# Patient Record
Sex: Male | Born: 1950 | Race: White | Hispanic: No | Smoking: Current every day smoker
Health system: Southern US, Community
[De-identification: ages and names within clinical notes are randomized; demographics above are authoritative.]

## PROBLEM LIST (undated history)

## (undated) DIAGNOSIS — C220 Liver cell carcinoma: Secondary | ICD-10-CM

## (undated) DIAGNOSIS — Z85828 Personal history of other malignant neoplasm of skin: Secondary | ICD-10-CM

## (undated) DIAGNOSIS — R32 Unspecified urinary incontinence: Secondary | ICD-10-CM

## (undated) DIAGNOSIS — B1920 Unspecified viral hepatitis C without hepatic coma: Secondary | ICD-10-CM

## (undated) DIAGNOSIS — Z7901 Long term (current) use of anticoagulants: Secondary | ICD-10-CM

## (undated) DIAGNOSIS — R0600 Dyspnea, unspecified: Secondary | ICD-10-CM

## (undated) DIAGNOSIS — I219 Acute myocardial infarction, unspecified: Secondary | ICD-10-CM

## (undated) DIAGNOSIS — I10 Essential (primary) hypertension: Secondary | ICD-10-CM

## (undated) DIAGNOSIS — R600 Localized edema: Secondary | ICD-10-CM

## (undated) DIAGNOSIS — Z955 Presence of coronary angioplasty implant and graft: Secondary | ICD-10-CM

## (undated) DIAGNOSIS — J439 Emphysema, unspecified: Secondary | ICD-10-CM

## (undated) DIAGNOSIS — I2510 Atherosclerotic heart disease of native coronary artery without angina pectoris: Secondary | ICD-10-CM

## (undated) DIAGNOSIS — I252 Old myocardial infarction: Secondary | ICD-10-CM

## (undated) DIAGNOSIS — Z923 Personal history of irradiation: Secondary | ICD-10-CM

## (undated) DIAGNOSIS — R058 Other specified cough: Secondary | ICD-10-CM

## (undated) DIAGNOSIS — Z9981 Dependence on supplemental oxygen: Secondary | ICD-10-CM

## (undated) DIAGNOSIS — R0609 Other forms of dyspnea: Secondary | ICD-10-CM

## (undated) DIAGNOSIS — Z972 Presence of dental prosthetic device (complete) (partial): Secondary | ICD-10-CM

## (undated) DIAGNOSIS — C3492 Malignant neoplasm of unspecified part of left bronchus or lung: Secondary | ICD-10-CM

## (undated) DIAGNOSIS — I77811 Abdominal aortic ectasia: Secondary | ICD-10-CM

## (undated) DIAGNOSIS — R05 Cough: Secondary | ICD-10-CM

## (undated) DIAGNOSIS — Z973 Presence of spectacles and contact lenses: Secondary | ICD-10-CM

## (undated) DIAGNOSIS — E785 Hyperlipidemia, unspecified: Secondary | ICD-10-CM

---

## 2001-12-29 ENCOUNTER — Ambulatory Visit (HOSPITAL_COMMUNITY): Admission: RE | Admit: 2001-12-29 | Discharge: 2001-12-29 | Payer: Self-pay | Admitting: Family Medicine

## 2001-12-29 ENCOUNTER — Encounter: Payer: Self-pay | Admitting: Family Medicine

## 2003-01-20 ENCOUNTER — Ambulatory Visit (HOSPITAL_COMMUNITY): Admission: RE | Admit: 2003-01-20 | Discharge: 2003-01-20 | Payer: Self-pay | Admitting: General Surgery

## 2003-11-11 ENCOUNTER — Ambulatory Visit (HOSPITAL_COMMUNITY): Admission: RE | Admit: 2003-11-11 | Discharge: 2003-11-11 | Payer: Self-pay | Admitting: Internal Medicine

## 2003-11-16 ENCOUNTER — Ambulatory Visit (HOSPITAL_COMMUNITY): Admission: RE | Admit: 2003-11-16 | Discharge: 2003-11-16 | Payer: Self-pay | Admitting: Internal Medicine

## 2010-01-08 ENCOUNTER — Ambulatory Visit (HOSPITAL_COMMUNITY)
Admission: RE | Admit: 2010-01-08 | Discharge: 2010-01-08 | Payer: Self-pay | Source: Home / Self Care | Admitting: Internal Medicine

## 2010-05-06 ENCOUNTER — Encounter: Payer: Self-pay | Admitting: Internal Medicine

## 2010-08-31 NOTE — H&P (Signed)
   NAME:  Adam Bass, HOLTE NO.:  [id number]   MEDICAL RECORD NO.:  [id number]                  PATIENT TYPE:   LOCATION:                                       FACILITY:   PHYSICIAN:  Dalia Heading, M.D.               DATE OF BIRTH:  [birth year]   DATE OF ADMISSION:  DATE OF DISCHARGE:                                HISTORY & PHYSICAL   CHIEF COMPLAINT:  History of adenomatous polyp with dysplasia.   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  He did have a colonoscopy in 2001 which was negative.  He  denies any abdominal pain, weight loss, nausea, vomiting, diarrhea,  constipation, melena or hematochezia.  There is no family history of colon  carcinoma.   PAST MEDICAL HISTORY:  Unremarkable.   PAST SURGICAL HISTORY:  As noted above.   CURRENT MEDICATIONS:  None.   ALLERGIES:  No known drug allergies.   SOCIAL HISTORY:  The patient does smoke a pack of cigarettes a day.  Does  drink alcohol daily.   REVIEW OF SYSTEMS:  No other cardiopulmonary difficulties are noted.   PHYSICAL EXAMINATION:  GENERAL:  Well-developed, well-nourished white male  in no acute distress.  He is afebrile.  VITAL SIGNS:  Stable.  LUNGS:  Clear to auscultation with equal breath sounds bilaterally.  HEART:  Regular rate and rhythm without S3, S4 or murmurs.  ABDOMEN:  Soft, nontender, nondistended.  No hepatosplenomegaly or masses  are noted.  RECTAL:  Deferred until the procedure.   IMPRESSION:  History of colon polyps.    PLAN:  The patient is scheduled for a colonoscopy on January 20, 2003.  The  risks and benefits of the procedure including bleeding and perforation were  explained to the patient and getting informed consent.      ___________________________________________                                         Dalia Heading, M.D.   MAJ/MEDQ  D:  01/13/2003  T:  01/13/2003  Job:  [id number]   cc:   Kirk Ruths, M.D.  [address]  [place]  [place] [address]  Fax: [phone]

## 2010-08-31 NOTE — Procedures (Signed)
NAME:  Adam Bass, Adam Bass                        ACCOUNT NO.:  [id number]   MEDICAL RECORD NO.:  [id number]                   PATIENT TYPE:  OUT   LOCATION:  RAD                                  FACILITY:  [facility]   PHYSICIAN:  Dani Gobble, MD                    DATE OF BIRTH:  [birth year]   DATE OF PROCEDURE:  11/16/2003  DATE OF DISCHARGE:                                  ECHOCARDIOGRAM   REFERRING PHYSICIAN:  1. Madelin Rear. Sherwood Gambler, M.D.  2. Dani Gobble, MD   INDICATIONS:  Mr. Tinnon is a 60 year old gentleman with a history of TIA's  without prior cardiac history.   The technical quality of the study was adequate.   The aorta was within normal limits at 2.7 cm.   The left atrium is within normal limits at 3.7 cm.  No obvious clots or  masses were appreciated.  The patient appeared to be in sinus rhythm during  this procedure.   The intraventricular septum and posterior wall were within normal limits at  1.1 cm for each.   The aortic valve appears thin, trileaflet, and pliable with normal leaflet  excursion.  No significant aortic insufficiency is noted.  Doppler  interrogation of the aortic valve is within normal limits.   Mitral valve also appears structurally normal with trivial mitral  regurgitation noted.  No mitral valve prolapse is noted.  A Doppler  interrogation of the mitral valve is within normal limits.   Pulmonic valve was incompletely visualized.   The tricuspid valve appears grossly structurally normal with trivial  tricuspid regurgitation noted.   The left ventricle is all one size with the LVIDD measured at 4.2 cm, and  LVISD measured at 3.1 cm.  Overall left ventricular systolic function is  normal and no regional wall motion abnormalities are noted.  The right-sided  structures appear normal.   IMPRESSION:  1. Normal chamber sizes.  2. Normal left ventricular size and systolic function without regional wall     motion abnormality noted.  3. Trivial  tricuspid and mitral regurgitation.  4. Essentially normal echocardiogram.      ___________________________________________                                            Dani Gobble, MD   AB/MEDQ  D:  11/16/2003  T:  11/16/2003  Job:  [id number]   cc:   Madelin Rear. Sherwood Gambler, M.D.  [address]  [place]  [place] [address]  Fax: [phone]   Dani Gobble, MD  Fax: [phone]

## 2010-12-31 ENCOUNTER — Other Ambulatory Visit (HOSPITAL_COMMUNITY): Payer: Self-pay | Admitting: Family Medicine

## 2010-12-31 DIAGNOSIS — N400 Enlarged prostate without lower urinary tract symptoms: Secondary | ICD-10-CM

## 2010-12-31 DIAGNOSIS — J449 Chronic obstructive pulmonary disease, unspecified: Secondary | ICD-10-CM

## 2010-12-31 DIAGNOSIS — R945 Abnormal results of liver function studies: Secondary | ICD-10-CM

## 2011-01-04 ENCOUNTER — Ambulatory Visit (HOSPITAL_COMMUNITY)
Admission: RE | Admit: 2011-01-04 | Discharge: 2011-01-04 | Disposition: A | Discharge status: Home / Self Care | Payer: BC Managed Care – PPO | Source: Ambulatory Visit | Attending: Family Medicine | Admitting: Family Medicine

## 2011-01-04 DIAGNOSIS — J4489 Other specified chronic obstructive pulmonary disease: Secondary | ICD-10-CM | POA: Insufficient documentation

## 2011-01-04 DIAGNOSIS — R350 Frequency of micturition: Secondary | ICD-10-CM | POA: Insufficient documentation

## 2011-01-04 DIAGNOSIS — J449 Chronic obstructive pulmonary disease, unspecified: Secondary | ICD-10-CM | POA: Insufficient documentation

## 2011-01-04 DIAGNOSIS — N400 Enlarged prostate without lower urinary tract symptoms: Secondary | ICD-10-CM | POA: Insufficient documentation

## 2011-01-04 DIAGNOSIS — R945 Abnormal results of liver function studies: Secondary | ICD-10-CM | POA: Insufficient documentation

## 2011-05-08 ENCOUNTER — Other Ambulatory Visit (HOSPITAL_COMMUNITY): Payer: Self-pay | Admitting: Internal Medicine

## 2011-05-08 ENCOUNTER — Ambulatory Visit (HOSPITAL_COMMUNITY)
Admission: RE | Admit: 2011-05-08 | Discharge: 2011-05-08 | Disposition: A | Discharge status: Home / Self Care | Payer: BC Managed Care – PPO | Source: Ambulatory Visit | Attending: Internal Medicine | Admitting: Internal Medicine

## 2011-05-08 DIAGNOSIS — J069 Acute upper respiratory infection, unspecified: Secondary | ICD-10-CM

## 2011-05-08 DIAGNOSIS — R059 Cough, unspecified: Secondary | ICD-10-CM

## 2011-05-08 DIAGNOSIS — R05 Cough: Secondary | ICD-10-CM

## 2012-07-15 ENCOUNTER — Encounter (INDEPENDENT_AMBULATORY_CARE_PROVIDER_SITE_OTHER): Payer: Self-pay | Admitting: *Deleted

## 2012-07-21 ENCOUNTER — Other Ambulatory Visit (INDEPENDENT_AMBULATORY_CARE_PROVIDER_SITE_OTHER): Payer: Self-pay | Admitting: *Deleted

## 2012-07-21 ENCOUNTER — Telehealth (INDEPENDENT_AMBULATORY_CARE_PROVIDER_SITE_OTHER): Payer: Self-pay | Admitting: *Deleted

## 2012-07-21 ENCOUNTER — Encounter (INDEPENDENT_AMBULATORY_CARE_PROVIDER_SITE_OTHER): Payer: Self-pay | Admitting: Internal Medicine

## 2012-07-21 ENCOUNTER — Ambulatory Visit (INDEPENDENT_AMBULATORY_CARE_PROVIDER_SITE_OTHER): Payer: BC Managed Care – PPO | Admitting: Internal Medicine

## 2012-07-21 ENCOUNTER — Encounter (INDEPENDENT_AMBULATORY_CARE_PROVIDER_SITE_OTHER): Payer: Self-pay | Admitting: *Deleted

## 2012-07-21 VITALS — BP 126/72 | HR 88 | Ht 69.0 in | Wt 154.8 lb

## 2012-07-21 DIAGNOSIS — J449 Chronic obstructive pulmonary disease, unspecified: Secondary | ICD-10-CM | POA: Insufficient documentation

## 2012-07-21 DIAGNOSIS — Z1211 Encounter for screening for malignant neoplasm of colon: Secondary | ICD-10-CM

## 2012-07-21 NOTE — Telephone Encounter (Signed)
Patient needs movi prep 

## 2012-07-21 NOTE — Progress Notes (Addendum)
Subjective:     Patient ID: Adam Bass, male   DOB: 1951/02/26, 62 y.o.   MRN: [id number]  HPIReferred to our office for diarrhea/colonoscopy. He tells me when he saw Dr. Regino Schultze he had a stomach virus and was having diarrhea.Symptoms resolved after 3 days. His last colonoscopy in 2004 was by Dr. Lovell Sheehan and was normal except for small external hemorrhoids. No polyps or masses.   Appetite is good. No weight loss. No abdominal pain.  He has a BM one a day. No melena or bright red rectal bleeding. No GI problems.  Empirically treated with Cipro and Flagyl. Stool culture negative, Ova and Para negative, C diff negative, Lactoferrin positive.  06/22/2012 H and H 18.0 and 49.3, MCV 100, Platelet ct 288  AST 118, ALT 133. Review of Systems see hpi Current Outpatient Prescriptions  Medication Sig Dispense Refill  . aspirin 81 MG chewable tablet Chew 81 mg by mouth daily.      Marland Kitchen tiotropium (SPIRIVA HANDIHALER) 18 MCG inhalation capsule Place 18 mcg into inhaler and inhale daily.      Marland Kitchen albuterol (PROAIR HFA) 108 (90 BASE) MCG/ACT inhaler Inhale 2 puffs into the lungs every 6 (six) hours as needed for wheezing.       No current facility-administered medications for this visit.   Past Medical History  Diagnosis Date  . COPD (chronic obstructive pulmonary disease)    History reviewed. No pertinent past surgical history. No Known Allergies      Objective:   Physical Exam  Filed Vitals:   07/21/12 1506  BP: 126/72  Pulse: 88  Height: 5\' 9"  (1.753 m)  Weight: 154 lb 12.8 oz (70.217 kg)  Alert and oriented. Skin warm and dry. Oral mucosa is moist.   . Sclera anicteric, conjunctivae is pink. Thyroid not enlarged. No cervical lymphadenopathy. Lungs clear. Heart regular rate and rhythm.  Abdomen is soft. Bowel sounds are positive. No hepatomegaly. No abdominal masses felt. No tenderness.  No edema to lower extremities.        Assessment:   No GI problems. In need of screening  colonoscopy    Plan:   Screening colonoscopy

## 2012-07-21 NOTE — Patient Instructions (Addendum)
Screening colonoscopy 

## 2012-07-22 MED ORDER — PEG-KCL-NACL-NASULF-NA ASC-C 100 G PO SOLR: 1.0000 | Freq: Once | ORAL | Status: DC

## 2012-07-28 ENCOUNTER — Encounter (INDEPENDENT_AMBULATORY_CARE_PROVIDER_SITE_OTHER): Payer: Self-pay

## 2012-08-17 ENCOUNTER — Encounter (HOSPITAL_COMMUNITY): Payer: Self-pay | Admitting: Pharmacy Technician

## 2012-08-27 ENCOUNTER — Encounter (HOSPITAL_COMMUNITY)
Admission: RE | Disposition: A | Discharge status: Home / Self Care | Payer: Self-pay | Source: Ambulatory Visit | Attending: Internal Medicine

## 2012-08-27 ENCOUNTER — Encounter (HOSPITAL_COMMUNITY): Payer: Self-pay | Admitting: *Deleted

## 2012-08-27 ENCOUNTER — Ambulatory Visit (HOSPITAL_COMMUNITY)
Admission: RE | Admit: 2012-08-27 | Discharge: 2012-08-27 | Disposition: A | Discharge status: Home / Self Care | Payer: BC Managed Care – PPO | Source: Ambulatory Visit | Attending: Internal Medicine | Admitting: Internal Medicine

## 2012-08-27 DIAGNOSIS — D126 Benign neoplasm of colon, unspecified: Secondary | ICD-10-CM | POA: Insufficient documentation

## 2012-08-27 DIAGNOSIS — Z1211 Encounter for screening for malignant neoplasm of colon: Secondary | ICD-10-CM

## 2012-08-27 DIAGNOSIS — K5730 Diverticulosis of large intestine without perforation or abscess without bleeding: Secondary | ICD-10-CM

## 2012-08-27 DIAGNOSIS — J449 Chronic obstructive pulmonary disease, unspecified: Secondary | ICD-10-CM | POA: Insufficient documentation

## 2012-08-27 DIAGNOSIS — J4489 Other specified chronic obstructive pulmonary disease: Secondary | ICD-10-CM | POA: Insufficient documentation

## 2012-08-27 HISTORY — PX: COLONOSCOPY: SHX5424

## 2012-08-27 SURGERY — COLONOSCOPY
Anesthesia: Moderate Sedation

## 2012-08-27 MED ORDER — SODIUM CHLORIDE 0.9 % IV SOLN
INTRAVENOUS | Status: DC
  Administered 2012-08-27: 11:00:00 via INTRAVENOUS

## 2012-08-27 MED ORDER — MIDAZOLAM HCL 5 MG/5ML IJ SOLN
INTRAMUSCULAR | Status: AC
  Filled 2012-08-27: qty 10

## 2012-08-27 MED ORDER — STERILE WATER FOR IRRIGATION IR SOLN
Status: DC | PRN
  Administered 2012-08-27: 11:00:00

## 2012-08-27 MED ORDER — MIDAZOLAM HCL 5 MG/5ML IJ SOLN
INTRAMUSCULAR | Status: DC | PRN
  Administered 2012-08-27: 2 mg via INTRAVENOUS
  Administered 2012-08-27: 1 mg via INTRAVENOUS
  Administered 2012-08-27: 2 mg via INTRAVENOUS
  Administered 2012-08-27: 1 mg via INTRAVENOUS

## 2012-08-27 MED ORDER — MEPERIDINE HCL 50 MG/ML IJ SOLN
INTRAMUSCULAR | Status: DC | PRN
  Administered 2012-08-27 (×2): 25 mg via INTRAVENOUS

## 2012-08-27 MED ORDER — MEPERIDINE HCL 50 MG/ML IJ SOLN
INTRAMUSCULAR | Status: AC
  Filled 2012-08-27: qty 1

## 2012-08-27 NOTE — Op Note (Signed)
COLONOSCOPY PROCEDURE REPORT  PATIENT:  Adam Bass  MR#:  [id number] Birthdate:  1950-06-27, 62 y.o., male Endoscopist:  Dr. Malissa Hippo, MD Referred By:  Dr. Kirk Ruths, MD Procedure Date: 08/27/2012  Procedure:   Colonoscopy with snare polypectomy.  Indications:  Patient is 62 year-old Caucasian male who is here for average risk screening colonoscopy.  Informed Consent:  The procedure and risks were reviewed with the patient and informed consent was obtained.  Medications:  Demerol 50 mg IV Versed 6 mg IV  Description of procedure:  After a digital rectal exam was performed, that colonoscope was advanced from the anus through the rectum and colon to the area of the cecum, ileocecal valve and appendiceal orifice. The cecum was deeply intubated. These structures were well-seen and photographed for the record. From the level of the cecum and ileocecal valve, the scope was slowly and cautiously withdrawn. The mucosal surfaces were carefully surveyed utilizing scope tip to flexion to facilitate fold flattening as needed. The scope was pulled down into the rectum where a thorough exam including retroflexion was performed.  Findings:   Prep excellent. Three small polyps cold snare from proximal sigmoid colon. Two were retrieved and one was lost. 7 mm sessile polyp snared from distal sigmoid colon. Few small diverticula at sigmoid colon. Normal mucosa of rectum and anorectal junction.   Therapeutic/Diagnostic Maneuvers Performed:  See above  Complications:  None  Cecal Withdrawal Time:  22 minutes  Impression:  Examination performed to cecum. Few small diverticula at sigmoid colon. Three small polyps cold snare from proximal sigmoid colon. Two were retrieved and submitted together; third one was lost. 7 mm Sessile polyp snared from distal colon.  Recommendations:  Standard instructions given. I will contact patient with biopsy results and further  recommendations.  Ming Mcmannis U  08/27/2012 12:08 PM  CC: Dr. Kirk Ruths, MD & Dr. Bonnetta Barry ref. provider found

## 2012-08-27 NOTE — H&P (Signed)
Adam Bass is an 62 y.o. male.   Chief Complaint: Patient is here for colonoscopy. HPI: Patient 62 year old Caucasian male who is here for screening colonoscopy. Patient denies abdominal pain change in bowel habits or rectal bleeding. His last colonoscopy was over 10 years ago. Family history is negative for carcinoma. Past Medical History  Diagnosis Date  . COPD (chronic obstructive pulmonary disease)     Past Surgical History  Procedure Laterality Date  . Colonoscopy  2004    Dr. Jola Schmidt    Family History  Problem Relation Age of Onset  . Breast cancer Sister   . Colon cancer Neg Hx    Social History:  reports that he has been smoking Cigars.  He does not have any smokeless tobacco history on file. He reports that  drinks alcohol. He reports that he does not use illicit drugs.  Allergies:  Allergies  Allergen Reactions  . Bee Venom     Medications Prior to Admission  Medication Sig Dispense Refill  . albuterol (PROAIR HFA) 108 (90 BASE) MCG/ACT inhaler Inhale 2 puffs into the lungs every 6 (six) hours as needed for wheezing.      Marland Kitchen aspirin 81 MG chewable tablet Chew 81 mg by mouth daily.      . peg 3350 powder (MOVIPREP) 100 G SOLR Take 1 kit (100 g total) by mouth once.  1 kit  0  . tiotropium (SPIRIVA HANDIHALER) 18 MCG inhalation capsule Place 18 mcg into inhaler and inhale daily.        No results found for this or any previous visit (from the past 48 hour(s)). No results found.  ROS  Blood pressure 139/89, temperature 97.8 F (36.6 C), temperature source Oral, resp. rate 19, height 5\' 9"  (1.753 m), weight 150 lb (68.04 kg), SpO2 96.00%. Physical Exam  Constitutional: He appears well-developed and well-nourished.  HENT:  Mouth/Throat: Oropharynx is clear and moist.  Eyes: Conjunctivae are normal. No scleral icterus.  Neck: No thyromegaly present.  Cardiovascular: Normal rate, regular rhythm and normal heart sounds.   No murmur heard. Respiratory:  Effort normal and breath sounds normal.  GI: Soft. He exhibits no distension and no mass. There is no tenderness.  Musculoskeletal: He exhibits no edema.  Lymphadenopathy:    He has no cervical adenopathy.  Neurological: He is alert.  Skin: Skin is warm and dry.     Assessment/Plan Average risk screening colonoscopy.  Divit Stipp U 08/27/2012, 11:19 AM

## 2012-08-31 ENCOUNTER — Encounter (HOSPITAL_COMMUNITY): Payer: Self-pay | Admitting: Internal Medicine

## 2012-09-10 ENCOUNTER — Encounter (INDEPENDENT_AMBULATORY_CARE_PROVIDER_SITE_OTHER): Payer: Self-pay | Admitting: *Deleted

## 2014-04-11 ENCOUNTER — Other Ambulatory Visit (HOSPITAL_COMMUNITY): Payer: Self-pay | Admitting: Respiratory Therapy

## 2014-04-11 DIAGNOSIS — J449 Chronic obstructive pulmonary disease, unspecified: Secondary | ICD-10-CM

## 2014-04-20 ENCOUNTER — Ambulatory Visit (HOSPITAL_COMMUNITY): Admission: RE | Admit: 2014-04-20 | Payer: Self-pay | Source: Ambulatory Visit

## 2014-04-21 ENCOUNTER — Ambulatory Visit (HOSPITAL_COMMUNITY)
Admission: RE | Admit: 2014-04-21 | Discharge: 2014-04-21 | Disposition: A | Discharge status: Home / Self Care | Payer: BLUE CROSS/BLUE SHIELD | Source: Ambulatory Visit | Attending: Family Medicine | Admitting: Family Medicine

## 2014-04-21 ENCOUNTER — Other Ambulatory Visit (HOSPITAL_COMMUNITY): Payer: Self-pay | Admitting: Family Medicine

## 2014-04-21 DIAGNOSIS — R059 Cough, unspecified: Secondary | ICD-10-CM

## 2014-04-21 DIAGNOSIS — R05 Cough: Secondary | ICD-10-CM | POA: Diagnosis present

## 2014-04-21 DIAGNOSIS — I700 Atherosclerosis of aorta: Secondary | ICD-10-CM | POA: Diagnosis not present

## 2014-04-21 DIAGNOSIS — J984 Other disorders of lung: Secondary | ICD-10-CM | POA: Insufficient documentation

## 2014-04-21 DIAGNOSIS — J019 Acute sinusitis, unspecified: Secondary | ICD-10-CM

## 2014-11-25 ENCOUNTER — Other Ambulatory Visit (HOSPITAL_COMMUNITY): Payer: Self-pay | Admitting: Physician Assistant

## 2014-11-25 DIAGNOSIS — R6889 Other general symptoms and signs: Secondary | ICD-10-CM

## 2014-11-25 DIAGNOSIS — R748 Abnormal levels of other serum enzymes: Secondary | ICD-10-CM

## 2014-11-25 DIAGNOSIS — K7290 Hepatic failure, unspecified without coma: Secondary | ICD-10-CM

## 2014-11-29 ENCOUNTER — Other Ambulatory Visit (HOSPITAL_COMMUNITY): Payer: BLUE CROSS/BLUE SHIELD

## 2014-11-30 ENCOUNTER — Ambulatory Visit (HOSPITAL_COMMUNITY)
Admission: RE | Admit: 2014-11-30 | Discharge: 2014-11-30 | Disposition: A | Discharge status: Home / Self Care | Payer: BLUE CROSS/BLUE SHIELD | Source: Ambulatory Visit | Attending: Physician Assistant | Admitting: Physician Assistant

## 2014-11-30 DIAGNOSIS — K7290 Hepatic failure, unspecified without coma: Secondary | ICD-10-CM

## 2014-11-30 DIAGNOSIS — N281 Cyst of kidney, acquired: Secondary | ICD-10-CM | POA: Insufficient documentation

## 2014-11-30 DIAGNOSIS — I2510 Atherosclerotic heart disease of native coronary artery without angina pectoris: Secondary | ICD-10-CM | POA: Diagnosis not present

## 2014-11-30 DIAGNOSIS — K7460 Unspecified cirrhosis of liver: Secondary | ICD-10-CM | POA: Diagnosis not present

## 2014-11-30 DIAGNOSIS — K760 Fatty (change of) liver, not elsewhere classified: Secondary | ICD-10-CM | POA: Diagnosis not present

## 2014-11-30 DIAGNOSIS — R748 Abnormal levels of other serum enzymes: Secondary | ICD-10-CM | POA: Diagnosis present

## 2014-11-30 DIAGNOSIS — R6889 Other general symptoms and signs: Secondary | ICD-10-CM

## 2014-11-30 MED ORDER — IOHEXOL 300 MG/ML  SOLN
100.0000 mL | Freq: Once | INTRAMUSCULAR | Status: AC | PRN
  Administered 2014-11-30: 100 mL via INTRAVENOUS

## 2014-12-14 ENCOUNTER — Other Ambulatory Visit (HOSPITAL_COMMUNITY): Payer: Self-pay | Admitting: Physician Assistant

## 2014-12-14 DIAGNOSIS — K760 Fatty (change of) liver, not elsewhere classified: Secondary | ICD-10-CM

## 2014-12-14 DIAGNOSIS — R6889 Other general symptoms and signs: Secondary | ICD-10-CM

## 2014-12-14 DIAGNOSIS — R748 Abnormal levels of other serum enzymes: Secondary | ICD-10-CM

## 2014-12-16 ENCOUNTER — Other Ambulatory Visit (HOSPITAL_COMMUNITY): Payer: BLUE CROSS/BLUE SHIELD

## 2014-12-27 ENCOUNTER — Other Ambulatory Visit (HOSPITAL_COMMUNITY): Payer: BLUE CROSS/BLUE SHIELD

## 2014-12-28 ENCOUNTER — Ambulatory Visit (HOSPITAL_COMMUNITY)
Admission: RE | Admit: 2014-12-28 | Discharge: 2014-12-28 | Disposition: A | Discharge status: Home / Self Care | Payer: BLUE CROSS/BLUE SHIELD | Source: Ambulatory Visit | Attending: Physician Assistant | Admitting: Physician Assistant

## 2014-12-28 DIAGNOSIS — K862 Cyst of pancreas: Secondary | ICD-10-CM | POA: Insufficient documentation

## 2014-12-28 DIAGNOSIS — K760 Fatty (change of) liver, not elsewhere classified: Secondary | ICD-10-CM

## 2014-12-28 DIAGNOSIS — N281 Cyst of kidney, acquired: Secondary | ICD-10-CM | POA: Insufficient documentation

## 2014-12-28 DIAGNOSIS — R934 Abnormal findings on diagnostic imaging of urinary organs: Secondary | ICD-10-CM | POA: Insufficient documentation

## 2014-12-28 DIAGNOSIS — R932 Abnormal findings on diagnostic imaging of liver and biliary tract: Secondary | ICD-10-CM | POA: Diagnosis present

## 2014-12-28 LAB — POCT I-STAT CREATININE: Creatinine, Ser: 0.8 mg/dL (ref 0.61–1.24)

## 2014-12-28 MED ORDER — GADOXETATE DISODIUM 0.25 MMOL/ML IV SOLN
7.0000 mL | Freq: Once | INTRAVENOUS | Status: AC | PRN
  Administered 2014-12-28: 7 mL via INTRAVENOUS

## 2016-05-02 ENCOUNTER — Other Ambulatory Visit (HOSPITAL_COMMUNITY): Payer: Self-pay | Admitting: Family Medicine

## 2016-05-02 DIAGNOSIS — K8689 Other specified diseases of pancreas: Secondary | ICD-10-CM

## 2016-05-02 DIAGNOSIS — R935 Abnormal findings on diagnostic imaging of other abdominal regions, including retroperitoneum: Secondary | ICD-10-CM

## 2016-09-02 ENCOUNTER — Ambulatory Visit (HOSPITAL_COMMUNITY)
Admission: RE | Admit: 2016-09-02 | Discharge: 2016-09-02 | Disposition: A | Discharge status: Home / Self Care | Payer: BLUE CROSS/BLUE SHIELD | Source: Ambulatory Visit | Attending: Pulmonary Disease | Admitting: Pulmonary Disease

## 2016-09-02 ENCOUNTER — Other Ambulatory Visit (HOSPITAL_COMMUNITY): Payer: Self-pay | Admitting: Pulmonary Disease

## 2016-09-02 DIAGNOSIS — R918 Other nonspecific abnormal finding of lung field: Secondary | ICD-10-CM | POA: Insufficient documentation

## 2016-09-02 DIAGNOSIS — R05 Cough: Secondary | ICD-10-CM | POA: Insufficient documentation

## 2016-09-02 DIAGNOSIS — R059 Cough, unspecified: Secondary | ICD-10-CM

## 2016-09-04 ENCOUNTER — Other Ambulatory Visit (HOSPITAL_COMMUNITY): Payer: Self-pay | Admitting: Pulmonary Disease

## 2016-09-04 DIAGNOSIS — R918 Other nonspecific abnormal finding of lung field: Secondary | ICD-10-CM

## 2016-09-05 ENCOUNTER — Ambulatory Visit (HOSPITAL_COMMUNITY)
Admission: RE | Admit: 2016-09-05 | Discharge: 2016-09-05 | Disposition: A | Discharge status: Home / Self Care | Payer: BLUE CROSS/BLUE SHIELD | Source: Ambulatory Visit | Attending: Pulmonary Disease | Admitting: Pulmonary Disease

## 2016-09-05 DIAGNOSIS — J439 Emphysema, unspecified: Secondary | ICD-10-CM | POA: Diagnosis not present

## 2016-09-05 DIAGNOSIS — R918 Other nonspecific abnormal finding of lung field: Secondary | ICD-10-CM | POA: Insufficient documentation

## 2016-09-05 DIAGNOSIS — N281 Cyst of kidney, acquired: Secondary | ICD-10-CM | POA: Diagnosis not present

## 2016-09-19 ENCOUNTER — Other Ambulatory Visit (HOSPITAL_COMMUNITY): Payer: Self-pay | Admitting: Pulmonary Disease

## 2016-09-19 DIAGNOSIS — R918 Other nonspecific abnormal finding of lung field: Secondary | ICD-10-CM

## 2016-09-25 ENCOUNTER — Encounter (HOSPITAL_COMMUNITY): Payer: BLUE CROSS/BLUE SHIELD

## 2016-09-27 ENCOUNTER — Encounter (HOSPITAL_COMMUNITY): Payer: Self-pay

## 2016-09-27 ENCOUNTER — Ambulatory Visit (HOSPITAL_COMMUNITY)
Admission: RE | Admit: 2016-09-27 | Discharge: 2016-09-27 | Disposition: A | Discharge status: Home / Self Care | Payer: BLUE CROSS/BLUE SHIELD | Source: Ambulatory Visit | Attending: Pulmonary Disease | Admitting: Pulmonary Disease

## 2016-09-27 DIAGNOSIS — R918 Other nonspecific abnormal finding of lung field: Secondary | ICD-10-CM

## 2016-10-02 ENCOUNTER — Ambulatory Visit (HOSPITAL_COMMUNITY)
Admission: RE | Admit: 2016-10-02 | Discharge: 2016-10-02 | Disposition: A | Discharge status: Home / Self Care | Payer: BLUE CROSS/BLUE SHIELD | Source: Ambulatory Visit | Attending: Pulmonary Disease | Admitting: Pulmonary Disease

## 2016-10-02 DIAGNOSIS — R918 Other nonspecific abnormal finding of lung field: Secondary | ICD-10-CM | POA: Diagnosis not present

## 2016-10-02 DIAGNOSIS — R932 Abnormal findings on diagnostic imaging of liver and biliary tract: Secondary | ICD-10-CM | POA: Insufficient documentation

## 2016-10-02 DIAGNOSIS — I723 Aneurysm of iliac artery: Secondary | ICD-10-CM | POA: Diagnosis not present

## 2016-10-02 DIAGNOSIS — I2510 Atherosclerotic heart disease of native coronary artery without angina pectoris: Secondary | ICD-10-CM | POA: Diagnosis not present

## 2016-10-02 DIAGNOSIS — I700 Atherosclerosis of aorta: Secondary | ICD-10-CM | POA: Insufficient documentation

## 2016-10-02 LAB — GLUCOSE, CAPILLARY: Glucose-Capillary: 112 mg/dL — ABNORMAL HIGH (ref 65–99)

## 2016-10-02 MED ORDER — FLUDEOXYGLUCOSE F - 18 (FDG) INJECTION
7.7200 | Freq: Once | INTRAVENOUS | Status: AC | PRN
  Administered 2016-10-02: 7.72 via INTRAVENOUS

## 2016-10-03 ENCOUNTER — Other Ambulatory Visit (HOSPITAL_COMMUNITY): Payer: Self-pay | Admitting: Pulmonary Disease

## 2016-10-03 DIAGNOSIS — R918 Other nonspecific abnormal finding of lung field: Secondary | ICD-10-CM

## 2016-10-17 ENCOUNTER — Other Ambulatory Visit: Payer: Self-pay | Admitting: Radiology

## 2016-10-18 ENCOUNTER — Ambulatory Visit (HOSPITAL_COMMUNITY)
Admission: RE | Admit: 2016-10-18 | Discharge: 2016-10-18 | Disposition: A | Discharge status: Home / Self Care | Payer: BLUE CROSS/BLUE SHIELD | Source: Ambulatory Visit | Attending: Pulmonary Disease | Admitting: Pulmonary Disease

## 2016-10-18 ENCOUNTER — Ambulatory Visit (HOSPITAL_COMMUNITY)
Admission: RE | Admit: 2016-10-18 | Discharge: 2016-10-18 | Disposition: A | Discharge status: Home / Self Care | Payer: BLUE CROSS/BLUE SHIELD | Source: Ambulatory Visit | Attending: Interventional Radiology | Admitting: Interventional Radiology

## 2016-10-18 ENCOUNTER — Encounter (HOSPITAL_COMMUNITY): Payer: Self-pay

## 2016-10-18 DIAGNOSIS — J95811 Postprocedural pneumothorax: Secondary | ICD-10-CM | POA: Insufficient documentation

## 2016-10-18 DIAGNOSIS — F17210 Nicotine dependence, cigarettes, uncomplicated: Secondary | ICD-10-CM | POA: Diagnosis not present

## 2016-10-18 DIAGNOSIS — J449 Chronic obstructive pulmonary disease, unspecified: Secondary | ICD-10-CM | POA: Insufficient documentation

## 2016-10-18 DIAGNOSIS — Z9889 Other specified postprocedural states: Secondary | ICD-10-CM

## 2016-10-18 DIAGNOSIS — Z7982 Long term (current) use of aspirin: Secondary | ICD-10-CM | POA: Diagnosis not present

## 2016-10-18 DIAGNOSIS — Z79899 Other long term (current) drug therapy: Secondary | ICD-10-CM | POA: Diagnosis not present

## 2016-10-18 DIAGNOSIS — R918 Other nonspecific abnormal finding of lung field: Secondary | ICD-10-CM | POA: Insufficient documentation

## 2016-10-18 DIAGNOSIS — J939 Pneumothorax, unspecified: Secondary | ICD-10-CM

## 2016-10-18 LAB — CBC
HEMATOCRIT: 47.3 % (ref 39.0–52.0)
Hemoglobin: 16.8 g/dL (ref 13.0–17.0)
MCH: 37.3 pg — AB (ref 26.0–34.0)
MCHC: 35.5 g/dL (ref 30.0–36.0)
MCV: 105.1 fL — AB (ref 78.0–100.0)
PLATELETS: 231 10*3/uL (ref 150–400)
RBC: 4.5 MIL/uL (ref 4.22–5.81)
RDW: 12 % (ref 11.5–15.5)
WBC: 9.9 10*3/uL (ref 4.0–10.5)

## 2016-10-18 LAB — APTT: aPTT: 30 seconds (ref 24–36)

## 2016-10-18 LAB — PROTIME-INR
INR: 0.99
Prothrombin Time: 13.1 seconds (ref 11.4–15.2)

## 2016-10-18 MED ORDER — MIDAZOLAM HCL 2 MG/2ML IJ SOLN
INTRAMUSCULAR | Status: AC
  Filled 2016-10-18: qty 6

## 2016-10-18 MED ORDER — FENTANYL CITRATE (PF) 100 MCG/2ML IJ SOLN
INTRAMUSCULAR | Status: AC
  Filled 2016-10-18: qty 4

## 2016-10-18 MED ORDER — LIDOCAINE HCL (PF) 1 % IJ SOLN
INTRAMUSCULAR | Status: AC
  Filled 2016-10-18: qty 30

## 2016-10-18 MED ORDER — SODIUM CHLORIDE 0.9 % IV SOLN: INTRAVENOUS | Status: DC

## 2016-10-18 MED ORDER — FLUMAZENIL 0.5 MG/5ML IV SOLN
INTRAVENOUS | Status: AC
  Filled 2016-10-18: qty 5

## 2016-10-18 MED ORDER — MIDAZOLAM HCL 2 MG/2ML IJ SOLN
INTRAMUSCULAR | Status: AC | PRN
  Administered 2016-10-18: 0.5 mg via INTRAVENOUS
  Administered 2016-10-18 (×3): 1 mg via INTRAVENOUS

## 2016-10-18 MED ORDER — FENTANYL CITRATE (PF) 100 MCG/2ML IJ SOLN
INTRAMUSCULAR | Status: AC | PRN
  Administered 2016-10-18 (×2): 50 ug via INTRAVENOUS

## 2016-10-18 MED ORDER — NALOXONE HCL 0.4 MG/ML IJ SOLN
INTRAMUSCULAR | Status: AC
  Filled 2016-10-18: qty 1

## 2016-10-18 MED ORDER — HYDROCODONE-ACETAMINOPHEN 5-325 MG PO TABS
1.0000 | ORAL_TABLET | ORAL | Status: DC | PRN
  Filled 2016-10-18: qty 2

## 2016-10-18 NOTE — Procedures (Signed)
CT core biopsy LUL lung lesion 18g x2 to surg path No complication No blood loss. See complete dictation in [facility].  Dillard Cannon MD Main # [phone] Pager  [phone]

## 2016-10-18 NOTE — Discharge Instructions (Signed)
°  CALL [phone] IF ANY PROBLEMS,QUESTIONS, OR CONCERNS  Needle Biopsy of the Lung, Care After This sheet gives you information about how to care for yourself after your procedure. Your health care provider may also give you more specific instructions. If you have problems or questions, contact your health care provider. What can I expect after the procedure? After the procedure, it is common to have:  Soreness, pain, and tenderness where a tissue sample was taken (biopsy site).  A cough.  A sore throat.  Follow these instructions at home: Biopsy site care  Follow instructions from your health care provider about when to remove the bandage that was placed on the biopsy site.  Keep the bandage dry until it has been removed.  Check your biopsy site every day for signs of infection. Check for: ? More redness, swelling, or pain. ? More fluid or blood. ? Warmth to the touch. ? Pus or a bad smell. General instructions  Rest as directed by your health care provider. Ask your health care provider what activities are safe for you.  Do not take baths, swim, or use a hot tub until your health care provider approves.  Take over-the-counter and prescription medicines only as told by your health care provider.  If you have airplane travel scheduled, talk with your health care provider about when it is safe for you to travel by airplane.  It is up to you to get the results of your procedure. Ask your health care provider, or the department that is doing the procedure, when your results will be ready.  Keep all follow-up visits as told by your health care provider. This is important. Contact a health care provider if:  You have more redness, swelling, or pain around your biopsy site.  You have more fluid or blood coming from your biopsy site.  Your biopsy site feels warm to the touch.  You have pus or a bad smell coming from your biopsy site.  You have a fever.  You have pain  that does not get better with medicine. Get help right away if:  You have problems breathing.  You have chest pain.  You cough up blood.  You faint.  You have a fast heart rate. Summary  After a needle biopsy of the lung, it is common to have a cough, a sore throat, or soreness, pain, and tenderness where a tissue sample was taken (biopsy site).  You should check your biopsy area every day for signs of infection, including pus or a bad smell, warmth, more fluid or blood, or more redness, swelling, or pain.  You should not take baths, swim, or use a hot tub until your health care provider approves.  It is up to you to get the results of your procedure. Ask your health care provider, or the department that is doing the procedure, when your results will be ready. This information is not intended to replace advice given to you by your health care provider. Make sure you discuss any questions you have with your health care provider. Document Released: 01/27/2007 Document Revised: 02/21/2016 Document Reviewed: 02/21/2016 Elsevier Interactive Patient Education  2017 [employer].

## 2016-10-18 NOTE — Progress Notes (Signed)
Patient with PTX after lung bx today.  This was small and has remained stable for 3 hours post biopsy.  The patient is having no more SOB now than what he normally has from his COPD.  I have discussed with Dr. Vernard Gambles and he agrees the patient is stable to be discharged home.  He is encouraged to present to the ED if he develops worsening pain or SOB.  He and his daughter understand.  Yaniel Limbaugh E 4:05 PM 10/18/2016

## 2016-10-18 NOTE — Progress Notes (Signed)
Called Dr Vernard Gambles about CXR results and per Dr Vernard Gambles OK to discharge at 1420 if no chest pain,shortness of breath or decreased O2 sats

## 2016-10-18 NOTE — Progress Notes (Signed)
Client dressed and walked to bathroom and c/o being short of breath; Dr Vernard Gambles notified and order noted

## 2016-10-18 NOTE — H&P (Signed)
Chief Complaint: Patient was seen in consultation today for left lung mass biopsy at the request of Hawkins,Edward  Referring Physician(s): Hawkins,Edward  Supervising Physician: Corrie Mckusick  Patient Status: [facility]  History of Present Illness: Adam Bass is a 66 y.o. male   ++ smoker Hx COPD Was seen by PMD for URI symptoms and CXR revealed left lung finding CT and PET performed CT: IMPRESSION: Somewhat spiculated nodular area in the left upper lobe which corresponds with that seen on recent chest x-ray. Given its size and spiculated appearance pulmonary neoplasm must be considered. PET: IMPRESSION: 1. Hypermetabolic left upper and left lower lobe nodules, most indicative of synchronous bronchogenic carcinomas. No evidence of metastatic disease. 2. Aortic atherosclerosis (ICD10-170.0). Coronary artery calcification. Right common iliac artery aneurysm. 3. Liver margin is slightly irregular, raising suspicion for cirrhosis. 4. There may be sludge in the gallbladder.  Now scheduled for biopsy of LUL mass biopsy  Past Medical History:  Diagnosis Date  . COPD (chronic obstructive pulmonary disease) ([place])     Past Surgical History:  Procedure Laterality Date  . COLONOSCOPY  2004   Dr. Truett Perna  . COLONOSCOPY N/A 08/27/2012   Procedure: COLONOSCOPY;  Surgeon: Rogene Houston, MD;  Location: [facility];  Service: Endoscopy;  Laterality: N/A;  1200    Allergies: Bee venom  Medications: Prior to Admission medications   Medication Sig Start Date End Date Taking? Authorizing Provider  albuterol (PROAIR HFA) 108 (90 BASE) MCG/ACT inhaler Inhale 2 puffs into the lungs every 6 (six) hours as needed for wheezing.   Yes [provider]  albuterol (PROVENTIL) 4 MG tablet Take 1 tablet by mouth 2 (two) times daily. 10/09/16  Yes [provider]  ANORO ELLIPTA 62.5-25 MCG/INH AEPB Inhale 1 puff into the lungs at bedtime. 10/10/16  Yes  [provider]  aspirin EC 81 MG tablet Take 81 mg by mouth daily.   Yes [provider]  montelukast (SINGULAIR) 10 MG tablet Take 10 mg by mouth at bedtime.   Yes [provider]  Multiple Vitamins-Minerals (MULTIVITAMIN WITH MINERALS) tablet Take 1 tablet by mouth daily.   Yes [provider]  pantoprazole (PROTONIX) 40 MG tablet Take 1 tablet by mouth every morning. 10/08/16  Yes [provider]     Family History  Problem Relation Age of Onset  . Breast cancer Sister   . Colon cancer Neg Hx     Social History   Social History  . Marital status: Divorced    Spouse name: N/A  . Number of children: N/A  . Years of education: N/A   Social History Main Topics  . Smoking status: Current Every Day Smoker    Packs/day: 1.00    Years: 45.00    Types: Cigars  . Smokeless tobacco: None     Comment: 1 pack of cigars a day  . Alcohol use Yes     Comment: 12-18 beers a week, sometimes more and sometimes less  . Drug use: No  . Sexual activity: Not Asked   Other Topics Concern  . None   Social History Narrative  . None    Review of Systems: A 12 point ROS discussed and pertinent positives are indicated in the HPI above.  All other systems are negative.  Review of Systems  Constitutional: Negative for activity change, appetite change, fatigue and fever.  Respiratory: Positive for cough and shortness of breath.   Cardiovascular: Negative for chest pain.  Gastrointestinal: Negative for abdominal pain.  Musculoskeletal: Negative for back pain.  Neurological: Negative for weakness.  Psychiatric/Behavioral: Negative for behavioral problems and confusion.    Vital Signs: BP (!) 154/72   Pulse 83   Temp 98.1 F (36.7 C)   Resp 18   Ht 5\' 9"  (1.753 m)   Wt 145 lb (65.8 kg)   SpO2 98%   BMI 21.41 kg/m   Physical Exam  Constitutional: He is oriented to person, place, and time.  Cardiovascular: Normal rate, regular rhythm and  normal heart sounds.   Pulmonary/Chest: Effort normal and breath sounds normal.  Abdominal: Soft. Bowel sounds are normal.  Musculoskeletal: Normal range of motion.  Neurological: He is alert and oriented to person, place, and time.  Skin: Skin is warm and dry.  Psychiatric: He has a normal mood and affect. His behavior is normal. Judgment and thought content normal.  Nursing note and vitals reviewed.   Mallampati Score:  MD Evaluation Airway: WNL Heart: WNL Abdomen: WNL Chest/ Lungs: WNL ASA  Classification: 3 Mallampati/Airway Score: One  Imaging: Nm Pet Image Initial (pi) Skull Base To Thigh  Result Date: 10/02/2016 CLINICAL DATA:  Initial treatment strategy for pulmonary nodule. EXAM: NUCLEAR MEDICINE PET SKULL BASE TO THIGH TECHNIQUE: 7.7 mCi F-18 FDG was injected intravenously. Full-ring PET imaging was performed from the skull base to thigh after the radiotracer. CT data was obtained and used for attenuation correction and anatomic localization. FASTING BLOOD GLUCOSE:  Value: The 112 mg/dl COMPARISON:  CT chest 09/05/2016, MR abdomen 12/28/2014 and CT abdomen 11/30/2014. FINDINGS: NECK No hypermetabolic lymph nodes in the neck. CT images show no acute findings. CHEST No hypermetabolic mediastinal, hilar or axillary lymph nodes. Spiculated nodule in the apical left upper lobe measures 1.9 cm with an SUV max left 4.5. 8 mm nodule in the left lower lobe (series 7, image 52), has an SUV max of 1.7. Atherosclerotic calcification of the arterial vasculature, including coronary arteries. No pericardial or pleural effusion. ABDOMEN/PELVIS No abnormal hypermetabolism in the liver, adrenal glands, spleen or pancreas. No hypermetabolic lymph nodes. Liver margin is slightly irregular. There may be sludge in the gallbladder. Adrenal glands are unremarkable. Low and high attenuation lesions in the kidneys, better characterized on 12/28/2014. Spleen, pancreas, stomach and bowel are grossly  unremarkable. Atherosclerotic calcification of the arterial vasculature without abdominal aortic aneurysm. Right common iliac artery measures 2.1 cm. No free fluid. SKELETON No abnormal osseous hypermetabolism. IMPRESSION: 1. Hypermetabolic left upper and left lower lobe nodules, most indicative of synchronous bronchogenic carcinomas. No evidence of metastatic disease. 2. Aortic atherosclerosis (ICD10-170.0). Coronary artery calcification. Right common iliac artery aneurysm. 3. Liver margin is slightly irregular, raising suspicion for cirrhosis. 4. There may be sludge in the gallbladder. Electronically Signed   By: Lorin Picket M.D.   On: 10/02/2016 14:08    Labs:  CBC:  Recent Labs  10/18/16 0946  WBC 9.9  HGB 16.8  HCT 47.3  PLT 231    COAGS: No results for input(s): INR, APTT in the last 8760 hours.  BMP: No results for input(s): NA, K, CL, CO2, GLUCOSE, BUN, CALCIUM, CREATININE, GFRNONAA, GFRAA in the last 8760 hours.  Invalid input(s): CMP  LIVER FUNCTION TESTS: No results for input(s): BILITOT, AST, ALT, ALKPHOS, PROT, ALBUMIN in the last 8760 hours.  TUMOR MARKERS: No results for input(s): AFPTM, CEA, CA199, CHROMGRNA in the last 8760 hours.  Assessment and Plan:  Left lung mass  Hx COPD; CXR revealed abnormal finding CT  and PET + Now scheduled for LUL mass biopsy Risks and Benefits discussed with the patient including, but not limited to bleeding, hemoptysis, respiratory failure requiring intubation, infection, pneumothorax requiring chest tube placement, stroke from air embolism or even death. All of the patient's questions were answered, patient is agreeable to proceed. Consent signed and in chart.   Thank you for this interesting consult.  I greatly enjoyed meeting Adam Bass and look forward to participating in their care.  A copy of this report was sent to the requesting provider on this date.  Electronically Signed: Lavonia Drafts, PA-C 10/18/2016,  10:03 AM   I spent a total of  30 Minutes   in face to face in clinical consultation, greater than 50% of which was counseling/coordinating care for left lung mass bx

## 2016-10-18 NOTE — Progress Notes (Signed)
Dr Vernard Gambles notified of cxr results and he will be in to see client

## 2016-10-18 NOTE — Progress Notes (Signed)
Adam Bass, [place] in and OK to d/c home

## 2017-01-29 ENCOUNTER — Other Ambulatory Visit (HOSPITAL_COMMUNITY): Payer: Self-pay | Admitting: Pulmonary Disease

## 2017-01-29 DIAGNOSIS — R918 Other nonspecific abnormal finding of lung field: Secondary | ICD-10-CM

## 2017-02-19 ENCOUNTER — Ambulatory Visit (HOSPITAL_COMMUNITY)
Admission: RE | Admit: 2017-02-19 | Discharge: 2017-02-19 | Disposition: A | Discharge status: Home / Self Care | Payer: BLUE CROSS/BLUE SHIELD | Source: Ambulatory Visit | Attending: Pulmonary Disease | Admitting: Pulmonary Disease

## 2017-02-19 DIAGNOSIS — I700 Atherosclerosis of aorta: Secondary | ICD-10-CM | POA: Insufficient documentation

## 2017-02-19 DIAGNOSIS — R918 Other nonspecific abnormal finding of lung field: Secondary | ICD-10-CM | POA: Insufficient documentation

## 2017-02-19 DIAGNOSIS — J439 Emphysema, unspecified: Secondary | ICD-10-CM | POA: Insufficient documentation

## 2017-02-25 ENCOUNTER — Other Ambulatory Visit (HOSPITAL_COMMUNITY): Payer: Self-pay | Admitting: Pulmonary Disease

## 2017-02-25 DIAGNOSIS — R918 Other nonspecific abnormal finding of lung field: Secondary | ICD-10-CM

## 2017-03-03 ENCOUNTER — Ambulatory Visit (HOSPITAL_COMMUNITY): Payer: BLUE CROSS/BLUE SHIELD

## 2017-03-19 ENCOUNTER — Ambulatory Visit (HOSPITAL_COMMUNITY): Payer: BLUE CROSS/BLUE SHIELD

## 2017-03-19 ENCOUNTER — Encounter (HOSPITAL_COMMUNITY): Payer: Self-pay

## 2017-04-02 ENCOUNTER — Ambulatory Visit (HOSPITAL_COMMUNITY)
Admission: RE | Admit: 2017-04-02 | Discharge: 2017-04-02 | Disposition: A | Discharge status: Home / Self Care | Payer: BLUE CROSS/BLUE SHIELD | Source: Ambulatory Visit | Attending: Pulmonary Disease | Admitting: Pulmonary Disease

## 2017-04-02 DIAGNOSIS — R918 Other nonspecific abnormal finding of lung field: Secondary | ICD-10-CM | POA: Insufficient documentation

## 2017-04-02 DIAGNOSIS — K111 Hypertrophy of salivary gland: Secondary | ICD-10-CM | POA: Diagnosis not present

## 2017-04-02 LAB — GLUCOSE, CAPILLARY: GLUCOSE-CAPILLARY: 104 mg/dL — AB (ref 65–99)

## 2017-04-02 MED ORDER — FLUDEOXYGLUCOSE F - 18 (FDG) INJECTION
7.6000 | Freq: Once | INTRAVENOUS | Status: AC | PRN
  Administered 2017-04-02: 7.6 via INTRAVENOUS

## 2017-04-03 ENCOUNTER — Other Ambulatory Visit (HOSPITAL_COMMUNITY): Payer: Self-pay | Admitting: Pulmonary Disease

## 2017-04-03 DIAGNOSIS — R918 Other nonspecific abnormal finding of lung field: Secondary | ICD-10-CM

## 2017-04-10 ENCOUNTER — Ambulatory Visit (HOSPITAL_COMMUNITY)
Admission: RE | Admit: 2017-04-10 | Discharge: 2017-04-10 | Disposition: A | Discharge status: Home / Self Care | Payer: BLUE CROSS/BLUE SHIELD | Source: Ambulatory Visit | Attending: Interventional Radiology | Admitting: Interventional Radiology

## 2017-04-10 ENCOUNTER — Encounter (HOSPITAL_COMMUNITY): Payer: Self-pay

## 2017-04-10 ENCOUNTER — Ambulatory Visit (HOSPITAL_COMMUNITY)
Admission: RE | Admit: 2017-04-10 | Discharge: 2017-04-10 | Disposition: A | Discharge status: Home / Self Care | Payer: BLUE CROSS/BLUE SHIELD | Source: Ambulatory Visit | Attending: Pulmonary Disease | Admitting: Pulmonary Disease

## 2017-04-10 DIAGNOSIS — Z803 Family history of malignant neoplasm of breast: Secondary | ICD-10-CM | POA: Diagnosis not present

## 2017-04-10 DIAGNOSIS — Z7982 Long term (current) use of aspirin: Secondary | ICD-10-CM | POA: Insufficient documentation

## 2017-04-10 DIAGNOSIS — F17290 Nicotine dependence, other tobacco product, uncomplicated: Secondary | ICD-10-CM | POA: Insufficient documentation

## 2017-04-10 DIAGNOSIS — C3432 Malignant neoplasm of lower lobe, left bronchus or lung: Secondary | ICD-10-CM | POA: Diagnosis not present

## 2017-04-10 DIAGNOSIS — Z79899 Other long term (current) drug therapy: Secondary | ICD-10-CM | POA: Insufficient documentation

## 2017-04-10 DIAGNOSIS — J449 Chronic obstructive pulmonary disease, unspecified: Secondary | ICD-10-CM | POA: Diagnosis not present

## 2017-04-10 DIAGNOSIS — R918 Other nonspecific abnormal finding of lung field: Secondary | ICD-10-CM

## 2017-04-10 DIAGNOSIS — Z91030 Bee allergy status: Secondary | ICD-10-CM | POA: Insufficient documentation

## 2017-04-10 DIAGNOSIS — R911 Solitary pulmonary nodule: Secondary | ICD-10-CM | POA: Diagnosis not present

## 2017-04-10 LAB — PROTIME-INR
INR: 1
Prothrombin Time: 13.1 seconds (ref 11.4–15.2)

## 2017-04-10 LAB — CBC
HCT: 47.1 % (ref 39.0–52.0)
HEMOGLOBIN: 17.1 g/dL — AB (ref 13.0–17.0)
MCH: 37.7 pg — AB (ref 26.0–34.0)
MCHC: 36.3 g/dL — ABNORMAL HIGH (ref 30.0–36.0)
MCV: 104 fL — AB (ref 78.0–100.0)
Platelets: 259 10*3/uL (ref 150–400)
RBC: 4.53 MIL/uL (ref 4.22–5.81)
RDW: 11.6 % (ref 11.5–15.5)
WBC: 10.6 10*3/uL — ABNORMAL HIGH (ref 4.0–10.5)

## 2017-04-10 LAB — APTT: aPTT: 31 seconds (ref 24–36)

## 2017-04-10 MED ORDER — LIDOCAINE HCL 1 % IJ SOLN
INTRAMUSCULAR | Status: AC
  Filled 2017-04-10: qty 20

## 2017-04-10 MED ORDER — FENTANYL CITRATE (PF) 100 MCG/2ML IJ SOLN
INTRAMUSCULAR | Status: AC | PRN
  Administered 2017-04-10 (×2): 25 ug via INTRAVENOUS

## 2017-04-10 MED ORDER — FENTANYL CITRATE (PF) 100 MCG/2ML IJ SOLN
INTRAMUSCULAR | Status: AC
  Filled 2017-04-10: qty 4

## 2017-04-10 MED ORDER — MIDAZOLAM HCL 2 MG/2ML IJ SOLN
INTRAMUSCULAR | Status: AC
  Filled 2017-04-10: qty 4

## 2017-04-10 MED ORDER — MIDAZOLAM HCL 2 MG/2ML IJ SOLN
INTRAMUSCULAR | Status: AC | PRN
  Administered 2017-04-10: 0.5 mg via INTRAVENOUS
  Administered 2017-04-10: 1 mg via INTRAVENOUS

## 2017-04-10 MED ORDER — SODIUM CHLORIDE 0.9 % IV SOLN
INTRAVENOUS | Status: AC | PRN
  Administered 2017-04-10: 10 mL/h via INTRAVENOUS

## 2017-04-10 NOTE — Discharge Instructions (Addendum)
Needle Biopsy of the Lung, Care After °This sheet gives you information about how to care for yourself after your procedure. Your health care provider may also give you more specific instructions. If you have problems or questions, contact your health care provider. °What can I expect after the procedure? °After the procedure, it is common to have: °· Soreness, pain, and tenderness where a tissue sample was taken (biopsy site). °· A cough. °· A sore throat. ° °Follow these instructions at home: °Biopsy site care °· Follow instructions from your health care provider about when to remove the bandage that was placed on the biopsy site. °· Keep the bandage dry until it has been removed. °· Check your biopsy site every day for signs of infection. Check for: °? More redness, swelling, or pain. °? More fluid or blood. °? Warmth to the touch. °? Pus or a bad smell. °General instructions °· Rest as directed by your health care provider. Ask your health care provider what activities are safe for you. °· Do not take baths, swim, or use a hot tub until your health care provider approves. °· Take over-the-counter and prescription medicines only as told by your health care provider. °· If you have airplane travel scheduled, talk with your health care provider about when it is safe for you to travel by airplane. °· It is up to you to get the results of your procedure. Ask your health care provider, or the department that is doing the procedure, when your results will be ready. °· Keep all follow-up visits as told by your health care provider. This is important. °Contact a health care provider if: °· You have more redness, swelling, or pain around your biopsy site. °· You have more fluid or blood coming from your biopsy site. °· Your biopsy site feels warm to the touch. °· You have pus or a bad smell coming from your biopsy site. °· You have a fever. °· You have pain that does not get better with medicine. °Get help right away  if: °· You have problems breathing. °· You have chest pain. °· You cough up blood. °· You faint. °· You have a fast heart rate. °Summary °· After a needle biopsy of the lung, it is common to have a cough, a sore throat, or soreness, pain, and tenderness where a tissue sample was taken (biopsy site). °· You should check your biopsy area every day for signs of infection, including pus or a bad smell, warmth, more fluid or blood, or more redness, swelling, or pain. °· You should not take baths, swim, or use a hot tub until your health care provider approves. °· It is up to you to get the results of your procedure. Ask your health care provider, or the department that is doing the procedure, when your results will be ready. °This information is not intended to replace advice given to you by your health care provider. Make sure you discuss any questions you have with your health care provider. °Document Released: 01/27/2007 Document Revised: 02/21/2016 Document Reviewed: 02/21/2016 °Elsevier Interactive Patient Education © 2017 [employer] ° °Moderate Conscious Sedation, Adult, Care After °These instructions provide you with information about caring for yourself after your procedure. Your health care provider may also give you more specific instructions. Your treatment has been planned according to current medical practices, but problems sometimes occur. Call your health care provider if you have any problems or questions after your procedure. °What can I expect after the   procedure? °After your procedure, it is common: °· To feel sleepy for several hours. °· To feel clumsy and have poor balance for several hours. °· To have poor judgment for several hours. °· To vomit if you eat too soon. ° °Follow these instructions at home: °For at least 24 hours after the procedure: ° °· Do not: °? Participate in activities where you could fall or become injured. °? Drive. °? Use heavy machinery. °? Drink alcohol. °? Take sleeping  pills or medicines that cause drowsiness. °? Make important decisions or sign legal documents. °? Take care of children on your own. °· Rest. °Eating and drinking °· Follow the diet recommended by your health care provider. °· If you vomit: °? Drink water, juice, or soup when you can drink without vomiting. °? Make sure you have little or no nausea before eating solid foods. °General instructions °· Have a responsible adult stay with you until you are awake and alert. °· Take over-the-counter and prescription medicines only as told by your health care provider. °· If you smoke, do not smoke without supervision. °· Keep all follow-up visits as told by your health care provider. This is important. °Contact a health care provider if: °· You keep feeling nauseous or you keep vomiting. °· You feel light-headed. °· You develop a rash. °· You have a fever. °Get help right away if: °· You have trouble breathing. °This information is not intended to replace advice given to you by your health care provider. Make sure you discuss any questions you have with your health care provider. °Document Released: 01/20/2013 Document Revised: 09/04/2015 Document Reviewed: 07/22/2015 °Elsevier Interactive Patient Education © 2018 [employer] ° °

## 2017-04-10 NOTE — Procedures (Signed)
Enlarging LLL nodule  S/p CT LLL NODULE BX  Mod LLL PULM HEMORRHAGE NO PTX HD STABLE PATH PENDING FULL REPORT IN PACS

## 2017-04-10 NOTE — H&P (Signed)
Chief Complaint: Patient was seen in consultation today for left lung nodule biopsy at the request of Hawkins,Edward  Referring Physician(s): Hawkins,Edward  Supervising Physician: Markus Daft  Patient Status: [facility] - Out-pt  History of Present Illness: Adam Bass is a 65 y.o. male   Pt was seen in July 2018 for LUL nodule biopsy Lung, needle/core biopsy(ies), LUL - LUNG WITH NECROSIS, INFLAMMATION, AND FOCAL CALCIFICATIONS - NO MALIGNANCY IDENTIFIED  Known LLL nodule Now enlarging PET 04/12/17: IMPRESSION: 1. Mild decrease in metabolic activity and similar size of LEFT upper lobe nodule. Recommend follow-up CT in 3 6 months. 2. Increase in size and metabolic activity of the LEFT lower lobe nodule is concerning for neoplasm. RECOMMEND TISSUE SAMPLING OF THE LEFT LOWER LOBE NODULE. 3. 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 Consider ENT Consultation.  Scheduled now for biopsy of this nodule per Dr Luan Pulling request Approved by Dr Laurence Ferrari   Past Medical History:  Diagnosis Date  . COPD (chronic obstructive pulmonary disease) ([place])     Past Surgical History:  Procedure Laterality Date  . COLONOSCOPY  2004   Dr. Truett Perna  . COLONOSCOPY N/A 08/27/2012   Procedure: COLONOSCOPY;  Surgeon: Rogene Houston, MD;  Location: [facility] SUITE;  Service: Endoscopy;  Laterality: N/A;  1200    Allergies: Bee venom  Medications: Prior to Admission medications   Medication Sig Start Date End Date Taking? Authorizing Provider  albuterol (PROAIR HFA) 108 (90 BASE) MCG/ACT inhaler Inhale 2 puffs into the lungs every 6 (six) hours as needed for wheezing.   Yes [provider]  albuterol (PROVENTIL) 4 MG tablet Take 1 tablet by mouth 2 (two) times daily. 10/09/16  Yes [provider]  ANORO ELLIPTA 62.5-25 MCG/INH AEPB Inhale 1 puff into the lungs at bedtime. 10/10/16  Yes  [provider]  aspirin EC 81 MG tablet Take 81 mg by mouth daily.   Yes [provider]  Multiple Vitamins-Minerals (MULTIVITAMIN WITH MINERALS) tablet Take 1 tablet by mouth daily.   Yes [provider]  pantoprazole (PROTONIX) 40 MG tablet Take 1 tablet by mouth every morning. 10/08/16  Yes [provider]  montelukast (SINGULAIR) 10 MG tablet Take 10 mg by mouth at bedtime.    [provider]     Family History  Problem Relation Age of Onset  . Breast cancer Sister   . Colon cancer Neg Hx     Social History   Socioeconomic History  . Marital status: Divorced    Spouse name: None  . Number of children: None  . Years of education: None  . Highest education level: None  Social Needs  . Financial resource strain: None  . Food insecurity - worry: None  . Food insecurity - inability: None  . Transportation needs - medical: None  . Transportation needs - non-medical: None  Occupational History  . None  Tobacco Use  . Smoking status: Current Every Day Smoker    Packs/day: 1.00    Years: 45.00    Pack years: 45.00    Types: Cigars  . Smokeless tobacco: Never Used  . Tobacco comment: 1 pack of cigars a day  Substance and Sexual Activity  . Alcohol use: Yes    Comment: 12-18 beers a week, sometimes more and sometimes less  . Drug use: No  . Sexual activity: None  Other Topics Concern  . None  Social History Narrative  . None  Review of Systems: A 12 point ROS discussed and pertinent positives are indicated in the HPI above.  All other systems are negative.  Review of Systems  Constitutional: Negative for activity change, fatigue and fever.  Respiratory: Negative for cough and shortness of breath.   Gastrointestinal: Negative for abdominal pain.  Neurological: Negative for weakness.  Psychiatric/Behavioral: Negative for behavioral problems and confusion.    Vital Signs: BP 126/67   Pulse 73   Temp 98.3 F (36.8 C)  (Oral)   Resp 16   Ht 5\' 9"  (1.753 m)   Wt 155 lb (70.3 kg)   SpO2 95%   BMI 22.89 kg/m   Physical Exam  Constitutional: He is oriented to person, place, and time.  Cardiovascular: Normal rate, regular rhythm and normal heart sounds.  Pulmonary/Chest: Effort normal and breath sounds normal. He has no wheezes.  Abdominal: Soft. Bowel sounds are normal.  Musculoskeletal: Normal range of motion.  Neurological: He is alert and oriented to person, place, and time.  Skin: Skin is warm and dry.  Psychiatric: He has a normal mood and affect. His behavior is normal. Judgment and thought content normal.  Nursing note and vitals reviewed.   Imaging: Nm Pet Image Restag (ps) Skull Base To Thigh  Result Date: 04/02/2017 CLINICAL DATA:  Subsequent treatment strategy for pulmonary nodules. EXAM: NUCLEAR MEDICINE PET SKULL BASE TO THIGH TECHNIQUE: 104 mCi F-18 FDG was injected intravenously. Full-ring PET imaging was performed from the skull base to thigh after the radiotracer. CT data was obtained and used for attenuation correction and anatomic localization. FASTING BLOOD GLUCOSE:  Value: 104 mg/dl COMPARISON:  PET-CT 10/02/2016 FINDINGS: NECK No hypermetabolic lymph nodes in the neck. Hypermetabolic nodules within LEFT (14 mm) and RIGHT (14 mm) parotid glands with SUV max equals 5.5 CHEST Hypermetabolic nodule in the LEFT upper lobe is decreased activity compared to prior with SUV max equal 3.6 compared with 4.5. CT imaging lesion appears similar measuring 22 by 14 mm (image 53, series 4) compared with 21 mm x 10 mm on prior Lesion in the LEFT lower lobe measures 13 mm in greatest dimension compared 8 mm on prior. This nodule has SUV max equal 2.9 increased from SUV max equal 1.7. No new pulmonary nodules are present. ABDOMEN/PELVIS No abnormal hypermetabolic activity within the liver, pancreas, adrenal glands, or spleen. No hypermetabolic lymph nodes in the abdomen or pelvis. SKELETON No focal  hypermetabolic activity to suggest skeletal metastasis. IMPRESSION: 1. Mild decrease in metabolic activity and similar size of LEFT upper lobe nodule. Recommend follow-up CT in 3 6 months. 2. Increase in size and metabolic activity of the LEFT lower lobe nodule is concerning for neoplasm. RECOMMEND TISSUE SAMPLING OF THE LEFT LOWER LOBE NODULE. 3. 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 Consider ENT consultation. These results will be called to the ordering clinician or representative by the Radiologist Assistant, and communication documented in the PACS or zVision Dashboard. Electronically Signed   By: Suzy Bouchard M.D.   On: 04/02/2017 16:13    Labs:  CBC: Recent Labs    10/18/16 0946 04/10/17 0610  WBC 9.9 10.6*  HGB 16.8 17.1*  HCT 47.3 47.1  PLT 231 259    COAGS: Recent Labs    10/18/16 0946  INR 0.99  APTT 30    BMP: No results for input(s): NA, K, CL, CO2, GLUCOSE, BUN, CALCIUM, CREATININE, GFRNONAA, GFRAA in the last 8760 hours.  Invalid input(s): CMP  LIVER FUNCTION TESTS: No results for input(s): BILITOT, AST, ALT, ALKPHOS, PROT, ALBUMIN in the last 8760 hours.  TUMOR MARKERS: No results for input(s): AFPTM, CEA, CA199, CHROMGRNA in the last 8760 hours.  Assessment and Plan:  Bx LUL 10/2016: no malignant cells Now enlarging LLL nodule and +PET Scheduled for LLL nodule biopsy Risks and benefits discussed with the patient including, but not limited to bleeding, hemoptysis, respiratory failure requiring intubation, infection, pneumothorax requiring chest tube placement, stroke from air embolism or even death. All of the patient's questions were answered, patient is agreeable to proceed. Consent signed and in chart.  Thank you for this interesting consult.  I greatly enjoyed meeting Adam Bass and look forward to participating in their care.  A copy of this report was sent to the  requesting provider on this date.  Electronically Signed: Lavonia Drafts, PA-C 04/10/2017, 7:10 AM   I spent a total of    25 Minutes in face to face in clinical consultation, greater than 50% of which was counseling/coordinating care for LLL nodule bx

## 2017-04-16 ENCOUNTER — Ambulatory Visit (HOSPITAL_COMMUNITY): Payer: BLUE CROSS/BLUE SHIELD

## 2017-04-17 ENCOUNTER — Encounter: Payer: Self-pay | Admitting: *Deleted

## 2017-04-17 DIAGNOSIS — K210 Gastro-esophageal reflux disease with esophagitis: Secondary | ICD-10-CM | POA: Diagnosis not present

## 2017-04-17 DIAGNOSIS — N401 Enlarged prostate with lower urinary tract symptoms: Secondary | ICD-10-CM | POA: Diagnosis not present

## 2017-04-17 DIAGNOSIS — J441 Chronic obstructive pulmonary disease with (acute) exacerbation: Secondary | ICD-10-CM | POA: Diagnosis not present

## 2017-04-17 DIAGNOSIS — C3491 Malignant neoplasm of unspecified part of right bronchus or lung: Secondary | ICD-10-CM | POA: Diagnosis not present

## 2017-04-17 NOTE — Progress Notes (Signed)
Oncology Nurse Navigator Documentation  Oncology Nurse Navigator Flowsheets 04/17/2017  Navigator Location [facility]  Navigator Encounter Type Other/I contacted Dr. Miguel Rota via EMR and updated him on cancer conference discussion on Mr. Farinas.   Interventions Other  Acuity Level 1  Time Spent with Patient 15

## 2017-04-21 ENCOUNTER — Inpatient Hospital Stay (HOSPITAL_COMMUNITY): Payer: BLUE CROSS/BLUE SHIELD

## 2017-04-21 ENCOUNTER — Other Ambulatory Visit: Payer: Self-pay

## 2017-04-21 ENCOUNTER — Encounter (HOSPITAL_COMMUNITY): Payer: Self-pay | Admitting: Hematology and Oncology

## 2017-04-21 ENCOUNTER — Inpatient Hospital Stay (HOSPITAL_COMMUNITY): Payer: BLUE CROSS/BLUE SHIELD | Attending: Hematology and Oncology | Admitting: Hematology and Oncology

## 2017-04-21 VITALS — BP 132/68 | HR 80 | Temp 97.5°F | Resp 20 | Ht 69.0 in | Wt 152.5 lb

## 2017-04-21 DIAGNOSIS — C3432 Malignant neoplasm of lower lobe, left bronchus or lung: Secondary | ICD-10-CM

## 2017-04-21 DIAGNOSIS — Z720 Tobacco use: Secondary | ICD-10-CM | POA: Insufficient documentation

## 2017-04-21 DIAGNOSIS — J449 Chronic obstructive pulmonary disease, unspecified: Secondary | ICD-10-CM | POA: Diagnosis not present

## 2017-04-21 DIAGNOSIS — Z803 Family history of malignant neoplasm of breast: Secondary | ICD-10-CM | POA: Diagnosis not present

## 2017-04-21 DIAGNOSIS — C3490 Malignant neoplasm of unspecified part of unspecified bronchus or lung: Secondary | ICD-10-CM

## 2017-04-21 DIAGNOSIS — Z7289 Other problems related to lifestyle: Secondary | ICD-10-CM | POA: Diagnosis not present

## 2017-04-21 LAB — CREATININE, SERUM: Creatinine, Ser: 0.83 mg/dL (ref 0.61–1.24)

## 2017-04-21 LAB — BUN: BUN: 19 mg/dL (ref 6–20)

## 2017-04-21 MED ORDER — NICOTINE 21 MG/24HR TD PT24: 21.0000 mg | MEDICATED_PATCH | Freq: Every day | TRANSDERMAL | 0 refills | Status: DC

## 2017-04-21 MED ORDER — LORAZEPAM 0.5 MG PO TABS: 0.5000 mg | ORAL_TABLET | Freq: Four times a day (QID) | ORAL | 0 refills | Status: DC | PRN

## 2017-04-21 MED ORDER — NICOTINE POLACRILEX 2 MG MT LOZG: 2.0000 mg | LOZENGE | OROMUCOSAL | 0 refills | Status: DC | PRN

## 2017-04-22 ENCOUNTER — Encounter: Payer: Self-pay | Admitting: Hematology and Oncology

## 2017-04-22 NOTE — Progress Notes (Signed)
Submitted auth request for Nicotine today.  Status is pending.

## 2017-04-23 NOTE — Progress Notes (Addendum)
Thoracic Location of Tumor / Histology:  Left Lower Lobe- non-small cell carcinoma.   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 1 pack daily of small cigars for 45 years. He is trying to quit, he currently is wearing a nicotine patch, but admits to an occasional "puff". He drinks 24 cans of beer weekly.   Past/Anticipated interventions by cardiothoracic surgery, if any: Referral placed. He has not been scheduled for appointment.   Past/Anticipated interventions by medical oncology, if any:  Dr. Lebron Conners 04/21/17 (note not in Epic) I see referrals placed for a cardiothoracic surgeon and MRI of brain Will return to see him in about 3 weeks.   Signs/Symptoms  Weight changes, if any: He has gained about 5 lbs recently. He attributes this to attempting to quit smoking.   Respiratory complaints, if any: He reports shortness of breath with activity. He also has a productive cough of white sputum.   Hemoptysis, if any: He did for one week after his biopsy, but none since that time. He denies having hemoptysis before his biopsy.   Pain issues, if any:  He denies   SAFETY ISSUES:  Prior radiation? No  Pacemaker/ICD? No  Possible current pregnancy? N/A  Is the patient on methotrexate? No  Current Complaints / other details:   MRI brain 04/30/17  CT chest.  IMPRESSION: 1. Mild decrease in metabolic activity and similar size of LEFT upper lobe nodule. Recommend follow-up CT in 3 6 months. 2. Increase in size and metabolic activity of the LEFT lower lobe nodule is concerning for neoplasm. RECOMMEND TISSUE SAMPLING OF THE LEFT LOWER LOBE NODULE. 3. 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 Consider  ENT Consultation.  BP (!) 141/57   Pulse 76   Temp 98.8 F (37.1 C)   Ht 5\' 9"  (1.753 m)   Wt 156 lb 12.8 oz (71.1 kg)   SpO2 95% Comment: room air  BMI 23.16 kg/m    Wt Readings from Last 3 Encounters:  04/30/17 156 lb 12.8 oz (71.1 kg)  04/21/17 152 lb 8 oz (69.2 kg)  04/10/17 155 lb (70.3 kg)

## 2017-04-24 ENCOUNTER — Encounter: Payer: Self-pay | Admitting: Hematology and Oncology

## 2017-04-24 NOTE — Progress Notes (Signed)
Pt's  Nicotine was denied.  Gave denial to the nurse.

## 2017-04-29 ENCOUNTER — Ambulatory Visit: Payer: BLUE CROSS/BLUE SHIELD | Admitting: Radiation Oncology

## 2017-04-29 ENCOUNTER — Ambulatory Visit: Payer: BLUE CROSS/BLUE SHIELD

## 2017-04-30 ENCOUNTER — Ambulatory Visit (HOSPITAL_COMMUNITY)
Admission: RE | Admit: 2017-04-30 | Discharge: 2017-04-30 | Disposition: A | Discharge status: Home / Self Care | Payer: BLUE CROSS/BLUE SHIELD | Source: Ambulatory Visit | Attending: Hematology and Oncology | Admitting: Hematology and Oncology

## 2017-04-30 ENCOUNTER — Ambulatory Visit
Admission: RE | Admit: 2017-04-30 | Discharge: 2017-04-30 | Disposition: A | Discharge status: Home / Self Care | Payer: BLUE CROSS/BLUE SHIELD | Source: Ambulatory Visit | Attending: Radiation Oncology | Admitting: Radiation Oncology

## 2017-04-30 ENCOUNTER — Ambulatory Visit (HOSPITAL_COMMUNITY): Admission: RE | Admit: 2017-04-30 | Payer: BLUE CROSS/BLUE SHIELD | Source: Ambulatory Visit

## 2017-04-30 ENCOUNTER — Encounter: Payer: Self-pay | Admitting: Radiation Oncology

## 2017-04-30 ENCOUNTER — Encounter: Payer: Self-pay | Admitting: *Deleted

## 2017-04-30 DIAGNOSIS — Z85828 Personal history of other malignant neoplasm of skin: Secondary | ICD-10-CM | POA: Diagnosis not present

## 2017-04-30 DIAGNOSIS — F17290 Nicotine dependence, other tobacco product, uncomplicated: Secondary | ICD-10-CM | POA: Diagnosis not present

## 2017-04-30 DIAGNOSIS — C3432 Malignant neoplasm of lower lobe, left bronchus or lung: Secondary | ICD-10-CM

## 2017-04-30 DIAGNOSIS — Z7982 Long term (current) use of aspirin: Secondary | ICD-10-CM | POA: Diagnosis not present

## 2017-04-30 DIAGNOSIS — Z79899 Other long term (current) drug therapy: Secondary | ICD-10-CM | POA: Insufficient documentation

## 2017-04-30 DIAGNOSIS — I739 Peripheral vascular disease, unspecified: Secondary | ICD-10-CM | POA: Diagnosis not present

## 2017-04-30 DIAGNOSIS — Z720 Tobacco use: Secondary | ICD-10-CM | POA: Diagnosis not present

## 2017-04-30 DIAGNOSIS — C3490 Malignant neoplasm of unspecified part of unspecified bronchus or lung: Secondary | ICD-10-CM

## 2017-04-30 DIAGNOSIS — Z8709 Personal history of other diseases of the respiratory system: Secondary | ICD-10-CM | POA: Diagnosis not present

## 2017-04-30 DIAGNOSIS — I6789 Other cerebrovascular disease: Secondary | ICD-10-CM | POA: Insufficient documentation

## 2017-04-30 DIAGNOSIS — J449 Chronic obstructive pulmonary disease, unspecified: Secondary | ICD-10-CM | POA: Insufficient documentation

## 2017-04-30 DIAGNOSIS — Z716 Tobacco abuse counseling: Secondary | ICD-10-CM | POA: Diagnosis not present

## 2017-04-30 DIAGNOSIS — D490 Neoplasm of unspecified behavior of digestive system: Secondary | ICD-10-CM | POA: Diagnosis not present

## 2017-04-30 MED ORDER — GADOBENATE DIMEGLUMINE 529 MG/ML IV SOLN
15.0000 mL | Freq: Once | INTRAVENOUS | Status: AC | PRN
  Administered 2017-04-30: 15 mL via INTRAVENOUS

## 2017-04-30 NOTE — Progress Notes (Signed)
Radiation Oncology         [phone] ________________________________  Initial Outpatient Consultation  Name: Adam Bass MRN: [id number]  Date: 04/30/2017  DOB: [birth year]  SW:NIOEVOJ, Percell Miller, MD  Sinda Du, MD   REFERRING PHYSICIAN: Sinda Du, MD  DIAGNOSIS:    ICD-10-CM   1. 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 male with a history of COPD who initially presented in May 2018 with cough and weakness for several weeks. An abnormal screening chest x-ray prompted CT imaging of the chest which showed a somewhat spiculated nodular area in the left upper lobe. PET scan in June 2018 showed hypermetabolic left upper and left lower lobe nodules but no evidence of metastatic disease. Biopsy of the left upper lobe lesion on 10/18/2016 was negative for carcinoma. The patient then returned for follow-up CT of the chest in November 2018 which showed an interval enlargement of the left lower lobe nodule, now measuring 10 mm, with the left upper lobe nodule remaining unchanged. PET scan did show a mild decrease in metabolic activity of the left upper lobe nodule, while there was an increase in size and metabolic activity of the left lower lobe nodule. 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  The patient subsequently saw Dr. Lebron Conners on 04/21/2017 who ordered staging scans including an MRI of the brain that was performed earlier today. This was negative for metastatic disease or acute intracranial abnormality. A referral  has been placed to cardiothoracic surgery, but the patient has not been scheduled for an appointment yet. He has PFT's scheduled for tomorrow. He has an appointment at [facility] on 05/14/2017.   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    The patient has kindly been referred today for discussion of potential radiation treatment options. On review of systems, he reports he has gained about 5 pounds recently which he attributes to eating more since attempting to quit smoking. He reports shortness of breath with activity and a productive cough with white sputum. He reports having hemoptysis for one week following his biopsy but has not had any since. He denies having hemoptysis before his biopsy. He reports occasional urinary frequency. He reports indigestion.  The patient has a significant smoking history. He is a current smoker and smokes 1 pack of small cigars daily for the past 40-45 years. He reports he is trying to quit and started a  nicotine patch within the past week, prescribed by Dr. Lebron Conners. He mentions that he still currently smokes 4-5 cigars per day. He also drinks 24 cans of beer weekly.  PREVIOUS RADIATION THERAPY: No  PAST MEDICAL HISTORY:  has a past medical history of COPD (chronic obstructive pulmonary disease) ([place]), Lung cancer ([place]), and Skin cancer of face (07/2015).    PAST SURGICAL HISTORY: Past Surgical History:  Procedure Laterality Date  . COLONOSCOPY  2004   Dr. Truett Perna  . COLONOSCOPY N/A 08/27/2012  Procedure: COLONOSCOPY;  Surgeon: Rogene Houston, MD;  Location: [facility];  Service: Endoscopy;  Laterality: N/A;  1200    FAMILY HISTORY: family history includes Breast cancer in his sister.  SOCIAL HISTORY:  reports that he has been smoking cigars.  He has a 45.00 pack-year smoking history. he has never used smokeless tobacco.  He reports that he drinks about 14.4 oz of alcohol per week. He reports that he does not use drugs.  ALLERGIES: Bee venom  MEDICATIONS:  Current Outpatient Medications  Medication Sig Dispense Refill  . albuterol (PROAIR HFA) 108 (90 BASE) MCG/ACT inhaler Inhale 2 puffs into the lungs every 6 (six) hours as needed for wheezing.    Marland Kitchen albuterol (PROVENTIL) 4 MG tablet Take 1 tablet by mouth 2 (two) times daily.  5  . ANORO ELLIPTA 62.5-25 MCG/INH AEPB Inhale 1 puff into the lungs at bedtime.  12  . aspirin EC 81 MG tablet Take 81 mg by mouth daily.    Marland Kitchen HYDROMET 5-1.5 MG/5ML syrup TK 5 MLS PO QID  0  . montelukast (SINGULAIR) 10 MG tablet Take 10 mg by mouth at bedtime.    . Multiple Vitamins-Minerals (MULTIVITAMIN WITH MINERALS) tablet Take 1 tablet by mouth daily.    . nicotine (NICODERM CQ - DOSED IN MG/24 HOURS) 21 mg/24hr patch Place 1 patch (21 mg total) onto the skin daily. 28 patch 0  . nicotine polacrilex ([employer] NICOTINE) 2 MG lozenge Take 1 lozenge (2 mg total) by mouth as needed for smoking cessation. 100 tablet 0  . pantoprazole (PROTONIX) 40 MG tablet Take 1 tablet by mouth every morning.  12  . LORazepam (ATIVAN) 0.5 MG tablet Take 1 tablet (0.5 mg total) by mouth every 6 (six) hours as needed for anxiety. (Patient not taking: Reported on 04/30/2017) 25 tablet 0   No current facility-administered medications for this encounter.     REVIEW OF SYSTEMS:  A 10+ POINT REVIEW OF SYSTEMS WAS OBTAINED including neurology, dermatology, psychiatry, cardiac, respiratory, lymph, extremities, GI, GU, Musculoskeletal, constitutional,  HEENT.  All pertinent positives are noted in the HPI.  All others are negative.   PHYSICAL EXAM:  height is 5\' 9"  (1.753 m) and weight is 156 lb 12.8 oz (71.1 kg). His temperature is 98.8 F (37.1 C). His blood pressure is 141/57 (abnormal) and his pulse is 76. His oxygen saturation is 95%.   General: Alert and oriented, in no acute distress. HEENT: Head is  normocephalic. Extraocular movements are intact. Dentures and partials removed for oral exam. He still has some remaining teeth in the mandibular region.  Oropharynx is clear. Neck: He has a little mobile mass behind the angle of the right mandible which is about 1.5 cm in greatest dimension and is superficial. Heart: Regular in rate and rhythm with no murmurs, rubs, or gallops. Chest: Clear to auscultation bilaterally, with no rhonchi, wheezes, or rales. Abdomen: Soft, nontender, nondistended, with no rigidity or guarding. Extremities: No cyanosis or edema in upper extremities. Lymphatics: see Neck Exam Skin: No concerning lesions. Musculoskeletal: Symmetric strength and muscle tone throughout. Neurologic: Cranial nerves II through XII are grossly intact. No obvious focalities. Speech is fluent. Coordination is intact. Psychiatric: Judgment and insight are intact. Affect is appropriate.   ECOG = 1  0 - Asymptomatic (Fully active, able to carry on all predisease activities without restriction)  1 - Symptomatic but completely ambulatory (Restricted in physically strenuous activity but ambulatory and able to carry out work of a  light or sedentary nature. For example, light housework, office work)  2 - Symptomatic, <50% in bed during the day (Ambulatory and capable of all self care but unable to carry out any work activities. Up and about more than 50% of waking hours)  3 - Symptomatic, >50% in bed, but not bedbound (Capable of only limited self-care, confined to bed or chair 50% or more of waking hours)  4 - Bedbound (Completely disabled. Cannot carry on any self-care. Totally confined to bed or chair)  5 - Death   Eustace Pen MM, Creech RH, Tormey DC, et al. [phone]). "Toxicity and response criteria of the [facility]". [place] Oncol. 5 (6): 649-55   LABORATORY DATA:  Lab Results  Component Value Date   WBC 10.6 (H) 04/10/2017   HGB 17.1 (H) 04/10/2017   HCT 47.1  04/10/2017   MCV 104.0 (H) 04/10/2017   PLT 259 04/10/2017   CMP     Component Value Date/Time   BUN 19 04/21/2017 1035   CREATININE 0.83 04/21/2017 1035   GFRNONAA >60 04/21/2017 1035   GFRAA >60 04/21/2017 1035         RADIOGRAPHY review by me: Mr Jeri Cos Wo Contrast  Result Date: 04/30/2017 CLINICAL DATA:  67 year old male with new diagnosis of non-small cell lung cancer. Staging. EXAM: MRI HEAD WITHOUT AND WITH CONTRAST TECHNIQUE: Multiplanar, multiecho pulse sequences of the brain and surrounding structures were obtained without and with intravenous contrast. CONTRAST:  15 milliliters MultiHance COMPARISON:  Head CT without contrast 11/10/2013. FINDINGS: Brain: No abnormal enhancement identified. No midline shift, mass effect, or evidence of intracranial mass lesion. No dural thickening. Cerebral volume is within normal limits for age. No restricted diffusion to suggest acute infarction. No ventriculomegaly, extra-axial collection or acute intracranial hemorrhage. Cervicomedullary junction and pituitary are within normal limits. Patchy mostly periventricular bilateral cerebral white matter T2 and FLAIR hyperintensity. No cortical encephalomalacia. No definite chronic cerebral blood products (artifact suspected on series 10, image 10 T2* imaging). Deep gray matter nuclei appear normal for age. Mild patchy T2 hyperintensity in the pons. Cerebellum appears normal. Vascular: Major intracranial vascular flow voids are preserved. Skull and upper cervical spine: Visualized bone marrow signal is within normal limits. Negative visualized cervical spine and spinal cord. Sinuses/Orbits: Normal orbits soft tissues. Paranasal sinuses are clear. Other: Visible internal auditory structures appear normal. Mastoid air cells are clear. Scalp and face soft tissues appear negative. IMPRESSION: 1.  No metastatic disease or acute intracranial abnormality. 2. Mild to moderate for age signal changes in the cerebral  white matter and pons, nonspecific but most commonly due to chronic small vessel disease. Electronically Signed   By: Genevie Ann M.D.   On: 04/30/2017 10:05   Nm Pet Image Restag (ps) Skull Base To Thigh  Result Date: 04/02/2017 CLINICAL DATA:  Subsequent treatment strategy for pulmonary nodules. EXAM: NUCLEAR MEDICINE PET SKULL BASE TO THIGH TECHNIQUE: 104 mCi F-18 FDG was injected intravenously. Full-ring PET imaging was performed from the skull base to thigh after the radiotracer. CT data was obtained and used for attenuation correction and anatomic localization. FASTING BLOOD GLUCOSE:  Value: 104 mg/dl COMPARISON:  PET-CT 10/02/2016 FINDINGS: NECK No hypermetabolic lymph nodes in the neck. Hypermetabolic nodules within LEFT (14 mm) and RIGHT (14 mm) parotid glands with SUV max equals 5.5 CHEST Hypermetabolic nodule in the LEFT upper lobe is decreased activity compared to prior with SUV max equal 3.6 compared with 4.5. CT imaging lesion appears similar measuring  22 by 14 mm (image 53, series 4) compared with 21 mm x 10 mm on prior Lesion in the LEFT lower lobe measures 13 mm in greatest dimension compared 8 mm on prior. This nodule has SUV max equal 2.9 increased from SUV max equal 1.7. No new pulmonary nodules are present. ABDOMEN/PELVIS No abnormal hypermetabolic activity within the liver, pancreas, adrenal glands, or spleen. No hypermetabolic lymph nodes in the abdomen or pelvis. SKELETON No focal hypermetabolic activity to suggest skeletal metastasis. IMPRESSION: 1. Mild decrease in metabolic activity and similar size of LEFT upper lobe nodule. Recommend follow-up CT in 3 6 months. 2. Increase in size and metabolic activity of the LEFT lower lobe nodule is concerning for neoplasm. RECOMMEND TISSUE SAMPLING OF THE LEFT LOWER LOBE NODULE. 3. 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 Consider ENT consultation. These results will be  called to the ordering clinician or representative by the Radiologist Assistant, and communication documented in the PACS or zVision Dashboard. Electronically Signed   By: Suzy Bouchard M.D.   On: 04/02/2017 16:13   Ct Biopsy  Result Date: 04/10/2017 INDICATION: Enlarging PET positive left lower lobe pulmonary nodule EXAM: CT-GUIDED BIOPSY LEFT LOWER LOBE PULMONARY NODULE MEDICATIONS: 1% lidocaine local ANESTHESIA/SEDATION: 1.5 mg IV Versed; 50 mcg IV Fentanyl Moderate Sedation Time:  15 minutes The patient was continuously monitored during the procedure by the interventional radiology nurse under my direct supervision. PROCEDURE: The procedure, risks, benefits, and alternatives were explained to the patient. Questions regarding the procedure were encouraged and answered. The patient understands and consents to the procedure. Previous imaging reviewed. Patient positioned supine with the left arm up. Noncontrast localization CT performed. The left lower lobe 13 mm nodule was localized. Lateral approach was marked. Under sterile conditions and local anesthesia, a 17 gauge 6.8 cm access needle was advanced from a lateral intercostal approach to the lesion. Needle position confirmed with CT. 2 18 gauge core biopsies attempted. One small core was obtained. Samples placed in formalin. Postprocedure imaging demonstrates a moderate left lower lobe surrounding pulmonary hemorrhage. No pneumothorax or effusion. Needle removed. Patient tolerated the procedure well. Vital sign monitoring by nursing staff during the procedure will continue as patient is in the special procedures unit for post procedure observation. FINDINGS: The images document guide needle placement within the left lower lobe pulmonary nodule. Post biopsy images demonstrate moderate left lower lobe pulmonary hemorrhage. COMPLICATIONS: SIR Level A - No therapy, no consequence. IMPRESSION: Successful CT-guided left lower lobe nodule 18 gauge core biopsy  Electronically Signed   By: Jerilynn Mages.  Shick M.D.   On: 04/10/2017 10:00   Dg Chest Port 1 View  Result Date: 04/10/2017 CLINICAL DATA:  Status post left lower lobe nodule biopsy EXAM: PORTABLE CHEST 1 VIEW COMPARISON:  04/10/2017 FINDINGS: Left mid lung opacity correlates with the left lower lobe pulmonary hemorrhage following biopsy. This obscures the left lower lobe nodule by chest x-ray. No significant effusion or pneumothorax. Left upper lobe spiculated nodule also noted. Right lung remains clear. Normal heart size and vascularity. Trachea is midline. Atherosclerosis noted of the aorta. IMPRESSION: Negative for pneumothorax or pleural effusion following biopsy. Left lower lobe airspace opacity correlates with post biopsy pulmonary hemorrhage. Electronically Signed   By: Jerilynn Mages.  Shick M.D.   On: 04/10/2017 10:54      IMPRESSION/PLAN: NSCLC of the Left Lower Lobe It was a pleasure meeting the patient today. The patient appears to be a good candidate for stereotactic  body radiation treatment for the patient's diagnosis of lung cancer. We discussed the risks, benefits, and side effects of radiotherapy. No guarantees of treatment were given. A consent form was signed and placed in the patient's medical record. The patient is enthusiastic about proceeding with treatment. I would anticipate 3 fractions of stereotactic body radiation treatment to the left lower lung. I look forward to participating in the patient's care, tentatively. I contacted Loel Ro, lung navigator, asking if she can facilitate a surgical opinion for clearance in the near future ( I anticipate he is not an ideal candidate for surgery), either via tumor board or via a consult... I will tentatively schedule him for CT simulation on Fri January 25th; this can be cancelled if needed, though patient would like to move forward soon, and we are trying to work around his job's schedule.  PFTs pending for tomorrow.  I asked the patient today about  tobacco use. The patient uses tobacco.  I advised the patient to quit. Services were offered by me today including outpatient counseling and pharmacotherapy. I assessed for the willingness to attempt to quit and provided encouragement and demonstrated willingness to make referrals and/or prescriptions to help the patient attempt to quit. The patient has follow-up with the oncologic team to touch base on their tobacco use and /or cessation efforts.  Over 5 minutes were spent on this issue. I advised the patient to continue using the nicotine patch: apply 21 mg patch daily x6wk, then 14 mg patch daily x2wk, then 7 mg patch daily x2wk. He is not willing to completely quit at this time but will continue cutting down and would like to eventually quit completely. He set a goal quit-date of February 1st 2019.  Regarding the parotid lesions seen on recent PET, we will present his case at our next ENT conference to discuss his imaging and determine whether biopsy of the lesions would be feasible. If ENT feels biopsy is necessary, we will schedule him for one. Favor benign Warthins tumors.   __________________________________________   Eppie Gibson, MD  This document serves as a record of services personally performed by Eppie Gibson, MD. It was created on her behalf by Rae Lips, a trained medical scribe. The creation of this record is based on the scribe's personal observations and the provider's statements to them. This document has been checked and approved by the attending provider.

## 2017-05-01 ENCOUNTER — Encounter: Payer: Self-pay | Admitting: *Deleted

## 2017-05-01 ENCOUNTER — Encounter (HOSPITAL_COMMUNITY): Payer: Self-pay | Admitting: Hematology and Oncology

## 2017-05-01 ENCOUNTER — Encounter (HOSPITAL_COMMUNITY): Payer: BLUE CROSS/BLUE SHIELD

## 2017-05-01 NOTE — Progress Notes (Signed)
[facility] Cancer New Visit:  Assessment: Primary cancer of left lower lobe of lung (HCC) 67 y.o. 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  No radiographic evidence of lymph node involvement noted.  Additional staging is necessary to confirm limited stage of the disease.  Assessment by mouth a specialty team is definitely in patient's best interest.  Systemic therapies are not likely to be indicated for limited stage disease, but consideration for surgical or radiotherapy approaches should be made by respective specialists.  Plan: -- MRI brain --Pulmonary function test if not done recently --Consult pulmonology for bronchoscopy and EBUS for mediastinal lymph node assessment -- Consult cardiothoracic surgery and radiation oncology to assess for possible treatment options. -- Patient instructed to abandon smoking and stop alcohol consumption at this time to facilitate undergoing therapy for his malignancy.  He is in agreement to proceed with cessation for both.   --Start nicotine 21 mg transdermal patch as well as lozenges for nicotine withdrawal symptoms.   --Lorazepam 0.5 mg 3 times a day as needed for possible alcohol withdrawal. --Return to clinic with our service in 3 weeks for continued clinical monitoring and care coordination.  Voice recognition software was used and creation of this note. Despite my best effort at editing the text, some misspelling/errors may have occurred.  Orders Placed This Encounter  Procedures  . MR Brain W Wo Contrast    Standing Status:   Future    Number of Occurrences:   1    Standing Expiration Date:   04/21/2018    Order Specific Question:   If indicated for the ordered procedure, I authorize the administration of contrast media per Radiology protocol    Answer:   Yes    Order Specific Question:   What is the patient's sedation requirement?    Answer:   No Sedation    Order  Specific Question:   Does the patient have a pacemaker or implanted devices?    Answer:   No    Order Specific Question:   Radiology Contrast Protocol - do NOT remove file path    Answer:   [email or web address]    Order Specific Question:   Reason for Exam additional comments    Answer:   NSCLC staging, please eval for metastatic disease evidence    Order Specific Question:   Preferred imaging location?    Answer:   [facility] (table limit-350lbs)  . BUN & Creatinine ([facility])    Standing Status:   Future    Number of Occurrences:   1    Standing Expiration Date:   04/21/2018  . Ambulatory referral to Cardiothoracic Surgery    Referral Priority:   Routine    Referral Type:   Surgical    Referral Reason:   Specialty Services Required    Requested Specialty:   Cardiothoracic Surgery    Number of Visits Requested:   1  . Ambulatory referral to Radiation Oncology    Referral Priority:   Routine    Referral Type:   Consultation    Referral Reason:   Specialty Services Required    Requested Specialty:   Radiation Oncology    Number of Visits Requested:   1  . Ambulatory referral to Pulmonology    Referral Priority:   Routine    Referral Type:   Consultation    Referral Reason:   Specialty Services Required    Requested   Specialty:   Pulmonary Disease    Number of Visits Requested:   1  . Pulmonary Function Test    Standing Status:   Future    Standing Expiration Date:   04/21/2018    Order Specific Question:   Where should this test be performed?    Answer:   [facility]    Order Specific Question:   Full PFT: includes the following: basic spirometry, spirometry pre & post bronchodilator, diffusion capacity (DLCO), lung volumes    Answer:   Full PFT    Order Specific Question:   ABG    Answer:   Yes    Order Specific Question:   Diffusion capacity (DLCO)    Answer:   Yes    Order Specific Question:   Lung volumes    Answer:   Yes    All questions  were answered.  . The patient knows to call the clinic with any problems, questions or concerns.  This note was electronically signed.    History of Presenting Illness CLAUDE WALDMAN 67 y.o. presenting to the [facility] for new diagnosis of squamous cell carcinoma morphology of non-small cell lung carcinoma.  Patient was referred to [place] by Dr Sinda Du.  Past medical history is significant for long-term tobacco abuse, COPD, chronic alcohol use.  Patient has history of lung nodules.  Patient had a biopsy of the left upper lobe nodule in July 2019 returning nonmalignant findings.  Subsequently he was monitored.  Imaging in November/December 2018 demonstrated progression of the left lower lobe nodule and repeat biopsy was obtained demonstrating presence of malignancy.  Please see oncological history below for details of evaluation.  At this time, patient denies any fevers, chills, night sweats.  Denies unexpected weight loss, change in appetite, or activity tolerance.  Denies active chest pain, shortness of breath, or cough.  No nausea, vomiting, abdominal pain, diarrhea, constipation.  No urinary or neurological symptoms.  Oncological/hematological History:   Primary cancer of left lower lobe of lung ([place])   09/05/2016 Imaging    CT Chest: Mild apical scarring noted in the right lung.  Diffuse emphysematous changes bilaterally.  Irregular spiculated nodule in the left upper lobe measuring 2.1 cm.      10/02/2016 PET scan    No hypermetabolic lymphadenopathy in the neck.  No hypermetabolic mediastinal, hilar, or axillary lymph nodes.  Spiculated nodule in the left upper lobe measuring 1.9 cm, SUV max 4.5.  0.8 cm nodule in the left lower lobe with SUV max of 1.7.   no evidence of hypermetabolic lesions in the liver, adrenal glands, spleen, or pancreas.  No abdominal or pelvic lymphadenopathy.  Liver margin appears somewhat irregular with possible cirrhosis.      10/22/2016 Pathology Results     LtUL Nodule Bx: Lung with necrosis, inflammation, and focal calcifications.  No evidence of malignancy.      04/02/2017 PET scan    Left lower lobe lesion measuring 1.3 cm up from 0.8 cm previously, SUV max of 2.9 up from 1.7 in the past.  Left upper lobe nodule SUV max of 3.6 down from 4.5 previously, measuring 2.2 cm, 2.1 cm previously.  Bilateral hypermetabolic carotid lesions noted.      04/10/2017 Initial Diagnosis    Primary cancer of left lower lobe of lung ([place])      04/10/2017 Pathology Results    LtLL Lung Bx: Positive for presence of malignant cells, consistent with squamous cell carcinoma. IHC -- positive for CK5/6,  p63 & negative for TTF-1 & Napsin-A        Medical History: Past Medical History:  Diagnosis Date  . COPD (chronic obstructive pulmonary disease) (HCC)    for 10 years or more   . Lung cancer (HCC)   . Skin cancer of face 07/2015   skin cancer removed from face by Dr. Beavers in [place]    Surgical History: Past Surgical History:  Procedure Laterality Date  . COLONOSCOPY  2004   Dr. Jenkins-[facility]  . COLONOSCOPY N/A 08/27/2012   Procedure: COLONOSCOPY;  Surgeon: Najeeb U Rehman, MD;  Location: [facility];  Service: Endoscopy;  Laterality: N/A;  1200    Family History: Family History  Problem Relation Age of Onset  . Breast cancer Sister   . Colon cancer Neg Hx     Social History: Social History   Socioeconomic History  . Marital status: Divorced    Spouse name: Not on file  . Number of children: Not on file  . Years of education: Not on file  . Highest education level: Not on file  Social Needs  . Financial resource strain: Not on file  . Food insecurity - worry: Not on file  . Food insecurity - inability: Not on file  . Transportation needs - medical: Not on file  . Transportation needs - non-medical: Not on file  Occupational History  . Not on file  Tobacco Use  . Smoking status: Current Every Day Smoker    Packs/day: 1.00     Years: 45.00    Pack years: 45.00    Types: Cigars  . Smokeless tobacco: Never Used  . Tobacco comment: 1 pack of cigars a day, he is trying to quit, he is wearing a patch today.   Substance and Sexual Activity  . Alcohol use: Yes    Alcohol/week: 14.4 oz    Types: 24 Cans of beer per week  . Drug use: No  . Sexual activity: Not on file  Other Topics Concern  . Not on file  Social History Narrative  . Not on file    Allergies: Allergies  Allergen Reactions  . Bee Venom     Medications:  Current Outpatient Medications  Medication Sig Dispense Refill  . albuterol (PROAIR HFA) 108 (90 BASE) MCG/ACT inhaler Inhale 2 puffs into the lungs every 6 (six) hours as needed for wheezing.    . albuterol (PROVENTIL) 4 MG tablet Take 1 tablet by mouth 2 (two) times daily.  5  . ANORO ELLIPTA 62.5-25 MCG/INH AEPB Inhale 1 puff into the lungs at bedtime.  12  . aspirin EC 81 MG tablet Take 81 mg by mouth daily.    . HYDROMET 5-1.5 MG/5ML syrup TK 5 MLS PO QID  0  . montelukast (SINGULAIR) 10 MG tablet Take 10 mg by mouth at bedtime.    . Multiple Vitamins-Minerals (MULTIVITAMIN WITH MINERALS) tablet Take 1 tablet by mouth daily.    . pantoprazole (PROTONIX) 40 MG tablet Take 1 tablet by mouth every morning.  12  . LORazepam (ATIVAN) 0.5 MG tablet Take 1 tablet (0.5 mg total) by mouth every 6 (six) hours as needed for anxiety. (Patient not taking: Reported on 04/30/2017) 25 tablet 0  . nicotine (NICODERM CQ - DOSED IN MG/24 HOURS) 21 mg/24hr patch Place 1 patch (21 mg total) onto the skin daily. 28 patch 0  . nicotine polacrilex ([employer] NICOTINE) 2 MG lozenge Take 1 lozenge (2 mg total) by mouth as needed for   smoking cessation. 100 tablet 0   No current facility-administered medications for this visit.     Review of Systems: Review of Systems  Hematological: Bruises/bleeds easily.  All other systems reviewed and are negative.    PHYSICAL EXAMINATION Blood pressure 132/68, pulse 80,  temperature (!) 97.5 F (36.4 C), temperature source Oral, resp. rate 20, height 5' 9" (1.753 m), weight 152 lb 8 oz (69.2 kg), SpO2 95 %.  ECOG PERFORMANCE STATUS: 1 - Symptomatic but completely ambulatory  Physical Exam  Constitutional: He is oriented to person, place, and time and well-developed, well-nourished, and in no distress. No distress.  HENT:  Head: Normocephalic and atraumatic.  Mouth/Throat: Oropharynx is clear and moist. No oropharyngeal exudate.  Eyes: Conjunctivae and EOM are normal. Pupils are equal, round, and reactive to light. No scleral icterus.  Neck: No thyromegaly present.  Cardiovascular: Normal rate, regular rhythm and normal heart sounds.  No murmur heard. Pulmonary/Chest: Effort normal and breath sounds normal. No respiratory distress. He has no wheezes. He has no rales.  Abdominal: Soft. Bowel sounds are normal. He exhibits no distension. There is no tenderness. There is no rebound.  Musculoskeletal: He exhibits no edema.  Lymphadenopathy:    He has no cervical adenopathy.  Neurological: He is alert and oriented to person, place, and time. He has normal reflexes. No cranial nerve deficit.  Skin: Skin is warm and dry. No rash noted. He is not diaphoretic. No erythema.     LABORATORY DATA: I have personally reviewed the data as listed: Lab on 04/21/2017  Component Date Value Ref Range Status  . BUN 04/21/2017 19  6 - 20 mg/dL Final  . Creatinine, Ser 04/21/2017 0.83  0.61 - 1.24 mg/dL Final  . GFR calc non Af Amer 04/21/2017 >60  >60 mL/min Final  . GFR calc Af Amer 04/21/2017 >60  >60 mL/min Final   Comment: (NOTE) The eGFR has been calculated using the CKD EPI equation. This calculation has not been validated in all clinical situations. eGFR's persistently <60 mL/min signify possible Chronic Kidney Disease.          Mikhail G Perlov, MD   

## 2017-05-01 NOTE — Progress Notes (Signed)
Oncology Nurse Navigator Documentation  Met with patient during initial consult with Dr. Isidore Moos.  He was unaccompanied.    1. Introduced myself as Investment banker, operational, explained my role as a member of the [employer].  He voiced understanding his navigation may transition to Thoracic Navigator pending further evaluation of bilateral parotids detected in recent PET. 2. Provided New Patient Information packet, discussed contents:  Contact information for physician(s), myself, other members of the Care Team.  Advance Directive information ([place] blue pamphlet with LCSW contact info)  Fall Prevention Patient Safety Plan  Appointment [place] sheet  [place] campus map with highlight of [place] 3. Provided introductory explanation of radiation treatment including SIM planning.  4.   Provided information/discussed opportunities for smoking cessation support:    Child psychotherapist class/individual counseling at [facility] information 5.   I encouraged him to contact me with questions/concerns as treatments/procedures begin.  He verbalized understanding of information provided.    Gayleen Orem, RN, BSN Head & Neck Oncology Nurse [facility] [phone]

## 2017-05-01 NOTE — Assessment & Plan Note (Signed)
67 y.o. 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  No radiographic evidence of lymph node involvement noted.  Additional staging is necessary to confirm limited stage of the disease.  Assessment by mouth a specialty team is definitely in patient's best interest.  Systemic therapies are not likely to be indicated for limited stage disease, but consideration for surgical or radiotherapy approaches should be made by respective specialists.  Plan: -- MRI brain --Pulmonary function test if not done recently --Consult pulmonology for bronchoscopy and EBUS for mediastinal lymph node assessment -- Consult cardiothoracic surgery and radiation oncology to assess for possible treatment options. -- Patient instructed to abandon smoking and stop alcohol consumption at this time to facilitate undergoing therapy for his malignancy.  He is in agreement to proceed with cessation for both.   --Start nicotine 21 mg transdermal patch as well as lozenges for nicotine withdrawal symptoms.   --Lorazepam 0.5 mg 3 times a day as needed for possible alcohol withdrawal. --Return to clinic with our service in 3 weeks for continued clinical monitoring and care coordination.

## 2017-05-01 NOTE — Progress Notes (Addendum)
Documentation incorrect date.  Deleted/reentered.

## 2017-05-06 ENCOUNTER — Ambulatory Visit (HOSPITAL_COMMUNITY)
Admission: RE | Admit: 2017-05-06 | Discharge: 2017-05-06 | Disposition: A | Discharge status: Home / Self Care | Payer: BLUE CROSS/BLUE SHIELD | Source: Ambulatory Visit | Attending: Hematology and Oncology | Admitting: Hematology and Oncology

## 2017-05-06 DIAGNOSIS — R942 Abnormal results of pulmonary function studies: Secondary | ICD-10-CM | POA: Diagnosis not present

## 2017-05-06 DIAGNOSIS — C3432 Malignant neoplasm of lower lobe, left bronchus or lung: Secondary | ICD-10-CM | POA: Diagnosis not present

## 2017-05-06 DIAGNOSIS — J984 Other disorders of lung: Secondary | ICD-10-CM | POA: Insufficient documentation

## 2017-05-06 LAB — PULMONARY FUNCTION TEST
DL/VA % pred: 38 %
DL/VA: 1.75 ml/min/mmHg/L
DLCO COR: 11.31 ml/min/mmHg
DLCO UNC % PRED: 37 %
DLCO UNC: 11.73 ml/min/mmHg
DLCO cor % pred: 36 %
FEF 25-75 PRE: 0.32 L/s
FEF 25-75 Post: 0.44 L/sec
FEF2575-%Change-Post: 39 %
FEF2575-%PRED-PRE: 12 %
FEF2575-%Pred-Post: 17 %
FEV1-%Change-Post: 17 %
FEV1-%Pred-Post: 47 %
FEV1-%Pred-Pre: 40 %
FEV1-POST: 1.52 L
FEV1-Pre: 1.3 L
FEV1FVC-%Change-Post: 15 %
FEV1FVC-%Pred-Pre: 43 %
FEV6-%CHANGE-POST: 12 %
FEV6-%PRED-PRE: 69 %
FEV6-%Pred-Post: 78 %
FEV6-POST: 3.23 L
FEV6-Pre: 2.88 L
FEV6FVC-%Change-Post: 10 %
FEV6FVC-%PRED-POST: 83 %
FEV6FVC-%Pred-Pre: 75 %
FVC-%Change-Post: 1 %
FVC-%Pred-Post: 94 %
FVC-%Pred-Pre: 93 %
FVC-Post: 4.14 L
FVC-Pre: 4.07 L
POST FEV6/FVC RATIO: 78 %
Post FEV1/FVC ratio: 37 %
Pre FEV1/FVC ratio: 32 %
Pre FEV6/FVC Ratio: 71 %
RV % pred: 234 %
RV: 5.49 L
TLC % PRED: 139 %
TLC: 9.49 L

## 2017-05-06 LAB — BLOOD GAS, ARTERIAL
Acid-Base Excess: 2.5 mmol/L — ABNORMAL HIGH (ref 0.0–2.0)
BICARBONATE: 26.7 mmol/L (ref 20.0–28.0)
Drawn by: 21179
FIO2: 21
O2 Saturation: 95.9 %
PATIENT TEMPERATURE: 37
PO2 ART: 75.4 mmHg — AB (ref 83.0–108.0)
pCO2 arterial: 38.9 mmHg (ref 32.0–48.0)
pH, Arterial: 7.444 (ref 7.350–7.450)

## 2017-05-06 MED ORDER — ALBUTEROL SULFATE (2.5 MG/3ML) 0.083% IN NEBU
2.5000 mg | INHALATION_SOLUTION | Freq: Once | RESPIRATORY_TRACT | Status: AC
  Administered 2017-05-06: 2.5 mg via RESPIRATORY_TRACT

## 2017-05-07 ENCOUNTER — Telehealth: Payer: Self-pay

## 2017-05-07 NOTE — Telephone Encounter (Signed)
Several messages have been left for the patient to call the office in reference to his insurance denial for a Nicotine Transdermal System. Per letter [address] White-patient financial advocate received from [employer], patient needs to contact member services for further assistance. Several attempts made to contact patient since 04/24/17 without success.

## 2017-05-09 ENCOUNTER — Ambulatory Visit: Payer: BLUE CROSS/BLUE SHIELD | Admitting: Radiation Oncology

## 2017-05-09 ENCOUNTER — Telehealth: Payer: Self-pay

## 2017-05-09 ENCOUNTER — Encounter: Payer: Self-pay | Admitting: *Deleted

## 2017-05-09 DIAGNOSIS — C3432 Malignant neoplasm of lower lobe, left bronchus or lung: Secondary | ICD-10-CM

## 2017-05-09 NOTE — Progress Notes (Signed)
Oncology Nurse Navigator Documentation  Oncology Nurse Navigator Flowsheets 05/09/2017  Navigator Location [facility]  Navigator Encounter Type Other/per Dr. Isidore Moos referral to T surgery completed. I will update their office on urgent referral.  Treatment Phase Pre-Tx/Tx Discussion  Barriers/Navigation Needs Coordination of Care  Interventions Coordination of Care  Coordination of Care Other  Acuity Level 1  Time Spent with Patient 15

## 2017-05-09 NOTE — Telephone Encounter (Signed)
I called Mr. Radermacher and left a message regarding his radiation treatment. He will need to have a Museum/gallery curator surgical consult for his insurance to approve radiation treatments. He should hear from someone soon about the timing of that appointment. I left my number to call if he has further questions or concerns.

## 2017-05-13 NOTE — Progress Notes (Signed)
[address]        [place] [address]             [id number]                    Werner A Stiehl [place] Medical Record #[id number] Date of Birth: [birth year]  Referring: Eppie Gibson, MD Primary Care: Sinda Du, MD Primary Cardiologist: No primary care provider on file.  Chief Complaint:    Chief Complaint  Patient presents with  . Lung Cancer    Surgical eval, PFT's 05/06/17, MR Brain 04/30/17, CT BX 04/10/17, PET Scan 04/02/17, Chest CT 02/19/17     History of Present Illness:    Adam Bass 67 y.o. 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  The patient is a long-term smoker with known significant COPD in the spring 2018 he had a screening CT scan done that suggested a left upper lobe suspicious lesion.  In July needle biopsy of this area performed, inflammatory and necrotic tissue but without malignancy was noted on the path.  The patient was continued to be followed with CT scan and in November repeat scan showed no change in the left upper lobe lesion but a new lesion in the left lower lobe was noted, both lesions are hypermetabolic on PET scan.  CT directed needle biopsy 04/10/2017 of the left lower lobe lesion confirmed non-small cell lung cancer. 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  He is somewhat limited by shortness of breath.  He notes shortness of breath walking across the parking lot to go to work.  He can for 45 years he smoked up to 2 packs/day recently he has cut back to half a pack a day.      Diagnosis  BX [id number] Lung, needle/core biopsy(ies), Left Lower Lobe - NON-SMALL CELL CARCINOMA - SEE COMMENT Microscopic Comment The biopsy material has an invasive carcinoma consistent with non-small cell carcinoma; immunohistochemistry is pending. Dr. Neita Garnet reviewed the case and agrees with the above diagnosis. Dr. Luan Pulling was notified of these results on  April 11, 2017. Thressa Sheller MD Pathologist, Electronic Signature (Case signed 04/11/2017)  The malignant cells are positive for cytokeratin 5/6 and p63. They are negative for TTF-1 and Napsin-A. 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 Additional studies can be performed upon clinician request. (JBK:kh 04/14/17) Enid Cutter MD Current Activity/ Functional Status:  Patient is independent with mobility/ambulation, transfers, ADL's, IADL's.   Zubrod Score: At the time of surgery this patient's most appropriate activity status/level should be described as: []     0    Normal activity, no symptoms [x]     1    Restricted in physical strenuous activity but ambulatory, able to do out light work []     2    Ambulatory and capable of self care, unable to do work activities, up and about               >50 % of waking hours                              []     3    Only limited self care, in bed greater than 50% of waking hours []     4    Completely disabled, no self care, confined to bed or chair []   5    Moribund   Past Medical History:  Diagnosis Date  . COPD (chronic obstructive pulmonary disease) ([place])    for 10 years or more   . Lung cancer ([place])   . Skin cancer of face 07/2015   skin cancer removed from face by Dr. Tarri Glenn in [place]    Past Surgical History:  Procedure Laterality Date  . COLONOSCOPY  2004   Dr. Truett Perna  . COLONOSCOPY N/A 08/27/2012   Procedure: COLONOSCOPY;  Surgeon: Rogene Houston, MD;  Location: [facility];  Service: Endoscopy;  Laterality: N/A;  1200    Family History  Problem Relation Age of Onset  . Breast cancer Sister   . Colon cancer Neg Hx     Social History   Socioeconomic History  . Marital status: Divorced    Spouse name: Not on file  . Number of children: Not on file  . Years of education: Not on file  . Highest education level: Not on file  Social Needs  . Financial resource strain: Not on file  . Food  insecurity - worry: Not on file  . Food insecurity - inability: Not on file  . Transportation needs - medical: Not on file  . Transportation needs - non-medical: Not on file  Occupational History  . Not on file  Tobacco Use  . Smoking status: Current Every Day Smoker    Packs/day: 1.00    Years: 50.00    Pack years: 50.00    Types: Cigars  . Smokeless tobacco: Never Used  . Tobacco comment: 1 pack of cigars a day, he is trying to quit, he is wearing a patch today.   Substance and Sexual Activity  . Alcohol use: Yes    Alcohol/week: 14.4 oz    Types: 24 Cans of beer per week  . Drug use: No  . Sexual activity: Not on file  Other Topics Concern  . Not on file  Social History Narrative  . Not on file    Social History   Tobacco Use  Smoking Status Current Every Day Smoker  . Packs/day: 1.00  . Years: 50.00  . Pack years: 50.00  . Types: Cigars  Smokeless Tobacco Never Used  Tobacco Comment   1 pack of cigars a day, he is trying to quit, he is wearing a patch today.     Social History   Substance and Sexual Activity  Alcohol Use Yes  . Alcohol/week: 14.4 oz  . Types: 24 Cans of beer per week     Allergies  Allergen Reactions  . Bee Venom     Current Outpatient Medications  Medication Sig Dispense Refill  . albuterol (PROAIR HFA) 108 (90 BASE) MCG/ACT inhaler Inhale 2 puffs into the lungs every 6 (six) hours as needed for wheezing.    Marland Kitchen albuterol (PROVENTIL) 4 MG tablet Take 1 tablet by mouth 2 (two) times daily.  5  . ANORO ELLIPTA 62.5-25 MCG/INH AEPB Inhale 1 puff into the lungs at bedtime.  12  . aspirin EC 81 MG tablet Take 81 mg by mouth daily.    Marland Kitchen HYDROMET 5-1.5 MG/5ML syrup TK 5 MLS PO QID  0  . LORazepam (ATIVAN) 0.5 MG tablet Take 1 tablet (0.5 mg total) by mouth every 6 (six) hours as needed for anxiety. 25 tablet 0  . montelukast (SINGULAIR) 10 MG tablet Take 10 mg by mouth at bedtime.    . Multiple Vitamins-Minerals (MULTIVITAMIN WITH MINERALS)  tablet Take 1  tablet by mouth daily.    . nicotine (NICODERM CQ - DOSED IN MG/24 HOURS) 21 mg/24hr patch Place 1 patch (21 mg total) onto the skin daily. 28 patch 0  . nicotine polacrilex ([employer] NICOTINE) 2 MG lozenge Take 1 lozenge (2 mg total) by mouth as needed for smoking cessation. 100 tablet 0  . pantoprazole (PROTONIX) 40 MG tablet Take 1 tablet by mouth every morning.  12   No current facility-administered medications for this visit.     Pertinent items are noted in HPI.   Review of Systems:  Review of Systems  Constitutional: Negative for chills, diaphoresis, fever, malaise/fatigue and weight loss.  HENT: Negative.   Eyes: Negative for blurred vision, double vision, photophobia, pain, discharge and redness.  Respiratory: Positive for cough, sputum production, shortness of breath and wheezing. Negative for hemoptysis.   Cardiovascular: Negative for chest pain, palpitations, orthopnea, claudication, leg swelling and PND.  Gastrointestinal: Negative for abdominal pain, blood in stool, constipation, diarrhea, heartburn, melena, nausea and vomiting.  Genitourinary: Negative.   Musculoskeletal: Negative.   Skin: Negative.   Neurological: Negative.  Negative for weakness.  Endo/Heme/Allergies: Negative.   Psychiatric/Behavioral: Negative.    Immunizations: Flu up to date [ y ]; Pneumococcal up to date [  y];   Physical Exam: BP 135/80   Pulse 85   Resp 20   Ht 5\' 9"  (1.753 m)   Wt 158 lb (71.7 kg)   BMI 23.33 kg/m   PHYSICAL EXAMINATION: General appearance: alert, cooperative, appears older than stated age and no distress Head: Normocephalic, without obvious abnormality, atraumatic Neck: no adenopathy, no carotid bruit, no JVD, supple, symmetrical, trachea midline and thyroid not enlarged, symmetric, no tenderness/mass/nodules Lymph nodes: Cervical, supraclavicular, and axillary nodes normal. Resp: diminished breath sounds bibasilar Back: symmetric, no curvature. ROM  normal. No CVA tenderness. Cardio: regular rate and rhythm, S1, S2 normal, no murmur, click, rub or gallop GI: soft, non-tender; bowel sounds normal; no masses,  no organomegaly Extremities: extremities normal, atraumatic, no cyanosis or edema and Homans sign is negative, no sign of DVT Neurologic: Grossly normal  Diagnostic Studies & Laboratory data:     Recent Radiology Findings:  CLINICAL DATA:  Follow-up lung nodules. COPD. Biopsy of left upper lobe nodule on 10/18/2016 without malignancy identified.  EXAM: CT CHEST WITHOUT CONTRAST  TECHNIQUE: Multidetector CT imaging of the chest was performed following the standard protocol without IV contrast.  COMPARISON:  PET-CT 10/02/2016 and chest CT 09/05/2016  FINDINGS: Cardiovascular: Mild thoracic aortic atherosclerosis without aneurysm. Prominent three-vessel coronary artery atherosclerosis. Normal heart size. No pericardial effusion.  Mediastinum/Nodes: No enlarged axillary, mediastinal, or hilar lymph nodes. Unremarkable esophagus and thyroid.  Lungs/Pleura: No pleural effusion or pneumothorax. Moderate centrilobular emphysema and mild bronchial wall thickening with scarring in the lung apices. Spiculated left upper lobe nodule is unchanged, measuring 2.3 x 1.2 x 1.3 cm. An 11 x 9 mm (mean 10 mm) left lower lobe nodule has enlarged (series 4, image 104, 7 x 6 mm on the prior chest CT). No new nodules are identified.  Upper Abdomen: Partially visualized approximately 2 cm cyst in the upper pole of the left kidney. Unchanged subcentimeter low-density lesion in the upper pole of the right kidney.  Musculoskeletal: No suspicious lytic or blastic osseous lesion. Left greater than right facet arthrosis at C7-T1 with grade 1 anterolisthesis, unchanged.  IMPRESSION: 1. Interval enlargement of a 10 mm left lower lobe nodule highly suggestive of bronchogenic carcinoma. 2. Unchanged spiculated left upper lobe  nodule. 3. Aortic Atherosclerosis (ICD10-I70.0) and Emphysema (ICD10-J43.9).   Electronically Signed   By: Logan Bores M.D.   On: 02/20/2017 08:18    Mr Jeri Cos YB Contrast  Result Date: 04/30/2017 CLINICAL DATA:  67 year old male with new diagnosis of non-small cell lung cancer. Staging. EXAM: MRI HEAD WITHOUT AND WITH CONTRAST TECHNIQUE: Multiplanar, multiecho pulse sequences of the brain and surrounding structures were obtained without and with intravenous contrast. CONTRAST:  15 milliliters MultiHance COMPARISON:  Head CT without contrast 11/10/2013. FINDINGS: Brain: No abnormal enhancement identified. No midline shift, mass effect, or evidence of intracranial mass lesion. No dural thickening. Cerebral volume is within normal limits for age. No restricted diffusion to suggest acute infarction. No ventriculomegaly, extra-axial collection or acute intracranial hemorrhage. Cervicomedullary junction and pituitary are within normal limits. Patchy mostly periventricular bilateral cerebral white matter T2 and FLAIR hyperintensity. No cortical encephalomalacia. No definite chronic cerebral blood products (artifact suspected on series 10, image 10 T2* imaging). Deep gray matter nuclei appear normal for age. Mild patchy T2 hyperintensity in the pons. Cerebellum appears normal. Vascular: Major intracranial vascular flow voids are preserved. Skull and upper cervical spine: Visualized bone marrow signal is within normal limits. Negative visualized cervical spine and spinal cord. Sinuses/Orbits: Normal orbits soft tissues. Paranasal sinuses are clear. Other: Visible internal auditory structures appear normal. Mastoid air cells are clear. Scalp and face soft tissues appear negative. IMPRESSION: 1.  No metastatic disease or acute intracranial abnormality. 2. Mild to moderate for age signal changes in the cerebral white matter and pons, nonspecific but most commonly due to chronic small vessel disease.  Electronically Signed   By: Genevie Ann M.D.   On: 04/30/2017 10:05     I have independently reviewed the above radiology studies  and reviewed the findings with the patient.   Recent Lab Findings: Lab Results  Component Value Date   WBC 10.6 (H) 04/10/2017   HGB 17.1 (H) 04/10/2017   HCT 47.1 04/10/2017   PLT 259 04/10/2017   CREATININE 0.83 04/21/2017   BUN 19 04/21/2017   INR 1.00 04/10/2017   PFT's:04/2017 Interpretation: 1. spirometry shows severe ventilatory defect with airflow obstruction and good response to bronchodilator 2.lung volumes show air trapping. 3.airway resistance is high 4.DLCO severely reduced.  FEV1 1.3 40% 1.52 with bronch dilator 47% DLCO 11.73 37%  Assessment / Plan:   Clinical stage I carcinoma of the lung left lower lobe, suspicious but benign lesion on biopsy in the left upper lobe in a patient with significantly limited pulmonary reserve 40% predicted FEV1 and 37% predicted diffusion capacity.  The treatment of lung cancer with surgical resection was discussed with the patient in detail including the risks and options.  He obviously would be at increased risk for surgical resection with his limited pulmonary reserve.  In discussing this with him he wishes to proceed with radiation therapy rather than consider surgical resection.  With stereotactic radiotherapy to the left lower lobe lesion he will need to be closely followed with serial CT scans to evaluate both the left lower lobe lesion and also the left upper lobe lesion.     I  spent 40 minutes counseling the patient face to face.   Grace Isaac MD      [address] [place] [address] Office [phone]   Beeper [phone]  05/14/2017 5:06 PM

## 2017-05-14 ENCOUNTER — Ambulatory Visit (HOSPITAL_COMMUNITY): Payer: BLUE CROSS/BLUE SHIELD | Admitting: Internal Medicine

## 2017-05-14 ENCOUNTER — Institutional Professional Consult (permissible substitution) (INDEPENDENT_AMBULATORY_CARE_PROVIDER_SITE_OTHER): Payer: BLUE CROSS/BLUE SHIELD | Admitting: Cardiothoracic Surgery

## 2017-05-14 VITALS — BP 135/80 | HR 85 | Resp 20 | Ht 69.0 in | Wt 158.0 lb

## 2017-05-14 DIAGNOSIS — C3492 Malignant neoplasm of unspecified part of left bronchus or lung: Secondary | ICD-10-CM

## 2017-05-20 ENCOUNTER — Ambulatory Visit
Admission: RE | Admit: 2017-05-20 | Discharge: 2017-05-20 | Disposition: A | Discharge status: Home / Self Care | Payer: BLUE CROSS/BLUE SHIELD | Source: Ambulatory Visit | Attending: Radiation Oncology | Admitting: Radiation Oncology

## 2017-05-20 DIAGNOSIS — C3432 Malignant neoplasm of lower lobe, left bronchus or lung: Secondary | ICD-10-CM | POA: Diagnosis not present

## 2017-05-20 DIAGNOSIS — Z510 Encounter for antineoplastic radiation therapy: Secondary | ICD-10-CM | POA: Diagnosis not present

## 2017-05-20 NOTE — Progress Notes (Signed)
  Radiation Oncology         [phone] ________________________________  Name: Adam Bass MRN: [id number]  Date: 05/20/2017  DOB: 26-Apr-1950  4DCT COMPLEX SIMULATION / TREATMENT PLANNING NOTE / SPECIAL TREATMENT PROCEDURE  Outpatient    ICD-10-CM   1. Primary cancer of left lower lobe of lung (HCC) C34.32     The patient was positioned on the CT simulator in a complex treatment device custom fitted to their body: A body fix blue bag. The patient's head was in an Accuform.  The patient's arms were over their head. An abdominal compression device was snugly fitted to decrease the patient's intrathoracic movements.   RESPIRATORY MOTION MANAGEMENT SIMULATION  NARRATIVE:  In order to account for effect of respiratory motion on target structures and other organs in the planning and delivery of radiotherapy, this patient underwent respiratory motion management simulation.  To accomplish this, when the patient was brought to the CT simulation planning suite, 4D respiratory motion management CT images were obtained.  The CT images were loaded into the planning software.  Then, using a variety of tools including Cine, MIP, and standard views, the target volume and planning target volumes (PTV) were delineated.  Avoidance structures were contoured.  Treatment planning then occurred.  Dose volume histograms will be generated and reviewed for each of the requested structure.  I contoured the patient's ITV and increased ITV with tight margins for the PTV. I will prescribe 54 Gy in 3 fractions of 18 Gy per fraction every other day to the left lower lung tumor. I requested a DVH of the patient's lungs, target volumes, esophagus, heart, spinal cord and airways for 3D conformal planning.  Cone beam CT scans will be performed prior to each fraction to allow close PTV margins and sparing of normal tissues from high doses.  SPECIAL TREATMENT PROCEDURE NOTE:   This constitutes a special treatment  procedure due to the ablative dose delivered and the technical nature of treatment.  This highly technical modality of treatment ensures that the ablative dose is centered on the patient's tumor while sparing normal tissues from excessive dose and risk of detrimental effects. -----------------------------------  Eppie Gibson, MD

## 2017-05-23 ENCOUNTER — Inpatient Hospital Stay (HOSPITAL_COMMUNITY): Payer: BLUE CROSS/BLUE SHIELD | Attending: Hematology and Oncology | Admitting: Internal Medicine

## 2017-05-23 ENCOUNTER — Other Ambulatory Visit: Payer: Self-pay

## 2017-05-23 ENCOUNTER — Encounter (HOSPITAL_COMMUNITY): Payer: Self-pay | Admitting: Internal Medicine

## 2017-05-23 ENCOUNTER — Telehealth: Payer: Self-pay | Admitting: *Deleted

## 2017-05-23 VITALS — BP 144/66 | HR 81 | Temp 98.5°F | Resp 22 | Ht 69.0 in | Wt 159.5 lb

## 2017-05-23 DIAGNOSIS — K118 Other diseases of salivary glands: Secondary | ICD-10-CM | POA: Diagnosis not present

## 2017-05-23 DIAGNOSIS — R911 Solitary pulmonary nodule: Secondary | ICD-10-CM | POA: Insufficient documentation

## 2017-05-23 DIAGNOSIS — J449 Chronic obstructive pulmonary disease, unspecified: Secondary | ICD-10-CM | POA: Diagnosis not present

## 2017-05-23 DIAGNOSIS — C3432 Malignant neoplasm of lower lobe, left bronchus or lung: Secondary | ICD-10-CM | POA: Insufficient documentation

## 2017-05-23 DIAGNOSIS — D119 Benign neoplasm of major salivary gland, unspecified: Secondary | ICD-10-CM

## 2017-05-23 NOTE — Progress Notes (Signed)
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 Lebron Conners dates 04/21/2017 for details pertaining to his diagnosis.  Also referred to CT surgery Dr. Marilynne Drivers note dated 05/14/2017 for recommendation. Patient was also discussed at the head and neck tumor conference for the incidentally noted Warthin's tumor of the parotid on the PET scan  Per Dr.Gerhadt's note: AUGUST LONGEST 67 y.o. 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  The patient is a long-term smoker with known significant COPD in the spring 2018 he had a screening CT scan done that suggested a left upper lobe suspicious lesion.  In July needle biopsy of this area performed, inflammatory and necrotic tissue but without malignancy was noted on the path.  The patient was continued to be followed with CT scan and in November repeat scan showed no change in the left upper lobe lesion but a new lesion in the left lower lobe was noted, both lesions are hypermetabolic on PET scan.  CT directed needle biopsy 04/10/2017 of the left lower lobe lesion confirmed non-small cell lung cancer.  He is somewhat limited by shortness of breath.  He notes shortness of breath walking across the parking lot to go to work.  He can for 45 years he smoked up to 2 packs/day recently he has cut back to half a pack a day.   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  EXAM: Blood pressure (!) 144/66, pulse 81, temperature 98.5 F (36.9 C), temperature source  Oral, resp. rate (!) 22, height 5\' 9"  (1.753 m), weight 159 lb 8 oz (72.3 kg), SpO2 95 %. -No acute distress. 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 Indeterminate left upper lobe lesion-needing continued surveillance  Per CT surgery, patient has poor pulmonary reserve with predicted FEV1 at 40% and predicted diffusion capacity at 37% as such surgical resection was not an option.  Stereotactic radiation to the left lower lobe lesion was recommended. Patient is being seen by radiation oncology and is set up to start stereotactic radiation to the lung on February 19.    He is not a candidate for systemic chemotherapy. He will return to medical oncology to discuss surveillance plan in approximately 6 weeks.  He will require continued surveillance of the left upper lobe lesion as well.  Patient understands the plan. MRI brain was reviewed negative.   Dr. Everrett Coombe note has been reviewed.   Warthins tumor of the parotid:PET scan on 04/02/2017 also showed bilateral hypermetabolic parotid lesions, most consistent with primary parotid neoplasms (Warthin's tumors favored).     Per H&N oncology conference, he was recommended to see the head and neck surgeon- Dr. Erik Obey, in approximately 2 months to discuss treatment plan for the incidentally noted Warthin's tumor involving the parotids noted on the PET scan 04/02/2017.   Creola Corn, MD

## 2017-05-23 NOTE — Telephone Encounter (Signed)
Oncology Nurse Navigator Documentation  Spoke with pt, informed him recommendation at this week's H&N Conference for him to see ENT in 1-2 months for further monitoring of parotid Warthin's tumors identified in 04/02/2017 PET.  He voiced understanding he will receive call from Dr. Noreene Filbert office to arrange appt.  Gayleen Orem, RN, BSN Head & Neck Oncology Nurse [place] at [place] [phone]

## 2017-05-27 DIAGNOSIS — Z510 Encounter for antineoplastic radiation therapy: Secondary | ICD-10-CM | POA: Diagnosis not present

## 2017-05-27 DIAGNOSIS — C3432 Malignant neoplasm of lower lobe, left bronchus or lung: Secondary | ICD-10-CM | POA: Diagnosis not present

## 2017-05-30 ENCOUNTER — Telehealth: Payer: Self-pay | Admitting: *Deleted

## 2017-05-30 NOTE — Telephone Encounter (Signed)
Oncology Nurse Navigator Documentation  In follow-up to my conversation with Freda Munro 2/13 [facility] ENT requesting pt appt with Dr. Erik Obey in 1-2 months per 2/6 H&N Conference discussion, faxed appt request and supporting documentation.  Notification of successful fax transmission received.  Gayleen Orem, RN, BSN Head & Neck Oncology Nurse [place] at [place] [phone]

## 2017-06-03 ENCOUNTER — Ambulatory Visit
Admission: RE | Admit: 2017-06-03 | Discharge: 2017-06-03 | Disposition: A | Discharge status: Home / Self Care | Payer: BLUE CROSS/BLUE SHIELD | Source: Ambulatory Visit | Attending: Radiation Oncology | Admitting: Radiation Oncology

## 2017-06-03 DIAGNOSIS — Z510 Encounter for antineoplastic radiation therapy: Secondary | ICD-10-CM | POA: Diagnosis not present

## 2017-06-03 DIAGNOSIS — C3432 Malignant neoplasm of lower lobe, left bronchus or lung: Secondary | ICD-10-CM | POA: Diagnosis not present

## 2017-06-04 ENCOUNTER — Encounter: Payer: Self-pay | Admitting: Radiation Oncology

## 2017-06-05 ENCOUNTER — Ambulatory Visit
Admission: RE | Admit: 2017-06-05 | Discharge: 2017-06-05 | Disposition: A | Discharge status: Home / Self Care | Payer: BLUE CROSS/BLUE SHIELD | Source: Ambulatory Visit | Attending: Radiation Oncology | Admitting: Radiation Oncology

## 2017-06-05 DIAGNOSIS — Z510 Encounter for antineoplastic radiation therapy: Secondary | ICD-10-CM | POA: Diagnosis not present

## 2017-06-05 DIAGNOSIS — C3432 Malignant neoplasm of lower lobe, left bronchus or lung: Secondary | ICD-10-CM | POA: Diagnosis not present

## 2017-06-06 ENCOUNTER — Ambulatory Visit: Payer: BLUE CROSS/BLUE SHIELD | Admitting: Radiation Oncology

## 2017-06-06 DIAGNOSIS — D37030 Neoplasm of uncertain behavior of the parotid salivary glands: Secondary | ICD-10-CM | POA: Diagnosis not present

## 2017-06-09 ENCOUNTER — Ambulatory Visit
Admission: RE | Admit: 2017-06-09 | Discharge: 2017-06-09 | Disposition: A | Discharge status: Home / Self Care | Payer: BLUE CROSS/BLUE SHIELD | Source: Ambulatory Visit | Attending: Radiation Oncology | Admitting: Radiation Oncology

## 2017-06-09 DIAGNOSIS — C3432 Malignant neoplasm of lower lobe, left bronchus or lung: Secondary | ICD-10-CM | POA: Diagnosis not present

## 2017-06-09 DIAGNOSIS — Z510 Encounter for antineoplastic radiation therapy: Secondary | ICD-10-CM | POA: Diagnosis not present

## 2017-06-10 ENCOUNTER — Encounter: Payer: Self-pay | Admitting: Radiation Oncology

## 2017-06-10 NOTE — Progress Notes (Signed)
  Radiation Oncology         [phone]) [phone] ________________________________  Name: Adam Bass MRN: [id number]  Date: 06/10/2017  DOB: 07/31/1950  End of Treatment Note  Diagnosis:    Primary cancer of left lower lobe of lung ([place]) Staging form: Lung, AJCC 8th Edition - Clinical stage from 04/30/2017: Stage IA2 (cT1b, cN0, cM0) - Signed by Eppie Gibson, MD on 04/30/2017    Indication for treatment:  Curative       Radiation treatment dates:   06/03/17 - 06/09/17  Site/dose:   Left lung treated to 54 Gy with 3 fx of 18 Gy every other day  Beams//energy:   SBRT/SRT-3D // 6X - FFF  Narrative: The patient tolerated radiation treatment relatively well.   The patient denied pain but did endorse some fatigue throughout treatment.  Plan: The patient has completed radiation treatment. The patient will return to radiation oncology clinic for routine followup in one month. I advised them to call or return sooner if they have any questions or concerns related to their recovery or treatment.  -----------------------------------  Eppie Gibson, MD  This document serves as a record of services personally performed by Eppie Gibson, MD. It was created on his behalf by Linward Natal, a trained medical scribe. The creation of this record is based on the scribe's personal observations and the provider's statements to them. This document has been checked and approved by the attending provider.

## 2017-06-11 ENCOUNTER — Ambulatory Visit
Admission: RE | Admit: 2017-06-11 | Payer: BLUE CROSS/BLUE SHIELD | Source: Ambulatory Visit | Admitting: Radiation Oncology

## 2017-06-13 ENCOUNTER — Encounter: Payer: Self-pay | Admitting: Radiation Oncology

## 2017-06-20 DIAGNOSIS — C3491 Malignant neoplasm of unspecified part of right bronchus or lung: Secondary | ICD-10-CM | POA: Diagnosis not present

## 2017-06-20 DIAGNOSIS — N410 Acute prostatitis: Secondary | ICD-10-CM | POA: Diagnosis not present

## 2017-06-20 DIAGNOSIS — J441 Chronic obstructive pulmonary disease with (acute) exacerbation: Secondary | ICD-10-CM | POA: Diagnosis not present

## 2017-06-20 DIAGNOSIS — K210 Gastro-esophageal reflux disease with esophagitis: Secondary | ICD-10-CM | POA: Diagnosis not present

## 2017-07-03 ENCOUNTER — Encounter: Payer: Self-pay | Admitting: Radiation Oncology

## 2017-07-04 ENCOUNTER — Other Ambulatory Visit: Payer: Self-pay

## 2017-07-04 ENCOUNTER — Ambulatory Visit
Admission: RE | Admit: 2017-07-04 | Discharge: 2017-07-04 | Disposition: A | Discharge status: Home / Self Care | Payer: BLUE CROSS/BLUE SHIELD | Source: Ambulatory Visit | Attending: Radiation Oncology | Admitting: Radiation Oncology

## 2017-07-04 ENCOUNTER — Ambulatory Visit (HOSPITAL_COMMUNITY): Payer: BLUE CROSS/BLUE SHIELD | Admitting: Hematology

## 2017-07-04 ENCOUNTER — Ambulatory Visit (HOSPITAL_COMMUNITY): Payer: BLUE CROSS/BLUE SHIELD

## 2017-07-04 ENCOUNTER — Encounter: Payer: Self-pay | Admitting: Radiation Oncology

## 2017-07-04 VITALS — BP 132/56 | HR 78 | Temp 98.2°F | Ht 69.0 in | Wt 153.4 lb

## 2017-07-04 DIAGNOSIS — Z7982 Long term (current) use of aspirin: Secondary | ICD-10-CM | POA: Diagnosis not present

## 2017-07-04 DIAGNOSIS — Z923 Personal history of irradiation: Secondary | ICD-10-CM | POA: Insufficient documentation

## 2017-07-04 DIAGNOSIS — R0602 Shortness of breath: Secondary | ICD-10-CM | POA: Diagnosis not present

## 2017-07-04 DIAGNOSIS — Z79899 Other long term (current) drug therapy: Secondary | ICD-10-CM | POA: Insufficient documentation

## 2017-07-04 DIAGNOSIS — Z716 Tobacco abuse counseling: Secondary | ICD-10-CM | POA: Diagnosis not present

## 2017-07-04 DIAGNOSIS — C3432 Malignant neoplasm of lower lobe, left bronchus or lung: Secondary | ICD-10-CM | POA: Diagnosis not present

## 2017-07-04 DIAGNOSIS — F17210 Nicotine dependence, cigarettes, uncomplicated: Secondary | ICD-10-CM | POA: Insufficient documentation

## 2017-07-04 HISTORY — DX: Personal history of irradiation: Z92.3

## 2017-07-04 NOTE — Progress Notes (Signed)
Adam Bass presents for follow up of radiation completed 06/09/17 to his Left Lung. He denies pain. He does report feeling weak right after completing radiation but now reports increased energy since his original diagnosis. He has had a cold since completing therapy and did take antibiotics and prednisone but has completed therapy. He is still smoking about a pack daily. He tried nicotine patches but had a skin reaction to the patch which included itching and redness. He also reports shortness of breath with activity such as walking a long distance. He reports that he is eating well at this time, but does tell me that immediately after treatment completed he was not eating "all that much".  BP (!) 132/56 (BP Location: Right Arm)   Pulse 78   Temp 98.2 F (36.8 C)   Ht 5\' 9"  (1.753 m)   Wt 153 lb 6.4 oz (69.6 kg)   BMI 22.65 kg/m    Wt Readings from Last 3 Encounters:  07/04/17 153 lb 6.4 oz (69.6 kg)  05/23/17 159 lb 8 oz (72.3 kg)  05/14/17 158 lb (71.7 kg)

## 2017-07-04 NOTE — Progress Notes (Signed)
Radiation Oncology         [phone] ________________________________  Name: Adam Bass MRN: [id number]  Date: 07/04/2017  DOB: April 30, 1950  Follow-Up Visit Note  Outpatient  CC: Sinda Du, MD  Sinda Du, MD  Diagnosis and Prior Radiotherapy:    ICD-10-CM   1. Primary cancer of left lower lobe of lung (HCC) C34.32 CT Chest Wo Contrast    Primary cancer of left lower lobe of lung ([place] II) Staging form: Lung, AJCC 8th Edition - Clinical stage from 04/30/2017: Stage IA2 (cT1b, cN0, cM0) - Signed by Eppie Gibson, MD on 04/30/2017    Radiation treatment dates:   06/03/2017 - 06/09/2017 Site/dose:   Left lung treated to 54 Gy in 3 fractions of 18 Gy  CHIEF COMPLAINT: Here for follow-up and surveillance of left lung cancer  Narrative:  The patient returns today for routine follow-up of radiation completed 1 month ago to his left lung. He denies pain. He does report feeling weak right after completing radiation but now reports increased energy since his original diagnosis. He has had a cold since completing therapy and did take antibiotics and prednisone but has completed therapy. He is still smoking about a pack daily. He tried nicotine patches but had a skin reaction to the patch which included itching and redness. He also reports shortness of breath with activity such as walking a long distance. He reports that he is eating well at this time, but does state that immediately after treatment completed he was not eating "all that much".                           ALLERGIES:  is allergic to bee venom.  Meds: Current Outpatient Medications  Medication Sig Dispense Refill  . albuterol (PROAIR HFA) 108 (90 BASE) MCG/ACT inhaler Inhale 2 puffs into the lungs every 6 (six) hours as needed for wheezing.    Marland Kitchen albuterol (PROVENTIL) 4 MG tablet Take 1 tablet by mouth 2 (two) times daily.  5  . ANORO ELLIPTA 62.5-25 MCG/INH AEPB Inhale 1 puff into the lungs at bedtime.  12  .  aspirin EC 81 MG tablet Take 81 mg by mouth daily.    . montelukast (SINGULAIR) 10 MG tablet Take 10 mg by mouth at bedtime.    . Multiple Vitamins-Minerals (MULTIVITAMIN WITH MINERALS) tablet Take 1 tablet by mouth daily.    . pantoprazole (PROTONIX) 40 MG tablet Take 1 tablet by mouth every morning.  12  . HYDROMET 5-1.5 MG/5ML syrup TK 5 MLS PO QID  0  . LORazepam (ATIVAN) 0.5 MG tablet Take 1 tablet (0.5 mg total) by mouth every 6 (six) hours as needed for anxiety. (Patient not taking: Reported on 07/04/2017) 25 tablet 0  . nicotine (NICODERM CQ - DOSED IN MG/24 HOURS) 21 mg/24hr patch Place 1 patch (21 mg total) onto the skin daily. (Patient not taking: Reported on 07/04/2017) 28 patch 0  . nicotine polacrilex ([employer] NICOTINE) 2 MG lozenge Take 1 lozenge (2 mg total) by mouth as needed for smoking cessation. (Patient not taking: Reported on 07/04/2017) 100 tablet 0   No current facility-administered medications for this encounter.     Physical Findings: The patient is in no acute distress. Patient is alert and oriented.  height is 5\' 9"  (1.753 m) and weight is 153 lb 6.4 oz (69.6 kg). His temperature is 98.2 F (36.8 C). His blood pressure is 132/56 (abnormal) and his pulse is  78.  Ambulatory, breathing room air.  Lab Findings: Lab Results  Component Value Date   WBC 10.6 (H) 04/10/2017   HGB 17.1 (H) 04/10/2017   HCT 47.1 04/10/2017   MCV 104.0 (H) 04/10/2017   PLT 259 04/10/2017    Radiographic Findings: No results found.  Impression/Plan:  Healing from radiotherapy.   I asked the patient today about tobacco use. The patient continues to use tobacco.  I advised the patient to quit. Services were offered by me today including outpatient counseling and pharmacotherapy. I assessed for the willingness to attempt to quit and provided encouragement and demonstrated willingness to make referrals and/or prescriptions to help the patient attempt to quit.  Over 3 minutes were spent on this  issue. The patient verbalized a quit date of August 04, 2017. Due to skin reaction from nicotine patches, he will try using nicotine gum. He was given the tobacco cessation hotline 1-800-QUIT-NOW and a flyer for a smoking cessation program.   We will need to obtain a CT chest scan in 2 months. The patient would like to have this done at [facility]. He will follow up with me one day after this scan to discuss results.    _____________________________________   Eppie Gibson, MD  This document serves as a record of services personally performed by Eppie Gibson, MD. It was created on her behalf by Rae Lips, a trained medical scribe. The creation of this record is based on the scribe's personal observations and the provider's statements to them. This document has been checked and approved by the attending provider.

## 2017-07-14 ENCOUNTER — Other Ambulatory Visit: Payer: Self-pay

## 2017-07-14 ENCOUNTER — Inpatient Hospital Stay (HOSPITAL_COMMUNITY): Payer: BLUE CROSS/BLUE SHIELD | Attending: Hematology and Oncology | Admitting: Hematology

## 2017-07-14 ENCOUNTER — Encounter (HOSPITAL_COMMUNITY): Payer: Self-pay | Admitting: Hematology

## 2017-07-14 VITALS — BP 132/79 | HR 86 | Temp 98.3°F | Resp 20 | Ht 69.0 in | Wt 161.3 lb

## 2017-07-14 DIAGNOSIS — C3432 Malignant neoplasm of lower lobe, left bronchus or lung: Secondary | ICD-10-CM | POA: Insufficient documentation

## 2017-07-14 DIAGNOSIS — Z720 Tobacco use: Secondary | ICD-10-CM | POA: Insufficient documentation

## 2017-07-14 NOTE — Assessment & Plan Note (Signed)
1.  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 Isidore Moos -I will arrange for a CT scan end of May of the chest with contrast and see him 2-3 days after that.  2.  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 Erik Obey.

## 2017-07-14 NOTE — Progress Notes (Signed)
Patient Care Team: Sinda Du, MD as PCP - General (Pulmonary Disease)  DIAGNOSIS:  Encounter Diagnosis  Name Primary?  . Primary cancer of left lower lobe of lung ([place]) Yes    SUMMARY OF ONCOLOGIC HISTORY:   Primary cancer of left lower lobe of lung ([place])   09/05/2016 Imaging    CT Chest: Mild apical scarring noted in the right lung.  Diffuse emphysematous changes bilaterally.  Irregular spiculated nodule in the left upper lobe measuring 2.1 cm.      10/02/2016 PET scan    No hypermetabolic lymphadenopathy in the neck.  No hypermetabolic mediastinal, hilar, or axillary lymph nodes.  Spiculated nodule in the left upper lobe measuring 1.9 cm, SUV max 4.5.  0.8 cm nodule in the left lower lobe with SUV max of 1.7.   no evidence of hypermetabolic lesions in the liver, adrenal glands, spleen, or pancreas.  No abdominal or pelvic lymphadenopathy.  Liver margin appears somewhat irregular with possible cirrhosis.      10/22/2016 Pathology Results    LtUL Nodule Bx: Lung with necrosis, inflammation, and focal calcifications.  No evidence of malignancy.      04/02/2017 PET scan    Left lower lobe lesion measuring 1.3 cm up from 0.8 cm previously, SUV max of 2.9 up from 1.7 in the past.  Left upper lobe nodule SUV max of 3.6 down from 4.5 previously, measuring 2.2 cm, 2.1 cm previously.  Bilateral hypermetabolic carotid lesions noted.      04/10/2017 Initial Diagnosis    Primary cancer of left lower lobe of lung ([place])      04/10/2017 Pathology Results    LtLL Lung Bx: Positive for presence of malignant cells, consistent with squamous cell carcinoma. IHC -- positive for CK5/6, p63 & negative for TTF-1 & Napsin-A       CHIEF COMPLIANT: Follow-up of left lower lobe lung cancer.  INTERVAL HISTORY: Adam Bass is here for follow-up of left lower lobe lung cancer.  He is continuing to smoke about half pack of cigarettes per day.  He felt somewhat tired after radiation, but has  regained his normal strength.  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  He has a 45-year smoking history, 2 packs/day.  He denies any new onset pains.  His appetite is good.  His baseline cough is also normal.  REVIEW OF SYSTEMS:   Constitutional: Denies fevers, chills or abnormal weight loss.  He has mild fatigue. Eyes: Denies blurriness of vision Ears, nose, mouth, throat, and face: Denies mucositis or sore throat Respiratory: He has some cough from seasonal allergies.  He does have shortness of breath on exertion. Cardiovascular: Denies palpitation, chest discomfort Gastrointestinal:  Denies nausea, heartburn or change in bowel habits Skin: Denies abnormal skin rashes Lymphatics: Denies new lymphadenopathy or easy bruising Neurological:Denies numbness, tingling or new weaknesses Behavioral/Psych: Mood is stable, no new changes  Extremities: No lower extremity edema  All other systems were reviewed with the patient and are negative.  I have reviewed the past medical history, past surgical history, social history and family history with the patient and they are unchanged from previous note.  ALLERGIES:  is allergic to bee venom.  MEDICATIONS:  Current Outpatient Medications  Medication Sig Dispense Refill  . albuterol (PROAIR HFA) 108 (90 BASE) MCG/ACT inhaler Inhale 2 puffs into the lungs every 6 (six) hours as needed for wheezing.    Marland Kitchen albuterol (PROVENTIL) 4 MG tablet Take 1 tablet by mouth  2 (two) times daily.  5  . ANORO ELLIPTA 62.5-25 MCG/INH AEPB Inhale 1 puff into the lungs at bedtime.  12  . aspirin EC 81 MG tablet Take 81 mg by mouth daily.    Marland Kitchen HYDROMET 5-1.5 MG/5ML syrup TK 5 MLS PO QID  0  . LORazepam (ATIVAN) 0.5 MG tablet Take 1 tablet (0.5 mg total) by mouth every 6 (six) hours as needed for anxiety. 25 tablet 0  . montelukast (SINGULAIR) 10 MG tablet Take 10 mg by mouth at bedtime.    . Multiple Vitamins-Minerals (MULTIVITAMIN WITH MINERALS) tablet  Take 1 tablet by mouth daily.    . nicotine (NICODERM CQ - DOSED IN MG/24 HOURS) 21 mg/24hr patch Place 1 patch (21 mg total) onto the skin daily. 28 patch 0  . nicotine polacrilex ([employer] NICOTINE) 2 MG lozenge Take 1 lozenge (2 mg total) by mouth as needed for smoking cessation. 100 tablet 0  . pantoprazole (PROTONIX) 40 MG tablet Take 1 tablet by mouth every morning.  12   No current facility-administered medications for this visit.     PHYSICAL EXAMINATION: ECOG PERFORMANCE STATUS: 1 - Symptomatic but completely ambulatory  Vitals:   07/14/17 1600  BP: 132/79  Pulse: 86  Resp: 20  Temp: 98.3 F (36.8 C)  SpO2: 93%   Filed Weights   07/14/17 1600  Weight: 161 lb 4.8 oz (73.2 kg)     LABORATORY DATA:  I have reviewed the data as listed CMP Latest Ref Rng & Units 04/21/2017 12/28/2014  BUN 6 - 20 mg/dL 19 -  Creatinine 0.61 - 1.24 mg/dL 0.83 0.80   No results found for: [id number]   Lab Results  Component Value Date   WBC 10.6 (H) 04/10/2017   HGB 17.1 (H) 04/10/2017   HCT 47.1 04/10/2017   MCV 104.0 (H) 04/10/2017   PLT 259 04/10/2017    ASSESSMENT & PLAN:  Primary cancer of left lower lobe of lung (HCC) 1.  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 Isidore Moos -I will arrange for a CT scan end of May of the chest with contrast and see him 2-3 days after that.  2.  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 Erik Obey.      Orders Placed This Encounter  Procedures  . CT Chest W Contrast    Standing Status:   Future    Standing Expiration Date:   07/14/2018    Order Specific Question:   If indicated for the ordered procedure, I authorize the  administration of contrast media per Radiology protocol    Answer:   Yes    Order Specific Question:   Preferred imaging location?    Answer:   [facility]    Order Specific Question:   Radiology Contrast Protocol - do NOT remove file path    Answer:   [email or web address]    Order Specific Question:   Reason for Exam additional comments    Answer:   Follow-up of the left lower lobe lung cancer, status post radiation therapy   The patient has a good understanding of the overall plan. he agrees with it. he will call with any problems  that may develop before the next visit here.   Derek Jack, MD 07/14/17

## 2017-07-29 DIAGNOSIS — J449 Chronic obstructive pulmonary disease, unspecified: Secondary | ICD-10-CM | POA: Diagnosis not present

## 2017-07-29 DIAGNOSIS — J301 Allergic rhinitis due to pollen: Secondary | ICD-10-CM | POA: Diagnosis not present

## 2017-07-29 DIAGNOSIS — K210 Gastro-esophageal reflux disease with esophagitis: Secondary | ICD-10-CM | POA: Diagnosis not present

## 2017-07-29 DIAGNOSIS — C3491 Malignant neoplasm of unspecified part of right bronchus or lung: Secondary | ICD-10-CM | POA: Diagnosis not present

## 2017-08-07 ENCOUNTER — Ambulatory Visit (INDEPENDENT_AMBULATORY_CARE_PROVIDER_SITE_OTHER): Payer: BLUE CROSS/BLUE SHIELD | Admitting: Internal Medicine

## 2017-08-07 ENCOUNTER — Encounter (INDEPENDENT_AMBULATORY_CARE_PROVIDER_SITE_OTHER): Payer: Self-pay | Admitting: *Deleted

## 2017-08-07 ENCOUNTER — Ambulatory Visit (INDEPENDENT_AMBULATORY_CARE_PROVIDER_SITE_OTHER)
Admission: RE | Admit: 2017-08-07 | Discharge: 2017-08-07 | Disposition: A | Discharge status: Home / Self Care | Payer: BLUE CROSS/BLUE SHIELD | Source: Ambulatory Visit | Attending: Internal Medicine | Admitting: Internal Medicine

## 2017-08-07 ENCOUNTER — Encounter: Payer: Self-pay | Admitting: Internal Medicine

## 2017-08-07 DIAGNOSIS — J449 Chronic obstructive pulmonary disease, unspecified: Secondary | ICD-10-CM

## 2017-08-07 DIAGNOSIS — C3432 Malignant neoplasm of lower lobe, left bronchus or lung: Secondary | ICD-10-CM | POA: Diagnosis not present

## 2017-08-07 DIAGNOSIS — R0602 Shortness of breath: Secondary | ICD-10-CM | POA: Diagnosis not present

## 2017-08-07 MED ORDER — FLUTICASONE-UMECLIDIN-VILANT 100-62.5-25 MCG/INH IN AEPB: 1.0000 | INHALATION_SPRAY | Freq: Every day | RESPIRATORY_TRACT | 0 refills | Status: DC

## 2017-08-07 MED ORDER — FLUTICASONE-UMECLIDIN-VILANT 100-62.5-25 MCG/INH IN AEPB: 1.0000 | INHALATION_SPRAY | Freq: Every day | RESPIRATORY_TRACT | 3 refills | Status: DC

## 2017-08-07 NOTE — Progress Notes (Signed)
Subjective:     Patient ID: Adam Bass, male   DOB: 04/20/1950,    MRN: [id number]  HPI  47 yowm active cigar smoker changed form cigs around 2005 dx as copd 2011 maintained on spiriva and then anoro  referred to pulmonary clinic 08/07/2017 by Dr Adam Bass for sob/ followed by Dr Adam Bass    Primary cancer of left lower lobe of lung ([place]) LtLL Lung Bx: Positive for presence of malignant cells, consistent with squamous cell carcinoma. IHC -- positive for CK5/6, p63 & negative for TTF-1 & Napsin-A  - Clinical stage from 04/30/2017: Stage IA2 (cT1b, cN0, cM0) - Signed by Adam Gibson, MD on 04/30/2017  Radiation treatment dates:06/03/2017 - 06/09/2017 Site/dose:Left lung treated to 54 Gy in 3 fractions of 18 Gy     08/07/2017 1st [facility] office visit/ Wert  GOLD III copd / on anoro maint  Chief Complaint  Patient presents with  . Consult    Found it follow up on a previous noudle, started radiation 3 treatment. Follow up ct next month, Wheezing and some cough.  on anoro daily baseline rare saba need   but for at least one month feeling needs saba rx every  day and not usually  noct Assoc cough is mucoid/ worse in am/ still smoking cigars  Doe = MMRC3 = can't walk 100 yards even at a slow pace at a flat grade s stopping due to sob     No obvious day to day or daytime variability or assoc  purulent sputum or mucus plugs or hemoptysis or cp or chest tightness, subjective wheeze or overt sinus or hb symptoms. No unusual exposure hx or h/o childhood pna/ asthma or knowledge of premature birth.  Sleeping  Ok flat  without nocturnal    exacerbation  of respiratory  c/o's or need for noct saba. Also denies any obvious fluctuation of symptoms with weather or environmental changes or other aggravating or alleviating factors except as outlined above   Current Allergies, Complete Past Medical History, Past Surgical History, Family History, and Social History were reviewed in  [employer] record.  ROS  The following are not active complaints unless bolded Hoarseness, sore throat, dysphagia, dental problems, itching, sneezing,  nasal congestion or discharge of excess mucus or purulent secretions, ear ache,   fever, chills, sweats, unintended wt loss or wt gain, classically pleuritic or exertional cp,  orthopnea pnd or arm/hand swelling  or leg swelling, presyncope, palpitations, abdominal pain, anorexia, nausea, vomiting, diarrhea  or change in bowel habits or change in bladder habits, change in stools or change in urine, dysuria, hematuria,  rash, arthralgias, visual complaints, headache, numbness, weakness or ataxia or problems with walking or coordination,  change in mood or  memory.        Current Meds  Medication Sig  . albuterol (PROAIR HFA) 108 (90 BASE) MCG/ACT inhaler Inhale 2 puffs into the lungs every 6 (six) hours as needed for wheezing.  Marland Kitchen albuterol (PROVENTIL) 4 MG tablet Take 1 tablet by mouth 2 (two) times daily.  Adam Bass ELLIPTA 62.5-25 MCG/INH AEPB Inhale 1 puff into the lungs at bedtime.  Marland Kitchen aspirin EC 81 MG tablet Take 81 mg by mouth daily.  Marland Kitchen HYDROMET 5-1.5 MG/5ML syrup TK 5 MLS PO QID  . LORazepam (ATIVAN) 0.5 MG tablet Take 1 tablet (0.5 mg total) by mouth every 6 (six) hours as needed for anxiety.  . montelukast (SINGULAIR) 10 MG tablet Take 10 mg by mouth  at bedtime.  . Multiple Vitamins-Minerals (MULTIVITAMIN WITH MINERALS) tablet Take 1 tablet by mouth daily.  . pantoprazole (PROTONIX) 40 MG tablet Take 1 tablet by mouth every morning.      Review of Systems     Objective:   Physical Exam amb pleasant wm nad  Wt Readings from Last 3 Encounters:  08/07/17 174 lb (78.9 kg)  07/14/17 161 lb 4.8 oz (73.2 kg)  07/04/17 153 lb 6.4 oz (69.6 kg)     Vital signs reviewed - Note on arrival 02 sats  94% on RA    HEENT: nl dentition / oropharynx. Nl external ear canals without cough reflex - mild bilateral  non-specific turbinate edema     NECK :  without JVD/Nodes/TM/ nl carotid upstrokes bilaterally   LUNGS: no acc muscle use,  Mild barrel  contour chest wall with bilateral  Distant bs s audible wheeze and  without cough on insp or exp maneuver and mild   Hyperresonant  to  percussion bilaterally     CV:  RRR  no s3 or murmur or increase in P2, and no edema   ABD:  soft and nontender with pos mid insp Hoover's  in the supine position. No bruits or organomegaly appreciated, bowel sounds nl  MS:   Nl gait/  ext warm without deformities, calf tenderness, cyanosis or clubbing No obvious joint restrictions   SKIN: warm and dry without lesions    NEURO:  alert, approp, nl sensorium with  no motor or cerebellar deficits apparent.        CXR PA and Lateral:   08/07/2017 :    I personally reviewed images and  impression as follows:   Moderately severe copd/ L apical nodule unchanged / no rt changes apparent       Assessment:

## 2017-08-07 NOTE — Patient Instructions (Addendum)
If not improving >> Prednisone 10 mg take  4 each am x 2 days,   2 each am x 2 days,  1 each am x 2 days and stop   Plan A = Automatic = Trelegy one click first thing each am, take two drags   Plan B = Backup Only use your albuterol as a rescue medication to be used if you can't catch your breath by resting or doing a relaxed purse lip breathing pattern.  - The less you use it, the better it will work when you need it. - Ok to use the inhaler up to 2 puffs  every 4 hours if you must but call for appointment if use goes up over your usual need - Don't leave home without it !!  (think of it like the spare tire for your car)   Plan C = Crisis - only use your albuterol nebulizer if you first try Plan B and it fails to help > ok to use the nebulizer up to every 4 hours but if start needing it regularly call for immediate appointment   Please remember to go to the  x-ray department downstairs in the basement  for your tests - we will call you with the results when they are available.       If you are satisfied with your treatment plan,  let your doctor know and he/she can either refill your medications or you can return here when your prescription runs out.     If in any way you are not 100% satisfied,  please tell us.  If 100% better, tell your friends!  Pulmonary follow up is as needed

## 2017-08-08 ENCOUNTER — Encounter: Payer: Self-pay | Admitting: Internal Medicine

## 2017-08-08 NOTE — Progress Notes (Signed)
Spoke with pt and notified of results per Dr. Wert. Pt verbalized understanding and denied any questions. 

## 2017-08-08 NOTE — Assessment & Plan Note (Addendum)
PFT's  08/07/2017  FEV1 1.52 (47 % ) ratio 37  p 17 % improvement from saba p ? prior to study with DLCO  37/36 % corrects to 38  % for alv volume   - 08/07/2017  After extensive coaching inhaler device  effectiveness =    90% with DPI and 50% at best with hfa   Although not having a typical aecopd, he is worse over the last month and would probably benefit from trial of ICS as he does have significant reversibility by previous pfts and since familiar with dpi rec : trelegy one click each am and if not able to master hfa then consider Proair respiclick when it comes time to change the ventolin (has a full one today and I have no samples or would have done so today based on how well he does dpi vs hfa)  If not improving he can take pred x 6 days   He has Dr Ermalinda Memos for pcp so should f/u there and return here prn   Total time devoted to counseling  > 50 % of initial 45 min office visit:  review case with pt/ discussion of options/alternatives/ personally creating written customized instructions  in presence of pt  then going over those specific  Instructions directly with the pt including how to use all of the meds but in particular covering each new medication in detail and the difference between the maintenance= "automatic" meds and the prns using an action plan format for the latter (If this problem/symptom => do that organization reading Left to right).  Please see AVS from this visit for a full list of these instructions which I personally wrote for this pt and  are unique to this visit.

## 2017-08-08 NOTE — Assessment & Plan Note (Signed)
Primary cancer of left lower lobe of lung (HCC) LtLL Lung Bx: Positive for presence of malignant cells, consistent with squamous cell carcinoma. IHC -- positive for CK5/6, p63 & negative for TTF-1 & Napsin-A  - Clinical stage from 04/30/2017: Stage IA2 (cT1b, cN0, cM0) - Signed by Eppie Gibson, MD on 04/30/2017  Radiation treatment dates:06/03/2017 - 06/09/2017 Site/dose:Left lung treated to 54 Gy in 3 fractions of 18 Gy  No evidence RT pneumonitis at present > f/u RT/onc planned

## 2017-08-25 ENCOUNTER — Telehealth: Payer: Self-pay | Admitting: *Deleted

## 2017-08-25 NOTE — Telephone Encounter (Signed)
Called patient to inform of CT for 09-04-17- arrival time- 5:45 pm @ [facility], no restrictions to test, lvm for a return call

## 2017-09-03 ENCOUNTER — Telehealth: Payer: Self-pay | Admitting: *Deleted

## 2017-09-03 DIAGNOSIS — L570 Actinic keratosis: Secondary | ICD-10-CM | POA: Diagnosis not present

## 2017-09-03 DIAGNOSIS — Z85828 Personal history of other malignant neoplasm of skin: Secondary | ICD-10-CM | POA: Diagnosis not present

## 2017-09-03 DIAGNOSIS — B356 Tinea cruris: Secondary | ICD-10-CM | POA: Diagnosis not present

## 2017-09-03 DIAGNOSIS — D485 Neoplasm of uncertain behavior of skin: Secondary | ICD-10-CM | POA: Diagnosis not present

## 2017-09-03 NOTE — Telephone Encounter (Signed)
Returned patients phone call   

## 2017-09-04 ENCOUNTER — Ambulatory Visit (HOSPITAL_COMMUNITY)
Admission: RE | Admit: 2017-09-04 | Discharge: 2017-09-04 | Disposition: A | Discharge status: Home / Self Care | Payer: BLUE CROSS/BLUE SHIELD | Source: Ambulatory Visit | Attending: Radiation Oncology | Admitting: Radiation Oncology

## 2017-09-04 DIAGNOSIS — J439 Emphysema, unspecified: Secondary | ICD-10-CM | POA: Insufficient documentation

## 2017-09-04 DIAGNOSIS — I2510 Atherosclerotic heart disease of native coronary artery without angina pectoris: Secondary | ICD-10-CM | POA: Diagnosis not present

## 2017-09-04 DIAGNOSIS — C3432 Malignant neoplasm of lower lobe, left bronchus or lung: Secondary | ICD-10-CM | POA: Insufficient documentation

## 2017-09-04 DIAGNOSIS — I700 Atherosclerosis of aorta: Secondary | ICD-10-CM | POA: Insufficient documentation

## 2017-09-04 DIAGNOSIS — R911 Solitary pulmonary nodule: Secondary | ICD-10-CM | POA: Diagnosis not present

## 2017-09-04 NOTE — Progress Notes (Signed)
Adam Bass presents for follow up of radiation completed 06/09/17 to his Left Lung. He continues to report fatigue. He denies pain. He reports shortness of breath with activity. He reports a clear productive cough. He has a decreased appetite. He is here for results of his CT Chest completed 09/04/17. He reports he is still smoking and drinking about 24 beers weekly. He will see Dr. Delton Coombes next on 09/22/17.  BP 130/66 (BP Location: Left Arm, Patient Position: Sitting, Cuff Size: Normal)   Pulse 75   Temp 98.1 F (36.7 C) (Oral)   Resp 20   Ht 5\' 9"  (1.753 m)   Wt 146 lb 9.6 oz (66.5 kg)   SpO2 96%   BMI 21.65 kg/m    Wt Readings from Last 3 Encounters:  09/12/17 146 lb 9.6 oz (66.5 kg)  08/07/17 174 lb (78.9 kg)  07/14/17 161 lb 4.8 oz (73.2 kg)  the patient believes that the weight on 08/07/17 is an error. He states he has never weighed that much. He is usually weighs in the 150's.

## 2017-09-05 ENCOUNTER — Ambulatory Visit
Admission: RE | Admit: 2017-09-05 | Payer: BLUE CROSS/BLUE SHIELD | Source: Ambulatory Visit | Admitting: Radiation Oncology

## 2017-09-12 ENCOUNTER — Encounter: Payer: Self-pay | Admitting: Radiation Oncology

## 2017-09-12 ENCOUNTER — Other Ambulatory Visit: Payer: Self-pay

## 2017-09-12 ENCOUNTER — Ambulatory Visit
Admission: RE | Admit: 2017-09-12 | Discharge: 2017-09-12 | Disposition: A | Discharge status: Home / Self Care | Payer: BLUE CROSS/BLUE SHIELD | Source: Ambulatory Visit | Attending: Radiation Oncology | Admitting: Radiation Oncology

## 2017-09-12 VITALS — BP 130/66 | HR 75 | Temp 98.1°F | Resp 20 | Ht 69.0 in | Wt 146.6 lb

## 2017-09-12 DIAGNOSIS — Z7982 Long term (current) use of aspirin: Secondary | ICD-10-CM | POA: Diagnosis not present

## 2017-09-12 DIAGNOSIS — J449 Chronic obstructive pulmonary disease, unspecified: Secondary | ICD-10-CM | POA: Diagnosis not present

## 2017-09-12 DIAGNOSIS — R918 Other nonspecific abnormal finding of lung field: Secondary | ICD-10-CM | POA: Insufficient documentation

## 2017-09-12 DIAGNOSIS — Z79899 Other long term (current) drug therapy: Secondary | ICD-10-CM | POA: Insufficient documentation

## 2017-09-12 DIAGNOSIS — Z85118 Personal history of other malignant neoplasm of bronchus and lung: Secondary | ICD-10-CM | POA: Diagnosis not present

## 2017-09-12 DIAGNOSIS — I700 Atherosclerosis of aorta: Secondary | ICD-10-CM | POA: Diagnosis not present

## 2017-09-12 DIAGNOSIS — Z923 Personal history of irradiation: Secondary | ICD-10-CM | POA: Diagnosis not present

## 2017-09-12 DIAGNOSIS — Z08 Encounter for follow-up examination after completed treatment for malignant neoplasm: Secondary | ICD-10-CM | POA: Diagnosis not present

## 2017-09-12 DIAGNOSIS — F17210 Nicotine dependence, cigarettes, uncomplicated: Secondary | ICD-10-CM | POA: Diagnosis not present

## 2017-09-12 DIAGNOSIS — C3432 Malignant neoplasm of lower lobe, left bronchus or lung: Secondary | ICD-10-CM | POA: Diagnosis not present

## 2017-09-12 NOTE — Progress Notes (Signed)
Radiation Oncology         [phone] ________________________________  Name: Adam Bass MRN: [id number]  Date: 09/12/2017  DOB: 09/06/1950  Follow-Up Visit Note  Outpatient  CC: Sinda Du, MD  Ardath Sax, MD  Diagnosis and Prior Radiotherapy:    ICD-10-CM   1. Primary cancer of left lower lobe of lung (HCC) C34.32 CT Chest Wo Contrast    Primary cancer of left lower lobe of lung ([place]) Staging form: Lung, AJCC 8th Edition - Clinical stage from 04/30/2017: Stage IA2 (cT1b, cN0, cM0) - Signed by Eppie Gibson, MD on 04/30/2017    06/03/17 - 06/09/17 54 Gy directed to the Left Lung delivered in 3 fractions of 18 Gy every other day.  CHIEF COMPLAINT: Here for follow-up and surveillance of left lower lung cancer  Narrative:  The patient returns today for routine follow-up.  He continues to report fatigue. He denies pain. He reports shortness of breath with activity. He reports a clear productive cough. He has a decreased appetite. He is here for results of his CT Chest completed 09/04/17, which demonstrates a decrease in size of the treated left lower lobe pulmonary nodule and left upper lobe lung lesion is stable. There is a new patchy area of nodularity within the posterior right lung base which appears post infectious/ inflammatory. He reports he is still smoking and drinking about 24 beers weekly.  His daughter was here with him and was concerned about him working and being in the heat.   I personally reviewed his imaging.                               ALLERGIES:  is allergic to bee venom.  Meds: Current Outpatient Medications  Medication Sig Dispense Refill  . albuterol (PROAIR HFA) 108 (90 BASE) MCG/ACT inhaler Inhale 2 puffs into the lungs every 6 (six) hours as needed for wheezing.    Marland Kitchen albuterol (PROVENTIL) 4 MG tablet Take 1 tablet by mouth 2 (two) times daily.  5  . aspirin EC 81 MG tablet Take 81 mg by mouth daily.    . Fluticasone-Umeclidin-Vilant  (TRELEGY ELLIPTA) 100-62.5-25 MCG/INH AEPB Inhale 1 puff into the lungs daily. 1 each 3  . montelukast (SINGULAIR) 10 MG tablet Take 10 mg by mouth at bedtime.    . Multiple Vitamins-Minerals (MULTIVITAMIN WITH MINERALS) tablet Take 1 tablet by mouth daily.    . pantoprazole (PROTONIX) 40 MG tablet Take 1 tablet by mouth every morning.  12  . HYDROMET 5-1.5 MG/5ML syrup TK 5 MLS PO QID  0  . LORazepam (ATIVAN) 0.5 MG tablet Take 1 tablet (0.5 mg total) by mouth every 6 (six) hours as needed for anxiety. (Patient not taking: Reported on 09/12/2017) 25 tablet 0   No current facility-administered medications for this encounter.     Physical Findings: The patient is in no acute distress. Patient is alert and oriented.  height is 5\' 9"  (1.753 m) and weight is 146 lb 9.6 oz (66.5 kg). His oral temperature is 98.1 F (36.7 C). His blood pressure is 130/66 and his pulse is 75. His respiration is 20 and oxygen saturation is 96%. Marland Kitchen    Heart: Regular in rate and rhythm with no murmurs, rubs, or gallops. Chest: Clear to auscultation bilaterally, with no rhonchi, wheezes, or rales.  Lab Findings: Lab Results  Component Value Date   WBC 10.6 (H) 04/10/2017   HGB 17.1 (  H) 04/10/2017   HCT 47.1 04/10/2017   MCV 104.0 (H) 04/10/2017   PLT 259 04/10/2017    Radiographic Findings: Ct Chest Wo Contrast  Result Date: 09/05/2017 CLINICAL DATA:  Followup lung cancer. Left lower lobe tumor. Status post radiation therapy. EXAM: CT CHEST WITHOUT CONTRAST TECHNIQUE: Multidetector CT imaging of the chest was performed following the standard protocol without IV contrast. COMPARISON:  02/19/2017 FINDINGS: Cardiovascular: The heart size appears normal. Aortic atherosclerosis. Calcification within the RCA, LAD and left circumflex coronary artery. Mediastinum/Nodes: Normal appearance of the thyroid gland. The trachea appears patent and is midline. Normal appearance of the esophagus. No enlarged mediastinal or hilar  lymph nodes. Lungs/Pleura: No pleural effusion. Advanced changes of emphysema again noted. Diffuse bronchial wall thickening noted. No airspace consolidation, atelectasis or pneumothorax. The index lesion within the posterior left upper lobe measures 2.3 by 1.1 by 1.1 cm, image 38/4 and image 112/6. Unchanged. The index lesion within the left lower lobe measures 0.8 x 0.7 cm, image 121/4. Previously 0.9 x 1.1 cm.New peripheral nodular densities within the posterior right lung base are likely post inflammatory or infectious in etiology, image 164/4. Upper Abdomen: No acute abnormality. Musculoskeletal: No chest wall mass or suspicious bone lesions identified. IMPRESSION: 1. Decrease in size of left lower lobe pulmonary nodule. The left upper lobe lung lesion is stable in the interval. 2. Diffuse bronchial wall thickening with emphysema, as above; imaging findings suggestive of underlying COPD. Emphysema (ICD10-J43.9). 3. Aortic atherosclerosis and 3 vessel coronary artery atherosclerotic calcifications. Aortic Atherosclerosis (ICD10-I70.0). 4. New patchy area of nodularity within the posterior right lung base appears post infectious/inflammatory. Attention in this area on follow-up imaging. Electronically Signed   By: Kerby Moors M.D.   On: 09/05/2017 10:14    Impression/Plan:  This is a very nice gentleman with a history of left lung cancer. Lower lobe; responded well to SBRT.   Advised patient to quit smoking to help with recovering.  Call [phone] QUIT NOW.  I will see him in 3 months and order CT chest for that time. Patient will see Dr. Delton Coombes on 09/22/17 at [facility] hospital. Patient asked about how to take time off work via [facility] due to COPD and heat, informed patient to speak with provider who manages his COPD.   _____________________________________   Eppie Gibson, MD  This document serves as a record of services personally performed by Eppie Gibson MD. It was created on her behalf by Delton Coombes, a trained medical scribe. The creation of this record is based on the scribe's personal observations and the provider's statements to them.

## 2017-09-16 ENCOUNTER — Other Ambulatory Visit (HOSPITAL_COMMUNITY): Payer: Self-pay | Admitting: *Deleted

## 2017-09-16 DIAGNOSIS — C3432 Malignant neoplasm of lower lobe, left bronchus or lung: Secondary | ICD-10-CM

## 2017-09-17 ENCOUNTER — Ambulatory Visit (HOSPITAL_COMMUNITY): Payer: BLUE CROSS/BLUE SHIELD

## 2017-09-17 ENCOUNTER — Inpatient Hospital Stay (HOSPITAL_COMMUNITY): Payer: BLUE CROSS/BLUE SHIELD | Attending: Hematology

## 2017-09-17 DIAGNOSIS — C3432 Malignant neoplasm of lower lobe, left bronchus or lung: Secondary | ICD-10-CM | POA: Diagnosis not present

## 2017-09-17 LAB — CBC WITH DIFFERENTIAL/PLATELET
BASOS ABS: 0 10*3/uL (ref 0.0–0.1)
Basophils Relative: 0 %
Eosinophils Absolute: 0.3 10*3/uL (ref 0.0–0.7)
Eosinophils Relative: 2 %
HEMATOCRIT: 47.2 % (ref 39.0–52.0)
Hemoglobin: 16.7 g/dL (ref 13.0–17.0)
LYMPHS ABS: 3.7 10*3/uL (ref 0.7–4.0)
LYMPHS PCT: 32 %
MCH: 36.5 pg — AB (ref 26.0–34.0)
MCHC: 35.4 g/dL (ref 30.0–36.0)
MCV: 103.3 fL — AB (ref 78.0–100.0)
MONO ABS: 1.2 10*3/uL — AB (ref 0.1–1.0)
Monocytes Relative: 10 %
NEUTROS ABS: 6.4 10*3/uL (ref 1.7–7.7)
Neutrophils Relative %: 56 %
Platelets: 277 10*3/uL (ref 150–400)
RBC: 4.57 MIL/uL (ref 4.22–5.81)
RDW: 11.2 % — AB (ref 11.5–15.5)
WBC: 11.6 10*3/uL — ABNORMAL HIGH (ref 4.0–10.5)

## 2017-09-17 LAB — COMPREHENSIVE METABOLIC PANEL
ALT: 101 U/L — AB (ref 17–63)
AST: 90 U/L — AB (ref 15–41)
Albumin: 3.7 g/dL (ref 3.5–5.0)
Alkaline Phosphatase: 126 U/L (ref 38–126)
Anion gap: 9 (ref 5–15)
BILIRUBIN TOTAL: 1.1 mg/dL (ref 0.3–1.2)
BUN: 14 mg/dL (ref 6–20)
CO2: 27 mmol/L (ref 22–32)
CREATININE: 0.85 mg/dL (ref 0.61–1.24)
Calcium: 9 mg/dL (ref 8.9–10.3)
Chloride: 103 mmol/L (ref 101–111)
GFR calc Af Amer: >60 mL/min (ref >60)
GFR calc non Af Amer: >60 mL/min (ref >60)
Glucose, Bld: 139 mg/dL — ABNORMAL HIGH (ref 65–99)
POTASSIUM: 4 mmol/L (ref 3.5–5.1)
Sodium: 139 mmol/L (ref 135–145)
TOTAL PROTEIN: 7.2 g/dL (ref 6.5–8.1)

## 2017-09-22 ENCOUNTER — Ambulatory Visit (HOSPITAL_COMMUNITY): Payer: BLUE CROSS/BLUE SHIELD | Admitting: Hematology

## 2017-11-11 DIAGNOSIS — J439 Emphysema, unspecified: Secondary | ICD-10-CM | POA: Diagnosis not present

## 2017-11-11 DIAGNOSIS — J449 Chronic obstructive pulmonary disease, unspecified: Secondary | ICD-10-CM | POA: Diagnosis not present

## 2017-11-11 DIAGNOSIS — Z6821 Body mass index (BMI) 21.0-21.9, adult: Secondary | ICD-10-CM | POA: Diagnosis not present

## 2017-11-11 DIAGNOSIS — Z1389 Encounter for screening for other disorder: Secondary | ICD-10-CM | POA: Diagnosis not present

## 2017-11-11 DIAGNOSIS — C3432 Malignant neoplasm of lower lobe, left bronchus or lung: Secondary | ICD-10-CM | POA: Diagnosis not present

## 2017-11-23 ENCOUNTER — Encounter (HOSPITAL_COMMUNITY)
Admission: EM | Disposition: A | Discharge status: Home / Self Care | Payer: Self-pay | Source: Home / Self Care | Attending: Interventional Cardiology

## 2017-11-23 ENCOUNTER — Inpatient Hospital Stay (HOSPITAL_COMMUNITY)
Admission: EM | Admit: 2017-11-23 | Discharge: 2017-11-27 | DRG: 247 | Disposition: A | Discharge status: Home / Self Care | Payer: BLUE CROSS/BLUE SHIELD | Attending: Interventional Cardiology | Admitting: Interventional Cardiology

## 2017-11-23 ENCOUNTER — Emergency Department (HOSPITAL_COMMUNITY): Payer: BLUE CROSS/BLUE SHIELD

## 2017-11-23 DIAGNOSIS — Z79899 Other long term (current) drug therapy: Secondary | ICD-10-CM

## 2017-11-23 DIAGNOSIS — R202 Paresthesia of skin: Secondary | ICD-10-CM | POA: Diagnosis not present

## 2017-11-23 DIAGNOSIS — I2111 ST elevation (STEMI) myocardial infarction involving right coronary artery: Secondary | ICD-10-CM

## 2017-11-23 DIAGNOSIS — R945 Abnormal results of liver function studies: Secondary | ICD-10-CM | POA: Diagnosis not present

## 2017-11-23 DIAGNOSIS — I2119 ST elevation (STEMI) myocardial infarction involving other coronary artery of inferior wall: Secondary | ICD-10-CM | POA: Diagnosis not present

## 2017-11-23 DIAGNOSIS — R0689 Other abnormalities of breathing: Secondary | ICD-10-CM | POA: Diagnosis not present

## 2017-11-23 DIAGNOSIS — Z7982 Long term (current) use of aspirin: Secondary | ICD-10-CM | POA: Diagnosis not present

## 2017-11-23 DIAGNOSIS — R7989 Other specified abnormal findings of blood chemistry: Secondary | ICD-10-CM

## 2017-11-23 DIAGNOSIS — R531 Weakness: Secondary | ICD-10-CM | POA: Diagnosis not present

## 2017-11-23 DIAGNOSIS — D7589 Other specified diseases of blood and blood-forming organs: Secondary | ICD-10-CM | POA: Diagnosis not present

## 2017-11-23 DIAGNOSIS — Z85828 Personal history of other malignant neoplasm of skin: Secondary | ICD-10-CM | POA: Diagnosis not present

## 2017-11-23 DIAGNOSIS — I959 Hypotension, unspecified: Secondary | ICD-10-CM | POA: Diagnosis present

## 2017-11-23 DIAGNOSIS — I499 Cardiac arrhythmia, unspecified: Secondary | ICD-10-CM | POA: Diagnosis not present

## 2017-11-23 DIAGNOSIS — Z85118 Personal history of other malignant neoplasm of bronchus and lung: Secondary | ICD-10-CM | POA: Diagnosis not present

## 2017-11-23 DIAGNOSIS — I252 Old myocardial infarction: Secondary | ICD-10-CM

## 2017-11-23 DIAGNOSIS — Z23 Encounter for immunization: Secondary | ICD-10-CM

## 2017-11-23 DIAGNOSIS — E785 Hyperlipidemia, unspecified: Secondary | ICD-10-CM | POA: Diagnosis not present

## 2017-11-23 DIAGNOSIS — R079 Chest pain, unspecified: Secondary | ICD-10-CM | POA: Diagnosis not present

## 2017-11-23 DIAGNOSIS — I219 Acute myocardial infarction, unspecified: Secondary | ICD-10-CM

## 2017-11-23 DIAGNOSIS — I2510 Atherosclerotic heart disease of native coronary artery without angina pectoris: Secondary | ICD-10-CM | POA: Diagnosis not present

## 2017-11-23 DIAGNOSIS — I213 ST elevation (STEMI) myocardial infarction of unspecified site: Secondary | ICD-10-CM | POA: Diagnosis not present

## 2017-11-23 DIAGNOSIS — Z923 Personal history of irradiation: Secondary | ICD-10-CM | POA: Diagnosis not present

## 2017-11-23 DIAGNOSIS — F17210 Nicotine dependence, cigarettes, uncomplicated: Secondary | ICD-10-CM | POA: Diagnosis present

## 2017-11-23 DIAGNOSIS — F1010 Alcohol abuse, uncomplicated: Secondary | ICD-10-CM | POA: Diagnosis not present

## 2017-11-23 DIAGNOSIS — F17200 Nicotine dependence, unspecified, uncomplicated: Secondary | ICD-10-CM

## 2017-11-23 DIAGNOSIS — Z720 Tobacco use: Secondary | ICD-10-CM | POA: Diagnosis not present

## 2017-11-23 DIAGNOSIS — Z7951 Long term (current) use of inhaled steroids: Secondary | ICD-10-CM

## 2017-11-23 DIAGNOSIS — I361 Nonrheumatic tricuspid (valve) insufficiency: Secondary | ICD-10-CM | POA: Diagnosis not present

## 2017-11-23 DIAGNOSIS — R0789 Other chest pain: Secondary | ICD-10-CM | POA: Diagnosis not present

## 2017-11-23 DIAGNOSIS — J449 Chronic obstructive pulmonary disease, unspecified: Secondary | ICD-10-CM | POA: Diagnosis present

## 2017-11-23 DIAGNOSIS — Z955 Presence of coronary angioplasty implant and graft: Secondary | ICD-10-CM

## 2017-11-23 HISTORY — DX: Old myocardial infarction: I25.2

## 2017-11-23 HISTORY — PX: CORONARY STENT INTERVENTION: CATH118234

## 2017-11-23 HISTORY — PX: LEFT HEART CATH AND CORONARY ANGIOGRAPHY: CATH118249

## 2017-11-23 HISTORY — DX: Presence of coronary angioplasty implant and graft: Z95.5

## 2017-11-23 HISTORY — DX: Acute myocardial infarction, unspecified: I21.9

## 2017-11-23 LAB — COMPREHENSIVE METABOLIC PANEL
ALT: UNDETERMINED U/L (ref 0–44)
AST: 106 U/L — ABNORMAL HIGH (ref 15–41)
Albumin: 3.5 g/dL (ref 3.5–5.0)
Alkaline Phosphatase: 112 U/L (ref 38–126)
Anion gap: 15 (ref 5–15)
BILIRUBIN TOTAL: UNDETERMINED mg/dL (ref 0.3–1.2)
BUN: 11 mg/dL (ref 8–23)
CHLORIDE: 106 mmol/L (ref 98–111)
CO2: 16 mmol/L — ABNORMAL LOW (ref 22–32)
CREATININE: 0.71 mg/dL (ref 0.61–1.24)
Calcium: 9 mg/dL (ref 8.9–10.3)
GFR calc non Af Amer: >60 mL/min (ref >60)
Glucose, Bld: 198 mg/dL — ABNORMAL HIGH (ref 70–99)
POTASSIUM: 4.2 mmol/L (ref 3.5–5.1)
Sodium: 137 mmol/L (ref 135–145)
TOTAL PROTEIN: 6.8 g/dL (ref 6.5–8.1)

## 2017-11-23 LAB — CBC WITH DIFFERENTIAL/PLATELET
Abs Immature Granulocytes: 0.1 10*3/uL (ref 0.0–0.1)
BASOS ABS: 0.1 10*3/uL (ref 0.0–0.1)
BASOS PCT: 1 %
Eosinophils Absolute: 0.2 10*3/uL (ref 0.0–0.7)
Eosinophils Relative: 2 %
HCT: 48.7 % (ref 39.0–52.0)
Hemoglobin: 17 g/dL (ref 13.0–17.0)
IMMATURE GRANULOCYTES: 1 %
Lymphocytes Relative: 24 %
Lymphs Abs: 2.7 10*3/uL (ref 0.7–4.0)
MCH: 37 pg — ABNORMAL HIGH (ref 26.0–34.0)
MCHC: 34.9 g/dL (ref 30.0–36.0)
MCV: 105.9 fL — ABNORMAL HIGH (ref 78.0–100.0)
Monocytes Absolute: 1 10*3/uL (ref 0.1–1.0)
Monocytes Relative: 9 %
NEUTROS PCT: 63 %
Neutro Abs: 7 10*3/uL (ref 1.7–7.7)
PLATELETS: 222 10*3/uL (ref 150–400)
RBC: 4.6 MIL/uL (ref 4.22–5.81)
RDW: 11.8 % (ref 11.5–15.5)
WBC: 11 10*3/uL — AB (ref 4.0–10.5)

## 2017-11-23 LAB — CBC
HCT: 50.1 % (ref 39.0–52.0)
Hemoglobin: 17.4 g/dL — ABNORMAL HIGH (ref 13.0–17.0)
MCH: 38.1 pg — AB (ref 26.0–34.0)
MCHC: 34.7 g/dL (ref 30.0–36.0)
MCV: 109.6 fL — ABNORMAL HIGH (ref 78.0–100.0)
PLATELETS: 230 10*3/uL (ref 150–400)
RBC: 4.57 MIL/uL (ref 4.22–5.81)
RDW: 12.2 % (ref 11.5–15.5)
WBC: 11.1 10*3/uL — ABNORMAL HIGH (ref 4.0–10.5)

## 2017-11-23 LAB — LIPID PANEL
CHOLESTEROL: 146 mg/dL (ref 0–200)
HDL: 35 mg/dL — ABNORMAL LOW (ref >40)
LDL Cholesterol: 100 mg/dL — ABNORMAL HIGH (ref 0–99)
Total CHOL/HDL Ratio: 4.2 RATIO
Triglycerides: 53 mg/dL (ref <150)
VLDL: 11 mg/dL (ref 0–40)

## 2017-11-23 LAB — POCT I-STAT, CHEM 8
BUN: 12 mg/dL (ref 8–23)
CHLORIDE: 95 mmol/L — AB (ref 98–111)
CREATININE: 0.5 mg/dL — AB (ref 0.61–1.24)
Calcium, Ion: 1.16 mmol/L (ref 1.15–1.40)
GLUCOSE: 127 mg/dL — AB (ref 70–99)
HEMATOCRIT: 46 % (ref 39.0–52.0)
HEMOGLOBIN: 15.6 g/dL (ref 13.0–17.0)
POTASSIUM: 3.7 mmol/L (ref 3.5–5.1)
Sodium: 132 mmol/L — ABNORMAL LOW (ref 135–145)
TCO2: 23 mmol/L (ref 22–32)

## 2017-11-23 LAB — POCT ACTIVATED CLOTTING TIME: Activated Clotting Time: 285 seconds

## 2017-11-23 LAB — I-STAT TROPONIN, ED: Troponin i, poc: 0.02 ng/mL (ref 0.00–0.08)

## 2017-11-23 LAB — APTT: APTT: 140 s — AB (ref 24–36)

## 2017-11-23 LAB — PROTIME-INR
INR: 1.17
Prothrombin Time: 14.8 seconds (ref 11.4–15.2)

## 2017-11-23 LAB — TROPONIN I
TROPONIN I: 0.02 ng/mL (ref <0.03)
Troponin I: 2.67 ng/mL (ref <0.03)

## 2017-11-23 LAB — MRSA PCR SCREENING: MRSA BY PCR: NEGATIVE

## 2017-11-23 SURGERY — LEFT HEART CATH AND CORONARY ANGIOGRAPHY
Anesthesia: LOCAL

## 2017-11-23 MED ORDER — TICAGRELOR 90 MG PO TABS
ORAL_TABLET | ORAL | Status: DC | PRN
  Administered 2017-11-23: 180 mg via ORAL

## 2017-11-23 MED ORDER — VERAPAMIL HCL 2.5 MG/ML IV SOLN
INTRAVENOUS | Status: AC
  Filled 2017-11-23: qty 2

## 2017-11-23 MED ORDER — TIROFIBAN HCL IV 12.5 MG/250 ML
0.1500 ug/kg/min | INTRAVENOUS | Status: AC
  Administered 2017-11-23: 0.15 ug/kg/min via INTRAVENOUS

## 2017-11-23 MED ORDER — MONTELUKAST SODIUM 10 MG PO TABS
10.0000 mg | ORAL_TABLET | Freq: Every day | ORAL | Status: DC
  Administered 2017-11-23 – 2017-11-26 (×4): 10 mg via ORAL
  Filled 2017-11-23 (×4): qty 1

## 2017-11-23 MED ORDER — IOHEXOL 350 MG/ML SOLN
INTRAVENOUS | Status: DC | PRN
  Administered 2017-11-23: 95 mL via INTRAVENOUS

## 2017-11-23 MED ORDER — ATORVASTATIN CALCIUM 80 MG PO TABS
80.0000 mg | ORAL_TABLET | Freq: Every day | ORAL | Status: DC
  Administered 2017-11-23 – 2017-11-24 (×2): 80 mg via ORAL
  Filled 2017-11-23 (×2): qty 1

## 2017-11-23 MED ORDER — VERAPAMIL HCL 2.5 MG/ML IV SOLN
INTRAVENOUS | Status: DC | PRN
  Administered 2017-11-23: 17:00:00 via INTRA_ARTERIAL

## 2017-11-23 MED ORDER — HYDRALAZINE HCL 20 MG/ML IJ SOLN: 5.0000 mg | INTRAMUSCULAR | Status: AC | PRN

## 2017-11-23 MED ORDER — MORPHINE SULFATE (PF) 2 MG/ML IV SOLN
INTRAVENOUS | Status: AC
  Administered 2017-11-23: 2 mg via INTRAVENOUS
  Filled 2017-11-23: qty 1

## 2017-11-23 MED ORDER — ASPIRIN EC 81 MG PO TBEC
81.0000 mg | DELAYED_RELEASE_TABLET | Freq: Every day | ORAL | Status: DC
  Filled 2017-11-23: qty 1

## 2017-11-23 MED ORDER — ATROPINE SULFATE 1 MG/10ML IJ SOSY
PREFILLED_SYRINGE | INTRAMUSCULAR | Status: DC | PRN
  Administered 2017-11-23: 0.5 mg via INTRAVENOUS

## 2017-11-23 MED ORDER — LABETALOL HCL 5 MG/ML IV SOLN: 10.0000 mg | INTRAVENOUS | Status: AC | PRN

## 2017-11-23 MED ORDER — UMECLIDINIUM BROMIDE 62.5 MCG/INH IN AEPB
1.0000 | INHALATION_SPRAY | Freq: Every day | RESPIRATORY_TRACT | Status: DC
  Administered 2017-11-24 – 2017-11-27 (×3): 1 via RESPIRATORY_TRACT
  Filled 2017-11-23 (×2): qty 7

## 2017-11-23 MED ORDER — SODIUM CHLORIDE 0.9 % IV SOLN: 250.0000 mL | INTRAVENOUS | Status: DC | PRN

## 2017-11-23 MED ORDER — NITROGLYCERIN 1 MG/10 ML FOR IR/CATH LAB
INTRA_ARTERIAL | Status: AC
  Filled 2017-11-23: qty 10

## 2017-11-23 MED ORDER — ADENOSINE 6 MG/2ML IV SOLN
INTRAVENOUS | Status: AC
  Filled 2017-11-23: qty 2

## 2017-11-23 MED ORDER — HEPARIN SODIUM (PORCINE) 5000 UNIT/ML IJ SOLN
4000.0000 [IU] | Freq: Once | INTRAMUSCULAR | Status: AC
  Administered 2017-11-23: 4000 [IU] via INTRAVENOUS

## 2017-11-23 MED ORDER — ALBUTEROL SULFATE (2.5 MG/3ML) 0.083% IN NEBU: 2.5000 mg | INHALATION_SOLUTION | Freq: Four times a day (QID) | RESPIRATORY_TRACT | Status: DC | PRN

## 2017-11-23 MED ORDER — FLUTICASONE FUROATE-VILANTEROL 100-25 MCG/INH IN AEPB
1.0000 | INHALATION_SPRAY | Freq: Every day | RESPIRATORY_TRACT | Status: DC
  Administered 2017-11-24 – 2017-11-27 (×3): 1 via RESPIRATORY_TRACT
  Filled 2017-11-23 (×2): qty 28

## 2017-11-23 MED ORDER — LIDOCAINE HCL (PF) 1 % IJ SOLN
INTRAMUSCULAR | Status: AC
  Filled 2017-11-23: qty 30

## 2017-11-23 MED ORDER — SODIUM CHLORIDE 0.9% FLUSH: 3.0000 mL | INTRAVENOUS | Status: DC | PRN

## 2017-11-23 MED ORDER — HEPARIN SODIUM (PORCINE) 1000 UNIT/ML IJ SOLN
INTRAMUSCULAR | Status: AC
  Filled 2017-11-23: qty 1

## 2017-11-23 MED ORDER — TIROFIBAN HCL IV 12.5 MG/250 ML
INTRAVENOUS | Status: AC
  Filled 2017-11-23: qty 250

## 2017-11-23 MED ORDER — ALBUTEROL SULFATE 4 MG PO TABS: 4.0000 mg | ORAL_TABLET | Freq: Two times a day (BID) | ORAL | Status: DC

## 2017-11-23 MED ORDER — PANTOPRAZOLE SODIUM 40 MG PO TBEC
40.0000 mg | DELAYED_RELEASE_TABLET | ORAL | Status: DC
  Administered 2017-11-24 – 2017-11-27 (×4): 40 mg via ORAL
  Filled 2017-11-23 (×4): qty 1

## 2017-11-23 MED ORDER — SODIUM CHLORIDE 0.9% FLUSH: 3.0000 mL | Freq: Two times a day (BID) | INTRAVENOUS | Status: DC

## 2017-11-23 MED ORDER — ALBUTEROL SULFATE HFA 108 (90 BASE) MCG/ACT IN AERS: 2.0000 | INHALATION_SPRAY | Freq: Four times a day (QID) | RESPIRATORY_TRACT | Status: DC | PRN

## 2017-11-23 MED ORDER — TICAGRELOR 90 MG PO TABS
ORAL_TABLET | ORAL | Status: AC
  Filled 2017-11-23: qty 2

## 2017-11-23 MED ORDER — MIDAZOLAM HCL 2 MG/2ML IJ SOLN
INTRAMUSCULAR | Status: DC | PRN
  Administered 2017-11-23: 1 mg via INTRAVENOUS

## 2017-11-23 MED ORDER — LIDOCAINE HCL (PF) 1 % IJ SOLN
INTRAMUSCULAR | Status: DC | PRN
  Administered 2017-11-23: 2 mL

## 2017-11-23 MED ORDER — HEPARIN SODIUM (PORCINE) 1000 UNIT/ML IJ SOLN
INTRAMUSCULAR | Status: DC | PRN
  Administered 2017-11-23: 7000 [IU] via INTRAVENOUS

## 2017-11-23 MED ORDER — FENTANYL CITRATE (PF) 100 MCG/2ML IJ SOLN
INTRAMUSCULAR | Status: AC
  Filled 2017-11-23: qty 2

## 2017-11-23 MED ORDER — ASPIRIN 81 MG PO CHEW
81.0000 mg | CHEWABLE_TABLET | Freq: Every day | ORAL | Status: DC
  Administered 2017-11-24 – 2017-11-27 (×4): 81 mg via ORAL
  Filled 2017-11-23 (×4): qty 1

## 2017-11-23 MED ORDER — MIDAZOLAM HCL 2 MG/2ML IJ SOLN
INTRAMUSCULAR | Status: AC
  Filled 2017-11-23: qty 2

## 2017-11-23 MED ORDER — HEPARIN (PORCINE) IN NACL 1000-0.9 UT/500ML-% IV SOLN
INTRAVENOUS | Status: AC
  Filled 2017-11-23: qty 1500

## 2017-11-23 MED ORDER — TICAGRELOR 90 MG PO TABS
90.0000 mg | ORAL_TABLET | Freq: Two times a day (BID) | ORAL | Status: DC
  Administered 2017-11-24 – 2017-11-27 (×7): 90 mg via ORAL
  Filled 2017-11-23 (×7): qty 1

## 2017-11-23 MED ORDER — SODIUM CHLORIDE 0.9 % IV SOLN: INTRAVENOUS | Status: DC

## 2017-11-23 MED ORDER — SODIUM CHLORIDE 0.9 % IV SOLN
INTRAVENOUS | Status: DC
  Administered 2017-11-23: 16:00:00 via INTRAVENOUS

## 2017-11-23 MED ORDER — SODIUM CHLORIDE 0.9 % IV SOLN
INTRAVENOUS | Status: AC
  Administered 2017-11-23: 18:00:00 via INTRAVENOUS

## 2017-11-23 MED ORDER — ONDANSETRON HCL 4 MG/2ML IJ SOLN
4.0000 mg | Freq: Four times a day (QID) | INTRAMUSCULAR | Status: DC | PRN
Start: 2017-11-23 — End: 2017-11-27
  Administered 2017-11-25: 4 mg via INTRAVENOUS
  Filled 2017-11-23: qty 2

## 2017-11-23 MED ORDER — TIROFIBAN HCL IV 12.5 MG/250 ML
INTRAVENOUS | Status: AC | PRN
  Administered 2017-11-23: 0.15 ug/kg/min via INTRAVENOUS

## 2017-11-23 MED ORDER — FLUTICASONE-UMECLIDIN-VILANT 100-62.5-25 MCG/INH IN AEPB: 1.0000 | INHALATION_SPRAY | Freq: Every day | RESPIRATORY_TRACT | Status: DC

## 2017-11-23 MED ORDER — ACETAMINOPHEN 325 MG PO TABS: 650.0000 mg | ORAL_TABLET | ORAL | Status: DC | PRN

## 2017-11-23 MED ORDER — ONDANSETRON HCL 4 MG/2ML IJ SOLN
INTRAMUSCULAR | Status: AC
  Administered 2017-11-23: 4 mg
  Filled 2017-11-23: qty 2

## 2017-11-23 MED ORDER — ALBUTEROL SULFATE 4 MG PO TABS: 4.0000 mg | ORAL_TABLET | Freq: Three times a day (TID) | ORAL | Status: DC

## 2017-11-23 MED ORDER — TAMSULOSIN HCL 0.4 MG PO CAPS
0.4000 mg | ORAL_CAPSULE | Freq: Every day | ORAL | Status: DC
  Administered 2017-11-23 – 2017-11-27 (×5): 0.4 mg via ORAL
  Filled 2017-11-23 (×5): qty 1

## 2017-11-23 MED ORDER — ADENOSINE (DIAGNOSTIC) FOR INTRACORONARY USE
INTRAVENOUS | Status: DC | PRN
  Administered 2017-11-23 (×2): 30 ug via INTRACORONARY

## 2017-11-23 MED ORDER — LORAZEPAM 0.5 MG PO TABS
0.5000 mg | ORAL_TABLET | Freq: Four times a day (QID) | ORAL | Status: DC | PRN
  Administered 2017-11-23 – 2017-11-24 (×2): 0.5 mg via ORAL
  Filled 2017-11-23 (×3): qty 1

## 2017-11-23 MED ORDER — SODIUM CHLORIDE 0.9% FLUSH
3.0000 mL | Freq: Two times a day (BID) | INTRAVENOUS | Status: DC
  Administered 2017-11-24 – 2017-11-27 (×7): 3 mL via INTRAVENOUS

## 2017-11-23 MED ORDER — HEPARIN (PORCINE) IN NACL 1000-0.9 UT/500ML-% IV SOLN
INTRAVENOUS | Status: DC | PRN
  Administered 2017-11-23 (×3): 500 mL

## 2017-11-23 MED ORDER — TIROFIBAN (AGGRASTAT) BOLUS VIA INFUSION
INTRAVENOUS | Status: DC | PRN
  Administered 2017-11-23: 1972.5 ug via INTRAVENOUS

## 2017-11-23 MED ORDER — FENTANYL CITRATE (PF) 100 MCG/2ML IJ SOLN
INTRAMUSCULAR | Status: DC | PRN
  Administered 2017-11-23: 25 ug via INTRAVENOUS

## 2017-11-23 SURGICAL SUPPLY — 16 items
BALLN SAPPHIRE ~~LOC~~ 3.5X18 (BALLOONS) ×2 IMPLANT
CATH 5FR JL3.5 JR4 ANG PIG MP (CATHETERS) ×2 IMPLANT
CATH EXTRAC PRONTO 5.5F 138CM (CATHETERS) ×2 IMPLANT
CATH LAUNCHER 6FR JR4 (CATHETERS) ×2 IMPLANT
DEVICE RAD COMP TR BAND LRG (VASCULAR PRODUCTS) ×2 IMPLANT
GLIDESHEATH SLEND SS 6F .021 (SHEATH) ×2 IMPLANT
GUIDEWIRE INQWIRE 1.5J.035X260 (WIRE) ×1 IMPLANT
INQWIRE 1.5J .035X260CM (WIRE) ×2
KIT ENCORE 26 ADVANTAGE (KITS) ×2 IMPLANT
KIT HEART LEFT (KITS) ×2 IMPLANT
KIT HEMO VALVE WATCHDOG (MISCELLANEOUS) ×2 IMPLANT
PACK CARDIAC CATHETERIZATION (CUSTOM PROCEDURE TRAY) ×2 IMPLANT
STENT SYNERGY DES 3X28 (Permanent Stent) ×2 IMPLANT
TRANSDUCER W/STOPCOCK (MISCELLANEOUS) ×2 IMPLANT
TUBING CIL FLEX 10 FLL-RA (TUBING) ×2 IMPLANT
WIRE ASAHI PROWATER 180CM (WIRE) ×2 IMPLANT

## 2017-11-23 NOTE — H&P (Addendum)
PCP:  Sinda Du, MD  PCP-Cardiology: No primary care provider on file.     Reason for Admission: STEMI   HPI:    67 yo with history of COPD and lung cancer treated with radiation earlier this year presented to the ER today with chest pain.  The chest pain has been going on for about 2 hours.  It improved with morphine but did not completely resolved.  SBP 80s initially so he was not given NTG.  BP improved with IV fluids, currently SBP 120s.  He has no prior history of cardiac disease.  He smokes close to 2 ppd.  He also drinks about 6 beers/night. He is not short of breath currently.   Initial ECG showed ST depression/TWI.  Repeat ECG showed acute inferoposterior MI.  Patient has had ASA and was given heparin.   Review of Systems: All systems reviewed and negative except as per HPI.   Home Medications Prior to Admission medications   Medication Sig Start Date End Date Taking? Authorizing Provider  albuterol (PROVENTIL) 4 MG tablet Take 4 mg by mouth 3 (three) times daily. 10/15/17  Yes [provider]  Fluticasone-Umeclidin-Vilant (TRELEGY ELLIPTA) 100-62.5-25 MCG/INH AEPB Inhale 1 puff into the lungs daily. 08/07/17  Yes Tanda Rockers, MD  tamsulosin (FLOMAX) 0.4 MG CAPS capsule Take 0.4 mg by mouth daily. 11/05/17  Yes [provider]  albuterol (PROAIR HFA) 108 (90 BASE) MCG/ACT inhaler Inhale 2 puffs into the lungs every 6 (six) hours as needed for wheezing.    [provider]  albuterol (PROVENTIL) 4 MG tablet Take 1 tablet by mouth 2 (two) times daily. 10/09/16   [provider]  aspirin EC 81 MG tablet Take 81 mg by mouth daily.    [provider]  HYDROMET 5-1.5 MG/5ML syrup TK 5 MLS PO QID 04/17/17   [provider]  LORazepam (ATIVAN) 0.5 MG tablet Take 1 tablet (0.5 mg total) by mouth every 6 (six) hours as needed for anxiety. Patient not taking: Reported on 09/12/2017 04/21/17   Ardath Sax, MD  montelukast  (SINGULAIR) 10 MG tablet Take 10 mg by mouth at bedtime.    [provider]  Multiple Vitamins-Minerals (MULTIVITAMIN WITH MINERALS) tablet Take 1 tablet by mouth daily.    [provider]  pantoprazole (PROTONIX) 40 MG tablet Take 1 tablet by mouth every morning. 10/08/16   [provider]    Past Medical History: Past Medical History:  Diagnosis Date  . COPD (chronic obstructive pulmonary disease) ([place])    for 10 years or more   . History of radiation therapy 06/03/17- 06/09/17   Left Lung treated to 54 Gy with 3 fx of 18 Gy. SBRT  . Lung cancer ([place])   . Skin cancer of face 07/2015   skin cancer removed from face by Dr. Tarri Glenn in [place]    Past Surgical History: Past Surgical History:  Procedure Laterality Date  . COLONOSCOPY  2004   Dr. Truett Perna  . COLONOSCOPY N/A 08/27/2012   Procedure: COLONOSCOPY;  Surgeon: Rogene Houston, MD;  Location: [facility];  Service: Endoscopy;  Laterality: N/A;  1200    Family History:  Family History  Problem Relation Age of Onset  . Breast cancer Sister   . Colon cancer Neg Hx     Social History: Social History   Socioeconomic History  . Marital status: Divorced    Spouse name: Not on file  . Number of  children: Not on file  . Years of education: Not on file  . Highest education level: Not on file  Occupational History  . Not on file  Social Needs  . Financial resource strain: Not on file  . Food insecurity:    Worry: Not on file    Inability: Not on file  . Transportation needs:    Medical: Not on file    Non-medical: Not on file  Tobacco Use  . Smoking status: Current Every Day Smoker    Packs/day: 1.00    Years: 50.00    Pack years: 50.00    Types: Cigars  . Smokeless tobacco: Never Used  . Tobacco comment: 1 pack of cigars a day, he is trying to quit, he is wearing a patch today.   Substance and Sexual Activity  . Alcohol use: Yes    Alcohol/week: 24.0 standard drinks    Types: 24 Cans  of beer per week  . Drug use: No  . Sexual activity: Not on file  Lifestyle  . Physical activity:    Days per week: Not on file    Minutes per session: Not on file  . Stress: Not on file  Relationships  . Social connections:    Talks on phone: Not on file    Gets together: Not on file    Attends religious service: Not on file    Active member of club or organization: Not on file    Attends meetings of clubs or organizations: Not on file    Relationship status: Not on file  Other Topics Concern  . Not on file  Social History Narrative  . Not on file    Allergies:  Allergies  Allergen Reactions  . Bee Venom Hives    Objective:    Vital Signs:   Temp:  [97.4 F (36.3 C)] 97.4 F (36.3 C) (08/11 1546) Pulse Rate:  [70-74] 70 (08/11 1615) Resp:  [16-19] 16 (08/11 1615) BP: (124-147)/(74-82) 124/74 (08/11 1615) SpO2:  [97 %-99 %] 97 % (08/11 1615) Weight:  [78.9 kg] 78.9 kg (08/11 1546)   Filed Weights   11/23/17 1546  Weight: 78.9 kg     Physical Exam     General:  Well appearing. No respiratory difficulty HEENT: Normal Neck: Supple. no JVD. Carotids 2+ bilat; no bruits. No lymphadenopathy or thyromegaly appreciated. Cor: PMI nondisplaced. Regular rate & rhythm. No rubs, gallops or murmurs. Lungs: Mildly distant BS.  Abdomen: Soft, nontender, nondistended. No hepatosplenomegaly. No bruits or masses. Good bowel sounds. Extremities: No cyanosis, clubbing, rash, edema Neuro: Alert & oriented x 3, cranial nerves grossly intact. moves all 4 extremities w/o difficulty. Affect pleasant.   Telemetry   NSR in 60s (personally reviewed)  EKG   NSR, acute inferoposterior MI  Labs     Basic Metabolic Panel: No results for input(s): NA, K, CL, CO2, GLUCOSE, BUN, CREATININE, CALCIUM, MG, PHOS in the last 168 hours.  Liver Function Tests: No results for input(s): AST, ALT, ALKPHOS, BILITOT, PROT, ALBUMIN in the last 168 hours. No results for input(s): LIPASE,  AMYLASE in the last 168 hours. No results for input(s): AMMONIA in the last 168 hours.  CBC: Recent Labs  Lab 11/23/17 1532  WBC 11.0*  NEUTROABS 7.0  HGB 17.0  HCT 48.7  MCV 105.9*  PLT 222    Cardiac Enzymes: No results for input(s): CKTOTAL, CKMB, CKMBINDEX, TROPONINI in the last 168 hours.  BNP: BNP (last 3 results) No results for input(s): BNP  in the last 8760 hours.  ProBNP (last 3 results) No results for input(s): PROBNP in the last 8760 hours.   CBG: No results for input(s): GLUCAP in the last 168 hours.  Coagulation Studies: No results for input(s): LABPROT, INR in the last 72 hours.  Imaging: Dg Chest Port 1 View  Result Date: 11/23/2017 CLINICAL DATA:  Left chest pain EXAM: PORTABLE CHEST 1 VIEW COMPARISON:  August 07, 2017 FINDINGS: The heart size and mediastinal contours are within normal limits. Both lungs are clear. The lungs are hyperinflated. The visualized skeletal structures are unremarkable. IMPRESSION: No active cardiopulmonary disease.  Hyperinflated lungs. Electronically Signed   By: Abelardo Diesel M.D.   On: 11/23/2017 16:11      Patient Profile   67 yo with history of COPD and lung cancer treated with radiation earlier this year presented to the ER today with chest pain, found to have inferoposterior MI.   Assessment/Plan   1. CAD: Acute inferoposterior MI.  Initially hypotensive, improved with IV fluid.  Chest pain ongoing, has had morphine but no NTG. Not bradycardic.  - Transfer to cath lab for emergent PCI.  - He has had ASA and heparin bolus, will need high dose statin.  - Would avoid NTG given possible RV involvement of infarction with low BP initially.  Continue IVF infusion.   2. Active smoking: Strongly encouraged him to quit.  3. H/o COPD: Will need to continue home inhaler regimen.  4. Lung cancer: Left lung, treated with radiation in 2/19.  5. ETOH abuse: Admits to around a 6-pack/night.  Will need CIWA protocol in hospital.     Loralie Champagne, MD 11/23/2017, 4:34 PM  Advanced Heart Failure Team Pager [phone] (M-F; 7a - 4p)  Please contact [facility] for night-coverage after hours (4p -7a ) and weekends on [email or web address]

## 2017-11-23 NOTE — ED Triage Notes (Signed)
Pt to ER transferred from [facility] as CODE STEMI, patient reports 2 hours ago sudden onset left chest pain. Pt received 3 aspirin and 4000 unit bolus of heparin prior to transfer. Pt rating pain 6/10 on arrival. Bilateral 18 g IV's present. Pt is a/o x4.

## 2017-11-23 NOTE — ED Provider Notes (Signed)
[facility] EMERGENCY DEPARTMENT Provider Note   CSN: [id number] Arrival date & time: 11/23/17  1539     History   Chief Complaint Chief Complaint  Patient presents with  . Code STEMI    HPI Adam Bass is a 67 y.o. male.  Pt presents to the ED today with CP.  The pt initially presented OSH with cp.  Initial EKG there showed MI, so Dr. Claiborne Billings (cardiology) was consulted and accepted pt for transfer.  The pt took 81 mg asa at home and the OSH gave him 162 mg.  OSH also started pt on heparin.  The pt was hypotensive (sbp in the 80s), so he was not started on nitro.  The pt was given IVFs and bp is now improved.  The pt still has cp and feels generally "bad."     Past Medical History:  Diagnosis Date  . COPD (chronic obstructive pulmonary disease) ([place])    for 10 years or more   . History of radiation therapy 06/03/17- 06/09/17   Left Lung treated to 54 Gy with 3 fx of 18 Gy. SBRT  . Lung cancer ([place])   . Skin cancer of face 07/2015   skin cancer removed from face by Dr. Tarri Glenn in [place]    Patient Active Problem List   Diagnosis Date Noted  . 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 Primary cancer of left lower lobe of lung ([place]) 04/30/2017  . COPD GOLD III/ still smoking cigars  07/21/2012    Past Surgical History:  Procedure Laterality Date  . COLONOSCOPY  2004   Dr. Truett Perna  . COLONOSCOPY N/A 08/27/2012   Procedure: COLONOSCOPY;  Surgeon: Rogene Houston, MD;  Location[facility];  Service: Endoscopy;  Laterality: N/A;  1200        Home Medications    Prior to Admission medications   Medication Sig Start Date End Date Taking? Authorizing Provider  albuterol (PROVENTIL) 4 MG tablet Take 4 mg by mouth 3 (three) times daily. 10/15/17  Yes [provider]  Fluticasone-Umeclidin-Vilant (TRELEGY ELLIPTA) 100-62.5-25 MCG/INH AEPB Inhale 1 puff into the lungs daily. 08/07/17  Yes Tanda Rockers, MD  tamsulosin (FLOMAX) 0.4 MG CAPS capsule Take  0.4 mg by mouth daily. 11/05/17  Yes [provider]  albuterol (PROAIR HFA) 108 (90 BASE) MCG/ACT inhaler Inhale 2 puffs into the lungs every 6 (six) hours as needed for wheezing.    [provider]  albuterol (PROVENTIL) 4 MG tablet Take 1 tablet by mouth 2 (two) times daily. 10/09/16   [provider]  aspirin EC 81 MG tablet Take 81 mg by mouth daily.    [provider]  HYDROMET 5-1.5 MG/5ML syrup TK 5 MLS PO QID 04/17/17   [provider]  LORazepam (ATIVAN) 0.5 MG tablet Take 1 tablet (0.5 mg total) by mouth every 6 (six) hours as needed for anxiety. Patient not taking: Reported on 09/12/2017 04/21/17   Ardath Sax, MD  montelukast (SINGULAIR) 10 MG tablet Take 10 mg by mouth at bedtime.    [provider]  Multiple Vitamins-Minerals (MULTIVITAMIN WITH MINERALS) tablet Take 1 tablet by mouth daily.    [provider]  pantoprazole (PROTONIX) 40 MG tablet Take 1 tablet by mouth every morning. 10/08/16   [provider]    Family History Family History  Problem Relation Age of Onset  . Breast cancer Sister   . Colon cancer Neg Hx     Social  History Social History   Tobacco Use  . Smoking status: Current Every Day Smoker    Packs/day: 1.00    Years: 50.00    Pack years: 50.00    Types: Cigars  . Smokeless tobacco: Never Used  . Tobacco comment: 1 pack of cigars a day, he is trying to quit, he is wearing a patch today.   Substance Use Topics  . Alcohol use: Yes    Alcohol/week: 24.0 standard drinks    Types: 24 Cans of beer per week  . Drug use: No     Allergies   Bee venom   Review of Systems Review of Systems  Cardiovascular: Positive for chest pain.  All other systems reviewed and are negative.    Physical Exam Updated Vital Signs BP 124/74   Pulse 70   Temp (!) 97.4 F (36.3 C) (Oral)   Resp 16   Wt 78.9 kg   SpO2 98%   BMI 25.70 kg/m   Physical Exam  Constitutional: He is  oriented to person, place, and time. He appears well-developed and well-nourished.  HENT:  Head: Normocephalic and atraumatic.  Right Ear: External ear normal.  Left Ear: External ear normal.  Nose: Nose normal.  Mouth/Throat: Oropharynx is clear and moist.  Eyes: Pupils are equal, round, and reactive to light. Conjunctivae and EOM are normal.  Neck: Normal range of motion.  Cardiovascular: Normal rate, regular rhythm, normal heart sounds and intact distal pulses.  Pulmonary/Chest: Effort normal and breath sounds normal.  Abdominal: Soft. Bowel sounds are normal.  Musculoskeletal: Normal range of motion.  Neurological: He is alert and oriented to person, place, and time.  Skin: Skin is warm. Capillary refill takes less than 2 seconds.  Psychiatric: He has a normal mood and affect. His behavior is normal. Judgment and thought content normal.  Nursing note and vitals reviewed.    ED Treatments / Results  Labs (all labs ordered are listed, but only abnormal results are displayed) Labs Reviewed  CBC WITH DIFFERENTIAL/PLATELET - Abnormal; Notable for the following components:      Result Value   WBC 11.0 (*)    MCV 105.9 (*)    MCH 37.0 (*)    All other components within normal limits  PROTIME-INR  APTT  COMPREHENSIVE METABOLIC PANEL  TROPONIN I  LIPID PANEL  I-STAT TROPONIN, ED    EKG EKG Interpretation  Date/Time:  Sunday November 23 2017 15:49:27 EDT Ventricular Rate:  72 PR Interval:    QRS Duration: 80 QT Interval:  383 QTC Calculation: 420 R Axis:   98 Text Interpretation:  Sinus rhythm Abnormal R-wave progression, late transition Inferior infarct, acute (RCA) Probable RV involvement, suggest recording right precordial leads ** ** ACUTE MI / STEMI ** ** Confirmed by Isla Pence [phone]) on 11/23/2017 4:05:14 PM   Radiology Dg Chest Port 1 View  Result Date: 11/23/2017 CLINICAL DATA:  Left chest pain EXAM: PORTABLE CHEST 1 VIEW COMPARISON:  August 07, 2017  FINDINGS: The heart size and mediastinal contours are within normal limits. Both lungs are clear. The lungs are hyperinflated. The visualized skeletal structures are unremarkable. IMPRESSION: No active cardiopulmonary disease.  Hyperinflated lungs. Electronically Signed   By: Abelardo Diesel M.D.   On: 11/23/2017 16:11    Procedures Procedures (including critical care time)  Medications Ordered in ED Medications  0.9 %  sodium chloride infusion ( Intravenous New Bag/Given 11/23/17 1600)  sodium chloride flush (NS) 0.9 % injection 3 mL (has no administration in  time range)  sodium chloride flush (NS) 0.9 % injection 3 mL (has no administration in time range)  0.9 %  sodium chloride infusion (has no administration in time range)  0.9 %  sodium chloride infusion (has no administration in time range)  morphine 2 MG/ML injection (2 mg Intravenous Given 11/23/17 1549)  ondansetron (ZOFRAN) 4 MG/2ML injection (4 mg  Given 11/23/17 1552)  heparin injection 4,000 Units (4,000 Units Intravenous Given by Other 11/23/17 1603)  morphine 2 MG/ML injection (2 mg Intravenous Given 11/23/17 1603)     Initial Impression / Assessment and Plan / ED Course  I have reviewed the triage vital signs and the nursing notes.  Pertinent labs & imaging results that were available during my care of the patient were reviewed by me and considered in my medical decision making (see chart for details).  CRITICAL CARE Performed by: Isla Pence   Total critical care time: 30 minutes  Critical care time was exclusive of separately billable procedures and treating other patients.  Critical care was necessary to treat or prevent imminent or life-threatening deterioration.  Critical care was time spent personally by me on the following activities: development of treatment plan with patient and/or surrogate as well as nursing, discussions with consultants, evaluation of patient's response to treatment, examination of patient,  obtaining history from patient or surrogate, ordering and performing treatments and interventions, ordering and review of laboratory studies, ordering and review of radiographic studies, pulse oximetry and re-evaluation of patient's condition.  Pt d/w cardiology who will take pt to the cath lab.  Final Clinical Impressions(s) / ED Diagnoses   Final diagnoses:  ST elevation myocardial infarction (STEMI) involving other coronary artery of inferior wall [facility])    ED Discharge Orders    None       Isla Pence, MD 11/23/17 1635

## 2017-11-23 NOTE — Progress Notes (Signed)
36M with COPD, lung CA stage 1 (tx with radiation), ongoing tobacco, alcohol abuse (6 beers 4-5 nights/week) developed L sided CP around 1:30pm w/ diaphoresis, SOB. Dtr drove him to [facility] (5 min away). Initial BP was low and pain had eased so no pain meds were given. He'd taken 1 baby aspirin prior to arrival and got 3 more at [facility] along with 4000 units of heparin.  Initial EKG showed significant TWI/ST depression V2-V5, also TWI I, avL but no inferior ST elevation. Code STEMI called. Dr. Claiborne Billings took the call. Pt was 1 of 2 STEMIs be called to interventionalist at same time - the first one arrived later than expected and this one arrived earlier than expected. Given overlap, second cath team/interventionalist was called in. His EKG on arrival to [facility] did show significant inferior ST elevation as well. Due to ongoing pain got 4mg  of morphine and 4mg  of Zofran. Full note to follow. Kenitha Glendinning PA-C

## 2017-11-24 ENCOUNTER — Encounter (HOSPITAL_COMMUNITY): Payer: Self-pay | Admitting: Interventional Cardiology

## 2017-11-24 ENCOUNTER — Inpatient Hospital Stay (HOSPITAL_COMMUNITY): Payer: BLUE CROSS/BLUE SHIELD

## 2017-11-24 ENCOUNTER — Other Ambulatory Visit: Payer: Self-pay

## 2017-11-24 DIAGNOSIS — I361 Nonrheumatic tricuspid (valve) insufficiency: Secondary | ICD-10-CM

## 2017-11-24 DIAGNOSIS — J449 Chronic obstructive pulmonary disease, unspecified: Secondary | ICD-10-CM

## 2017-11-24 DIAGNOSIS — D7589 Other specified diseases of blood and blood-forming organs: Secondary | ICD-10-CM

## 2017-11-24 DIAGNOSIS — Z720 Tobacco use: Secondary | ICD-10-CM

## 2017-11-24 LAB — BASIC METABOLIC PANEL
Anion gap: 7 (ref 5–15)
BUN: 14 mg/dL (ref 8–23)
CHLORIDE: 105 mmol/L (ref 98–111)
CO2: 25 mmol/L (ref 22–32)
Calcium: 8.4 mg/dL — ABNORMAL LOW (ref 8.9–10.3)
Creatinine, Ser: 0.82 mg/dL (ref 0.61–1.24)
GFR calc Af Amer: >60 mL/min (ref >60)
GLUCOSE: 103 mg/dL — AB (ref 70–99)
Potassium: 4.3 mmol/L (ref 3.5–5.1)
Sodium: 137 mmol/L (ref 135–145)

## 2017-11-24 LAB — CBC
HEMATOCRIT: 43 % (ref 39.0–52.0)
Hemoglobin: 14.7 g/dL (ref 13.0–17.0)
MCH: 36.5 pg — AB (ref 26.0–34.0)
MCHC: 34.2 g/dL (ref 30.0–36.0)
MCV: 106.7 fL — AB (ref 78.0–100.0)
Platelets: 208 10*3/uL (ref 150–400)
RBC: 4.03 MIL/uL — ABNORMAL LOW (ref 4.22–5.81)
RDW: 12 % (ref 11.5–15.5)
WBC: 11.2 10*3/uL — ABNORMAL HIGH (ref 4.0–10.5)

## 2017-11-24 LAB — ECHOCARDIOGRAM COMPLETE
Height: 69 in
Weight: 2303.37 oz

## 2017-11-24 LAB — LIPID PANEL
CHOLESTEROL: 140 mg/dL (ref 0–200)
HDL: 33 mg/dL — AB (ref >40)
LDL CALC: 84 mg/dL (ref 0–99)
TRIGLYCERIDES: 116 mg/dL (ref <150)
Total CHOL/HDL Ratio: 4.2 RATIO
VLDL: 23 mg/dL (ref 0–40)

## 2017-11-24 LAB — TROPONIN I: Troponin I: >65 ng/mL (ref <0.03)

## 2017-11-24 MED ORDER — ORAL CARE MOUTH RINSE
15.0000 mL | Freq: Two times a day (BID) | OROMUCOSAL | Status: DC
Start: 2017-11-24 — End: 2017-11-27
  Administered 2017-11-24 – 2017-11-27 (×5): 15 mL via OROMUCOSAL

## 2017-11-24 MED ORDER — ENOXAPARIN SODIUM 40 MG/0.4ML ~~LOC~~ SOLN
40.0000 mg | SUBCUTANEOUS | Status: DC
  Administered 2017-11-24 – 2017-11-27 (×4): 40 mg via SUBCUTANEOUS
  Filled 2017-11-24 (×4): qty 0.4

## 2017-11-24 MED ORDER — SALINE SPRAY 0.65 % NA SOLN
1.0000 | NASAL | Status: DC | PRN
  Filled 2017-11-24: qty 44

## 2017-11-24 MED ORDER — PNEUMOCOCCAL VAC POLYVALENT 25 MCG/0.5ML IJ INJ
0.5000 mL | INJECTION | INTRAMUSCULAR | Status: AC
  Administered 2017-11-25: 0.5 mL via INTRAMUSCULAR
  Filled 2017-11-24: qty 0.5

## 2017-11-24 MED ORDER — SODIUM CHLORIDE 0.9 % IV SOLN
INTRAVENOUS | Status: DC
  Administered 2017-11-24 (×2): via INTRAVENOUS

## 2017-11-24 MED ORDER — ENSURE ENLIVE PO LIQD
237.0000 mL | Freq: Two times a day (BID) | ORAL | Status: DC
  Administered 2017-11-24 – 2017-11-27 (×6): 237 mL via ORAL

## 2017-11-24 NOTE — Progress Notes (Signed)
Echocardiogram 2D Echocardiogram has been performed.  11/24/2017 11:01 AM Maudry Mayhew, MHA, RVT, RDCS, RDMS

## 2017-11-24 NOTE — Progress Notes (Addendum)
Progress Note  Patient Name: Adam Bass Date of Encounter: 11/24/2017  Primary Cardiologist: new  Subjective   No recurrent chest pain  Inpatient Medications    Scheduled Meds: . aspirin  81 mg Oral Daily  . aspirin EC  81 mg Oral Daily  . atorvastatin  80 mg Oral q1800  . fluticasone furoate-vilanterol  1 puff Inhalation Daily  . mouth rinse  15 mL Mouth Rinse BID  . montelukast  10 mg Oral QHS  . pantoprazole  40 mg Oral BH-q7a  . sodium chloride flush  3 mL Intravenous Q12H  . tamsulosin  0.4 mg Oral Daily  . ticagrelor  90 mg Oral BID  . umeclidinium bromide  1 puff Inhalation Daily   Continuous Infusions: . sodium chloride 10 mL/hr at 11/23/17 1600  . sodium chloride     PRN Meds: sodium chloride, acetaminophen, albuterol, LORazepam, ondansetron (ZOFRAN) IV, sodium chloride flush   Vital Signs    Vitals:   11/24/17 0600 11/24/17 0630 11/24/17 0700 11/24/17 0800  BP: 96/66 (!) 79/52 (!) 88/60 (!) 98/59  Pulse: 69 69  75  Resp: 15 18 19 15   Temp:    98.4 F (36.9 C)  TempSrc:    Oral  SpO2: 96% 95% 96% 94%  Weight:      Height:        Intake/Output Summary (Last 24 hours) at 11/24/2017 0913 Last data filed at 11/24/2017 0800 Gross per 24 hour  Intake 1224.46 ml  Output 400 ml  Net 824.46 ml    I/O since admission:  +824  [facility] Weights   11/23/17 1546 11/24/17 0145  Weight: 78.9 kg 65.3 kg    Telemetry    Sinus 73 - Personally Reviewed  ECG    11/24/2017: ECG (independently read by me): NSR at 71; resolution of STE and T wave inversion suggestive of potential myocardial salvage   11/23/2017 ECG (independently read by me): NSR at 72 with acute 2 mm STE 2,3,aVF, and significant T wave inversion 1,aVL, V1-6   Physical Exam   BP (!) 98/59 (BP Location: Left Arm)   Pulse 75   Temp 98.4 F (36.9 C) (Oral)   Resp 15   Ht 5\' 9"  (1.753 m)   Wt 65.3 kg   SpO2 94%   BMI 21.26 kg/m  General: Alert, oriented, no distress.  Skin: normal  turgor, no rashes, warm and dry HEENT: Normocephalic, atraumatic. Pupils equal round and reactive to light; sclera anicteric; extraocular muscles intact; Nose without nasal septal hypertrophy Mouth/Parynx benign; Mallinpatti scale 3 Neck: No JVD, no carotid bruits; normal carotid upstroke Lungs: clear to ausculatation and percussion; no wheezing or rales Chest wall: without tenderness to palpitation Heart: PMI not displaced, RRR, s1 s2 normal, 1/6 systolic murmur, no diastolic murmur, no rubs, gallops, thrills, or heaves Abdomen: soft, nontender; no hepatosplenomehaly, BS+; abdominal aorta nontender and not dilated by palpation. Back: no CVA tenderness Pulses 2+ Musculoskeletal: full range of motion, normal strength, no joint deformities Extremities: no clubbing cyanosis or edema, Homan's sign negative  Neurologic: grossly nonfocal; Cranial nerves grossly wnl Psychologic: Normal mood and affect   Labs    Chemistry Recent Labs  Lab 11/23/17 1532 11/23/17 1652 11/24/17 0609  NA 137 132* 137  K 4.2 3.7 4.3  CL 106 95* 105  CO2 16*  --  25  GLUCOSE 198* 127* 103*  BUN 11 12 14   CREATININE 0.71 0.50* 0.82  CALCIUM 9.0  --  8.4*  PROT 6.8  --   --   ALBUMIN 3.5  --   --   AST 106*  --   --   ALT QUANTITY NOT SUFFICIENT, UNABLE TO PERFORM TEST  --   --   ALKPHOS 112  --   --   BILITOT QUANTITY NOT SUFFICIENT, UNABLE TO PERFORM TEST  --   --   GFRNONAA >60  --  >60  GFRAA >60  --  >60  ANIONGAP 15  --  7     Hematology Recent Labs  Lab 11/23/17 1532 11/23/17 1652 11/23/17 1802 11/24/17 0609  WBC 11.0*  --  11.1* 11.2*  RBC 4.60  --  4.57 4.03*  HGB 17.0 15.6 17.4* 14.7  HCT 48.7 46.0 50.1 43.0  MCV 105.9*  --  109.6* 106.7*  MCH 37.0*  --  38.1* 36.5*  MCHC 34.9  --  34.7 34.2  RDW 11.8  --  12.2 12.0  PLT 222  --  230 208    Cardiac Enzymes Recent Labs  Lab 11/23/17 1532 11/23/17 1802 11/24/17 0024 11/24/17 0609  TROPONINI 0.02 2.67* >65.00* >65.00*      Recent Labs  Lab 11/23/17 1543  TROPIPOC 0.02     BNPNo results for input(s): BNP, PROBNP in the last 168 hours.   DDimer No results for input(s): DDIMER in the last 168 hours.   Lipid Panel     Component Value Date/Time   CHOL 140 11/24/2017 0024   TRIG 116 11/24/2017 0024   HDL 33 (L) 11/24/2017 0024   CHOLHDL 4.2 11/24/2017 0024   VLDL 23 11/24/2017 0024   LDLCALC 84 11/24/2017 0024    Radiology    Dg Chest Port 1 View  Result Date: 11/23/2017 CLINICAL DATA:  Left chest pain EXAM: PORTABLE CHEST 1 VIEW COMPARISON:  August 07, 2017 FINDINGS: The heart size and mediastinal contours are within normal limits. Both lungs are clear. The lungs are hyperinflated. The visualized skeletal structures are unremarkable. IMPRESSION: No active cardiopulmonary disease.  Hyperinflated lungs. Electronically Signed   By: Abelardo Diesel M.D.   On: 11/23/2017 16:11    Cardiac Studies   Prox LAD lesion is 25% stenosed.  1st Mrg lesion is 25% stenosed.  Post Atrio lesion is 70% stenosed.  Dist RCA lesion is 25% stenosed.  Mid RCA lesion is 100% stenosed.  After percutaneous thrombectomy, a drug-eluting stent was successfully placed using a STENT SYNERGY DES 3X28.  Post intervention, there is a 0% residual stenosis.  The left ventricular systolic function is normal.  LV end diastolic pressure is moderately elevated. LVEDP 27 mm Hg.  The left ventricular ejection fraction is 45-50% by visual estimate.  There is no aortic valve stenosis.   Recommend uninterrupted dual antiplatelet therapy with Aspirin 81mg  daily and Ticagrelor 90mg  twice daily for a minimum of 12 months (ACS - Class I recommendation).   If antiplatelet therapy needed to be stopped earlier due to need for other invasive procedure, this could be discussed since a Synergy stent was placed.   Watch in ICU.  Continue tirofiban for a few more hours.  No beta blocker due to some bradycardia.  He will need statin as well.    BP responded to half amp of atropine.  IV fluid limited by increased LVEDP.        Patient Profile     67 y.o. male who presented from [facility] with inferior STEMI taken emergently to cath lab and underwent thrombectomy/DES stent to  mid RCA  Assessment & Plan    1. Day 1 Inferior STEMI; s/p thrombectomy/DES stent to mid RCA. Mild concomitant CAD as above.  No recurrent anginal symptoms.  2. Hypotension: initial ECG suggestive of possible RV involvment.  Will resume IV fuids today. Echo is scheduled for today.  Currently not on any meds to decrease BP.  3. Significant tobacco :  2 PPD.Marland Kitchen Long discussion with pt.  Imperative to quit.  4. ETOH: drinking almost a 6 pack /night.  5. HLD: LDL 84; started on atorvastatin 80 mg post MI.  6. Macrocytosis: MCV 109,  With ETOH  Check B12/folate.   6. Lung Ca; stage 1  8. COPD: on trilogy at home. Now on incruse ellipta in hospital and breo ellipta  Signed, Troy Sine, MD, [facility] 11/24/2017, 9:13 AM

## 2017-11-24 NOTE — Care Management (Signed)
11-24-17  BENEFITS CHECK:  #  3.   S/W DANA @  [facility]  RX # [id number]  BRILINTA   90 MG BID COVER- YES CO-PAY- $ 40.00 TIER- 2 DRUG PRIOR APPROVAL- NO  PREFERRED PHARMACY : YES   [facility]

## 2017-11-24 NOTE — Progress Notes (Signed)
CARDIAC REHAB PHASE I   PRE:  Rate/Rhythm: 71 SR    BP: sitting 92/66    SaO2: 97 2L, 95-97 RA  MODE:  Ambulation: 120 ft   POST:  Rate/Rhythm: 84 SR    BP: sitting 107/62     SaO2: 97 2L  Pt feeling tired. Reluctant to walk, did short distance. He preferred to wear O2 today however SAO2 were adequate on RA at rest. Slow, steady walk, rest x1 after 60 ft at window. Pt does not complain much. To recliner. Began ed with pt and daughter. He understands his Brilinta. Discussed smoking in depth. Pt would benefit from a nicotine patch as he feels like he could smoke a pack right away. Gave pt a fake cigarette which he seemed to enjoy. He will probably not quit smoking although he is thinking about it. Gave resources and tips. Will f/u with more education tomorrow. [place], ACSM 11/24/2017 11:49 AM

## 2017-11-24 NOTE — Progress Notes (Signed)
Troponin >65 lab consistent with previous result

## 2017-11-25 ENCOUNTER — Encounter (HOSPITAL_COMMUNITY): Payer: Self-pay | Admitting: Student

## 2017-11-25 DIAGNOSIS — R945 Abnormal results of liver function studies: Secondary | ICD-10-CM

## 2017-11-25 LAB — CBC WITH DIFFERENTIAL/PLATELET
Abs Immature Granulocytes: 0 10*3/uL (ref 0.0–0.1)
BASOS PCT: 1 %
Basophils Absolute: 0.1 10*3/uL (ref 0.0–0.1)
EOS ABS: 0.3 10*3/uL (ref 0.0–0.7)
EOS PCT: 3 %
HCT: 42.6 % (ref 39.0–52.0)
Hemoglobin: 14.3 g/dL (ref 13.0–17.0)
Immature Granulocytes: 0 %
Lymphocytes Relative: 30 %
Lymphs Abs: 3.3 10*3/uL (ref 0.7–4.0)
MCH: 36.1 pg — ABNORMAL HIGH (ref 26.0–34.0)
MCHC: 33.6 g/dL (ref 30.0–36.0)
MCV: 107.6 fL — ABNORMAL HIGH (ref 78.0–100.0)
Monocytes Absolute: 1.1 10*3/uL — ABNORMAL HIGH (ref 0.1–1.0)
Monocytes Relative: 10 %
Neutro Abs: 6 10*3/uL (ref 1.7–7.7)
Neutrophils Relative %: 56 %
Platelets: 205 10*3/uL (ref 150–400)
RBC: 3.96 MIL/uL — AB (ref 4.22–5.81)
RDW: 11.9 % (ref 11.5–15.5)
WBC: 10.8 10*3/uL — AB (ref 4.0–10.5)

## 2017-11-25 LAB — HEMOGLOBIN A1C
Hgb A1c MFr Bld: 5 % (ref 4.8–5.6)
MEAN PLASMA GLUCOSE: 97 mg/dL

## 2017-11-25 LAB — COMPREHENSIVE METABOLIC PANEL
ALBUMIN: 2.9 g/dL — AB (ref 3.5–5.0)
ALT: 107 U/L — ABNORMAL HIGH (ref 0–44)
ANION GAP: 8 (ref 5–15)
AST: 185 U/L — ABNORMAL HIGH (ref 15–41)
Alkaline Phosphatase: 88 U/L (ref 38–126)
BUN: 11 mg/dL (ref 8–23)
CO2: 25 mmol/L (ref 22–32)
Calcium: 8.3 mg/dL — ABNORMAL LOW (ref 8.9–10.3)
Chloride: 105 mmol/L (ref 98–111)
Creatinine, Ser: 0.71 mg/dL (ref 0.61–1.24)
GFR calc Af Amer: >60 mL/min (ref >60)
GFR calc non Af Amer: >60 mL/min (ref >60)
GLUCOSE: 100 mg/dL — AB (ref 70–99)
POTASSIUM: 3.9 mmol/L (ref 3.5–5.1)
Sodium: 138 mmol/L (ref 135–145)
TOTAL PROTEIN: 5.8 g/dL — AB (ref 6.5–8.1)
Total Bilirubin: 1.4 mg/dL — ABNORMAL HIGH (ref 0.3–1.2)

## 2017-11-25 LAB — FOLATE: Folate: 14.5 ng/mL (ref >5.9)

## 2017-11-25 LAB — VITAMIN B12: Vitamin B-12: 473 pg/mL (ref 180–914)

## 2017-11-25 MED ORDER — BISOPROLOL FUMARATE 5 MG PO TABS
2.5000 mg | ORAL_TABLET | Freq: Every day | ORAL | Status: DC
  Administered 2017-11-25 – 2017-11-27 (×3): 2.5 mg via ORAL
  Filled 2017-11-25 (×3): qty 1

## 2017-11-25 NOTE — Progress Notes (Signed)
CARDIAC REHAB PHASE I   PRE:  Rate/Rhythm: 59 SB    BP: sitting 108/54    SaO2: 97 RA  MODE:  Ambulation: 430 ft   POST:  Rate/Rhythm: 73 SR    BP: sitting 132/69     SaO2: 98 RA  Pt tolerated well, no c/o. Finished ed. Will refer to Adam Bass. Adam Bass, ACSM 11/25/2017 3:31 PM

## 2017-11-25 NOTE — Progress Notes (Signed)
Progress Note  Patient Name: Adam Bass Date of Encounter: 11/25/2017  Primary Cardiologist: new  Subjective   No recurrent chest pain or dyspnea.  Inpatient Medications    Scheduled Meds: . aspirin  81 mg Oral Daily  . atorvastatin  80 mg Oral q1800  . enoxaparin (LOVENOX) injection  40 mg Subcutaneous Q24H  . feeding supplement (ENSURE ENLIVE)  237 mL Oral BID BM  . fluticasone furoate-vilanterol  1 puff Inhalation Daily  . mouth rinse  15 mL Mouth Rinse BID  . montelukast  10 mg Oral QHS  . pantoprazole  40 mg Oral BH-q7a  . pneumococcal 23 valent vaccine  0.5 mL Intramuscular Tomorrow-1000  . sodium chloride flush  3 mL Intravenous Q12H  . tamsulosin  0.4 mg Oral Daily  . ticagrelor  90 mg Oral BID  . umeclidinium bromide  1 puff Inhalation Daily   Continuous Infusions: . sodium chloride    . sodium chloride 75 mL/hr at 11/25/17 0600   PRN Meds: sodium chloride, acetaminophen, albuterol, LORazepam, ondansetron (ZOFRAN) IV, sodium chloride, sodium chloride flush   Vital Signs    Vitals:   11/25/17 0403 11/25/17 0500 11/25/17 0600 11/25/17 0700  BP:  (!) 100/53 (!) 105/57 115/77  Pulse:  66 75 70  Resp:  (!) 21 (!) 22 17  Temp: 98.7 F (37.1 C)     TempSrc: Oral     SpO2:  97% 94% 97%  Weight:      Height:        Intake/Output Summary (Last 24 hours) at 11/25/2017 0811 Last data filed at 11/25/2017 0600 Gross per 24 hour  Intake 1923.27 ml  Output 1900 ml  Net 23.27 ml    I/O since admission:  +847  Filed Weights   11/23/17 1546 11/24/17 0145  Weight: 78.9 kg 65.3 kg    Telemetry    Sinus 68- Personally Reviewed  ECG    11/24/2017: ECG (independently read by me): NSR at 71; resolution of STE and T wave inversion suggestive of potential myocardial salvage   11/23/2017 ECG (independently read by me): NSR at 72 with acute 2 mm STE 2,3,aVF, and significant T wave inversion 1,aVL, V1-6   Physical Exam  General: Alert, oriented, no  distress.  Skin: normal turgor, no rashes, warm and dry HEENT: Normocephalic, atraumatic. Pupils equal round and reactive to light; sclera anicteric; extraocular muscles intact;  Nose without nasal septal hypertrophy Mouth/Parynx benign; Mallinpatti scale 3Neck: No JVD, no carotid bruits; normal carotid upstroke Lungs: diffusely decreased BS; no wheezing Chest wall: without tenderness to palpitation Heart: PMI not displaced, RRR, s1 s2 normal, 1/6 systolic murmur, no diastolic murmur, no rubs, gallops, thrills, or heaves Abdomen: soft, nontender; no hepatosplenomehaly, BS+; abdominal aorta nontender and not dilated by palpation. Back: no CVA tenderness Pulses 2+ R radial site ecchymosis R forearm, non tender Musculoskeletal: full range of motion, normal strength, no joint deformities Extremities: no clubbing cyanosis or edema, Homan's sign negative  Neurologic: grossly nonfocal; Cranial nerves grossly wnl Psychologic: Normal mood and affect     Labs    Chemistry Recent Labs  Lab 11/23/17 1532 11/23/17 1652 11/24/17 0609 11/25/17 0233  NA 137 132* 137 138  K 4.2 3.7 4.3 3.9  CL 106 95* 105 105  CO2 16*  --  25 25  GLUCOSE 198* 127* 103* 100*  BUN 11 12 14 11   CREATININE 0.71 0.50* 0.82 0.71  CALCIUM 9.0  --  8.4* 8.3*  PROT 6.8  --   --  5.8*  ALBUMIN 3.5  --   --  2.9*  AST 106*  --   --  185*  ALT QUANTITY NOT SUFFICIENT, UNABLE TO PERFORM TEST  --   --  107*  ALKPHOS 112  --   --  88  BILITOT QUANTITY NOT SUFFICIENT, UNABLE TO PERFORM TEST  --   --  1.4*  GFRNONAA >60  --  >60 >60  GFRAA >60  --  >60 >60  ANIONGAP 15  --  7 8     Hematology Recent Labs  Lab 11/23/17 1802 11/24/17 0609 11/25/17 0233  WBC 11.1* 11.2* 10.8*  RBC 4.57 4.03* 3.96*  HGB 17.4* 14.7 14.3  HCT 50.1 43.0 42.6  MCV 109.6* 106.7* 107.6*  MCH 38.1* 36.5* 36.1*  MCHC 34.7 34.2 33.6  RDW 12.2 12.0 11.9  PLT 230 208 205    Cardiac Enzymes Recent Labs  Lab 11/23/17 1532  11/23/17 1802 11/24/17 0024 11/24/17 0609  TROPONINI 0.02 2.67* >65.00* >65.00*    Recent Labs  Lab 11/23/17 1543  TROPIPOC 0.02     BNPNo results for input(s): BNP, PROBNP in the last 168 hours.   DDimer No results for input(s): DDIMER in the last 168 hours.   Lipid Panel     Component Value Date/Time   CHOL 140 11/24/2017 0024   TRIG 116 11/24/2017 0024   HDL 33 (L) 11/24/2017 0024   CHOLHDL 4.2 11/24/2017 0024   VLDL 23 11/24/2017 0024   LDLCALC 84 11/24/2017 0024    Radiology    Dg Chest Port 1 View  Result Date: 11/23/2017 CLINICAL DATA:  Left chest pain EXAM: PORTABLE CHEST 1 VIEW COMPARISON:  August 07, 2017 FINDINGS: The heart size and mediastinal contours are within normal limits. Both lungs are clear. The lungs are hyperinflated. The visualized skeletal structures are unremarkable. IMPRESSION: No active cardiopulmonary disease.  Hyperinflated lungs. Electronically Signed   By: Abelardo Diesel M.D.   On: 11/23/2017 16:11    Cardiac Studies   Prox LAD lesion is 25% stenosed.  1st Mrg lesion is 25% stenosed.  Post Atrio lesion is 70% stenosed.  Dist RCA lesion is 25% stenosed.  Mid RCA lesion is 100% stenosed.  After percutaneous thrombectomy, a drug-eluting stent was successfully placed using a STENT SYNERGY DES 3X28.  Post intervention, there is a 0% residual stenosis.  The left ventricular systolic function is normal.  LV end diastolic pressure is moderately elevated. LVEDP 27 mm Hg.  The left ventricular ejection fraction is 45-50% by visual estimate.  There is no aortic valve stenosis.   Recommend uninterrupted dual antiplatelet therapy with Aspirin 81mg  daily and Ticagrelor 90mg  twice daily for a minimum of 12 months (ACS - Class I recommendation).   If antiplatelet therapy needed to be stopped earlier due to need for other invasive procedure, this could be discussed since a Synergy stent was placed.   Watch in ICU.  Continue tirofiban for a  few more hours.  No beta blocker due to some bradycardia.  He will need statin as well.   BP responded to half amp of atropine.  IV fluid limited by increased LVEDP.       ------------------------------------------------------------------- 11/24/17 ECHO Study Conclusions  - Left ventricle: Cannot fully evaluate regional wall motion as   endocardium is not well seen in some views The basal nferior wall   does appear hypokinetic. OVerall LVEF is normal. The cavity size   was normal. Wall thickness was normal. Systolic function was  normal. The estimated ejection fraction was in the range of 55%   to 60%. - Right ventricle: The cavity size was mildly dilated.   Patient Profile     67 y.o. male who presented from [facility] with inferior STEMI taken emergently to cath lab and underwent thrombectomy/DES stent to mid RCA  Assessment & Plan    1. Day 2 Inferior STEMI; s/p thrombectomy/DES stent to mid RCA. Mild concomitant CAD as above.  No recurrent anginal symptoms. Trop > 65. EF 55 - 60% on echo.  2. Hypotension: initial ECG suggestive of possible RV involvment.  Mild RV dilation on echo  Continue NS at 75 cc/hr today;  BP 94.69 earlier, now 103/65.  Will try adding very low dose bisoprolol 2.5 mg today and tomorrow titrate if BP allows. Will hold off on ARB today (would not use ACE-I with his COPD) .  3. Significant tobacco :  2 PPD.Marland Kitchen Long discussion with pt.  Imperative to quit.  4. ETOH: drinking almost a 6 pack /night.  5. HLD: LDL 84; started on atorvastatin 80 mg post MI. LFTs increased  Will hold statin for now.  6. Increased LFTs:  H/o elevated LFTs in 09/2017;  AST 185, ALT 107 today.  7. Macrocytosis: MCV 109;  Folate 14.5; B12 473   8. Lung Ca; stage 1  9. COPD: on trilogy at home. Now on incruse ellipta in hospital and breo ellipta   Will start cardiac rehab.  Transfer to telemetry today.   Signed, Troy Sine, MD, [facility] 11/25/2017, 8:11 AM

## 2017-11-25 NOTE — Progress Notes (Signed)
Visited with Adam Bass regarding Advanced Directive.  He would like his daughter to help him complete it and talk with her about his wishes.  She will be arriving sometime today.  Shared with the patient to please notify the nurse when he has completed the form and we will come back and notarize the form.      11/25/17 1158  Clinical Encounter Type  Visited With Patient  Visit Type Initial;Spiritual support  Spiritual Encounters  Spiritual Needs Literature (Advanced Directive)

## 2017-11-25 NOTE — Plan of Care (Signed)
  Problem: Clinical Measurements: Goal: Ability to maintain clinical measurements within normal limits will improve Outcome: Progressing Note:  VSS.  NSR on monitor. Goal: Will remain free from infection Outcome: Progressing Note:  No s/s of infection noted. Goal: Respiratory complications will improve Outcome: Progressing Note:  No s/s of respiratory complications noted.

## 2017-11-25 NOTE — Progress Notes (Signed)
Initial Nutrition Assessment  DOCUMENTATION CODES:   Not applicable  INTERVENTION:   Continue: Ensure Enlive po BID, each supplement provides 350 kcal and 20 grams of protein  Encouraged pt to continue oral nutrition supplement at home to maintain weight   NUTRITION DIAGNOSIS:   Inadequate oral intake related to decreased appetite as evidenced by per patient/family report.  GOAL:   Patient will meet greater than or equal to 90% of their needs  MONITOR:   PO intake, Supplement acceptance  REASON FOR ASSESSMENT:   Malnutrition Screening Tool    ASSESSMENT:   Pt with PMH of COPD, 2 pack per day smoker, ETOH use 6 pack/night, stage 1 lung cancer treated with XRT 2/19 admitted with chest pain/STEMI s/p thrombectomy and stent to mid RCA.    Spoke with pt who reports that he lives alone. He eats most of his meals out of the home. He thinks he has lost weight recently, reporting his usual weight is 155 lb. He reports that he always loses some weight in the summer but this year he has lost more than usual. Per chart review this would be a 11 lb/7% weight loss x 6 months.  He usually eats three meals per day. Breakfast: cereal or a biscuit out, Lunch: meat/vegetable plate at restaurant, Dinner: wings at the bar. He usually drinks beer at the bar  He reports that before the summer he was eating more at meal time than he is now. He is not specific in amounts.  He likes his ensure and is drinking them here Meal Completion: 50-100%   Medications reviewed Labs reviewed: B12 and folate WNL    NUTRITION - FOCUSED PHYSICAL EXAM:    Most Recent Value  Orbital Region  No depletion  Upper Arm Region  No depletion  Thoracic and Lumbar Region  No depletion  Buccal Region  No depletion  Temple Region  Mild depletion  Clavicle Bone Region  No depletion  Clavicle and Acromion Bone Region  Mild depletion  Scapular Bone Region  No depletion  Dorsal Hand  Mild depletion  Patellar Region   No depletion  Anterior Thigh Region  No depletion  Posterior Calf Region  No depletion  Edema (RD Assessment)  None  Hair  Reviewed  Eyes  Reviewed  Mouth  Reviewed  Skin  Reviewed  Nails  Reviewed       Diet Order:   Diet Order            Diet Heart Room service appropriate? Yes; Fluid consistency: Thin  Diet effective now              EDUCATION NEEDS:   Education needs have been addressed  Skin:  Skin Assessment: Reviewed RN Assessment  Last BM:  8/13  Height:   Ht Readings from Last 1 Encounters:  11/23/17 5\' 9"  (1.753 m)    Weight:   Wt Readings from Last 1 Encounters:  11/24/17 65.3 kg    Ideal Body Weight:  (P) 72.7 kg  BMI:  Body mass index is 21.26 kg/m.  Estimated Nutritional Needs:   Kcal:  2633-3545  Protein:  85-100 grams  Fluid:  > 1.7 L/day  Maylon Peppers RD, LDN, CNSC [phone] Pager [phone] After Hours Pager

## 2017-11-25 NOTE — Care Management Note (Signed)
Case Management Note Marvetta Gibbons RN,BSN Unit 2H 1-22 - RN Care Coordinator (Case Management) [phone]  Patient Details  Name: Adam Bass MRN: [id number] Date of Birth: 12-Oct-1950  Subjective/Objective:   Pt admitted with STEMI, s/p stenting                Action/Plan: PTA pt lived at home, independent. Referral for Brilinta needs received. Per insurance check- pt copay cost $40- Spoke with pt at bedside- coverage info shared with pt and pt provided copay assist card to use. Pt still works and is not on Medicare yet. Pharmacy that pt uses is [facility] in [place]. Pt states he has transportation home. No further CM needs noted at this time, will follow.   Expected Discharge Date:                  Expected Discharge Plan:  Home/Self Care  In-House Referral:     Discharge planning Services  CM Consult, Medication Assistance  Post Acute Care Choice:    Choice offered to:     DME Arranged:    DME Agency:     HH Arranged:    HH Agency:     Status of Service:  In process, will continue to follow  If discussed at Long Length of Stay Meetings, dates discussed:    Discharge Disposition: home/self care   Additional Comments:  Adam Patricia, RN 11/25/2017, 9:42 AM

## 2017-11-26 ENCOUNTER — Encounter (HOSPITAL_COMMUNITY): Payer: Self-pay | Admitting: General Practice

## 2017-11-26 LAB — HEPATIC FUNCTION PANEL
ALK PHOS: 89 U/L (ref 38–126)
ALT: 98 U/L — ABNORMAL HIGH (ref 0–44)
AST: 114 U/L — AB (ref 15–41)
Albumin: 2.9 g/dL — ABNORMAL LOW (ref 3.5–5.0)
Bilirubin, Direct: 0.4 mg/dL — ABNORMAL HIGH (ref 0.0–0.2)
Indirect Bilirubin: 1 mg/dL — ABNORMAL HIGH (ref 0.3–0.9)
TOTAL PROTEIN: 5.8 g/dL — AB (ref 6.5–8.1)
Total Bilirubin: 1.4 mg/dL — ABNORMAL HIGH (ref 0.3–1.2)

## 2017-11-26 LAB — BASIC METABOLIC PANEL
ANION GAP: 6 (ref 5–15)
BUN: 12 mg/dL (ref 8–23)
CHLORIDE: 102 mmol/L (ref 98–111)
CO2: 28 mmol/L (ref 22–32)
Calcium: 8.6 mg/dL — ABNORMAL LOW (ref 8.9–10.3)
Creatinine, Ser: 0.72 mg/dL (ref 0.61–1.24)
GFR calc Af Amer: >60 mL/min (ref >60)
GFR calc non Af Amer: >60 mL/min (ref >60)
Glucose, Bld: 99 mg/dL (ref 70–99)
POTASSIUM: 4.2 mmol/L (ref 3.5–5.1)
Sodium: 136 mmol/L (ref 135–145)

## 2017-11-26 MED FILL — Nitroglycerin IV Soln 100 MCG/ML in D5W: INTRA_ARTERIAL | Qty: 10 | Status: AC

## 2017-11-26 NOTE — Progress Notes (Addendum)
   11/26/17 1200  Clinical Encounter Type  Visited With Patient;Patient and family together  Visit Type Initial  Referral From Nurse  Consult/Referral To Chaplain  Spiritual Encounters  Spiritual Needs Brochure;Emotional  Stress Factors  Patient Stress Factors Exhausted    This was a visit for consult on a pt who wanted POA. Patient was finishing his lunch when I arrived. Pt's daughter on-site. Both pt and daughter had questions to ask chaplain about POA. Chaplain had meaningful conversation and explanation about the AD papers. Pt and his daughter were very receptive and appreciative. Chaplain also provided empathic, reflective and listening ear. They will call as soon as they're ready for completion/ notary.  Miranda Garber a Medical sales representative, [employer]

## 2017-11-26 NOTE — Progress Notes (Signed)
CARDIAC REHAB PHASE I   PRE:  Rate/Rhythm: 69 SR    BP: sitting 103/60    SaO2:   MODE:  Ambulation: 550 ft   POST:  Rate/Rhythm: 80 SR    BP: sitting 121/69     SaO2:   Pt eager to d/c. No c/o walking. BP increased after walk. Return to recliner. Reviewed ed, pt to read information when he gets home. [place], ACSM 11/26/2017 2:18 PM

## 2017-11-26 NOTE — Progress Notes (Addendum)
Progress Note  Patient Name: Adam Bass Date of Encounter: 11/26/2017  Primary Cardiologist: No primary care provider on file.  Subjective   Feeling well this morning. No chest pain.   Inpatient Medications    Scheduled Meds: . aspirin  81 mg Oral Daily  . bisoprolol  2.5 mg Oral Daily  . enoxaparin (LOVENOX) injection  40 mg Subcutaneous Q24H  . feeding supplement (ENSURE ENLIVE)  237 mL Oral BID BM  . fluticasone furoate-vilanterol  1 puff Inhalation Daily  . mouth rinse  15 mL Mouth Rinse BID  . montelukast  10 mg Oral QHS  . pantoprazole  40 mg Oral BH-q7a  . sodium chloride flush  3 mL Intravenous Q12H  . tamsulosin  0.4 mg Oral Daily  . ticagrelor  90 mg Oral BID  . umeclidinium bromide  1 puff Inhalation Daily   Continuous Infusions: . sodium chloride    . sodium chloride Stopped (11/25/17 0917)   PRN Meds: sodium chloride, acetaminophen, albuterol, LORazepam, ondansetron (ZOFRAN) IV, sodium chloride, sodium chloride flush   Vital Signs    Vitals:   11/25/17 2032 11/26/17 0005 11/26/17 0522 11/26/17 0929  BP: 96/65 (!) 94/50 (!) 93/57 (!) 90/56  Pulse: 65 64 70 (!) 56  Resp:  19 17   Temp: 98.5 F (36.9 C) 98.8 F (37.1 C) 99 F (37.2 C) 98.6 F (37 C)  TempSrc: Oral Oral Oral Oral  SpO2: 96% 99% 98% 96%  Weight:   65.8 kg   Height:        Intake/Output Summary (Last 24 hours) at 11/26/2017 0939 Last data filed at 11/25/2017 2035 Gross per 24 hour  Intake 720 ml  Output -  Net 720 ml   Filed Weights   11/23/17 1546 11/24/17 0145 11/26/17 0522  Weight: 78.9 kg 65.3 kg 65.8 kg    Telemetry    SR - Personally Reviewed  ECG    SR with TWI in inferior leads - Personally Reviewed  Physical Exam   General: Well developed, well nourished, male appearing in no acute distress. Head: Normocephalic, atraumatic.  Neck: Supple, no JVD. Lungs:  Resp regular and unlabored, CTA. Heart: RRR, S1, S2, no murmur; no rub. Abdomen: Soft,  non-tender, non-distended with normoactive bowel sounds.  Extremities: No clubbing, cyanosis, edema. Distal pedal pulses are 2+ bilaterally. Right radial site stable.  Neuro: Alert and oriented X 3. Moves all extremities spontaneously. Psych: Normal affect.  Labs    Chemistry Recent Labs  Lab 11/23/17 1532  11/24/17 0609 11/25/17 0233 11/26/17 0338  NA 137   < > 137 138 136  K 4.2   < > 4.3 3.9 4.2  CL 106   < > 105 105 102  CO2 16*  --  25 25 28   GLUCOSE 198*   < > 103* 100* 99  BUN 11   < > 14 11 12   CREATININE 0.71   < > 0.82 0.71 0.72  CALCIUM 9.0  --  8.4* 8.3* 8.6*  PROT 6.8  --   --  5.8* 5.8*  ALBUMIN 3.5  --   --  2.9* 2.9*  AST 106*  --   --  185* 114*  ALT QUANTITY NOT SUFFICIENT, UNABLE TO PERFORM TEST  --   --  107* 98*  ALKPHOS 112  --   --  88 89  BILITOT QUANTITY NOT SUFFICIENT, UNABLE TO PERFORM TEST  --   --  1.4* 1.4*  GFRNONAA >60  --  >  60 >60 >60  GFRAA >60  --  >60 >60 >60  ANIONGAP 15  --  7 8 6    < > = values in this interval not displayed.     Hematology Recent Labs  Lab 11/23/17 1802 11/24/17 0609 11/25/17 0233  WBC 11.1* 11.2* 10.8*  RBC 4.57 4.03* 3.96*  HGB 17.4* 14.7 14.3  HCT 50.1 43.0 42.6  MCV 109.6* 106.7* 107.6*  MCH 38.1* 36.5* 36.1*  MCHC 34.7 34.2 33.6  RDW 12.2 12.0 11.9  PLT 230 208 205    Cardiac Enzymes Recent Labs  Lab 11/23/17 1532 11/23/17 1802 11/24/17 0024 11/24/17 0609  TROPONINI 0.02 2.67* >65.00* >65.00*    Recent Labs  Lab 11/23/17 1543  TROPIPOC 0.02     BNPNo results for input(s): BNP, PROBNP in the last 168 hours.   DDimer No results for input(s): DDIMER in the last 168 hours.    Radiology    No results found.  Cardiac Studies   Cath: 11/23/17   Prox LAD lesion is 25% stenosed.  1st Mrg lesion is 25% stenosed.  Post Atrio lesion is 70% stenosed.  Dist RCA lesion is 25% stenosed.  Mid RCA lesion is 100% stenosed.  After percutaneous thrombectomy, a drug-eluting stent was  successfully placed using a STENT SYNERGY DES 3X28.  Post intervention, there is a 0% residual stenosis.  The left ventricular systolic function is normal.  LV end diastolic pressure is moderately elevated. LVEDP 27 mm Hg.  The left ventricular ejection fraction is 45-50% by visual estimate.  There is no aortic valve stenosis.  Recommend uninterrupted dual antiplatelet therapy with Aspirin 81mg  daily and Ticagrelor 90mg  twice daily for a minimum of 12 months (ACS - Class I recommendation).  If antiplatelet therapy needed to be stopped earlier due to need for other invasive procedure, this could be discussed since a Synergy stent was placed.   Watch in ICU. Continue tirofiban for a few more hours. No beta blocker due to some bradycardia. He will need statin as well.   BP responded to half amp of atropine. IV fluid limited by increased LVEDP.      TTE: 11/24/17  Study Conclusions  - Left ventricle: Cannot fully evaluate regional wall motion as   endocardium is not well seen in some views The basal nferior wall   does appear hypokinetic. OVerall LVEF is normal. The cavity size   was normal. Wall thickness was normal. Systolic function was   normal. The estimated ejection fraction was in the range of 55%   to 60%. - Right ventricle: The cavity size was mildly dilated.  Patient Profile     67 y.o. male who presented from [facility] with inferior STEMI taken emergently to cath lab and underwent thrombectomy/DES stent to mid RCA.   Assessment & Plan    1. Acute Inferior STEMI: s/p thrombectomy/DES stent to mid RCA. Mild concomitant CAD as above.  No recurrent anginal symptoms. Trop > 65. EF 55 - 60% on echo. Plan for DAPT with ASA/Brilinta.   2. Hypotension: initial ECG suggestive of possible RV involvment.  Mild RV dilation on echo Added low dose bisoprolol yesterday with blood pressures tolerating but remains soft. Will hold off on ARB today.   3.  Significant tobacco :  2 PPD. Cessation advised.  4. ETOH: drinking almost a 6 pack /night. No signs of ETOH withdrawal  5. HLD: LDL 84; started on atorvastatin 80 mg post MI. LFTs increased  Statin on hold for  now. May be able to restart in the am.   6. Increased LFTs:  H/o elevated LFTs in 09/2017; trending down.  -- follow   7. Macrocytosis: MCV 109;  Folate 14.5; B12 473   8. Lung Ca: stage 1  9. COPD: on trilogy at home. Now on incruse ellipta in hospital and breo ellipta  Signed, [name], NP  11/26/2017, 9:39 AM  Pager # [phone]   For questions or updates, please contact [place] Please consult [email or web address] for contact info under Cardiology/STEMI.   Patient seen and examined. Agree with assessment and plan. No recurrent chest pain. Started on bisoprolol yesterday/. BP soft precluding addition of ARB or bisoprolol titration for now.  LFTs elevated, therefore, not on statin for now.  Ambulate, anticipate DC tomorrow.    Troy Sine, MD, [facility] 11/26/2017 1:53 PM

## 2017-11-27 DIAGNOSIS — F17200 Nicotine dependence, unspecified, uncomplicated: Secondary | ICD-10-CM

## 2017-11-27 DIAGNOSIS — R7989 Other specified abnormal findings of blood chemistry: Secondary | ICD-10-CM

## 2017-11-27 DIAGNOSIS — F1010 Alcohol abuse, uncomplicated: Secondary | ICD-10-CM

## 2017-11-27 DIAGNOSIS — I219 Acute myocardial infarction, unspecified: Secondary | ICD-10-CM

## 2017-11-27 DIAGNOSIS — E785 Hyperlipidemia, unspecified: Secondary | ICD-10-CM

## 2017-11-27 DIAGNOSIS — R945 Abnormal results of liver function studies: Secondary | ICD-10-CM

## 2017-11-27 LAB — HEPATIC FUNCTION PANEL
ALT: 96 U/L — AB (ref 0–44)
AST: 93 U/L — ABNORMAL HIGH (ref 15–41)
Albumin: 2.9 g/dL — ABNORMAL LOW (ref 3.5–5.0)
Alkaline Phosphatase: 92 U/L (ref 38–126)
BILIRUBIN INDIRECT: 1.2 mg/dL — AB (ref 0.3–0.9)
Bilirubin, Direct: 0.3 mg/dL — ABNORMAL HIGH (ref 0.0–0.2)
TOTAL PROTEIN: 6 g/dL — AB (ref 6.5–8.1)
Total Bilirubin: 1.5 mg/dL — ABNORMAL HIGH (ref 0.3–1.2)

## 2017-11-27 LAB — CBC
HCT: 40.1 % (ref 39.0–52.0)
Hemoglobin: 13.7 g/dL (ref 13.0–17.0)
MCH: 36.3 pg — AB (ref 26.0–34.0)
MCHC: 34.2 g/dL (ref 30.0–36.0)
MCV: 106.4 fL — ABNORMAL HIGH (ref 78.0–100.0)
PLATELETS: 193 10*3/uL (ref 150–400)
RBC: 3.77 MIL/uL — AB (ref 4.22–5.81)
RDW: 11.4 % — ABNORMAL LOW (ref 11.5–15.5)
WBC: 11.3 10*3/uL — ABNORMAL HIGH (ref 4.0–10.5)

## 2017-11-27 LAB — BASIC METABOLIC PANEL
Anion gap: 7 (ref 5–15)
BUN: 13 mg/dL (ref 8–23)
CO2: 28 mmol/L (ref 22–32)
Calcium: 8.5 mg/dL — ABNORMAL LOW (ref 8.9–10.3)
Chloride: 103 mmol/L (ref 98–111)
Creatinine, Ser: 0.68 mg/dL (ref 0.61–1.24)
GFR calc Af Amer: >60 mL/min (ref >60)
Glucose, Bld: 103 mg/dL — ABNORMAL HIGH (ref 70–99)
POTASSIUM: 4 mmol/L (ref 3.5–5.1)
SODIUM: 138 mmol/L (ref 135–145)

## 2017-11-27 MED ORDER — EZETIMIBE 10 MG PO TABS: 10.0000 mg | ORAL_TABLET | Freq: Every day | ORAL | 1 refills | Status: DC

## 2017-11-27 MED ORDER — BLOOD PRESSURE KIT: 1.0000 [IU] | PACK | Freq: Every day | 0 refills | Status: DC

## 2017-11-27 MED ORDER — TICAGRELOR 90 MG PO TABS: 90.0000 mg | ORAL_TABLET | Freq: Two times a day (BID) | ORAL | 2 refills | Status: DC

## 2017-11-27 MED ORDER — BISOPROLOL FUMARATE 5 MG PO TABS: 2.5000 mg | ORAL_TABLET | Freq: Every day | ORAL | 1 refills | Status: DC

## 2017-11-27 NOTE — Discharge Summary (Addendum)
Discharge Summary    Patient ID: Adam Bass,  MRN: [id number], DOB/AGE: [birth year] 68 y.o.  Admit date: 11/23/2017 Discharge date: 11/27/2017  Primary Care Provider: Sinda Du Primary Cardiologist: New to [place] (no Appts at the time of discharge)  Discharge Diagnoses    Active Problems:   Acute ST elevation myocardial infarction (STEMI) of inferior wall (HCC)   Heart attack ([place])   Hyperlipidemia with target LDL less than 70   LFT elevation   Smoking   ETOH abuse   Allergies Allergies  Allergen Reactions  . Bee Venom Hives    Diagnostic Studies/Procedures    Cath: 11/23/17   Prox LAD lesion is 25% stenosed.  1st Mrg lesion is 25% stenosed.  Post Atrio lesion is 70% stenosed.  Dist RCA lesion is 25% stenosed.  Mid RCA lesion is 100% stenosed.  After percutaneous thrombectomy, a drug-eluting stent was successfully placed using a STENT SYNERGY DES 3X28.  Post intervention, there is a 0% residual stenosis.  The left ventricular systolic function is normal.  LV end diastolic pressure is moderately elevated. LVEDP 27 mm Hg.  The left ventricular ejection fraction is 45-50% by visual estimate.  There is no aortic valve stenosis.   Recommend uninterrupted dual antiplatelet therapy with Aspirin 81mg  daily and Ticagrelor 90mg  twice daily for a minimum of 12 months (ACS - Class I recommendation).   If antiplatelet therapy needed to be stopped earlier due to need for other invasive procedure, this could be discussed since a Synergy stent was placed.   Watch in ICU.  Continue tirofiban for a few more hours.  No beta blocker due to some bradycardia.  He will need statin as well.   BP responded to half amp of atropine.  IV fluid limited by increased LVEDP.    TTE: 11/24/17  Study Conclusions  - Left ventricle: Cannot fully evaluate regional wall motion as   endocardium is not well seen in some views The basal nferior wall   does appear  hypokinetic. OVerall LVEF is normal. The cavity size   was normal. Wall thickness was normal. Systolic function was   normal. The estimated ejection fraction was in the range of 55%   to 60%. - Right ventricle: The cavity size was mildly dilated. __________   History of Present Illness     67 yo with history of COPD and lung cancer treated with radiation earlier this year presented to the ER with chest pain.  The chest pain had been going on for about 2 hours.  It improved with morphine but did not completely resolved.  SBP 80s initially so he was not given NTG.  BP improved with IV fluids, SBP 120s.  He had no prior history of cardiac disease.  He smoked close to 2 ppd.  He also would drink about 6 beers/night. He was not short of breath currently.   Initial ECG showed ST depression/TWI.  Repeat ECG showed acute inferoposterior MI.  Patient has had ASA and was given heparin.   Hospital Course     Underwent cardiac catheterization with 100% mid RCA lesion noted with successful thrombectomy and DES x1.  Did have mild nonobstructive disease in the LAD, OM and distal RCA with plans to treat medically.  Plan for DAPT with aspirin/Brilinta for at least one year.  LV gram noted EF at 40 to 45%.  Did have some bradycardia and low blood pressure while in the lab which improved after given atropine.  Follow-up  echocardiogram showed EF of 55 to 60% with basal inferior wall motion hypokinesis.  His troponin peaked at greater than 65.  Blood pressures remain soft but were able to add low-dose bisoprolol at 2.5 mg daily with pressure tolerating.  Smoking/ETOH cessation was advised throughout admission, and patient plans to use nicotine patch and so the time of discharge.  LDL was 84 and he was placed on high-dose statin post MI, but LFTs increased and statin was held.  LFTs did improve and were trending down at the time of discharge AST 93 ALT 96.  Added Zetia 10mg  at the time of discharge. He worked well with  cardiac rehab without recurrent chest pain. Consider titrating blood pressure medications at follow up appt.   General: Well developed, well nourished, male appearing in no acute distress. Head: Normocephalic, atraumatic.  Neck: Supple, JVD. Lungs:  Resp regular and unlabored, CTA. Heart: RRR, S1, S2, no murmur; no rub. Abdomen: Soft, non-tender, non-distended with normoactive bowel sounds. No hepatomegaly. No rebound/guarding. No obvious abdominal masses. Extremities: No clubbing, cyanosis, edema. Distal pedal pulses are 2+ bilaterally. R radial cath site stable without bruising or hematoma Neuro: Alert and oriented X 3. Moves all extremities spontaneously. Psych: Normal affect.  [name] was seen by Dr. Claiborne Billings and determined stable for discharge home. Follow up in the office has been arranged. Medications are listed below.   _____________  Discharge Vitals Blood pressure (!) 91/56, pulse 67, temperature 98.6 F (37 C), temperature source Oral, resp. rate 16, height 5\' 9"  (1.753 m), weight 65.1 kg, SpO2 92 %.  Filed Weights   11/24/17 0145 11/26/17 0522 11/27/17 0539  Weight: 65.3 kg 65.8 kg 65.1 kg    Labs & Radiologic Studies    CBC Recent Labs    11/25/17 0233 11/27/17 0406  WBC 10.8* 11.3*  NEUTROABS 6.0  --   HGB 14.3 13.7  HCT 42.6 40.1  MCV 107.6* 106.4*  PLT 205 109   Basic Metabolic Panel Recent Labs    11/26/17 0338 11/27/17 0406  NA 136 138  K 4.2 4.0  CL 102 103  CO2 28 28  GLUCOSE 99 103*  BUN 12 13  CREATININE 0.72 0.68  CALCIUM 8.6* 8.5*   Liver Function Tests Recent Labs    11/26/17 0338 11/27/17 0406  AST 114* 93*  ALT 98* 96*  ALKPHOS 89 92  BILITOT 1.4* 1.5*  PROT 5.8* 6.0*  ALBUMIN 2.9* 2.9*   No results for input(s): LIPASE, AMYLASE in the last 72 hours. Cardiac Enzymes No results for input(s): CKTOTAL, CKMB, CKMBINDEX, TROPONINI in the last 72 hours. BNP Invalid input(s): POCBNP D-Dimer No results for input(s): DDIMER  in the last 72 hours. Hemoglobin A1C No results for input(s): HGBA1C in the last 72 hours. Fasting Lipid Panel No results for input(s): CHOL, HDL, LDLCALC, TRIG, CHOLHDL, LDLDIRECT in the last 72 hours. Thyroid Function Tests No results for input(s): TSH, T4TOTAL, T3FREE, THYROIDAB in the last 72 hours.  Invalid input(s): FREET3 _____________  Dg Chest Port 1 View  Result Date: 11/23/2017 CLINICAL DATA:  Left chest pain EXAM: PORTABLE CHEST 1 VIEW COMPARISON:  August 07, 2017 FINDINGS: The heart size and mediastinal contours are within normal limits. Both lungs are clear. The lungs are hyperinflated. The visualized skeletal structures are unremarkable. IMPRESSION: No active cardiopulmonary disease.  Hyperinflated lungs. Electronically Signed   By: Abelardo Diesel M.D.   On: 11/23/2017 16:11   Disposition   Pt is being discharged home today in  good condition.  Follow-up Plans & Appointments    Follow-up Information    [name], NP Follow up on 12/09/2017.   Specialties:  Cardiology, Radiology Why:  at 11:30am for your follow up appt.  Contact information: [place] [id number] [phone]          Discharge Instructions    Amb Referral to Cardiac Rehabilitation   Complete by:  As directed    Diagnosis:   Coronary Stents STEMI PTCA     Call MD for:  redness, tenderness, or signs of infection (pain, swelling, redness, odor or green/yellow discharge around incision site)   Complete by:  As directed    Diet - low sodium heart healthy   Complete by:  As directed    Discharge instructions   Complete by:  As directed    Radial Site Care Refer to this sheet in the next few weeks. These instructions provide you with information on caring for yourself after your procedure. Your caregiver may also give you more specific instructions. Your treatment has been planned according to current medical practices, but problems sometimes occur. Call your  caregiver if you have any problems or questions after your procedure. HOME CARE INSTRUCTIONS You may shower the day after the procedure.Remove the bandage (dressing) and gently wash the site with plain soap and water.Gently pat the site dry.  Do not apply powder or lotion to the site.  Do not submerge the affected site in water for 3 to 5 days.  Inspect the site at least twice daily.  Do not flex or bend the affected arm for 24 hours.  No lifting over 5 pounds (2.3 kg) for 5 days after your procedure.  Do not drive home if you are discharged the same day of the procedure. Have someone else drive you.  You may drive 24 hours after the procedure unless otherwise instructed by your caregiver.  What to expect: Any bruising will usually fade within 1 to 2 weeks.  Blood that collects in the tissue (hematoma) may be painful to the touch. It should usually decrease in size and tenderness within 1 to 2 weeks.  SEEK IMMEDIATE MEDICAL CARE IF: You have unusual pain at the radial site.  You have redness, warmth, swelling, or pain at the radial site.  You have drainage (other than a small amount of blood on the dressing).  You have chills.  You have a fever or persistent symptoms for more than 72 hours.  You have a fever and your symptoms suddenly get worse.  Your arm becomes pale, cool, tingly, or numb.  You have heavy bleeding from the site. Hold pressure on the site.   PLEASE DO NOT MISS ANY DOSES OF YOUR BRILINTA/!!!!! Also keep a log of you blood pressures and bring back to your follow up appt. Please call the office with any questions.   Patients taking blood thinners should generally stay away from medicines like ibuprofen, Advil, Motrin, naproxen, and Aleve due to risk of stomach bleeding. You may take Tylenol as directed or talk to your primary doctor about alternatives.   Increase activity slowly   Complete by:  As directed       Discharge Medications     Medication List    TAKE  these medications   aspirin EC 81 MG tablet Take 81 mg by mouth daily.   bisoprolol 5 MG tablet Commonly known as:  ZEBETA Take 0.5 tablets (2.5 mg total) by  mouth daily. Start taking on:  11/28/2017   ezetimibe 10 MG tablet Commonly known as:  ZETIA Take 1 tablet (10 mg total) by mouth daily.   fluticasone 50 MCG/ACT nasal spray Commonly known as:  FLONASE Place 1 spray into both nostrils daily.   Fluticasone-Umeclidin-Vilant 100-62.5-25 MCG/INH Aepb Inhale 1 puff into the lungs daily.   HYDROMET 5-1.5 MG/5ML syrup Generic drug:  HYDROcodone-homatropine Take 5 mLs by mouth 4 (four) times daily as needed for cough.   LORazepam 0.5 MG tablet Commonly known as:  ATIVAN Take 1 tablet (0.5 mg total) by mouth every 6 (six) hours as needed for anxiety.   multivitamin with minerals tablet Take 1 tablet by mouth daily.   POTASSIUM PO Take 1 tablet by mouth daily.   PROAIR HFA 108 (90 Base) MCG/ACT inhaler Generic drug:  albuterol Inhale 2 puffs into the lungs every 6 (six) hours as needed for wheezing.   ticagrelor 90 MG Tabs tablet Commonly known as:  BRILINTA Take 1 tablet (90 mg total) by mouth 2 (two) times daily.   VISINE OP Place 1 drop into both eyes daily as needed (Dry Eyes).   VITAMIN E PO Take 1 tablet by mouth daily.        Acute coronary syndrome (MI, NSTEMI, STEMI, etc) this admission?: Yes.     AHA/ACC Clinical Performance & Quality Measures: 1. Aspirin prescribed? - Yes 2. ADP Receptor Inhibitor (Plavix/Clopidogrel, Brilinta/Ticagrelor or Effient/Prasugrel) prescribed (includes medically managed patients)? - Yes 3. Beta Blocker prescribed? - Yes 4. High Intensity Statin (Lipitor 40-80mg  or Crestor 20-40mg ) prescribed? - No - elevated LFTs  5. EF assessed during THIS hospitalization? - Yes 6. For EF <40%, was ACEI/ARB prescribed? - Not Applicable (EF >/= 26%) 7. For EF <40%, Aldosterone Antagonist (Spironolactone or Eplerenone) prescribed? - Not  Applicable (EF >/= 94%) 8. Cardiac Rehab Phase II ordered (Included Medically managed Patients)? - Yes      Outstanding Labs/Studies   LFTs at follow up appt. Consider restarting statin if improved.   Duration of Discharge Encounter   Greater than 30 minutes including physician time.  Signed, [name] NP-C 11/27/2017, 11:43 AM   Patient seen and examined. Agree with assessment and plan. Feels well; no recurrent symptoms. LFTs elevated, not on zetia;  Plan to start zetia 10 mg. Tolerating low dose bisoprolol. BP soft; titrate meds as outpatient,. Again discussed smoking cessation and limimitation of ETOH.  OK to dc today and f/u in [place] office.   Troy Sine, MD, [facility] 11/27/2017 11:43 AM

## 2017-11-27 NOTE — Progress Notes (Signed)
CARDIAC REHAB PHASE I   PRE:  Rate/Rhythm: 66 SR    BP: sitting 118/66    SaO2:   MODE:  Ambulation: 750 ft   POST:  Rate/Rhythm: 80 SR    BP: sitting 122/66     SaO2:   Tolerated well. Had to stop to rest x1 due to SOB with increased distance. Overall he did well. He is thinking more of quitting smoking now that it has been 4 days without nicotine. Gave tips. He asked about beer and I encouraged him to cut back. He really enjoys community at the sports bar and playing music.  [phone]   [place], ACSM 11/27/2017 10:06 AM

## 2017-11-27 NOTE — Discharge Instructions (Signed)
Heart Attack A heart attack (myocardial infarction, MI) causes damage to the heart that cannot be fixed. A heart attack often happens when a blood clot or other blockage cuts blood flow to the heart. When this happens, certain areas of the heart begin to die. This causes the pain you feel during a heart attack. Follow these instructions at home:  Take medicine as told by your doctor. You may need medicine to: ? Keep your blood from clotting too easily. ? Control your blood pressure. ? Lower your cholesterol. ? Control abnormal heart rhythms.  Change certain behaviors as told by your doctor. This may include: ? Quitting smoking. ? Being active. ? Eating a heart-healthy diet. Ask your doctor for help with this diet. ? Keeping a healthy weight. ? Keeping your diabetes under control. ? Lessening stress. ? Limiting how much alcohol you drink. Do not take these medicines unless your doctor says that you can:  Nonsteroidal anti-inflammatory drugs (NSAIDs). These include: ? Ibuprofen. ? Naproxen. ? Celecoxib.  Vitamin supplements that have vitamin A, vitamin E, or both.  Hormone therapy that contains estrogen with or without progestin.  Get help right away if:  You have sudden chest discomfort.  You have sudden discomfort in your: ? Arms. ? Back. ? Neck. ? Jaw.  You have shortness of breath at any time.  You have sudden sweating or clammy skin.  You feel sick to your stomach (nauseous) or throw up (vomit).  You suddenly get light-headed or dizzy.  You feel your heart beating fast or skipping beats. These symptoms may be an emergency. Do not wait to see if the symptoms will go away. Get medical help right away. Call your local emergency services (911 in the [place] Do not drive yourself to the hospital. This information is not intended to replace advice given to you by your health care provider. Make sure you discuss any questions you have with your health care  provider. Document Released: 10/01/2011 Document Revised: 09/07/2015 Document Reviewed: 06/04/2013 Elsevier Interactive Patient Education  2017 [place] Refer to this sheet in the next few weeks. These instructions provide you with information about caring for yourself after your procedure. Your health care provider may also give you more specific instructions. Your treatment has been planned according to current medical practices, but problems sometimes occur. Call your health care provider if you have any problems or questions after your procedure. What can I expect after the procedure? After your procedure, it is typical to have the following:  Bruising at the radial site that usually fades within 1-2 weeks.  Blood collecting in the tissue (hematoma) that may be painful to the touch. It should usually decrease in size and tenderness within 1-2 weeks.  Follow these instructions at home:  Take medicines only as directed by your health care provider.  You may shower 24-48 hours after the procedure or as directed by your health care provider. Remove the bandage (dressing) and gently wash the site with plain soap and water. Pat the area dry with a clean towel. Do not rub the site, because this may cause bleeding.  Do not take baths, swim, or use a hot tub until your health care provider approves.  Check your insertion site every day for redness, swelling, or drainage.  Do not apply powder or lotion to the site.  Do not flex or bend the affected arm for 24 hours or as directed by your health care provider.  Do not  push or pull heavy objects with the affected arm for 24 hours or as directed by your health care provider.  Do not lift over 10 lb (4.5 kg) for 5 days after your procedure or as directed by your health care provider.  Ask your health care provider when it is okay to: ? Return to work or school. ? Resume usual physical activities or sports. ? Resume  sexual activity.  Do not drive home if you are discharged the same day as the procedure. Have someone else drive you.  You may drive 24 hours after the procedure unless otherwise instructed by your health care provider.  Do not operate machinery or power tools for 24 hours after the procedure.  If your procedure was done as an outpatient procedure, which means that you went home the same day as your procedure, a responsible adult should be with you for the first 24 hours after you arrive home.  Keep all follow-up visits as directed by your health care provider. This is important. Contact a health care provider if:  You have a fever.  You have chills.  You have increased bleeding from the radial site. Hold pressure on the site. Get help right away if:  You have unusual pain at the radial site.  You have redness, warmth, or swelling at the radial site.  You have drainage (other than a small amount of blood on the dressing) from the radial site.  The radial site is bleeding, and the bleeding does not stop after 30 minutes of holding steady pressure on the site.  Your arm or hand becomes pale, cool, tingly, or numb. This information is not intended to replace advice given to you by your health care provider. Make sure you discuss any questions you have with your health care provider. Document Released: 05/04/2010 Document Revised: 09/07/2015 Document Reviewed: 10/18/2013 Elsevier Interactive Patient Education  2018 [place] Heart-healthy meal planning includes:  Limiting unhealthy fats.  Increasing healthy fats.  Making other small dietary changes.  You may need to talk with your doctor or a diet specialist (dietitian) to create an eating plan that is right for you. What types of fat should I choose?  Choose healthy fats. These include olive oil and canola oil, flaxseeds, walnuts, almonds, and seeds.  Eat more omega-3 fats. These include  salmon, mackerel, sardines, tuna, flaxseed oil, and ground flaxseeds. Try to eat fish at least twice each week.  Limit saturated fats. ? Saturated fats are often found in animal products, such as meats, butter, and cream. ? Plant sources of saturated fats include palm oil, palm kernel oil, and coconut oil.  Avoid foods with partially hydrogenated oils in them. These include stick margarine, some tub margarines, cookies, crackers, and other baked goods. These contain trans fats. What general guidelines do I need to follow?  Check food labels carefully. Identify foods with trans fats or high amounts of saturated fat.  Fill one half of your plate with vegetables and green salads. Eat 4-5 servings of vegetables per day. A serving of vegetables is: ? 1 cup of raw leafy vegetables. ?  cup of raw or cooked cut-up vegetables. ?  cup of vegetable juice.  Fill one fourth of your plate with whole grains. Look for the word "whole" as the first word in the ingredient list.  Fill one fourth of your plate with lean protein foods.  Eat 4-5 servings of fruit per day. A serving of fruit  is: ? One medium whole fruit. ?  cup of dried fruit. ?  cup of fresh, frozen, or canned fruit. ?  cup of 100% fruit juice.  Eat more foods that contain soluble fiber. These include apples, broccoli, carrots, beans, peas, and barley. Try to get 20-30 g of fiber per day.  Eat more home-cooked food. Eat less restaurant, buffet, and fast food.  Limit or avoid alcohol.  Limit foods high in starch and sugar.  Avoid fried foods.  Avoid frying your food. Try baking, boiling, grilling, or broiling it instead. You can also reduce fat by: ? Removing the skin from poultry. ? Removing all visible fats from meats. ? Skimming the fat off of stews, soups, and gravies before serving them. ? Steaming vegetables in water or broth.  Lose weight if you are overweight.  Eat 4-5 servings of nuts, legumes, and seeds per  week: ? One serving of dried beans or legumes equals  cup after being cooked. ? One serving of nuts equals 1 ounces. ? One serving of seeds equals  ounce or one tablespoon.  You may need to keep track of how much salt or sodium you eat. This is especially true if you have high blood pressure. Talk with your doctor or dietitian to get more information. What foods can I eat? Grains Breads, including [place], white, pita, wheat, raisin, rye, oatmeal, and [place]. Tortillas that are neither fried nor made with lard or trans fat. Low-fat rolls, including hotdog and hamburger buns and English muffins. Biscuits. Muffins. Waffles. Pancakes. Light popcorn. Whole-grain cereals. Flatbread. Melba toast. Pretzels. Breadsticks. Rusks. Low-fat snacks. Low-fat crackers, including oyster, saltine, matzo, graham, animal, and rye. Rice and pasta, including brown rice and pastas that are made with whole wheat. Vegetables All vegetables. Fruits All fruits, but limit coconut. Meats and Other Protein Sources Lean, well-trimmed beef, veal, pork, and lamb. Chicken and [place] without skin. All fish and shellfish. Wild duck, rabbit, pheasant, and venison. Egg whites or low-cholesterol egg substitutes. Dried beans, peas, lentils, and tofu. Seeds and most nuts. Dairy Low-fat or nonfat cheeses, including ricotta, string, and mozzarella. Skim or 1% milk that is liquid, powdered, or evaporated. Buttermilk that is made with low-fat milk. Nonfat or low-fat yogurt. Beverages Mineral water. Diet carbonated beverages. Sweets and Desserts Sherbets and fruit ices. Honey, jam, marmalade, jelly, and syrups. Meringues and gelatins. Pure sugar candy, such as hard candy, jelly beans, gumdrops, mints, marshmallows, and small amounts of dark chocolate. [employer] Eat all sweets and desserts in moderation. Fats and Oils Nonhydrogenated (trans-free) margarines. Vegetable oils, including soybean, sesame, sunflower, olive, peanut,  safflower, corn, canola, and cottonseed. Salad dressings or mayonnaise made with a vegetable oil. Limit added fats and oils that you use for cooking, baking, salads, and as spreads. Other Cocoa powder. Coffee and tea. All seasonings and condiments. The items listed above may not be a complete list of recommended foods or beverages. Contact your dietitian for more options. What foods are not recommended? Grains Breads that are made with saturated or trans fats, oils, or whole milk. Croissants. Butter rolls. Cheese breads. Sweet rolls. Donuts. Buttered popcorn. Chow mein noodles. High-fat crackers, such as cheese or butter crackers. Meats and Other Protein Sources Fatty meats, such as hotdogs, short ribs, sausage, spareribs, bacon, rib eye roast or steak, and mutton. High-fat deli meats, such as salami and bologna. Caviar. Domestic duck and goose. Organ meats, such as kidney, liver, sweetbreads, and heart. Dairy Cream, sour cream, cream cheese, and  creamed cottage cheese. Whole-milk cheeses, including blue (bleu), Monte[place] Jack, [place], [place], [place], [place], Swiss, cheddar, [place], and [place]. Whole or 2% milk that is liquid, evaporated, or condensed. Whole buttermilk. Cream sauce or high-fat cheese sauce. Yogurt that is made from whole milk. Beverages Regular sodas and juice drinks with added sugar. Sweets and Desserts Frosting. Pudding. Cookies. Cakes other than angel food cake. Candy that has milk chocolate or white chocolate, hydrogenated fat, butter, coconut, or unknown ingredients. Buttered syrups. Full-fat ice cream or ice cream drinks. Fats and Oils Gravy that has suet, meat fat, or shortening. Cocoa butter, hydrogenated oils, palm oil, coconut oil, palm kernel oil. These can often be found in baked products, candy, fried foods, nondairy creamers, and whipped toppings. Solid fats and shortenings, including bacon fat, salt pork, lard, and butter. Nondairy cream substitutes, such as coffee  creamers and sour cream substitutes. Salad dressings that are made of unknown oils, cheese, or sour cream. The items listed above may not be a complete list of foods and beverages to avoid. Contact your dietitian for more information. This information is not intended to replace advice given to you by your health care provider. Make sure you discuss any questions you have with your health care provider. Document Released: 10/01/2011 Document Revised: 09/07/2015 Document Reviewed: 09/23/2013 Elsevier Interactive Patient Education  [employer].

## 2017-12-01 DIAGNOSIS — J449 Chronic obstructive pulmonary disease, unspecified: Secondary | ICD-10-CM | POA: Diagnosis not present

## 2017-12-01 DIAGNOSIS — I219 Acute myocardial infarction, unspecified: Secondary | ICD-10-CM | POA: Diagnosis not present

## 2017-12-01 DIAGNOSIS — I2510 Atherosclerotic heart disease of native coronary artery without angina pectoris: Secondary | ICD-10-CM | POA: Diagnosis not present

## 2017-12-01 DIAGNOSIS — C3491 Malignant neoplasm of unspecified part of right bronchus or lung: Secondary | ICD-10-CM | POA: Diagnosis not present

## 2017-12-02 ENCOUNTER — Telehealth: Payer: Self-pay | Admitting: *Deleted

## 2017-12-02 NOTE — Telephone Encounter (Signed)
Called patient to inform of Ct for 12-04-17 - arrival time- 5:45 pm @ [facility] Radiology, no restrictions to test, pt. to get results from Dr. Isidore Moos on 12-05-17 @ 3:20 pm, spoke with patient and he is aware of these  appts.

## 2017-12-02 NOTE — Progress Notes (Signed)
Adam Bass presents for follow up of radiation completed 06/09/17 to his Left Lung. He is here for CT Chest results from 12/04/17. He was recently admitted to the hospital 11/23/17- 11/27/17 for a acute STEMI after sudden onset chest pain. He had a stent placed at that time. He is currently not drinking alcohol. He is smoking 5-10 cigarettes daily, which is down from aproximately 2 packs daily. He is using a nicotine patch. He reports improvement in his shortness of breath since stent was placed recently. He denies a cough. He is eating well, he has improved his diet recently.   BP 110/65   Pulse 64   Temp 98.2 F (36.8 C) (Oral)   Resp 16   Ht 5\' 9"  (1.753 m)   Wt 147 lb 3.2 oz (66.8 kg)   SpO2 96%   BMI 21.74 kg/m    Wt Readings from Last 3 Encounters:  12/05/17 147 lb 3.2 oz (66.8 kg)  11/27/17 143 lb 8 oz (65.1 kg)  09/12/17 146 lb 9.6 oz (66.5 kg)

## 2017-12-04 ENCOUNTER — Ambulatory Visit (HOSPITAL_COMMUNITY)
Admission: RE | Admit: 2017-12-04 | Discharge: 2017-12-04 | Disposition: A | Discharge status: Home / Self Care | Payer: BLUE CROSS/BLUE SHIELD | Source: Ambulatory Visit | Attending: Radiation Oncology | Admitting: Radiation Oncology

## 2017-12-04 DIAGNOSIS — I700 Atherosclerosis of aorta: Secondary | ICD-10-CM | POA: Diagnosis not present

## 2017-12-04 DIAGNOSIS — J439 Emphysema, unspecified: Secondary | ICD-10-CM | POA: Diagnosis not present

## 2017-12-04 DIAGNOSIS — C3432 Malignant neoplasm of lower lobe, left bronchus or lung: Secondary | ICD-10-CM | POA: Insufficient documentation

## 2017-12-04 DIAGNOSIS — C3430 Malignant neoplasm of lower lobe, unspecified bronchus or lung: Secondary | ICD-10-CM | POA: Diagnosis not present

## 2017-12-05 ENCOUNTER — Other Ambulatory Visit: Payer: Self-pay

## 2017-12-05 ENCOUNTER — Ambulatory Visit
Admission: RE | Admit: 2017-12-05 | Discharge: 2017-12-05 | Disposition: A | Discharge status: Home / Self Care | Payer: BLUE CROSS/BLUE SHIELD | Source: Ambulatory Visit | Attending: Radiation Oncology | Admitting: Radiation Oncology

## 2017-12-05 ENCOUNTER — Encounter: Payer: Self-pay | Admitting: Radiation Oncology

## 2017-12-05 VITALS — BP 110/65 | HR 64 | Temp 98.2°F | Resp 16 | Ht 69.0 in | Wt 147.2 lb

## 2017-12-05 DIAGNOSIS — Z79899 Other long term (current) drug therapy: Secondary | ICD-10-CM | POA: Insufficient documentation

## 2017-12-05 DIAGNOSIS — Z923 Personal history of irradiation: Secondary | ICD-10-CM | POA: Diagnosis not present

## 2017-12-05 DIAGNOSIS — Z08 Encounter for follow-up examination after completed treatment for malignant neoplasm: Secondary | ICD-10-CM | POA: Diagnosis not present

## 2017-12-05 DIAGNOSIS — C3432 Malignant neoplasm of lower lobe, left bronchus or lung: Secondary | ICD-10-CM

## 2017-12-05 DIAGNOSIS — Z85118 Personal history of other malignant neoplasm of bronchus and lung: Secondary | ICD-10-CM | POA: Diagnosis not present

## 2017-12-05 DIAGNOSIS — F17210 Nicotine dependence, cigarettes, uncomplicated: Secondary | ICD-10-CM | POA: Insufficient documentation

## 2017-12-05 DIAGNOSIS — Z7982 Long term (current) use of aspirin: Secondary | ICD-10-CM | POA: Diagnosis not present

## 2017-12-05 DIAGNOSIS — J439 Emphysema, unspecified: Secondary | ICD-10-CM | POA: Diagnosis not present

## 2017-12-05 NOTE — Progress Notes (Signed)
Radiation Oncology         [phone] ________________________________  Name: Adam Bass MRN: [id number]  Date: 12/05/2017  DOB: 1951-01-30  Follow-Up Visit Note  Outpatient  CC: Sinda Du, MD  Ardath Sax, MD  Diagnosis and Prior Radiotherapy:    ICD-10-CM   1. 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  CHIEF COMPLAINT: Here for follow-up and surveillance of left lower lung cancer  Narrative:  The patient returns today for routine follow-up since completion of his radiation therapy on 06/09/2017. He had a MI on 11/23/2017 with stent placement and 4 day admission to the hospital. He has been doing well since the stent was placed.   Since his last visit to the office, he underwent a CT Chest w contrast on 12/04/2017 with results showing: Stable central left lower lobe pulmonary nodule compatible with known neoplasm. Lesion previously identified in the left upper lobe is stable. Interval resolution of the new patchy nodularity seen in the posterior right lung base on the previous study. However, since prior exam, there is small airway impaction in the right lower lobe with a different area of clustered nodular airspace disease. Imaging features likely reflect atypical infection although aspiration could have this appearance. Emphysema. Aortic Atherosclerois .   On review of systems, he denies SOB, cough, and any other symptoms. Pertinent positives are listed and detailed within the above HPI. He notes a decrease with smoking cigarettes.   I personally reviewed the patient's imaging prior to today's visit.                                      ALLERGIES:  is  allergic to bee venom.  Meds: Current Outpatient Medications  Medication Sig Dispense Refill  . albuterol (PROAIR HFA) 108 (90 BASE) MCG/ACT inhaler Inhale 2 puffs into the lungs every 6 (six) hours as needed for wheezing.    Marland Kitchen aspirin EC 81 MG tablet Take 81 mg by mouth daily.    . bisoprolol (ZEBETA) 5 MG tablet Take 0.5 tablets (2.5 mg total) by mouth daily. 60 tablet 1  . Blood Pressure KIT 1 Units by Does not apply route daily. 1 each 0  . ezetimibe (ZETIA) 10 MG tablet Take 1 tablet (10 mg total) by mouth daily. 90 tablet 1  . fluticasone (FLONASE) 50 MCG/ACT nasal spray Place 1 spray into both nostrils daily.    . Fluticasone-Umeclidin-Vilant (TRELEGY ELLIPTA) 100-62.5-25 MCG/INH AEPB Inhale 1 puff into the lungs daily. 1 each 3  . Multiple Vitamins-Minerals (MULTIVITAMIN WITH MINERALS) tablet Take 1 tablet by mouth daily.    Marland Kitchen POTASSIUM PO Take 1 tablet by mouth daily.    . Tetrahydrozoline HCl (VISINE OP) Place 1 drop into both eyes daily as needed (Dry Eyes).    . ticagrelor (BRILINTA) 90 MG TABS tablet Take 1 tablet (90 mg total) by mouth 2 (two) times daily. 180 tablet 2  . VITAMIN E PO Take 1 tablet by mouth daily.    Marland Kitchen HYDROMET 5-1.5 MG/5ML syrup Take 5 mLs  by mouth 4 (four) times daily as needed for cough.   0  . LORazepam (ATIVAN) 0.5 MG tablet Take 1 tablet (0.5 mg total) by mouth every 6 (six) hours as needed for anxiety. (Patient not taking: Reported on 09/12/2017) 25 tablet 0   No current facility-administered medications for this encounter.     Physical Findings: The patient is in no acute distress. Patient is alert and oriented.  height is 5' 9"  (1.753 m) and weight is 147 lb 3.2 oz (66.8 kg). His oral temperature is 98.2 F (36.8 C). His blood pressure is 110/65 and his pulse is 64. His respiration is 16 and oxygen saturation is 96%. Marland Kitchen    Heart: Regular in rate and rhythm with no murmurs, rubs, or gallops. Chest: Clear to auscultation bilaterally, with no rhonchi,  wheezes, or rales.  Lab Findings: Lab Results  Component Value Date   WBC 11.3 (H) 11/27/2017   HGB 13.7 11/27/2017   HCT 40.1 11/27/2017   MCV 106.4 (H) 11/27/2017   PLT 193 11/27/2017    Radiographic Findings: Ct Chest Wo Contrast  Result Date: 12/05/2017 CLINICAL DATA:  Left lower lobe lung cancer. EXAM: CT CHEST WITHOUT CONTRAST TECHNIQUE: Multidetector CT imaging of the chest was performed following the standard protocol without IV contrast. COMPARISON:  09/04/2017 FINDINGS: Cardiovascular: The heart size is normal. No substantial pericardial effusion. Coronary artery calcification is evident. Atherosclerotic calcification is noted in the wall of the thoracic aorta. Mediastinum/Nodes: No mediastinal lymphadenopathy. No evidence for gross hilar lymphadenopathy although assessment is limited by the lack of intravenous contrast on today's study. The esophagus has normal imaging features. There is no axillary lymphadenopathy. Lungs/Pleura: Centrilobular and paraseptal emphysema. Biapical pleuroparenchymal scarring again noted with 11 mm nodular component in the left upper lobe unchanged.Small airway impaction medial right lower lobe (4:110) is new in the interval and may represent secretions. As there is also some new mild clustered nodular airspace disease in the posterior right lower lobe (4:153) infection and aspiration are considerations. The more lateral peripheral tree-in-bud disease seen in the right lower lobe on the previous study has resolved in the interval. 7 x 8 mm left lower lobe nodule on the previous study is not substantially changed in the interval, measuring 7 x 7 mm today (4:113). Tiny nodule posterior left costophrenic sulcus (4:67) may be atelectatic. Upper Abdomen: Nodular liver contour compatible with cirrhosis. Stable 2 cm cyst upper pole left kidney. Cystic lesion anterior to the upper pole left kidney has been incompletely visualized but was better characterized on PET-CT  from 04/02/2017 and showed no hypermetabolism on that exam. Musculoskeletal: No worrisome lytic or sclerotic osseous abnormality. IMPRESSION: 1. Stable central left lower lobe pulmonary nodule compatible with known neoplasm. 2. Lesion previously identified in the left upper lobe is stable. 3. Interval resolution of the new patchy nodularity seen in the posterior right lung base on the previous study. However, since prior exam, there is small airway impaction in the right lower lobe with a different area of clustered nodular airspace disease. Imaging features likely reflect atypical infection although aspiration could have this appearance. 4.  Emphysema. (ICD10-J43.9) 5.  Aortic Atherosclerois (ICD10-170.0) Electronically Signed   By: Misty Stanley M.D.   On: 12/05/2017 08:49   Dg Chest Port 1 View  Result Date: 11/23/2017 CLINICAL DATA:  Left chest pain EXAM: PORTABLE CHEST 1 VIEW COMPARISON:  August 07, 2017 FINDINGS: The heart size and mediastinal contours are within normal limits. Both lungs are clear. The lungs  are hyperinflated. The visualized skeletal structures are unremarkable. IMPRESSION: No active cardiopulmonary disease.  Hyperinflated lungs. Electronically Signed   By: Abelardo Diesel M.D.   On: 11/23/2017 16:11    Impression/Plan: This is a very nice gentleman with a history of left lung cancer. Lower lobe; responded well to SBRT. Scan appears stable no evidence of progression or recurrence on recent CT chest. No clinical signs of infection per history or physical exam.   Advised patient to quit smoking to help with recovering. He has started using patches again to quit smoking. I advised the patient to reach out and call [facility]. Discussed with the patient regarding a quit date, to which he is not ready to commit to a quit date at this time, however, he notes that he will try.   I will see him in 3 months with a CT chest prior to the visit. Patient informed to follow up sooner if needed.      _____________________________________   Eppie Gibson, MD  This document serves as a record of services personally performed by Eppie Gibson, MD. It was created on her behalf by [facility], a trained medical scribe. The creation of this record is based on the scribe's personal observations and the provider's statements to them. This document has been checked and approved by the attending provider.

## 2017-12-09 ENCOUNTER — Encounter: Payer: Self-pay | Admitting: Cardiology

## 2017-12-09 ENCOUNTER — Ambulatory Visit (INDEPENDENT_AMBULATORY_CARE_PROVIDER_SITE_OTHER): Payer: BLUE CROSS/BLUE SHIELD | Admitting: Cardiology

## 2017-12-09 VITALS — BP 124/64 | HR 61 | Ht 69.0 in | Wt 147.0 lb

## 2017-12-09 DIAGNOSIS — Z720 Tobacco use: Secondary | ICD-10-CM

## 2017-12-09 DIAGNOSIS — E785 Hyperlipidemia, unspecified: Secondary | ICD-10-CM | POA: Diagnosis not present

## 2017-12-09 DIAGNOSIS — F1010 Alcohol abuse, uncomplicated: Secondary | ICD-10-CM

## 2017-12-09 DIAGNOSIS — E782 Mixed hyperlipidemia: Secondary | ICD-10-CM | POA: Diagnosis not present

## 2017-12-09 DIAGNOSIS — I2510 Atherosclerotic heart disease of native coronary artery without angina pectoris: Secondary | ICD-10-CM

## 2017-12-09 DIAGNOSIS — I2119 ST elevation (STEMI) myocardial infarction involving other coronary artery of inferior wall: Secondary | ICD-10-CM | POA: Diagnosis not present

## 2017-12-09 DIAGNOSIS — R7989 Other specified abnormal findings of blood chemistry: Secondary | ICD-10-CM

## 2017-12-09 DIAGNOSIS — C3400 Malignant neoplasm of unspecified main bronchus: Secondary | ICD-10-CM

## 2017-12-09 DIAGNOSIS — R945 Abnormal results of liver function studies: Secondary | ICD-10-CM

## 2017-12-09 NOTE — Progress Notes (Signed)
Cardiology Office Note   Date:  12/09/2017   ID:  Adam Bass, Adam Bass 05/20/1950, MRN [id number]  PCP:  Sinda Du, MD  Cardiologist:  New--lives in [place].   Dr. Harl Bowie  Chief Complaint  Patient presents with  . Coronary Artery Disease    post hsopitalization, STEMI      History of Present Illness: Adam Bass is a 67 y.o. male who presents for post hospital for STEMI with thrombectomy and DES in [place].  EF 45-50% by cath.  On presentation he was hypotensive.     Other hx Lung cancer lt lung treated with radiation in 2/19, COPD, ETOH, + tobacco.   HLD and LFT elevation.   EF on cath 55-60%    Not on statin due to elevated lipids.    No chest pain, he does have some dyspnea at night.  He repositions and it improves. He has decreased his tobacco to half a pk per day, he is living with his daughter and he does not smoke in the house there.  His ETOH he has decreased.  We discussed stopping both tobacco and ETOH.    He is on disability at least through [employer] per PCP.  He is walking some for exercise.   We discussed cardiac rehab and he is interested in attending.    He has some tingling of face, arm side and leg.  He has had this in the past and no cause was found.  I have asked him to see PCP.  If symptoms increase to go to ER.   Past Medical History:  Diagnosis Date  . COPD (chronic obstructive pulmonary disease) ([place])    for 10 years or more   . History of radiation therapy 06/03/17- 06/09/17   Left Lung treated to 54 Gy with 3 fx of 18 Gy. SBRT  . Lung cancer ([place])   . Myocardial infarction ([place])   . Skin cancer of face 07/2015   skin cancer removed from face by Dr. Tarri Glenn in [place]    Past Surgical History:  Procedure Laterality Date  . COLONOSCOPY  2004   Dr. Truett Perna  . COLONOSCOPY N/A 08/27/2012   Procedure: COLONOSCOPY;  Surgeon: Rogene Houston, MD;  [facility];  Service: Endoscopy;  Laterality: N/A;  1200  . CORONARY STENT  INTERVENTION N/A 11/23/2017   Procedure: CORONARY STENT INTERVENTION;  Surgeon: Jettie Booze, MD;  Location: [place] [facility];  Service: Cardiovascular;  Laterality: N/A;  . LEFT HEART CATH AND CORONARY ANGIOGRAPHY N/A 11/23/2017   Procedure: LEFT HEART CATH AND CORONARY ANGIOGRAPHY;  Surgeon: Jettie Booze, MD;  Location: [place] [facility];  Service: Cardiovascular;  Laterality: N/A;     Current Outpatient Medications  Medication Sig Dispense Refill  . albuterol (PROAIR HFA) 108 (90 BASE) MCG/ACT inhaler Inhale 2 puffs into the lungs every 6 (six) hours as needed for wheezing.    Marland Kitchen aspirin EC 81 MG tablet Take 81 mg by mouth daily.    . bisoprolol (ZEBETA) 5 MG tablet Take 0.5 tablets (2.5 mg total) by mouth daily. 60 tablet 1  . Blood Pressure KIT 1 Units by Does not apply route daily. 1 each 0  . ezetimibe (ZETIA) 10 MG tablet Take 1 tablet (10 mg total) by mouth daily. 90 tablet 1  . fluticasone (FLONASE) 50 MCG/ACT nasal spray Place 1 spray into both nostrils daily.    . Fluticasone-Umeclidin-Vilant (TRELEGY ELLIPTA) 100-62.5-25 MCG/INH AEPB Inhale 1 puff into  the lungs daily. 1 each 3  . HYDROMET 5-1.5 MG/5ML syrup Take 5 mLs by mouth 4 (four) times daily as needed for cough.   0  . LORazepam (ATIVAN) 0.5 MG tablet Take 1 tablet (0.5 mg total) by mouth every 6 (six) hours as needed for anxiety. (Patient not taking: Reported on 09/12/2017) 25 tablet 0  . Multiple Vitamins-Minerals (MULTIVITAMIN WITH MINERALS) tablet Take 1 tablet by mouth daily.    Marland Kitchen POTASSIUM PO Take 1 tablet by mouth daily.    . Tetrahydrozoline HCl (VISINE OP) Place 1 drop into both eyes daily as needed ([place]ry Eyes).    . ticagrelor (BRILINTA) 90 MG TABS tablet Take 1 tablet (90 mg total) by mouth 2 (two) times daily. 180 tablet 2  . VITAMIN E PO Take 1 tablet by mouth daily.     No current facility-administered medications for this visit.     Allergies:   Bee venom    Social History:  The patient   reports that he has been smoking cigars. He has a 37.50 pack-year smoking history. He has never used smokeless tobacco. He reports that he drinks alcohol. He reports that he does not use drugs.   Family History:  The patient's family history includes Breast cancer in his sister.    ROS:  General:no colds or fevers, no weight changes Skin:no rashes or ulcers HEENT:no blurred vision, no congestion CV:see HPI PUL:see HPI GI:no diarrhea constipation or melena, no indigestion GU:no hematuria, no dysuria MS:no joint pain, no claudication Neuro:no syncope, no lightheadedness Endo:no diabetes, no thyroid disease  Wt Readings from Last 3 Encounters:  12/09/17 147 lb (66.7 kg)  12/05/17 147 lb 3.2 oz (66.8 kg)  11/27/17 143 lb 8 oz (65.1 kg)     PHYSICAL EXAM: VS:  BP 124/64   Pulse 61   Ht '5\' 9"'$  (1.753 m)   Wt 147 lb (66.7 kg)   BMI 21.71 kg/m  , BMI Body mass index is 21.71 kg/m. General:Pleasant affect, NAD Skin:Warm and dry, brisk capillary refill HEENT:normocephalic, sclera clear, mucus membranes moist Neck:supple, no JVD, no bruits  Heart:S1S2 RRR without murmur, gallup, rub or click Lungs:clear without rales, rhonchi, or wheezes ZOX:WRUE, non tender, + BS, do not palpate liver spleen or masses Ext:no lower ext edema, 2+ pedal pulses, 2+ radial pulses Neuro:alert and oriented X 3, MAE, follows commands, + facial symmetry    EKG:  EKG is ordered today. The ekg ordered today demonstrates SR with Rt ward axis, incomplete RBBB improved from the hospital.    Recent Labs: 11/27/2017: ALT 96; BUN 13; Creatinine, Ser 0.68; Hemoglobin 13.7; Platelets 193; Potassium 4.0; Sodium 138    Lipid Panel    Component Value Date/Time   CHOL 140 11/24/2017 0024   TRIG 116 11/24/2017 0024   HDL 33 (L) 11/24/2017 0024   CHOLHDL 4.2 11/24/2017 0024   VLDL 23 11/24/2017 0024   LDLCALC 84 11/24/2017 0024       Other studies Reviewed: Additional studies/ records that were reviewed  today include:   Cardiac cath emergent 11/23/17.   Prox LAD lesion is 25% stenosed.  1st Mrg lesion is 25% stenosed.  Post Atrio lesion is 70% stenosed.  Dist RCA lesion is 25% stenosed.  Mid RCA lesion is 100% stenosed.  After percutaneous thrombectomy, a drug-eluting stent was successfully placed using a STENT SYNERGY DES 3X28.  Post intervention, there is a 0% residual stenosis.  The left ventricular systolic function is normal.  LV end diastolic  pressure is moderately elevated. LVEDP 27 mm Hg.  The left ventricular ejection fraction is 45-50% by visual estimate.  There is no aortic valve stenosis.   Recommend uninterrupted dual antiplatelet therapy with Aspirin 75m daily and Ticagrelor 98mtwice daily for a minimum of 12 months (ACS - Class I recommendation).   If antiplatelet therapy needed to be stopped earlier due to need for other invasive procedure, this could be discussed since a Synergy stent was placed.   Watch in ICU.  Continue tirofiban for a few more hours.  No beta blocker due to some bradycardia.  He will need statin as well.   BP responded to half amp of atropine.  IV fluid limited by increased LVEDP.    Echo 11/24/17 Study Conclusions  - Left ventricle: Cannot fully evaluate regional wall motion as   endocardium is not well seen in some views The basal nferior wall   does appear hypokinetic. OVerall LVEF is normal. The cavity size   was normal. Wall thickness was normal. Systolic function was   normal. The estimated ejection fraction was in the range of 55%   to 60%. - Right ventricle: The cavity size was mildly dilated.   ASSESSMENT AND PLAN:  1.  S/p STEMI 11/23/17 with thrombectomy and stent to mRCA.  DES, continue ASA and Brilinta.  Will follow up in [place]  [facility]  CAD -  Minimal residual disease.  On zetia, continue, check LFTs today if improved add statin.  3.  Tingling  In face, arm abd and leg -  follow up with PCP, he has had in past with no known cause.  If increases he will go to ER   4.  HLD on zetia, will need recheck in Oct.  Hope to add statin if LFTs are decreased.  5.  Lung cancer with radiation therapy.  Stable. On disability.   6.  Tobacco use down to half a pk.  Encouraged to stop.  7.   ETOH use, discussed it can affect liver, no liquor since discharge has had few beers, will check LFTs today.     Current medicines are reviewed with the patient today.  The patient Has no concerns regarding medicines.  The following changes have been made:  See above Labs/ tests ordered today include:see above  Disposition:   FU:  see above  Signed, LaCecilie KicksNP  12/09/2017 12:23 PM    [facility] [address]: [phone]: [phone]

## 2017-12-09 NOTE — Patient Instructions (Signed)
Medication Instructions:  Your physician recommends that you continue on your current medications as directed. Please refer to the Current Medication list given to you today.  Labwork: You will have labs drawn today: BMP and LFT's   Testing/Procedures: None ordered.  Follow-Up: Your physician recommends that you schedule a follow-up appointment in: In October with a MD (first available) in our [place] office.    Any Other Special Instructions Will Be Listed Below (If Applicable).     If you need a refill on your cardiac medications before your next appointment, please call your pharmacy.

## 2017-12-10 ENCOUNTER — Telehealth: Payer: Self-pay | Admitting: Cardiovascular Disease

## 2017-12-10 LAB — BASIC METABOLIC PANEL
BUN / CREAT RATIO: 18 (ref 10–24)
BUN: 16 mg/dL (ref 8–27)
CHLORIDE: 101 mmol/L (ref 96–106)
CO2: 23 mmol/L (ref 20–29)
Calcium: 9 mg/dL (ref 8.6–10.2)
Creatinine, Ser: 0.91 mg/dL (ref 0.76–1.27)
GFR calc Af Amer: 100 mL/min/{1.73_m2} (ref >59)
GFR calc non Af Amer: 87 mL/min/{1.73_m2} (ref >59)
GLUCOSE: 96 mg/dL (ref 65–99)
POTASSIUM: 4.7 mmol/L (ref 3.5–5.2)
Sodium: 139 mmol/L (ref 134–144)

## 2017-12-10 LAB — HEPATIC FUNCTION PANEL
ALT: 121 IU/L — AB (ref 0–44)
AST: 92 IU/L — ABNORMAL HIGH (ref 0–40)
Albumin: 4 g/dL (ref 3.6–4.8)
Alkaline Phosphatase: 102 IU/L (ref 39–117)
BILIRUBIN, DIRECT: 0.27 mg/dL (ref 0.00–0.40)
Bilirubin Total: 0.7 mg/dL (ref 0.0–1.2)
Total Protein: 7.2 g/dL (ref 6.0–8.5)

## 2017-12-10 NOTE — Telephone Encounter (Signed)
New Message:    Pt discharged on 11-27-17. Please call,question about his medicine.

## 2017-12-10 NOTE — Telephone Encounter (Signed)
Pt called to ask if he should have nitro at home.. It was mentioned to him by a nurse at the hospital when he had his MI but not listed on his discharge meds. I advised him that I will forward to Cecilie Kicks NP since she saw him yesterday and see if she would like to add it to his med regimen and let him know for sure.Pt agrees and will wait for a call back.

## 2017-12-11 ENCOUNTER — Telehealth: Payer: Self-pay | Admitting: Cardiology

## 2017-12-11 DIAGNOSIS — Z79899 Other long term (current) drug therapy: Secondary | ICD-10-CM

## 2017-12-11 MED ORDER — NITROGLYCERIN 0.4 MG SL SUBL: 0.4000 mg | SUBLINGUAL_TABLET | SUBLINGUAL | 3 refills | Status: DC | PRN

## 2017-12-11 NOTE — Telephone Encounter (Signed)
New message   Patient is calling to get lab results.

## 2017-12-11 NOTE — Telephone Encounter (Signed)
-----   Message from [name], NP sent at 12/11/2017  8:29 AM EDT ----- Labs are improving but not low enough for statin, continued with very decreased alcohol and recheck hepatic in 3 weeks.  Thanks.

## 2017-12-11 NOTE — Telephone Encounter (Signed)
Yes he should, should have had upon discharge, sorry for delay.  NTG SL Take 1 NTG, under your tongue, while sitting.  If no relief of pain may repeat NTG, one tab every 5 minutes up to 3 tablets total over 15 minutes.  If no relief CALL 911.  If you have dizziness/lightheadness  while taking NTG, stop taking and call 911.

## 2017-12-11 NOTE — Telephone Encounter (Signed)
Pt aware of Cecilie Kicks, NP response and recommendation.  [name], NP     8:02 AM  Note    Yes he should, should have had upon discharge, sorry for delay.  NTG SL Take 1 NTG, under your tongue, while sitting.  If no relief of pain may repeat NTG, one tab every 5 minutes up to 3 tablets total over 15 minutes.  If no relief CALL 911.  If you have dizziness/lightheadness  while taking NTG, stop taking and call 911.           Rx for Nitro-glycerin sent to the patient's pharmacy. Pt educated on how and when to use Nitro and when to call 911.  Pt verbalized understanding to the instruction given and voiced appreciation for the call.

## 2017-12-24 ENCOUNTER — Other Ambulatory Visit: Payer: Self-pay | Admitting: Internal Medicine

## 2018-01-01 ENCOUNTER — Encounter (INDEPENDENT_AMBULATORY_CARE_PROVIDER_SITE_OTHER): Payer: Self-pay

## 2018-01-01 ENCOUNTER — Other Ambulatory Visit: Payer: BLUE CROSS/BLUE SHIELD | Admitting: *Deleted

## 2018-01-01 DIAGNOSIS — Z79899 Other long term (current) drug therapy: Secondary | ICD-10-CM | POA: Diagnosis not present

## 2018-01-01 LAB — HEPATIC FUNCTION PANEL
ALT: 91 IU/L — AB (ref 0–44)
AST: 68 IU/L — ABNORMAL HIGH (ref 0–40)
Albumin: 3.9 g/dL (ref 3.6–4.8)
Alkaline Phosphatase: 112 IU/L (ref 39–117)
BILIRUBIN, DIRECT: 0.18 mg/dL (ref 0.00–0.40)
Bilirubin Total: 0.4 mg/dL (ref 0.0–1.2)
TOTAL PROTEIN: 6.9 g/dL (ref 6.0–8.5)

## 2018-01-02 ENCOUNTER — Telehealth: Payer: Self-pay

## 2018-01-02 DIAGNOSIS — R7989 Other specified abnormal findings of blood chemistry: Secondary | ICD-10-CM

## 2018-01-02 DIAGNOSIS — R945 Abnormal results of liver function studies: Principal | ICD-10-CM

## 2018-01-02 NOTE — Telephone Encounter (Signed)
Spoke with patient about his lab results, he expressed understanding. Patient accepted a 10/25 lab appointment for the 5 week repeat LFTs

## 2018-01-02 NOTE — Telephone Encounter (Signed)
-----   Message from [name], NP sent at 01/02/2018 10:18 AM EDT ----- Liver function is improving recheck in 5 weeks.

## 2018-01-05 ENCOUNTER — Encounter (HOSPITAL_COMMUNITY): Payer: Self-pay

## 2018-01-05 ENCOUNTER — Encounter (HOSPITAL_COMMUNITY)
Admission: RE | Admit: 2018-01-05 | Discharge: 2018-01-05 | Disposition: A | Discharge status: Home / Self Care | Payer: BLUE CROSS/BLUE SHIELD | Source: Ambulatory Visit | Attending: Interventional Cardiology | Admitting: Interventional Cardiology

## 2018-01-05 VITALS — BP 110/60 | HR 64 | Ht 69.0 in | Wt 143.1 lb

## 2018-01-05 DIAGNOSIS — Z955 Presence of coronary angioplasty implant and graft: Secondary | ICD-10-CM

## 2018-01-05 DIAGNOSIS — I2119 ST elevation (STEMI) myocardial infarction involving other coronary artery of inferior wall: Secondary | ICD-10-CM | POA: Insufficient documentation

## 2018-01-05 NOTE — Progress Notes (Signed)
Cardiac Individual Treatment Plan  Patient Details  Name: Adam Bass MRN: [id number] Date of Birth: [birth year] Referring Provider:     CARDIAC REHAB PHASE II ORIENTATION from 01/05/2018 in [place]  Referring Provider  [name]      Initial Encounter Date:    CARDIAC REHAB PHASE II ORIENTATION from 01/05/2018 in [place]  Date  01/05/18      Visit Diagnosis: ST elevation myocardial infarction (STEMI) involving other coronary artery of inferior wall (HCC)  Status post coronary artery stent placement  Patient's Home Medications on Admission:  Current Outpatient Medications:  .  albuterol (PROAIR HFA) 108 (90 BASE) MCG/ACT inhaler, Inhale 2 puffs into the lungs every 6 (six) hours as needed for wheezing., Disp: , Rfl:  .  aspirin EC 81 MG tablet, Take 81 mg by mouth daily., Disp: , Rfl:  .  bisoprolol (ZEBETA) 5 MG tablet, Take 0.5 tablets (2.5 mg total) by mouth daily., Disp: 60 tablet, Rfl: 1 .  ezetimibe (ZETIA) 10 MG tablet, Take 1 tablet (10 mg total) by mouth daily., Disp: 90 tablet, Rfl: 1 .  fluticasone (FLONASE) 50 MCG/ACT nasal spray, Place 1 spray into both nostrils daily., Disp: , Rfl:  .  LORazepam (ATIVAN) 0.5 MG tablet, Take 1 tablet (0.5 mg total) by mouth every 6 (six) hours as needed for anxiety., Disp: 25 tablet, Rfl: 0 .  Multiple Vitamins-Minerals (MULTIVITAMIN WITH MINERALS) tablet, Take 1 tablet by mouth daily., Disp: , Rfl:  .  nitroGLYCERIN (NITROSTAT) 0.4 MG SL tablet, Place 1 tablet (0.4 mg total) under the tongue every 5 (five) minutes as needed., Disp: 25 tablet, Rfl: 3 .  Potassium 99 MG TABS, Take 1 tablet by mouth daily. , Disp: , Rfl:  .  tamsulosin (FLOMAX) 0.4 MG CAPS capsule, Take 0.4 mg by mouth daily., Disp: , Rfl:  .  Tetrahydrozoline HCl (VISINE OP), Place 1 drop into both eyes daily as needed (Dry Eyes)., Disp: , Rfl:  .  ticagrelor (BRILINTA) 90 MG TABS tablet, Take 1 tablet (90 mg total)  by mouth 2 (two) times daily., Disp: 180 tablet, Rfl: 2 .  TRELEGY ELLIPTA 100-62.5-25 MCG/INH AEPB, INHALE 1 PUFF INTO THE LUNGS EVERY DAY, Disp: 60 each, Rfl: 11 .  vitamin E 400 UNIT capsule, Take 1 tablet by mouth daily. , Disp: , Rfl:  .  Blood Pressure KIT, 1 Units by Does not apply route daily., Disp: 1 each, Rfl: 0 .  HYDROMET 5-1.5 MG/5ML syrup, Take 5 mLs by mouth 4 (four) times daily as needed for cough. , Disp: , Rfl: 0  Past Medical History: Past Medical History:  Diagnosis Date  . COPD (chronic obstructive pulmonary disease) ([place])    for 10 years or more   . History of radiation therapy 06/03/17- 06/09/17   Left Lung treated to 54 Gy with 3 fx of 18 Gy. SBRT  . Lung cancer ([place])   . Myocardial infarction ([place])   . Skin cancer of face 07/2015   skin cancer removed from face by Dr. Tarri Glenn in [place]    Tobacco Use: Social History   Tobacco Use  Smoking Status Current Every Day Smoker  . Packs/day: 0.75  . Years: 50.00  . Pack years: 37.50  . Types: Cigars  Smokeless Tobacco Never Used  Tobacco Comment   he is wearing a patch. He is smoking about 5-10 cigarettes daily.     Labs: Recent Review [employer] for  [facility] Cardiac and Pulmonary Rehab Latest Ref Rng & Units 05/06/2017 11/23/2017 11/24/2017   Cholestrol 0 - 200 mg/dL - 146 140   LDLCALC 0 - 99 mg/dL - 100(H) 84   HDL >40 mg/dL - 35(L) 33(L)   Trlycerides <150 mg/dL - 53 116   Hemoglobin A1c 4.8 - 5.6 % - - 5.0   PHART 7.350 - 7.450 7.444 - -   PCO2ART 32.0 - 48.0 mmHg 38.9 - -   HCO3 20.0 - 28.0 mmol/L 26.7 - -   TCO2 22 - 32 mmol/L - 23 -   O2SAT % 95.9 - -      Capillary Blood Glucose: Lab Results  Component Value Date   GLUCAP 104 (H) 04/02/2017   GLUCAP 112 (H) 10/02/2016     Exercise Target Goals: Exercise Program Goal: Individual exercise prescription set using results from initial 6 min walk test and THRR while considering  patient's activity barriers and safety.   Exercise  Prescription Goal: Starting with aerobic activity 30 plus minutes a day, 3 days per week for initial exercise prescription. Provide home exercise prescription and guidelines that participant acknowledges understanding prior to discharge.  Activity Barriers & Risk Stratification: Activity Barriers & Cardiac Risk Stratification - 01/05/18 1029      Activity Barriers & Cardiac Risk Stratification   Activity Barriers  Joint Problems;Deconditioning;Muscular Weakness;Shortness of Breath    Cardiac Risk Stratification  High       6 Minute Walk: 6 Minute Walk    Row Name 01/05/18 1028         6 Minute Walk   Phase  Initial     Distance  1000 feet     Walk Time  6 minutes     # of Rest Breaks  0     MPH  1.89     METS  2.45     RPE  13     Perceived Dyspnea   13     VO2 Peak  10.25     Symptoms  Yes (comment)     Comments  4/10 bilateral hip pain      Resting HR  64 bpm     Resting BP  110/60     Resting Oxygen Saturation   95 %     Exercise Oxygen Saturation  during 6 min walk  92 %     Max Ex. HR  91 bpm     Max Ex. BP  138/68     2 Minute Post BP  114/66        Oxygen Initial Assessment:   Oxygen Re-Evaluation:   Oxygen Discharge (Final Oxygen Re-Evaluation):   Initial Exercise Prescription: Initial Exercise Prescription - 01/05/18 1000      Date of Initial Exercise RX and Referring Provider   Date  01/05/18    Referring Provider  [name]    Expected Discharge Date  04/06/18      NuStep   Level  1    SPM  55    Minutes  17    METs  1.9      Arm Ergometer   Level  1.5    Watts  11    RPM  41    Minutes  17    METs  1.7      Prescription Details   Frequency (times per week)  3    Duration  Progress to 30 minutes of continuous aerobic without signs/symptoms of physical distress  Intensity   THRR 40-80% of Max Heartrate  [phone]    Ratings of Perceived Exertion  11-13    Perceived Dyspnea  0-4      Progression   Progression  Continue  progressive overload as per policy without signs/symptoms or physical distress.      Resistance Training   Training Prescription  Yes    Weight  1    Reps  10-15       Perform Capillary Blood Glucose checks as needed.  Exercise Prescription Changes:  Exercise Prescription Changes    Row Name 01/05/18 1000             Home Exercise Plan   Plans to continue exercise at  Home (comment) walking        Frequency  Add 2 additional days to program exercise sessions.       Initial Home Exercises Provided  01/05/18          Exercise Comments:   Exercise Goals and Review:  Exercise Goals    Row Name 01/05/18 1041             Exercise Goals   Increase Physical Activity  Yes       Intervention  Provide advice, education, support and counseling about physical activity/exercise needs.;Develop an individualized exercise prescription for aerobic and resistive training based on initial evaluation findings, risk stratification, comorbidities and participant's personal goals.       Expected Outcomes  Short Term: Attend rehab on a regular basis to increase amount of physical activity.       Increase Strength and Stamina  Yes       Intervention  Provide advice, education, support and counseling about physical activity/exercise needs.;Develop an individualized exercise prescription for aerobic and resistive training based on initial evaluation findings, risk stratification, comorbidities and participant's personal goals.       Expected Outcomes  Short Term: Increase workloads from initial exercise prescription for resistance, speed, and METs.;Short Term: Perform resistance training exercises routinely during rehab and add in resistance training at home;Long Term: Improve cardiorespiratory fitness, muscular endurance and strength as measured by increased METs and functional capacity (6MWT)       Able to understand and use rate of perceived exertion (RPE) scale  Yes       Intervention  Provide  education and explanation on how to use RPE scale       Expected Outcomes  Short Term: Able to use RPE daily in rehab to express subjective intensity level;Long Term:  Able to use RPE to guide intensity level when exercising independently       Able to understand and use Dyspnea scale  Yes       Intervention  Provide education and explanation on how to use Dyspnea scale       Expected Outcomes  Short Term: Able to use Dyspnea scale daily in rehab to express subjective sense of shortness of breath during exertion;Long Term: Able to use Dyspnea scale to guide intensity level when exercising independently       Knowledge and understanding of Target Heart Rate Range (THRR)  Yes       Intervention  Provide education and explanation of THRR including how the numbers were predicted and where they are located for reference       Expected Outcomes  Short Term: Able to state/look up THRR;Long Term: Able to use THRR to govern intensity when exercising independently;Short Term: Able to use daily as guideline for intensity  in rehab       Able to check pulse independently  Yes       Intervention  Provide education and demonstration on how to check pulse in carotid and radial arteries.;Review the importance of being able to check your own pulse for safety during independent exercise       Expected Outcomes  Short Term: Able to explain why pulse checking is important during independent exercise;Long Term: Able to check pulse independently and accurately       Understanding of Exercise Prescription  Yes       Intervention  Provide education, explanation, and written materials on patient's individual exercise prescription       Expected Outcomes  Short Term: Able to explain program exercise prescription;Long Term: Able to explain home exercise prescription to exercise independently          Exercise Goals Re-Evaluation :    Discharge Exercise Prescription (Final Exercise Prescription Changes): Exercise  Prescription Changes - 01/05/18 1000      Home Exercise Plan   Plans to continue exercise at  Home (comment)   walking    Frequency  Add 2 additional days to program exercise sessions.    Initial Home Exercises Provided  01/05/18       Nutrition:  Target Goals: Understanding of nutrition guidelines, daily intake of sodium <1576m, cholesterol <2063m calories 30% from fat and 7% or less from saturated fats, daily to have 5 or more servings of fruits and vegetables.  Biometrics: Pre Biometrics - 01/05/18 1041      Pre Biometrics   Height  _0  (1.753 m)    Weight  64.9 kg    Waist Circumference  30.25 inches    Hip Circumference  32.5 inches    Waist to Hip Ratio  0.93 %    BMI (Calculated)  21.12    Triceps Skinfold  3 mm    % Body Fat  14.2 %    Grip Strength  19.4 kg    Flexibility  16.3 in    Single Leg Stand  5 seconds        Nutrition Therapy Plan and Nutrition Goals: Nutrition Therapy & Goals - 01/05/18 0943      Personal Nutrition Goals   Comments  Patient scored a 50 on his medficts assessment. He eats out a lot and would like to learn more about making healthy choices when eating out and improving his diet in general.        Nutrition Assessments: Nutrition Assessments - 01/05/18 0942      MEDFICTS Scores   Pre Score  50       Nutrition Goals Re-Evaluation:   Nutrition Goals Discharge (Final Nutrition Goals Re-Evaluation):   Psychosocial: Target Goals: Acknowledge presence or absence of significant depression and/or stress, maximize coping skills, provide positive support system. Participant is able to verbalize types and ability to use techniques and skills needed for reducing stress and depression.  Initial Review & Psychosocial Screening: Initial Psych Review & Screening - 01/05/18 1032      Initial Review   Current issues with  None Identified      Family Dynamics   Good Support System?  Yes    Comments  Patient has good family support  from his daughters and his ex-wife and from friends.       Barriers   Psychosocial barriers to participate in program  There are no identifiable barriers or psychosocial needs.  Screening Interventions   Interventions  Encouraged to exercise;Provide feedback about the scores to participant    Expected Outcomes  Long Term goal: The participant improves quality of Life and PHQ9 Scores as seen by post scores and/or verbalization of changes       Quality of Life Scores: Quality of Life - 01/05/18 1043      Quality of Life   Select  Quality of Life      Quality of Life Scores   Health/Function Pre  15.53 %    Socioeconomic Pre  24 %    Psych/Spiritual Pre  24.86 %    Family Pre  27 %    GLOBAL Pre  20.7 %      Scores of 19 and below usually indicate a poorer quality of life in these areas.  A difference of  2-3 points is a clinically meaningful difference.  A difference of 2-3 points in the total score of the Quality of Life Index has been associated with significant improvement in overall quality of life, self-image, physical symptoms, and general health in studies assessing change in quality of life.  PHQ-9: Recent Review Flowsheet Data    Depression screen [facility] 2/9 01/05/2018 09/12/2017 07/04/2017 04/30/2017   Decreased Interest 0 0 0 0   Down, Depressed, Hopeless 0 0 0 0   PHQ - 2 Score 0 0 0 0   Altered sleeping 1 - - -   Tired, decreased energy 1 - - -   Change in appetite 1 - - -   Feeling bad or failure about yourself  0 - - -   Trouble concentrating 0 - - -   Moving slowly or fidgety/restless 1 - - -   Suicidal thoughts 0 - - -   PHQ-9 Score 4 - - -   Difficult doing work/chores Somewhat difficult - - -     Interpretation of Total Score  Total Score Depression Severity:  1-4 = Minimal depression, 5-9 = Mild depression, 10-14 = Moderate depression, 15-19 = Moderately severe depression, 20-27 = Severe depression   Psychosocial Evaluation and  Intervention: Psychosocial Evaluation - 01/05/18 1034      Psychosocial Evaluation & Interventions   Interventions  Encouraged to exercise with the program and follow exercise prescription;Stress management education;Relaxation education    Comments  Patient's initial QOL score was 20.70 and his PHQ_9 score was 4 with no psychosocial issues identified. He does have difficulty falling asleep since his procedure. His PCP prescribed Lorazepam 0.5 mg as needed for sleep.     Expected Outcomes  Patient will have no psychosocial issues identified at discharge. His QOL and PHQ-9 scores will improve at discharge.     Continue Psychosocial Services   No Follow up required       Psychosocial Re-Evaluation:   Psychosocial Discharge (Final Psychosocial Re-Evaluation):   Vocational Rehabilitation: Provide vocational rehab assistance to qualifying candidates.   Vocational Rehab Evaluation & Intervention: Vocational Rehab - 01/05/18 0943      Initial Vocational Rehab Evaluation & Intervention   Assessment shows need for Vocational Rehabilitation  No       Education: Education Goals: Education classes will be provided on a weekly basis, covering required topics. Participant will state understanding/return demonstration of topics presented.  Learning Barriers/Preferences: Learning Barriers/Preferences - 01/05/18 0944      Learning Barriers/Preferences   Learning Barriers  None    Learning Preferences  Written Material;Verbal Instruction;Skilled Demonstration       Education Topics:  Hypertension, Hypertension Reduction -Define heart disease and high blood pressure. Discus how high blood pressure affects the body and ways to reduce high blood pressure.   Exercise and Your Heart -Discuss why it is important to exercise, the FITT principles of exercise, normal and abnormal responses to exercise, and how to exercise safely.   Angina -Discuss definition of angina, causes of angina,  treatment of angina, and how to decrease risk of having angina.   Cardiac Medications -Review what the following cardiac medications are used for, how they affect the body, and side effects that may occur when taking the medications.  Medications include Aspirin, Beta blockers, calcium channel blockers, ACE Inhibitors, angiotensin receptor blockers, diuretics, digoxin, and antihyperlipidemics.   Congestive Heart Failure -Discuss the definition of CHF, how to live with CHF, the signs and symptoms of CHF, and how keep track of weight and sodium intake.   Heart Disease and Intimacy -Discus the effect sexual activity has on the heart, how changes occur during intimacy as we age, and safety during sexual activity.   Smoking Cessation / COPD -Discuss different methods to quit smoking, the health benefits of quitting smoking, and the definition of COPD.   Nutrition I: Fats -Discuss the types of cholesterol, what cholesterol does to the heart, and how cholesterol levels can be controlled.   Nutrition II: Labels -Discuss the different components of food labels and how to read food label   Heart Parts/Heart Disease and PAD -Discuss the anatomy of the heart, the pathway of blood circulation through the heart, and these are affected by heart disease.   Stress I: Signs and Symptoms -Discuss the causes of stress, how stress may lead to anxiety and depression, and ways to limit stress.   Stress II: Relaxation -Discuss different types of relaxation techniques to limit stress.   Warning Signs of Stroke / TIA -Discuss definition of a stroke, what the signs and symptoms are of a stroke, and how to identify when someone is having stroke.   Knowledge Questionnaire Score: Knowledge Questionnaire Score - 01/05/18 0942      Knowledge Questionnaire Score   Pre Score  25/28       Core Components/Risk Factors/Patient Goals at Admission: Personal Goals and Risk Factors at Admission - 01/05/18  0944      Core Components/Risk Factors/Patient Goals on Admission    Weight Management  Weight Maintenance    Tobacco Cessation  Yes    Intervention  Assist the participant in steps to quit. Provide individualized education and counseling about committing to Tobacco Cessation, relapse prevention, and pharmacological support that can be provided by physician.;Advice worker, assist with locating and accessing local/national Quit Smoking programs, and support quit date choice.    Expected Outcomes  Short Term: Will demonstrate readiness to quit, by selecting a quit date.    Personal Goal Other  Yes    Personal Goal  Learn more about heart heatlhy eating; increase strength; get strong enough to get back to work. Quit smoking or reduce his smoking.     Intervention  Patient will attend CR 3 days/week and supplement with exercise 2 days/week at home by walking.     Expected Outcomes  Patient will meet his personal goals.        Core Components/Risk Factors/Patient Goals Review:    Core Components/Risk Factors/Patient Goals at Discharge (Final Review):    ITP Comments: ITP Comments    Row Name 01/05/18 [phone]  ITP Comments  Patient is a 67 yr old male referred by Dr. 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 He is currenlty smoking 3/4 ppd. He is interested iin quiting but not actively trying to quit. He has no barriers to CR. He has COPD treated with inhalers.           Comments: Patient arrived for 1st visit/orientation/education at 0800. Patient was referred to CR by Dr. [name] due to STEMI involving other coronary artery of inferior wall (I21.19) and S/P Stent Placement (Z95.5). During orientation advised patient on arrival and appointment times what to wear, what to do before, during and after exercise. Reviewed attendance and class policy. Talked about inclement weather and class consultation policy. Pt is scheduled to return Cardiac Rehab on 01/07/2018 at  9:30. Pt was advised to come to class 15 minutes before class starts. Patient was also given instructions on meeting with the dietician and attending the Family Structure classes. Discussed RPE/Dpysnea scales. Discussed initial THR and how to find their radial and/or carotid pulse. Discussed the initial exercise prescription and how this effects their progress. Pt is eager to get started. Patient participated in warm up stretches followed by light weights and resistance bands. Patient was able to complete 6 minute walk test. Patient c/o bilateral hip pain during walk test 4/10. Pain subsided to 0/10 at 2 minute rest. Patient was measured for the equipment. Discussed equipment safety with patient. Took patient pre-anthropometric measurements. Patient finished visit at 1030.

## 2018-01-05 NOTE — Progress Notes (Signed)
Cardiac/Pulmonary Rehab Medication Review by a Pharmacist  Does the patient  feel that his/her medications are working for him/her?  yes  Has the patient been experiencing any side effects to the medications prescribed?  no  Does the patient measure his/her own blood pressure or blood glucose at home?  yes   Does the patient have any problems obtaining medications due to transportation or finances?   no  Understanding of regimen: good Understanding of indications: good Potential of compliance: excellent  Questions asked to Determine Patient Understanding of Medication Regimen:  1. What is the name of the medication?  2. What is the medication used for?  3. When should it be taken?  4. How much should be taken?  5. How will you take it?  6. What side effects should you report?  Understanding Defined as: Excellent: All questions above are correct Good: Questions 1-4 are correct Fair: Questions 1-2 are correct  Poor: 1 or none of the above questions are correct   Pharmacist comments: Pt stated he's had trouble sleeping since his MI. Educated him in regards to  Not using his albuterol MDI close to bedtime. Pt takes lorazepam for sleep, but he feels "hung over."  Advised pt to ask physician about using an OTC sleep aid such as diphenhydramine for sleep.   Despina Pole, Pharm. D. Clinical Pharmacist 01/05/2018 12:02 PM

## 2018-01-05 NOTE — Progress Notes (Signed)
Daily Session Note  Patient Details  Name: Adam Bass MRN: [id number] Date of Birth: [birth year] Referring Provider:    Encounter Date: 01/05/2018  Check In: Session Check In - 01/05/18 0800      Check-In   Supervising physician immediately available to respond to emergencies  See telemetry face sheet for immediately available ER MD    Location  [facility]    Staff Present  Russella Dar, MS, EP, [facility], Exercise Physiologist;Amanda Ballard, Exercise Physiologist;Rondi Ivy Wynetta Emery, RN, BSN    Medication changes reported      No    Fall or balance concerns reported     No    Tobacco Cessation  --   Patient smokes 3/4 ppd. He is interested in [place] but is not actively trying to quit presently.    Warm-up and Cool-down  Performed as group-led Higher education careers adviser Performed  Yes    VAD Patient?  No    PAD/SET Patient?  No      Pain Assessment   Currently in Pain?  No/denies    Multiple Pain Sites  No       Capillary Blood Glucose: No results found for this or any previous visit (from the past 24 hour(s)).    Social History   Tobacco Use  Smoking Status Current Every Day Smoker  . Packs/day: 0.75  . Years: 50.00  . Pack years: 37.50  . Types: Cigars  Smokeless Tobacco Never Used  Tobacco Comment   he is wearing a patch. He is smoking about 5-10 cigarettes daily.     Goals Met:  No report of cardiac concerns or symptoms Strength training completed today  Goals Unmet:  Not Applicable  Comments: Check out 1030.   Dr. Kate Sable is Medical Director for [facility] Cardiac and Pulmonary Rehab.

## 2018-01-07 ENCOUNTER — Encounter (HOSPITAL_COMMUNITY)
Admission: RE | Admit: 2018-01-07 | Discharge: 2018-01-07 | Disposition: A | Discharge status: Home / Self Care | Payer: BLUE CROSS/BLUE SHIELD | Source: Ambulatory Visit | Attending: Interventional Cardiology | Admitting: Interventional Cardiology

## 2018-01-07 DIAGNOSIS — Z955 Presence of coronary angioplasty implant and graft: Secondary | ICD-10-CM

## 2018-01-07 DIAGNOSIS — I2119 ST elevation (STEMI) myocardial infarction involving other coronary artery of inferior wall: Secondary | ICD-10-CM | POA: Diagnosis not present

## 2018-01-07 NOTE — Progress Notes (Signed)
Daily Session Note  Patient Details  Name: Adam Bass MRN: [id number] Date of Birth: Oct 10, 1950 Referring Provider:     CARDIAC REHAB PHASE II ORIENTATION from 01/05/2018 in [place]  Referring Provider  [name]      Encounter Date: 01/07/2018  Check In: Session Check In - 01/07/18 0930      Check-In   Supervising physician immediately available to respond to emergencies  See telemetry face sheet for immediately available ER MD    Location  [facility]    Staff Present  Russella Dar, MS, EP, [facility], Exercise Physiologist;Amanda Ballard, Exercise Physiologist;Savanna Dooley Wynetta Emery, RN, BSN    Medication changes reported      No    Fall or balance concerns reported     No    Tobacco Cessation  No Change    Warm-up and Cool-down  Performed as group-led instruction    Resistance Training Performed  Yes    VAD Patient?  No    PAD/SET Patient?  No      Pain Assessment   Currently in Pain?  No/denies    Pain Score  0-No pain    Multiple Pain Sites  No       Capillary Blood Glucose: No results found for this or any previous visit (from the past 24 hour(s)).    Social History   Tobacco Use  Smoking Status Current Every Day Smoker  . Packs/day: 0.75  . Years: 50.00  . Pack years: 37.50  . Types: Cigars  Smokeless Tobacco Never Used  Tobacco Comment   he is wearing a patch. He is smoking about 5-10 cigarettes daily.     Goals Met:  Independence with exercise equipment Exercise tolerated well No report of cardiac concerns or symptoms Strength training completed today  Goals Unmet:  Not Applicable  Comments: Pt able to follow exercise prescription today without complaint.  Will continue to monitor for progression. Check out 1030.   Dr. Kate Sable is Medical Director for [facility] Cardiac and Pulmonary Rehab.

## 2018-01-09 ENCOUNTER — Encounter (HOSPITAL_COMMUNITY)
Admission: RE | Admit: 2018-01-09 | Discharge: 2018-01-09 | Disposition: A | Discharge status: Home / Self Care | Payer: BLUE CROSS/BLUE SHIELD | Source: Ambulatory Visit | Attending: Interventional Cardiology | Admitting: Interventional Cardiology

## 2018-01-09 DIAGNOSIS — I2119 ST elevation (STEMI) myocardial infarction involving other coronary artery of inferior wall: Secondary | ICD-10-CM

## 2018-01-09 DIAGNOSIS — Z955 Presence of coronary angioplasty implant and graft: Secondary | ICD-10-CM | POA: Diagnosis not present

## 2018-01-09 NOTE — Progress Notes (Signed)
Daily Session Note  Patient Details  Name: Adam Bass MRN: [id number] Date of Birth: 07/15/1950 Referring Provider:     CARDIAC REHAB PHASE II ORIENTATION from 01/05/2018 in [place]  Referring Provider  [name]      Encounter Date: 01/09/2018  Check In: Session Check In - 01/09/18 0930      Check-In   Supervising physician immediately available to respond to emergencies  See telemetry face sheet for immediately available ER MD    Location  [facility]    Staff Present  Russella Dar, MS, EP, [facility], Exercise Physiologist;Amanda Ballard, Exercise Physiologist;Jaterrius Ricketson Wynetta Emery, RN, BSN    Medication changes reported      No    Fall or balance concerns reported     No    Warm-up and Cool-down  Performed as group-led instruction    Resistance Training Performed  Yes    VAD Patient?  No    PAD/SET Patient?  No      Pain Assessment   Currently in Pain?  No/denies    Pain Score  0-No pain    Multiple Pain Sites  No       Capillary Blood Glucose: No results found for this or any previous visit (from the past 24 hour(s)).    Social History   Tobacco Use  Smoking Status Current Every Day Smoker  . Packs/day: 0.75  . Years: 50.00  . Pack years: 37.50  . Types: Cigars  Smokeless Tobacco Never Used  Tobacco Comment   he is wearing a patch. He is smoking about 5-10 cigarettes daily.     Goals Met:  Independence with exercise equipment Exercise tolerated well No report of cardiac concerns or symptoms Strength training completed today  Goals Unmet:  Not Applicable  Comments: Pt able to follow exercise prescription today without complaint.  Will continue to monitor for progression. Check out 1030.   Dr. Kate Sable is Medical Director for [facility] Cardiac and Pulmonary Rehab.

## 2018-01-12 ENCOUNTER — Encounter (HOSPITAL_COMMUNITY): Payer: BLUE CROSS/BLUE SHIELD

## 2018-01-14 ENCOUNTER — Encounter (HOSPITAL_COMMUNITY)
Admission: RE | Admit: 2018-01-14 | Discharge: 2018-01-14 | Disposition: A | Discharge status: Home / Self Care | Payer: BLUE CROSS/BLUE SHIELD | Source: Ambulatory Visit | Attending: Interventional Cardiology | Admitting: Interventional Cardiology

## 2018-01-14 DIAGNOSIS — I2119 ST elevation (STEMI) myocardial infarction involving other coronary artery of inferior wall: Secondary | ICD-10-CM

## 2018-01-14 DIAGNOSIS — Z955 Presence of coronary angioplasty implant and graft: Secondary | ICD-10-CM | POA: Insufficient documentation

## 2018-01-14 NOTE — Progress Notes (Signed)
Daily Session Note  Patient Details  Name: Adam Bass MRN: [id number] Date of Birth: [birth year] Referring Provider:     CARDIAC REHAB PHASE II ORIENTATION from 01/05/2018 in [place]  Referring Provider  [name]      Encounter Date: 01/14/2018  Check In: Session Check In - 01/14/18 0930      Check-In   Supervising physician immediately available to respond to emergencies  See telemetry face sheet for immediately available ER MD    Location  [facility]    Staff Present  Benay Pike, Exercise Physiologist;Debra Wynetta Emery, RN, BSN    Medication changes reported      No    Fall or balance concerns reported     No    Tobacco Cessation  No Change    Warm-up and Cool-down  Performed as group-led instruction    Resistance Training Performed  Yes    VAD Patient?  No    PAD/SET Patient?  No      Pain Assessment   Currently in Pain?  No/denies    Pain Score  0-No pain    Multiple Pain Sites  No       Capillary Blood Glucose: No results found for this or any previous visit (from the past 24 hour(s)).    Social History   Tobacco Use  Smoking Status Current Every Day Smoker  . Packs/day: 0.75  . Years: 50.00  . Pack years: 37.50  . Types: Cigars  Smokeless Tobacco Never Used  Tobacco Comment   he is wearing a patch. He is smoking about 5-10 cigarettes daily.     Goals Met:  Independence with exercise equipment Exercise tolerated well No report of cardiac concerns or symptoms Strength training completed today  Goals Unmet:  Not Applicable  Comments: Pt able to follow exercise prescription today without complaint.  Will continue to monitor for progression. Check out 10:30.   Dr. Kate Sable is Medical Director for [facility] Cardiac and Pulmonary Rehab.

## 2018-01-16 ENCOUNTER — Encounter (HOSPITAL_COMMUNITY)
Admission: RE | Admit: 2018-01-16 | Discharge: 2018-01-16 | Disposition: A | Discharge status: Home / Self Care | Payer: BLUE CROSS/BLUE SHIELD | Source: Ambulatory Visit | Attending: Interventional Cardiology | Admitting: Interventional Cardiology

## 2018-01-16 DIAGNOSIS — Z955 Presence of coronary angioplasty implant and graft: Secondary | ICD-10-CM | POA: Diagnosis not present

## 2018-01-16 DIAGNOSIS — I2119 ST elevation (STEMI) myocardial infarction involving other coronary artery of inferior wall: Secondary | ICD-10-CM

## 2018-01-16 NOTE — Progress Notes (Signed)
Daily Session Note  Patient Details  Name: Adam Bass MRN: [id number] Date of Birth: November 11, 1950 Referring Provider:     CARDIAC REHAB PHASE II ORIENTATION from 01/05/2018 in [place]  Referring Provider  [name]      Encounter Date: 01/16/2018  Check In: Session Check In - 01/16/18 0930      Check-In   Supervising physician immediately available to respond to emergencies  See telemetry face sheet for immediately available ER MD    Location  [facility]    Staff Present  Benay Pike, Exercise Physiologist;Hildred Pharo Wynetta Emery, RN, BSN    Medication changes reported      No    Fall or balance concerns reported     No    Tobacco Cessation  No Change    Warm-up and Cool-down  Performed as group-led instruction    Resistance Training Performed  Yes    VAD Patient?  No    PAD/SET Patient?  No      Pain Assessment   Currently in Pain?  No/denies    Pain Score  0-No pain    Multiple Pain Sites  No       Capillary Blood Glucose: No results found for this or any previous visit (from the past 24 hour(s)).    Social History   Tobacco Use  Smoking Status Current Every Day Smoker  . Packs/day: 0.75  . Years: 50.00  . Pack years: 37.50  . Types: Cigars  Smokeless Tobacco Never Used  Tobacco Comment   he is wearing a patch. He is smoking about 5-10 cigarettes daily.     Goals Met:  Independence with exercise equipment Exercise tolerated well No report of cardiac concerns or symptoms Strength training completed today  Goals Unmet:  Not Applicable  Comments: Pt able to follow exercise prescription today without complaint.  Will continue to monitor for progression. Check out 1030.   Dr. Kate Sable is Medical Director for [facility] Cardiac and Pulmonary Rehab.

## 2018-01-19 ENCOUNTER — Encounter (HOSPITAL_COMMUNITY)
Admission: RE | Admit: 2018-01-19 | Discharge: 2018-01-19 | Disposition: A | Discharge status: Home / Self Care | Payer: BLUE CROSS/BLUE SHIELD | Source: Ambulatory Visit | Attending: Interventional Cardiology | Admitting: Interventional Cardiology

## 2018-01-19 DIAGNOSIS — Z955 Presence of coronary angioplasty implant and graft: Secondary | ICD-10-CM

## 2018-01-19 DIAGNOSIS — I2119 ST elevation (STEMI) myocardial infarction involving other coronary artery of inferior wall: Secondary | ICD-10-CM

## 2018-01-19 NOTE — Progress Notes (Signed)
Daily Session Note  Patient Details  Name: MIR FULLILOVE MRN: [id number] Date of Birth: 03/08/1951 Referring Provider:     CARDIAC REHAB PHASE II ORIENTATION from 01/05/2018 in [place]  Referring Provider  [name]      Encounter Date: 01/19/2018  Check In: Session Check In - 01/19/18 0930      Check-In   Supervising physician immediately available to respond to emergencies  See telemetry face sheet for immediately available ER MD    Location  [facility]    Staff Present  Benay Pike, Exercise Physiologist;Haivyn Oravec Wynetta Emery, RN, BSN    Medication changes reported      No    Fall or balance concerns reported     No    Warm-up and Cool-down  Performed as group-led instruction    Resistance Training Performed  Yes    VAD Patient?  No    PAD/SET Patient?  No      Pain Assessment   Currently in Pain?  No/denies    Pain Score  0-No pain    Multiple Pain Sites  No       Capillary Blood Glucose: No results found for this or any previous visit (from the past 24 hour(s)).    Social History   Tobacco Use  Smoking Status Current Every Day Smoker  . Packs/day: 0.75  . Years: 50.00  . Pack years: 37.50  . Types: Cigars  Smokeless Tobacco Never Used  Tobacco Comment   he is wearing a patch. He is smoking about 5-10 cigarettes daily.     Goals Met:  Independence with exercise equipment Exercise tolerated well No report of cardiac concerns or symptoms Strength training completed today  Goals Unmet:  Not Applicable  Comments: Pt able to follow exercise prescription today without complaint.  Will continue to monitor for progression. Check out 1030.   Dr. Kate Sable is Medical Director for [facility] Cardiac and Pulmonary Rehab.

## 2018-01-21 ENCOUNTER — Ambulatory Visit (INDEPENDENT_AMBULATORY_CARE_PROVIDER_SITE_OTHER): Payer: BLUE CROSS/BLUE SHIELD | Admitting: Cardiology

## 2018-01-21 ENCOUNTER — Encounter: Payer: Self-pay | Admitting: Cardiology

## 2018-01-21 ENCOUNTER — Encounter (HOSPITAL_COMMUNITY)
Admission: RE | Admit: 2018-01-21 | Discharge: 2018-01-21 | Disposition: A | Discharge status: Home / Self Care | Payer: BLUE CROSS/BLUE SHIELD | Source: Ambulatory Visit | Attending: Interventional Cardiology | Admitting: Interventional Cardiology

## 2018-01-21 VITALS — BP 104/62 | HR 66 | Ht 69.0 in | Wt 143.0 lb

## 2018-01-21 DIAGNOSIS — I2119 ST elevation (STEMI) myocardial infarction involving other coronary artery of inferior wall: Secondary | ICD-10-CM | POA: Diagnosis not present

## 2018-01-21 DIAGNOSIS — R945 Abnormal results of liver function studies: Secondary | ICD-10-CM | POA: Diagnosis not present

## 2018-01-21 DIAGNOSIS — Z23 Encounter for immunization: Secondary | ICD-10-CM

## 2018-01-21 DIAGNOSIS — Z955 Presence of coronary angioplasty implant and graft: Secondary | ICD-10-CM

## 2018-01-21 DIAGNOSIS — I2510 Atherosclerotic heart disease of native coronary artery without angina pectoris: Secondary | ICD-10-CM

## 2018-01-21 DIAGNOSIS — R7989 Other specified abnormal findings of blood chemistry: Secondary | ICD-10-CM

## 2018-01-21 NOTE — Patient Instructions (Signed)
Medication Instructions:  Your physician recommends that you continue on your current medications as directed. Please refer to the Current Medication list given to you today.   Labwork: none  Testing/Procedures: none  Follow-Up: Your physician recommends that you schedule a follow-up appointment in: 2 months    Any Other Special Instructions Will Be Listed Below (If Applicable). You have been referred to [facility]     If you need a refill on your cardiac medications before your next appointment, please call your pharmacy.

## 2018-01-21 NOTE — Progress Notes (Signed)
Clinical Summary [name] is a 67 y.o.male seen today for follow up of the following medical problems.    1. CAD - admit 11/2017 with inferior STEMI, mid RCA 100% lesion received thrombectomy and DES - 11/2017 echo LVEF 55-60% - medical therapy limited by soft bp's. Started on high dost statin but had LFT elevation and discontinued. Started on zetia 56m daily.  - on last check LFTs were still mildly elevated, continue to hold on statin. Has repeat in a few weeks.  - denies any chest pain.  - compliant with meds.   2. Elevated LFTs  Looking back elevated LFTs elevated as far back 06/2012 - unclear if solely due to his EtOH abuse.   3. Tobacco history - +50 years. Not ready to quit - 2016 MRA no aneurysm.      Past Medical History:  Diagnosis Date  . COPD (chronic obstructive pulmonary disease) ([place]    for 10 years or more   . History of radiation therapy 06/03/17- 06/09/17   Left Lung treated to 54 Gy with 3 fx of 18 Gy. SBRT  . [place]   . Myocardial infarction ([facility]   . Skin cancer of face 07/2015   skin cancer [facility] face [facility]    Allergies  Allergen Reactions  . Bee Venom Hives     Current Outpatient Medications  Medication Sig Dispense Refill  . albuterol (PROAIR HFA) 108 (90 BASE) MCG/ACT inhaler Inhale 2 puffs into the lungs every 6 (six) hours as needed for wheezing.    .Marland Kitchenaspirin EC 81 MG tablet Take 81 mg by mouth daily.    . bisoprolol (ZEBETA) 5 MG tablet Take 0.5 tablets (2.5 mg total) by mouth daily. 60 tablet 1  . Blood Pressure KIT 1 Units by Does not apply route daily. 1 each 0  . ezetimibe (ZETIA) 10 MG tablet Take 1 tablet (10 mg total) by mouth daily. 90 tablet 1  . fluticasone (FLONASE) 50 MCG/ACT nasal spray Place 1 spray into both nostrils daily.    .Marland KitchenHYDROMET 5-1.5 MG/5ML syrup Take 5 mLs by mouth 4 (four) times daily as needed for cough.   0  . LORazepam (ATIVAN) 0.5 MG tablet Take 1 tablet (0.5 mg  total) by mouth every 6 (six) hours as needed for anxiety. 25 tablet 0  . Multiple Vitamins-Minerals (MULTIVITAMIN WITH MINERALS) tablet Take 1 tablet by mouth daily.    . nitroGLYCERIN (NITROSTAT) 0.4 MG SL tablet Place 1 tablet (0.4 mg total) under the tongue every 5 (five) minutes as needed. 25 tablet 3  . Potassium 99 MG TABS Take 1 tablet by mouth daily.     . tamsulosin (FLOMAX) 0.4 MG CAPS capsule Take 0.4 mg by mouth daily.    . Tetrahydrozoline HCl (VISINE OP) Place 1 drop into both eyes daily as needed (Dry Eyes).    . ticagrelor (BRILINTA) 90 MG TABS tablet Take 1 tablet (90 mg total) by mouth 2 (two) times daily. 180 tablet 2  . TRELEGY ELLIPTA 100-62.5-25 MCG/INH AEPB INHALE 1 PUFF INTO THE LUNGS EVERY DAY 60 each 11  . vitamin E 400 UNIT capsule Take 1 tablet by mouth daily.      No current facility-administered medications for this visit.      Past Surgical History:  Procedure Laterality Date  . COLONOSCOPY  2004   Dr. JTruett Bass . COLONOSCOPY N/A 08/27/2012   Procedure: COLONOSCOPY;  Surgeon: NBernadene Person  Adam Loan, MD;  [facility];  Service: Endoscopy;  Laterality: N/A;  1200  . CORONARY STENT INTERVENTION N/A 11/23/2017   Procedure: CORONARY STENT INTERVENTION;  Surgeon: Adam Booze, MD;  Location: [place] CV LAB;  Service: Cardiovascular;  Laterality: N/A;  . LEFT HEART CATH AND CORONARY ANGIOGRAPHY N/A 11/23/2017   Procedure: LEFT HEART CATH AND CORONARY ANGIOGRAPHY;  Surgeon: Adam Booze, MD;  Location: [place] CV LAB;  Service: Cardiovascular;  Laterality: N/A;     Allergies  Allergen Reactions  . Bee Venom Hives      Family History  Problem Relation Age of Onset  . Breast cancer Sister   . Colon cancer Neg Hx      Social History [name] reports that he has been smoking cigars. He has a 37.50 pack-year smoking history. He has never used smokeless tobacco. [name] reports that he drinks alcohol.   Review of  Systems CONSTITUTIONAL: No weight loss, fever, chills, weakness or fatigue.  HEENT: Eyes: No visual loss, blurred vision, double vision or yellow sclerae.No hearing loss, sneezing, congestion, runny nose or sore throat.  SKIN: No rash or itching.  CARDIOVASCULAR: per hpi RESPIRATORY: No shortness of breath, cough or sputum.  GASTROINTESTINAL: No anorexia, nausea, vomiting or diarrhea. No abdominal pain or blood.  GENITOURINARY: No burning on urination, no polyuria NEUROLOGICAL: No headache, dizziness, syncope, paralysis, ataxia, numbness or tingling in the extremities. No change in bowel or bladder control.  MUSCULOSKELETAL: No muscle, back pain, joint pain or stiffness.  LYMPHATICS: No enlarged nodes. No history of splenectomy.  PSYCHIATRIC: No history of depression or anxiety.  ENDOCRINOLOGIC: No reports of sweating, cold or heat intolerance. No polyuria or polydipsia.  Marland Kitchen   Physical Examination Vitals:   01/21/18 1140  BP: 104/62  Pulse: 66  SpO2: 95%   Vitals:   01/21/18 1140  Weight: 143 lb (64.9 kg)  Height: 5' 9"  (1.753 m)    Gen: resting comfortably, no acute distress HEENT: no scleral icterus, pupils equal round and reactive, no palptable cervical adenopathy,  CV: RRR, no m/r/g,no jvd Resp: Clear to auscultation bilaterally GI: abdomen is soft, non-tender, non-distended, normal bowel sounds, no hepatosplenomegaly MSK: extremities are warm, no edema.  Skin: warm, no rash Neuro:  no focal deficits Psych: appropriate affect    Assessment and Plan  1. CAD - doing well s/p PCI, continue current meds - medical therapy limited by soft bp's, chronically elevated LFTs   2. Elevated LFTs - several year history, unclear if solely related to his EtOH use - we will refer to GI. We would like to start statin if not contraindicated        Arnoldo Lenis, M.D.

## 2018-01-21 NOTE — Progress Notes (Signed)
Daily Session Note  Patient Details  Name: MALEKI HIPPE MRN: [id number] Date of Birth: [birth year] Referring Provider:     CARDIAC REHAB PHASE II ORIENTATION from 01/05/2018 in [place]  Referring Provider  [name]      Encounter Date: 01/21/2018  Check In: Session Check In - 01/21/18 0930      Check-In   Supervising physician immediately available to respond to emergencies  See telemetry face sheet for immediately available ER MD    Location  [facility]    Staff Present  Benay Pike, Exercise Physiologist;Debra Wynetta Emery, RN, BSN;Diane Coad, MS, EP, [facility], Exercise Physiologist    Medication changes reported      No    Fall or balance concerns reported     No    Tobacco Cessation  No Change    Warm-up and Cool-down  Performed as group-led instruction    Resistance Training Performed  Yes    VAD Patient?  No    PAD/SET Patient?  No      Pain Assessment   Currently in Pain?  No/denies    Pain Score  0-No pain    Multiple Pain Sites  No       Capillary Blood Glucose: No results found for this or any previous visit (from the past 24 hour(s)).    Social History   Tobacco Use  Smoking Status Current Every Day Smoker  . Packs/day: 0.75  . Years: 50.00  . Pack years: 37.50  . Types: Cigars  Smokeless Tobacco Never Used  Tobacco Comment   he is wearing a patch. He is smoking about 5-10 cigarettes daily.     Goals Met:  Independence with exercise equipment Exercise tolerated well No report of cardiac concerns or symptoms Strength training completed today  Goals Unmet:  Not Applicable  Comments: Pt able to follow exercise prescription today without complaint.  Will continue to monitor for progression. Check out 1030.   Dr. Kate Sable is Medical Director for [facility] Cardiac and Pulmonary Rehab.

## 2018-01-21 NOTE — Progress Notes (Signed)
Cardiac Individual Treatment Plan  Patient Details  Name: Adam Bass MRN: [id number] Date of Birth: 1950-11-16 Referring Provider:     CARDIAC REHAB PHASE II ORIENTATION from 01/05/2018 in [facility]  Referring Provider  [name]      Initial Encounter Date:    CARDIAC REHAB PHASE II ORIENTATION from 01/05/2018 in [facility]  Date  01/05/18      Visit Diagnosis: ST elevation myocardial infarction (STEMI) involving other coronary artery of inferior wall (HCC)  Status post coronary artery stent placement  Patient's Home Medications on Admission:  Current Outpatient Medications:  .  albuterol (PROAIR HFA) 108 (90 BASE) MCG/ACT inhaler, Inhale 2 puffs into the lungs every 6 (six) hours as needed for wheezing., Disp: , Rfl:  .  aspirin EC 81 MG tablet, Take 81 mg by mouth daily., Disp: , Rfl:  .  bisoprolol (ZEBETA) 5 MG tablet, Take 0.5 tablets (2.5 mg total) by mouth daily., Disp: 60 tablet, Rfl: 1 .  Blood Pressure KIT, 1 Units by Does not apply route daily., Disp: 1 each, Rfl: 0 .  ezetimibe (ZETIA) 10 MG tablet, Take 1 tablet (10 mg total) by mouth daily., Disp: 90 tablet, Rfl: 1 .  fluticasone (FLONASE) 50 MCG/ACT nasal spray, Place 1 spray into both nostrils daily., Disp: , Rfl:  .  LORazepam (ATIVAN) 0.5 MG tablet, Take 1 tablet (0.5 mg total) by mouth every 6 (six) hours as needed for anxiety., Disp: 25 tablet, Rfl: 0 .  Multiple Vitamins-Minerals (MULTIVITAMIN WITH MINERALS) tablet, Take 1 tablet by mouth daily., Disp: , Rfl:  .  nitroGLYCERIN (NITROSTAT) 0.4 MG SL tablet, Place 1 tablet (0.4 mg total) under the tongue every 5 (five) minutes as needed., Disp: 25 tablet, Rfl: 3 .  Potassium 99 MG TABS, Take 1 tablet by mouth daily. , Disp: , Rfl:  .  tamsulosin (FLOMAX) 0.4 MG CAPS capsule, Take 0.4 mg by mouth daily., Disp: , Rfl:  .  Tetrahydrozoline HCl (VISINE OP), Place 1 drop into both eyes daily as needed (Dry Eyes).,  Disp: , Rfl:  .  ticagrelor (BRILINTA) 90 MG TABS tablet, Take 1 tablet (90 mg total) by mouth 2 (two) times daily., Disp: 180 tablet, Rfl: 2 .  TRELEGY ELLIPTA 100-62.5-25 MCG/INH AEPB, INHALE 1 PUFF INTO THE LUNGS EVERY DAY, Disp: 60 each, Rfl: 11 .  vitamin E 400 UNIT capsule, Take 1 tablet by mouth daily. , Disp: , Rfl:   Past Medical History: Past Medical History:  Diagnosis Date  . COPD (chronic obstructive pulmonary disease) ([place])    for 10 years or more   . History of radiation therapy 06/03/17- 06/09/17   Left Lung treated to 54 Gy with 3 fx of 18 Gy. SBRT  . Lung cancer ([place])   . Myocardial infarction ([place])   . Skin cancer of face 07/2015   skin cancer removed from face by Dr. Tarri Glenn in [place]    Tobacco Use: Social History   Tobacco Use  Smoking Status Current Every Day Smoker  . Packs/day: 0.75  . Years: 50.00  . Pack years: 37.50  . Types: Cigars  Smokeless Tobacco Never Used  Tobacco Comment   he is wearing a patch. He is smoking about 5-10 cigarettes daily.     Labs: Recent Review Flowsheet Data    Labs for ITP Cardiac and Pulmonary Rehab Latest Ref Rng & Units 05/06/2017 11/23/2017 11/24/2017   Cholestrol 0 - 200 mg/dL - 146 140  LDLCALC 0 - 99 mg/dL - 100(H) 84   HDL >40 mg/dL - 35(L) 33(L)   Trlycerides <150 mg/dL - 53 116   Hemoglobin A1c 4.8 - 5.6 % - - 5.0   PHART 7.350 - 7.450 7.444 - -   PCO2ART 32.0 - 48.0 mmHg 38.9 - -   HCO3 20.0 - 28.0 mmol/L 26.7 - -   TCO2 22 - 32 mmol/L - 23 -   O2SAT % 95.9 - -      Capillary Blood Glucose: Lab Results  Component Value Date   GLUCAP 104 (H) 04/02/2017   GLUCAP 112 (H) 10/02/2016     Exercise Target Goals: Exercise Program Goal: Individual exercise prescription set using results from initial 6 min walk test and THRR while considering  patient's activity barriers and safety.   Exercise Prescription Goal: Starting with aerobic activity 30 plus minutes a day, 3 days per week for initial exercise  prescription. Provide home exercise prescription and guidelines that participant acknowledges understanding prior to discharge.  Activity Barriers & Risk Stratification: Activity Barriers & Cardiac Risk Stratification - 01/05/18 1029      Activity Barriers & Cardiac Risk Stratification   Activity Barriers  Joint Problems;Deconditioning;Muscular Weakness;Shortness of Breath    Cardiac Risk Stratification  High       6 Minute Walk: 6 Minute Walk    Row Name 01/05/18 1028         6 Minute Walk   Phase  Initial     Distance  1000 feet     Walk Time  6 minutes     # of Rest Breaks  0     MPH  1.89     METS  2.45     RPE  13     Perceived Dyspnea   13     VO2 Peak  10.25     Symptoms  Yes (comment)     Comments  4/10 bilateral hip pain      Resting HR  64 bpm     Resting BP  110/60     Resting Oxygen Saturation   95 %     Exercise Oxygen Saturation  during 6 min walk  92 %     Max Ex. HR  91 bpm     Max Ex. BP  138/68     2 Minute Post BP  114/66        Oxygen Initial Assessment:   Oxygen Re-Evaluation:   Oxygen Discharge (Final Oxygen Re-Evaluation):   Initial Exercise Prescription: Initial Exercise Prescription - 01/05/18 1000      Date of Initial Exercise RX and Referring Provider   Date  01/05/18    Referring Provider  [facility]    Expected Discharge Date  04/06/18      NuStep   Level  1    SPM  55    Minutes  17    METs  1.9      Arm Ergometer   Level  1.5    Watts  11    RPM  41    Minutes  17    METs  1.7      Prescription Details   Frequency (times per week)  3    Duration  Progress to 30 minutes of continuous aerobic without signs/symptoms of physical distress      Intensity   THRR 40-80% of Max Heartrate  [id number]    Ratings of Perceived Exertion  11-13    Perceived  Dyspnea  0-4      Progression   Progression  Continue progressive overload as per policy without signs/symptoms or physical distress.      Resistance Training    Training Prescription  Yes    Weight  1    Reps  10-15       Perform Capillary Blood Glucose checks as needed.  Exercise Prescription Changes:  Exercise Prescription Changes    Row Name 01/05/18 1000             Home Exercise Plan   Plans to continue exercise at  Home (comment) walking        Frequency  Add 2 additional days to program exercise sessions.       Initial Home Exercises Provided  01/05/18          Exercise Comments:  Exercise Comments    Row Name 01/19/18 1518           Exercise Comments  Patient has done well in the program so far. He wants to get strong enough to return to work.           Exercise Goals and Review:  Exercise Goals    Row Name 01/05/18 1041             Exercise Goals   Increase Physical Activity  Yes       Intervention  Provide advice, education, support and counseling about physical activity/exercise needs.;Develop an individualized exercise prescription for aerobic and resistive training based on initial evaluation findings, risk stratification, comorbidities and participant's personal goals.       Expected Outcomes  Short Term: Attend rehab on a regular basis to increase amount of physical activity.       Increase Strength and Stamina  Yes       Intervention  Provide advice, education, support and counseling about physical activity/exercise needs.;Develop an individualized exercise prescription for aerobic and resistive training based on initial evaluation findings, risk stratification, comorbidities and participant's personal goals.       Expected Outcomes  Short Term: Increase workloads from initial exercise prescription for resistance, speed, and METs.;Short Term: Perform resistance training exercises routinely during rehab and add in resistance training at home;Long Term: Improve cardiorespiratory fitness, muscular endurance and strength as measured by increased METs and functional capacity (6MWT)       Able to understand and  use rate of perceived exertion (RPE) scale  Yes       Intervention  Provide education and explanation on how to use RPE scale       Expected Outcomes  Short Term: Able to use RPE daily in rehab to express subjective intensity level;Long Term:  Able to use RPE to guide intensity level when exercising independently       Able to understand and use Dyspnea scale  Yes       Intervention  Provide education and explanation on how to use Dyspnea scale       Expected Outcomes  Short Term: Able to use Dyspnea scale daily in rehab to express subjective sense of shortness of breath during exertion;Long Term: Able to use Dyspnea scale to guide intensity level when exercising independently       Knowledge and understanding of Target Heart Rate Range (THRR)  Yes       Intervention  Provide education and explanation of THRR including how the numbers were predicted and where they are located for reference       Expected Outcomes  Short Term: Able to state/look up THRR;Long Term: Able to use THRR to govern intensity when exercising independently;Short Term: Able to use daily as guideline for intensity in rehab       Able to check pulse independently  Yes       Intervention  Provide education and demonstration on how to check pulse in carotid and radial arteries.;Review the importance of being able to check your own pulse for safety during independent exercise       Expected Outcomes  Short Term: Able to explain why pulse checking is important during independent exercise;Long Term: Able to check pulse independently and accurately       Understanding of Exercise Prescription  Yes       Intervention  Provide education, explanation, and written materials on patient's individual exercise prescription       Expected Outcomes  Short Term: Able to explain program exercise prescription;Long Term: Able to explain home exercise prescription to exercise independently          Exercise Goals Re-Evaluation : Exercise Goals  Re-Evaluation    Row Name 01/19/18 1516             Exercise Goal Re-Evaluation   Exercise Goals Review  Increase Physical Activity;Increase Strength and Stamina;Able to understand and use rate of perceived exertion (RPE) scale;Knowledge and understanding of Target Heart Rate Range (THRR);Understanding of Exercise Prescription;Able to understand and use Dyspnea scale;Able to check pulse independently       Comments  Patient is new to the program. He has completed 6 sessions. He has tolerated exercise well. We will continue to monitor him and progress as tolerated.        Expected Outcomes  Increase strength, learn to eat healthier.            Discharge Exercise Prescription (Final Exercise Prescription Changes): Exercise Prescription Changes - 01/05/18 1000      Home Exercise Plan   Plans to continue exercise at  Home (comment)   walking    Frequency  Add 2 additional days to program exercise sessions.    Initial Home Exercises Provided  01/05/18       Nutrition:  Target Goals: Understanding of nutrition guidelines, daily intake of sodium '1500mg'$ , cholesterol '200mg'$ , calories 30% from fat and 7% or less from saturated fats, daily to have 5 or more servings of fruits and vegetables.  Biometrics: Pre Biometrics - 01/05/18 1041      Pre Biometrics   Height  '5\' 9"'$  (1.753 m)    Weight  64.9 kg    Waist Circumference  30.25 inches    Hip Circumference  32.5 inches    Waist to Hip Ratio  0.93 %    BMI (Calculated)  21.12    Triceps Skinfold  3 mm    % Body Fat  14.2 %    Grip Strength  19.4 kg    Flexibility  16.3 in    Single Leg Stand  5 seconds        Nutrition Therapy Plan and Nutrition Goals: Nutrition Therapy & Goals - 01/15/18 1553      Personal Nutrition Goals   Nutrition Goal  For heart healthy choices add >50% of whole grains, make half their plate fruits and vegetables. Discuss the difference between starchy vegetables and leafy greens, and how leafy vegetables  provide fiber, helps maintain healthy weight, helps control blood glucose, and lowers cholesterol.  Discuss purchasing fresh or frozen vegetable to reduce sodium and not  to add grease, fat or sugar. Consume <18oz of red meat per week. Consume lean cuts of meats and very little of meats high in sodium and nitrates such as pork and lunch meats. Discussed portion control for all food groups.      Comments  Patient met with RD 01/15/18.      Intervention Plan   Intervention  Nutrition handout(s) given to patient.    Expected Outcomes  Short Term Goal: Understand basic principles of dietary content, such as calories, fat, sodium, cholesterol and nutrients.       Nutrition Assessments: Nutrition Assessments - 01/05/18 0942      MEDFICTS Scores   Pre Score  50       Nutrition Goals Re-Evaluation: Nutrition Goals Re-Evaluation    Row Name 01/21/18 1510             Goals   Current Weight  142 lb (64.4 kg)       Nutrition Goal  For heart healthy choices add >50% of whole grains, make half their plate fruits and vegetables. Discuss the difference between starchy vegetables and leafy greens, and how leafy vegetables provide fiber, helps maintain healthy weight, helps control blood glucose, and lowers cholesterol.  Discuss purchasing fresh or frozen vegetable to reduce sodium and not to add grease, fat or sugar. Consume <18oz of red meat per week. Consume lean cuts of meats and very little of meats high in sodium and nitrates such as pork and lunch meats. Discussed portion control for all food groups.         Comment  Patient has lost 1 lb since his orientation visit. He says he is trying to eat heart healthy. Will continue to monitor for progress.        Expected Outcome  Patient will continue to work toward meeting his nutritional goals.           Nutrition Goals Discharge (Final Nutrition Goals Re-Evaluation): Nutrition Goals Re-Evaluation - 01/21/18 1510      Goals   Current Weight  142 lb  (64.4 kg)    Nutrition Goal  For heart healthy choices add >50% of whole grains, make half their plate fruits and vegetables. Discuss the difference between starchy vegetables and leafy greens, and how leafy vegetables provide fiber, helps maintain healthy weight, helps control blood glucose, and lowers cholesterol.  Discuss purchasing fresh or frozen vegetable to reduce sodium and not to add grease, fat or sugar. Consume <18oz of red meat per week. Consume lean cuts of meats and very little of meats high in sodium and nitrates such as pork and lunch meats. Discussed portion control for all food groups.      Comment  Patient has lost 1 lb since his orientation visit. He says he is trying to eat heart healthy. Will continue to monitor for progress.     Expected Outcome  Patient will continue to work toward meeting his nutritional goals.        Psychosocial: Target Goals: Acknowledge presence or absence of significant depression and/or stress, maximize coping skills, provide positive support system. Participant is able to verbalize types and ability to use techniques and skills needed for reducing stress and depression.  Initial Review & Psychosocial Screening: Initial Psych Review & Screening - 01/05/18 1032      Initial Review   Current issues with  None Identified      Family Dynamics   Good Support System?  Yes    Comments  Patient has  good family support from his daughters and his ex-wife and from friends.       Barriers   Psychosocial barriers to participate in program  There are no identifiable barriers or psychosocial needs.      Screening Interventions   Interventions  Encouraged to exercise;Provide feedback about the scores to participant    Expected Outcomes  Long Term goal: The participant improves quality of Life and PHQ9 Scores as seen by post scores and/or verbalization of changes       Quality of Life Scores: Quality of Life - 01/05/18 1043      Quality of Life   Select   Quality of Life      Quality of Life Scores   Health/Function Pre  15.53 %    Socioeconomic Pre  24 %    Psych/Spiritual Pre  24.86 %    Family Pre  27 %    GLOBAL Pre  20.7 %      Scores of 19 and below usually indicate a poorer quality of life in these areas.  A difference of  2-3 points is a clinically meaningful difference.  A difference of 2-3 points in the total score of the Quality of Life Index has been associated with significant improvement in overall quality of life, self-image, physical symptoms, and general health in studies assessing change in quality of life.  PHQ-9: Recent Review Flowsheet Data    Depression screen [facility] 2/9 01/05/2018 09/12/2017 07/04/2017 04/30/2017   Decreased Interest 0 0 0 0   Down, Depressed, Hopeless 0 0 0 0   PHQ - 2 Score 0 0 0 0   Altered sleeping 1 - - -   Tired, decreased energy 1 - - -   Change in appetite 1 - - -   Feeling bad or failure about yourself  0 - - -   Trouble concentrating 0 - - -   Moving slowly or fidgety/restless 1 - - -   Suicidal thoughts 0 - - -   PHQ-9 Score 4 - - -   Difficult doing work/chores Somewhat difficult - - -     Interpretation of Total Score  Total Score Depression Severity:  1-4 = Minimal depression, 5-9 = Mild depression, 10-14 = Moderate depression, 15-19 = Moderately severe depression, 20-27 = Severe depression   Psychosocial Evaluation and Intervention: Psychosocial Evaluation - 01/05/18 1034      Psychosocial Evaluation & Interventions   Interventions  Encouraged to exercise with the program and follow exercise prescription;Stress management education;Relaxation education    Comments  Patient's initial QOL score was 20.70 and his PHQ_9 score was 4 with no psychosocial issues identified. He does have difficulty falling asleep since his procedure. His PCP prescribed Lorazepam 0.5 mg as needed for sleep.     Expected Outcomes  Patient will have no psychosocial issues identified at discharge. His QOL and  PHQ-9 scores will improve at discharge.     Continue Psychosocial Services   No Follow up required       Psychosocial Re-Evaluation: Psychosocial Re-Evaluation    [place] Name 01/21/18 1516             Psychosocial Re-Evaluation   Current issues with  None Identified       Comments  Patient's initial QOL score was 20.70 and his PHQ-9 score was 4 with no issues psychosocial issues identified.       Expected Outcomes  Patient will have no psychosocial issues identified at discharge.  Interventions  Relaxation education;Stress management education;Encouraged to attend Cardiac Rehabilitation for the exercise       Continue Psychosocial Services   No Follow up required          Psychosocial Discharge (Final Psychosocial Re-Evaluation): Psychosocial Re-Evaluation - 01/21/18 1516      Psychosocial Re-Evaluation   Current issues with  None Identified    Comments  Patient's initial QOL score was 20.70 and his PHQ-9 score was 4 with no issues psychosocial issues identified.    Expected Outcomes  Patient will have no psychosocial issues identified at discharge.     Interventions  Relaxation education;Stress management education;Encouraged to attend Cardiac Rehabilitation for the exercise    Continue Psychosocial Services   No Follow up required       Vocational Rehabilitation: Provide vocational rehab assistance to qualifying candidates.   Vocational Rehab Evaluation & Intervention: Vocational Rehab - 01/05/18 0943      Initial Vocational Rehab Evaluation & Intervention   Assessment shows need for Vocational Rehabilitation  No       Education: Education Goals: Education classes will be provided on a weekly basis, covering required topics. Participant will state understanding/return demonstration of topics presented.  Learning Barriers/Preferences: Learning Barriers/Preferences - 01/05/18 0944      Learning Barriers/Preferences   Learning Barriers  None    Learning  Preferences  Written Material;Verbal Instruction;Skilled Demonstration       Education Topics: Hypertension, Hypertension Reduction -Define heart disease and high blood pressure. Discus how high blood pressure affects the body and ways to reduce high blood pressure.   Exercise and Your Heart -Discuss why it is important to exercise, the FITT principles of exercise, normal and abnormal responses to exercise, and how to exercise safely.   Angina -Discuss definition of angina, causes of angina, treatment of angina, and how to decrease risk of having angina.   Cardiac Medications -Review what the following cardiac medications are used for, how they affect the body, and side effects that may occur when taking the medications.  Medications include Aspirin, Beta blockers, calcium channel blockers, ACE Inhibitors, angiotensin receptor blockers, diuretics, digoxin, and antihyperlipidemics.   Congestive Heart Failure -Discuss the definition of CHF, how to live with CHF, the signs and symptoms of CHF, and how keep track of weight and sodium intake.   Heart Disease and Intimacy -Discus the effect sexual activity has on the heart, how changes occur during intimacy as we age, and safety during sexual activity.   Smoking Cessation / COPD -Discuss different methods to quit smoking, the health benefits of quitting smoking, and the definition of COPD.   Nutrition I: Fats -Discuss the types of cholesterol, what cholesterol does to the heart, and how cholesterol levels can be controlled.   CARDIAC REHAB PHASE II EXERCISE from 01/21/2018 in [facility]  Date  01/07/18  Educator  D. Coad  Instruction Review Code  2- Demonstrated Understanding      Nutrition II: Labels -Discuss the different components of food labels and how to read food label   [place] from 01/21/2018 in [facility]  Date  01/14/18  Educator  Etheleen Mayhew   Instruction Review Code  2- Demonstrated Understanding      Heart Parts/Heart Disease and PAD -Discuss the anatomy of the heart, the pathway of blood circulation through the heart, and these are affected by heart disease.   CARDIAC REHAB PHASE II EXERCISE from 01/21/2018 in [place]  Date  01/21/18  Educator  Etheleen Mayhew  Instruction Review Code  2- Demonstrated Understanding      Stress I: Signs and Symptoms -Discuss the causes of stress, how stress may lead to anxiety and depression, and ways to limit stress.   Stress II: Relaxation -Discuss different types of relaxation techniques to limit stress.   Warning Signs of Stroke / TIA -Discuss definition of a stroke, what the signs and symptoms are of a stroke, and how to identify when someone is having stroke.   Knowledge Questionnaire Score: Knowledge Questionnaire Score - 01/05/18 0942      Knowledge Questionnaire Score   Pre Score  25/28       Core Components/Risk Factors/Patient Goals at Admission: Personal Goals and Risk Factors at Admission - 01/05/18 0944      Core Components/Risk Factors/Patient Goals on Admission    Weight Management  Weight Maintenance    Tobacco Cessation  Yes    Intervention  Assist the participant in steps to quit. Provide individualized education and counseling about committing to Tobacco Cessation, relapse prevention, and pharmacological support that can be provided by physician.;Advice worker, assist with locating and accessing local/national Quit Smoking programs, and support quit date choice.    Expected Outcomes  Short Term: Will demonstrate readiness to quit, by selecting a quit date.    Personal Goal Other  Yes    Personal Goal  Learn more about heart heatlhy eating; increase strength; get strong enough to get back to work. Quit smoking or reduce his smoking.     Intervention  Patient will attend CR 3 days/week and supplement with exercise 2  days/week at home by walking.     Expected Outcomes  Patient will meet his personal goals.        Core Components/Risk Factors/Patient Goals Review:  Goals and Risk Factor Review    Row Name 01/21/18 1511             Core Components/Risk Factors/Patient Goals Review   Personal Goals Review  Tobacco Cessation;Weight Management/Obesity Learn how to eat healthier; increase strength; quit smoking or decrease smoking. Get back to work.        Review  Patient has completed 7 sessions losing one lb since his orientation visit. He is doing well in the program with some progression. He did meet with RD and said he learned some tips about eating healthy. He continues to say he is not ready to quit smoking yet and says he is not interested in a smoking cessation class. Will continue to encourage patient to quit smoking.        Expected Outcomes  Patient will continue to attend sessions and complete the program meeting his personal goals.           Core Components/Risk Factors/Patient Goals at Discharge (Final Review):  Goals and Risk Factor Review - 01/21/18 1511      Core Components/Risk Factors/Patient Goals Review   Personal Goals Review  Tobacco Cessation;Weight Management/Obesity   Learn how to eat healthier; increase strength; quit smoking or decrease smoking. Get back to work.    Review  Patient has completed 7 sessions losing one lb since his orientation visit. He is doing well in the program with some progression. He did meet with RD and said he learned some tips about eating healthy. He continues to say he is not ready to quit smoking yet and says he is not interested in a smoking cessation class. Will continue to encourage  patient to quit smoking.     Expected Outcomes  Patient will continue to attend sessions and complete the program meeting his personal goals.        ITP Comments: ITP Comments    Row Name 01/05/18 0927           ITP Comments  Patient is a 67 yr old male  referred by Dr. 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 He is currenlty smoking 3/4 ppd. He is interested iin quiting but not actively trying to quit. He has no barriers to CR. He has COPD treated with inhalers.           Comments: ITP REVIEW Patient doing well in the program. Will continue to monitor for progress.

## 2018-01-23 ENCOUNTER — Encounter (HOSPITAL_COMMUNITY)
Admission: RE | Admit: 2018-01-23 | Discharge: 2018-01-23 | Disposition: A | Discharge status: Home / Self Care | Payer: BLUE CROSS/BLUE SHIELD | Source: Ambulatory Visit | Attending: Interventional Cardiology | Admitting: Interventional Cardiology

## 2018-01-23 DIAGNOSIS — Z955 Presence of coronary angioplasty implant and graft: Secondary | ICD-10-CM | POA: Diagnosis not present

## 2018-01-23 DIAGNOSIS — I2119 ST elevation (STEMI) myocardial infarction involving other coronary artery of inferior wall: Secondary | ICD-10-CM

## 2018-01-23 NOTE — Progress Notes (Signed)
Daily Session Note  Patient Details  Name: Adam Bass MRN: [id number] Date of Birth: 1950/10/01 Referring Provider:     CARDIAC REHAB PHASE II ORIENTATION from 01/05/2018 in [place]  Referring Provider  [name]      Encounter Date: 01/23/2018  Check In: Session Check In - 01/23/18 0930      Check-In   Supervising physician immediately available to respond to emergencies  See telemetry face sheet for immediately available ER MD    Location  [facility]    Staff Present  Benay Pike, Exercise Physiologist;Debra Wynetta Emery, RN, BSN    Medication changes reported      No    Fall or balance concerns reported     No    Tobacco Cessation  No Change    Warm-up and Cool-down  Performed as group-led instruction    Resistance Training Performed  Yes    VAD Patient?  No    PAD/SET Patient?  No      Pain Assessment   Currently in Pain?  No/denies    Pain Score  0-No pain    Multiple Pain Sites  No       Capillary Blood Glucose: No results found for this or any previous visit (from the past 24 hour(s)).    Social History   Tobacco Use  Smoking Status Current Every Day Smoker  . Packs/day: 0.75  . Years: 50.00  . Pack years: 37.50  . Types: Cigars  Smokeless Tobacco Never Used  Tobacco Comment   he is wearing a patch. He is smoking about 5-10 cigarettes daily.     Goals Met:  Independence with exercise equipment Exercise tolerated well No report of cardiac concerns or symptoms Strength training completed today  Goals Unmet:  Not Applicable  Comments: Pt able to follow exercise prescription today without complaint.  Will continue to monitor for progression. Check out 10:30.   Dr. Kate Sable is Medical Director for [facility] Cardiac and Pulmonary Rehab.

## 2018-01-26 ENCOUNTER — Encounter (HOSPITAL_COMMUNITY)
Admission: RE | Admit: 2018-01-26 | Discharge: 2018-01-26 | Disposition: A | Discharge status: Home / Self Care | Payer: BLUE CROSS/BLUE SHIELD | Source: Ambulatory Visit | Attending: Interventional Cardiology | Admitting: Interventional Cardiology

## 2018-01-26 DIAGNOSIS — I2119 ST elevation (STEMI) myocardial infarction involving other coronary artery of inferior wall: Secondary | ICD-10-CM | POA: Diagnosis not present

## 2018-01-26 DIAGNOSIS — Z955 Presence of coronary angioplasty implant and graft: Secondary | ICD-10-CM

## 2018-01-26 NOTE — Progress Notes (Signed)
Daily Session Note  Patient Details  Name: Adam Bass MRN: [id number] Date of Birth: Jun 21, 1950 Referring Provider:     CARDIAC REHAB PHASE II ORIENTATION from 01/05/2018 in [place]  Referring Provider  [name]      Encounter Date: 01/26/2018  Check In: Session Check In - 01/26/18 0930      Check-In   Supervising physician immediately available to respond to emergencies  See telemetry face sheet for immediately available ER MD    Location  [facility]    Staff Present  Russella Dar, MS, EP, [facility], Exercise Physiologist;Cintia Gleed, Exercise Physiologist;Debra Wynetta Emery, RN, BSN    Medication changes reported      No    Fall or balance concerns reported     No    Tobacco Cessation  No Change    Warm-up and Cool-down  Performed as group-led instruction    Resistance Training Performed  Yes    VAD Patient?  No    PAD/SET Patient?  No      Pain Assessment   Currently in Pain?  No/denies    Pain Score  0-No pain    Multiple Pain Sites  No       Capillary Blood Glucose: No results found for this or any previous visit (from the past 24 hour(s)).    Social History   Tobacco Use  Smoking Status Current Every Day Smoker  . Packs/day: 0.75  . Years: 50.00  . Pack years: 37.50  . Types: Cigars  Smokeless Tobacco Never Used  Tobacco Comment   he is wearing a patch. He is smoking about 5-10 cigarettes daily.     Goals Met:  Independence with exercise equipment Exercise tolerated well No report of cardiac concerns or symptoms Strength training completed today  Goals Unmet:  Not Applicable  Comments: Pt able to follow exercise prescription today without complaint.  Will continue to monitor for progression. Check out 10:30.   Dr. Kate Sable is Medical Director for [facility] Cardiac and Pulmonary Rehab.

## 2018-01-28 ENCOUNTER — Encounter (HOSPITAL_COMMUNITY)
Admission: RE | Admit: 2018-01-28 | Discharge: 2018-01-28 | Disposition: A | Discharge status: Home / Self Care | Payer: BLUE CROSS/BLUE SHIELD | Source: Ambulatory Visit | Attending: Interventional Cardiology | Admitting: Interventional Cardiology

## 2018-01-28 DIAGNOSIS — I2119 ST elevation (STEMI) myocardial infarction involving other coronary artery of inferior wall: Secondary | ICD-10-CM

## 2018-01-28 DIAGNOSIS — Z955 Presence of coronary angioplasty implant and graft: Secondary | ICD-10-CM | POA: Diagnosis not present

## 2018-01-28 NOTE — Progress Notes (Signed)
Daily Session Note  Patient Details  Name: Adam Bass MRN: [id number] Date of Birth: 12-13-1950 Referring Provider:     CARDIAC REHAB PHASE II ORIENTATION from 01/05/2018 in [place]  Referring Provider  [name]      Encounter Date: 01/28/2018  Check In: Session Check In - 01/28/18 0930      Check-In   Supervising physician immediately available to respond to emergencies  See telemetry face sheet for immediately available ER MD    Location  [facility]    Staff Present  Russella Dar, MS, EP, [facility], Exercise Physiologist;Norberta Stobaugh Zachery Conch, Exercise Physiologist    Medication changes reported      No    Fall or balance concerns reported     No    Tobacco Cessation  No Change    Warm-up and Cool-down  Performed as group-led instruction    Resistance Training Performed  Yes    VAD Patient?  No    PAD/SET Patient?  No      Pain Assessment   Currently in Pain?  No/denies    Pain Score  0-No pain    Multiple Pain Sites  No       Capillary Blood Glucose: No results found for this or any previous visit (from the past 24 hour(s)).    Social History   Tobacco Use  Smoking Status Current Every Day Smoker  . Packs/day: 0.75  . Years: 50.00  . Pack years: 37.50  . Types: Cigars  Smokeless Tobacco Never Used  Tobacco Comment   he is wearing a patch. He is smoking about 5-10 cigarettes daily.     Goals Met:  Independence with exercise equipment Exercise tolerated well No report of cardiac concerns or symptoms Strength training completed today  Goals Unmet:  Not Applicable  Comments: Pt able to follow exercise prescription today without complaint.  Will continue to monitor for progression. Check out 10:30.   Dr. Kate Sable is Medical Director for [facility] Cardiac and Pulmonary Rehab.

## 2018-01-30 ENCOUNTER — Encounter (HOSPITAL_COMMUNITY): Payer: BLUE CROSS/BLUE SHIELD

## 2018-02-02 ENCOUNTER — Ambulatory Visit (INDEPENDENT_AMBULATORY_CARE_PROVIDER_SITE_OTHER): Payer: BLUE CROSS/BLUE SHIELD | Admitting: Internal Medicine

## 2018-02-02 ENCOUNTER — Encounter (HOSPITAL_COMMUNITY)
Admission: RE | Admit: 2018-02-02 | Discharge: 2018-02-02 | Disposition: A | Discharge status: Home / Self Care | Payer: BLUE CROSS/BLUE SHIELD | Source: Ambulatory Visit | Attending: Interventional Cardiology | Admitting: Interventional Cardiology

## 2018-02-02 ENCOUNTER — Telehealth: Payer: Self-pay | Admitting: Cardiology

## 2018-02-02 ENCOUNTER — Encounter (INDEPENDENT_AMBULATORY_CARE_PROVIDER_SITE_OTHER): Payer: Self-pay | Admitting: *Deleted

## 2018-02-02 ENCOUNTER — Encounter (INDEPENDENT_AMBULATORY_CARE_PROVIDER_SITE_OTHER): Payer: Self-pay | Admitting: Internal Medicine

## 2018-02-02 VITALS — BP 132/70 | HR 68 | Temp 98.9°F | Ht 69.0 in | Wt 146.6 lb

## 2018-02-02 DIAGNOSIS — Z86010 Personal history of colonic polyps: Secondary | ICD-10-CM

## 2018-02-02 DIAGNOSIS — I2119 ST elevation (STEMI) myocardial infarction involving other coronary artery of inferior wall: Secondary | ICD-10-CM | POA: Diagnosis not present

## 2018-02-02 DIAGNOSIS — R160 Hepatomegaly, not elsewhere classified: Secondary | ICD-10-CM

## 2018-02-02 DIAGNOSIS — Z85118 Personal history of other malignant neoplasm of bronchus and lung: Secondary | ICD-10-CM | POA: Diagnosis not present

## 2018-02-02 DIAGNOSIS — R748 Abnormal levels of other serum enzymes: Secondary | ICD-10-CM | POA: Diagnosis not present

## 2018-02-02 DIAGNOSIS — K7460 Unspecified cirrhosis of liver: Secondary | ICD-10-CM

## 2018-02-02 DIAGNOSIS — Z955 Presence of coronary angioplasty implant and graft: Secondary | ICD-10-CM | POA: Diagnosis not present

## 2018-02-02 NOTE — Progress Notes (Signed)
Subjective:    Patient ID: Adam Bass, male    DOB: Sep 18, 1950, 67 y.o.   MRN: [id number]  HPI Referred by Dr. Luan Pulling for elevated liver enzymes. Lab results reviewed.  Enzymes have been elevated at least since 2014. Hx of lung cancer with radiation and is followed by the [facility].  From 12/05/2017 Dr. Isidore Moos, responded to SBRT. Scan appears stable no evidence of progression or recurrence on recent CT.  He has never done IV drugs. He drinks about 3-6 beers a day, but not every night. He continues to smoke about 1 pack a day.  His appetite is okay. He has lost about 12 pounds since his MI in August of this year. His BMs move normally.   Hx of MI in August of this year and has a cardiac stent.   His last colonoscopy was in May of 2014.   Impression:  Examination performed to cecum. Few small diverticula at sigmoid colon. Three small polyps cold snare from proximal sigmoid colon. Two were retrieved and submitted together; third one was lost. 7 mm Sessile polyp snared from distal colon.  Patient had 4 polyps removed one was lost. The ones that were removed are tubular adenomas Next colonoscopy in 5 yrs.   12/04/2017: Chest CT: Upper Abdomen: Nodular liver contour compatible with cirrhosis.    CMP Latest Ref Rng & Units 01/01/2018 12/09/2017 11/27/2017  Glucose 65 - 99 mg/dL - 96 103(H)  BUN 8 - 27 mg/dL - 16 13  Creatinine 0.76 - 1.27 mg/dL - 0.91 0.68  Sodium 134 - 144 mmol/L - 139 138  Potassium 3.5 - 5.2 mmol/L - 4.7 4.0  Chloride 96 - 106 mmol/L - 101 103  CO2 20 - 29 mmol/L - 23 28  Calcium 8.6 - 10.2 mg/dL - 9.0 8.5(L)  Total Protein 6.0 - 8.5 g/dL 6.9 7.2 6.0(L)  Total Bilirubin 0.0 - 1.2 mg/dL 0.4 0.7 1.5(H)  Alkaline Phos 39 - 117 IU/L 112 102 92  AST 0 - 40 IU/L 68(H) 92(H) 93(H)  ALT 0 - 44 IU/L 91(H) 121(H) 96(H)     Review of Systems Past Medical History:  Diagnosis Date  . COPD (chronic obstructive pulmonary disease) ([place])    for 10  years or more   . History of radiation therapy 06/03/17- 06/09/17   Left Lung treated to 54 Gy with 3 fx of 18 Gy. SBRT  . Lung cancer ([place])   . Myocardial infarction ([place])   . Skin cancer of face 07/2015   skin cancer removed from face by Dr. Tarri Glenn in [place]    Past Surgical History:  Procedure Laterality Date  . COLONOSCOPY  2004   Dr. Truett Perna  . COLONOSCOPY N/A 08/27/2012   Procedure: COLONOSCOPY;  Surgeon: Rogene Houston, MD;  Location: [facility];  Service: Endoscopy;  Laterality: N/A;  1200  . CORONARY STENT INTERVENTION N/A 11/23/2017   Procedure: CORONARY STENT INTERVENTION;  Surgeon: Jettie Booze, MD;  Location: [place] CV LAB;  Service: Cardiovascular;  Laterality: N/A;  . LEFT HEART CATH AND CORONARY ANGIOGRAPHY N/A 11/23/2017   Procedure: LEFT HEART CATH AND CORONARY ANGIOGRAPHY;  Surgeon: Jettie Booze, MD;  Location: [place] CV LAB;  Service: Cardiovascular;  Laterality: N/A;    Allergies  Allergen Reactions  . Bee Venom Hives    Current Outpatient Medications on File Prior to Visit  Medication Sig Dispense Refill  . albuterol (PROAIR HFA) 108 (90 BASE) MCG/ACT  inhaler Inhale 2 puffs into the lungs every 6 (six) hours as needed for wheezing.    Marland Kitchen aspirin EC 81 MG tablet Take 81 mg by mouth daily.    . bisoprolol (ZEBETA) 5 MG tablet Take 0.5 tablets (2.5 mg total) by mouth daily. 60 tablet 1  . ezetimibe (ZETIA) 10 MG tablet Take 1 tablet (10 mg total) by mouth daily. 90 tablet 1  . fluticasone (FLONASE) 50 MCG/ACT nasal spray Place 1 spray into both nostrils daily.    Marland Kitchen LORazepam (ATIVAN) 0.5 MG tablet Take 1 tablet (0.5 mg total) by mouth every 6 (six) hours as needed for anxiety. 25 tablet 0  . Multiple Vitamins-Minerals (MULTIVITAMIN WITH MINERALS) tablet Take 1 tablet by mouth daily.    . nitroGLYCERIN (NITROSTAT) 0.4 MG SL tablet Place 1 tablet (0.4 mg total) under the tongue every 5 (five) minutes as needed. 25 tablet 3  . Potassium  99 MG TABS Take 1 tablet by mouth daily.     . tamsulosin (FLOMAX) 0.4 MG CAPS capsule Take 0.4 mg by mouth daily.    . Tetrahydrozoline HCl (VISINE OP) Place 1 drop into both eyes daily as needed (Dry Eyes).    . ticagrelor (BRILINTA) 90 MG TABS tablet Take 1 tablet (90 mg total) by mouth 2 (two) times daily. 180 tablet 2  . TRELEGY ELLIPTA 100-62.5-25 MCG/INH AEPB INHALE 1 PUFF INTO THE LUNGS EVERY DAY 60 each 11  . vitamin E 400 UNIT capsule Take 1 tablet by mouth daily.     . Blood Pressure KIT 1 Units by Does not apply route daily. 1 each 0   No current facility-administered medications on file prior to visit.         Objective:   Physical Exam Blood pressure 132/70, pulse 68, temperature 98.9 F (37.2 C), height 5' 9"  (1.753 m), weight 146 lb 9.6 oz (66.5 kg). Alert and oriented. Skin warm and dry. Oral mucosa is moist.   . Sclera anicteric, conjunctivae is pink. Thyroid not enlarged. No cervical lymphadenopathy. Lungs clear. Heart regular rate and rhythm.  Abdomen is soft. Bowel sounds are positive. Liver is enlarged. 5 finger breaths below costal margin. . No abdominal masses felt. No tenderness.  No edema to lower extremities.            Assessment & Plan:  Elevated liver enzymes.  Am going to get a Hepatic, AFP, Acute Hepatitis Panel, and US abdomen.  Hx of colon polyps. He is due for a colonoscopy. Will check with Dr. Harl Bowie and see when we can do the colonoscopy. Enlarged liver:/cirrhosis: US abdomen.  Patient advised he needs to stop drinking.

## 2018-02-02 NOTE — Progress Notes (Signed)
Daily Session Note  Patient Details  Name: Adam Bass MRN: [id number] Date of Birth: 04-29-1950 Referring Provider:     [place] from 01/05/2018 in [place]  Referring Provider  [name]      Encounter Date: 02/02/2018  Check In: Session Check In - 02/02/18 0930      Check-In   Supervising physician immediately available to respond to emergencies  See telemetry face sheet for immediately available ER MD    Location  [facility]    Staff Present  Russella Dar, MS, EP, [facility], Exercise Physiologist;Kailei Cowens Zachery Conch, Exercise Physiologist    Medication changes reported      No    Fall or balance concerns reported     No    Tobacco Cessation  No Change    Warm-up and Cool-down  Performed as group-led instruction    Resistance Training Performed  Yes    VAD Patient?  No    PAD/SET Patient?  No      Pain Assessment   Currently in Pain?  No/denies    Pain Score  0-No pain    Multiple Pain Sites  No       Capillary Blood Glucose: No results found for this or any previous visit (from the past 24 hour(s)).    Social History   Tobacco Use  Smoking Status Current Every Day Smoker  . Packs/day: 0.75  . Years: 50.00  . Pack years: 37.50  . Types: Cigars  Smokeless Tobacco Never Used  Tobacco Comment   he is wearing a patch. He is smoking about 5-10 cigarettes daily.     Goals Met:  Proper associated with RPD/PD & O2 Sat Independence with exercise equipment Exercise tolerated well No report of cardiac concerns or symptoms Strength training completed today  Goals Unmet:  Not Applicable  Comments: Pt able to follow exercise prescription today without complaint.  Will continue to monitor for progression. Check out 10:30.   Dr. Kate Sable is Medical Director for [facility] Cardiac and Pulmonary Rehab.

## 2018-02-02 NOTE — Telephone Encounter (Signed)
I will forward to Dr Harl Bowie

## 2018-02-02 NOTE — Telephone Encounter (Signed)
Deberah Castle, NP w/ Dr.Rehman saw patient today and is wanting to know if it is okay for patient to stop Brilinta for couple days for Colonoscopy.  / tg

## 2018-02-02 NOTE — Patient Instructions (Signed)
Labs and US abdomen.

## 2018-02-03 ENCOUNTER — Telehealth (INDEPENDENT_AMBULATORY_CARE_PROVIDER_SITE_OTHER): Payer: Self-pay | Admitting: Internal Medicine

## 2018-02-03 NOTE — Telephone Encounter (Signed)
Will forward to Deberah Castle NP

## 2018-02-03 NOTE — Telephone Encounter (Signed)
TCS noted in recall for 07/2018

## 2018-02-03 NOTE — Telephone Encounter (Signed)
Adam Bass, recall for a colonoscopy in 6 months.

## 2018-02-03 NOTE — Telephone Encounter (Signed)
Will put on a recall for a colonoscopy in 6 months.

## 2018-02-03 NOTE — Telephone Encounter (Signed)
Too early to stop brillinta, stent placed 11/2017. Ideally would wait a year, 6 months would be reasonable. If very high urgency for procedure could consider 3 months but some risk is associated  Zandra Abts MD

## 2018-02-04 ENCOUNTER — Encounter (HOSPITAL_COMMUNITY)
Admission: RE | Admit: 2018-02-04 | Discharge: 2018-02-04 | Disposition: A | Discharge status: Home / Self Care | Payer: BLUE CROSS/BLUE SHIELD | Source: Ambulatory Visit | Attending: Interventional Cardiology | Admitting: Interventional Cardiology

## 2018-02-04 DIAGNOSIS — Z955 Presence of coronary angioplasty implant and graft: Secondary | ICD-10-CM

## 2018-02-04 DIAGNOSIS — I2119 ST elevation (STEMI) myocardial infarction involving other coronary artery of inferior wall: Secondary | ICD-10-CM

## 2018-02-04 NOTE — Progress Notes (Signed)
Daily Session Note  Patient Details  Name: Adam Bass MRN: [id number] Date of Birth: [birth year] Referring Provider:     [place] from 01/05/2018 in [place]  Referring Provider  [name]      Encounter Date: 02/04/2018  Check In: Session Check In - 02/04/18 0930      Check-In   Supervising physician immediately available to respond to emergencies  See telemetry face sheet for immediately available ER MD    Location  [facility]    Staff Present  Russella Dar, MS, EP, [facility], Exercise Physiologist;Winefred Hillesheim Zachery Conch, Exercise Physiologist    Medication changes reported      No    Fall or balance concerns reported     No    Tobacco Cessation  No Change    Warm-up and Cool-down  Performed as group-led instruction    Resistance Training Performed  Yes    VAD Patient?  No    PAD/SET Patient?  No      Pain Assessment   Currently in Pain?  No/denies    Pain Score  0-No pain    Multiple Pain Sites  No       Capillary Blood Glucose: No results found for this or any previous visit (from the past 24 hour(s)).    Social History   Tobacco Use  Smoking Status Current Every Day Smoker  . Packs/day: 0.75  . Years: 50.00  . Pack years: 37.50  . Types: Cigars  Smokeless Tobacco Never Used  Tobacco Comment   he is wearing a patch. He is smoking about 5-10 cigarettes daily.     Goals Met:  Proper associated with RPD/PD & O2 Sat Independence with exercise equipment Exercise tolerated well No report of cardiac concerns or symptoms Strength training completed today  Goals Unmet:  Not Applicable  Comments: Pt able to follow exercise prescription today without complaint.  Will continue to monitor for progression. Check out 10:30.   Dr. Kate Sable is Medical Director for [facility] Cardiac and Pulmonary Rehab.

## 2018-02-05 ENCOUNTER — Ambulatory Visit (HOSPITAL_COMMUNITY)
Admission: RE | Admit: 2018-02-05 | Discharge: 2018-02-05 | Disposition: A | Discharge status: Home / Self Care | Payer: BLUE CROSS/BLUE SHIELD | Source: Ambulatory Visit | Attending: Internal Medicine | Admitting: Internal Medicine

## 2018-02-05 DIAGNOSIS — K769 Liver disease, unspecified: Secondary | ICD-10-CM | POA: Insufficient documentation

## 2018-02-05 DIAGNOSIS — R748 Abnormal levels of other serum enzymes: Secondary | ICD-10-CM | POA: Diagnosis not present

## 2018-02-05 DIAGNOSIS — Z85118 Personal history of other malignant neoplasm of bronchus and lung: Secondary | ICD-10-CM

## 2018-02-05 LAB — HEPATIC FUNCTION PANEL
AG Ratio: 1.3 (calc) (ref 1.0–2.5)
ALKALINE PHOSPHATASE (APISO): 91 U/L (ref 40–115)
ALT: 76 U/L — ABNORMAL HIGH (ref 9–46)
AST: 57 U/L — ABNORMAL HIGH (ref 10–35)
Albumin: 3.9 g/dL (ref 3.6–5.1)
BILIRUBIN DIRECT: 0.2 mg/dL (ref 0.0–0.2)
BILIRUBIN INDIRECT: 0.5 mg/dL (ref 0.2–1.2)
Globulin: 3 g/dL (calc) (ref 1.9–3.7)
TOTAL PROTEIN: 6.9 g/dL (ref 6.1–8.1)
Total Bilirubin: 0.7 mg/dL (ref 0.2–1.2)

## 2018-02-05 LAB — HEPATITIS PANEL, ACUTE
HEP A IGM: NONREACTIVE
Hep B C IgM: NONREACTIVE
Hepatitis B Surface Ag: NONREACTIVE
Hepatitis C Ab: REACTIVE — AB
SIGNAL TO CUT-OFF: 27.7 — AB (ref <1.00)

## 2018-02-05 LAB — HCV RNA,QUANTITATIVE REAL TIME PCR
HCV Quantitative Log: 6.37 Log IU/mL — ABNORMAL HIGH
HCV RNA, PCR, QN: 2320000 [IU]/mL — AB

## 2018-02-05 LAB — AFP TUMOR MARKER: AFP TUMOR MARKER: 163.9 ng/mL — AB (ref <6.1)

## 2018-02-06 ENCOUNTER — Other Ambulatory Visit: Payer: BLUE CROSS/BLUE SHIELD

## 2018-02-06 ENCOUNTER — Encounter (HOSPITAL_COMMUNITY)
Admission: RE | Admit: 2018-02-06 | Discharge: 2018-02-06 | Disposition: A | Discharge status: Home / Self Care | Payer: BLUE CROSS/BLUE SHIELD | Source: Ambulatory Visit | Attending: Interventional Cardiology | Admitting: Interventional Cardiology

## 2018-02-06 DIAGNOSIS — I2119 ST elevation (STEMI) myocardial infarction involving other coronary artery of inferior wall: Secondary | ICD-10-CM

## 2018-02-06 DIAGNOSIS — Z955 Presence of coronary angioplasty implant and graft: Secondary | ICD-10-CM

## 2018-02-06 NOTE — Progress Notes (Signed)
Daily Session Note  Patient Details  Name: Adam Bass MRN: [id number] Date of Birth: 1950/08/11 Referring Provider:     CARDIAC REHAB PHASE II ORIENTATION from 01/05/2018 in [place]  Referring Provider  [name]      Encounter Date: 02/06/2018  Check In: Session Check In - 02/06/18 0930      Check-In   Supervising physician immediately available to respond to emergencies  See telemetry face sheet for immediately available ER MD    Location  [facility]    Staff Present  Benay Pike, Exercise Physiologist;Debra Wynetta Emery, RN, BSN    Medication changes reported      No    Fall or balance concerns reported     No    Tobacco Cessation  No Change    Warm-up and Cool-down  Performed as group-led instruction    Resistance Training Performed  Yes    VAD Patient?  No    PAD/SET Patient?  No      Pain Assessment   Currently in Pain?  No/denies    Pain Score  0-No pain    Multiple Pain Sites  No       Capillary Blood Glucose: No results found for this or any previous visit (from the past 24 hour(s)).    Social History   Tobacco Use  Smoking Status Current Every Day Smoker  . Packs/day: 0.75  . Years: 50.00  . Pack years: 37.50  . Types: Cigars  Smokeless Tobacco Never Used  Tobacco Comment   he is wearing a patch. He is smoking about 5-10 cigarettes daily.     Goals Met:  Proper associated with RPD/PD & O2 Sat Independence with exercise equipment Exercise tolerated well No report of cardiac concerns or symptoms Strength training completed today  Goals Unmet:  Not Applicable  Comments: Pt able to follow exercise prescription today without complaint.  Will continue to monitor for progression. Check out 10:30.   Dr. Kate Sable is Medical Director for [facility] Cardiac and Pulmonary Rehab.

## 2018-02-09 ENCOUNTER — Encounter (HOSPITAL_COMMUNITY): Payer: BLUE CROSS/BLUE SHIELD

## 2018-02-09 ENCOUNTER — Telehealth (INDEPENDENT_AMBULATORY_CARE_PROVIDER_SITE_OTHER): Payer: Self-pay | Admitting: Internal Medicine

## 2018-02-09 DIAGNOSIS — K769 Liver disease, unspecified: Secondary | ICD-10-CM

## 2018-02-09 NOTE — Telephone Encounter (Signed)
Ann, CT abdomen. Liver lesions

## 2018-02-09 NOTE — Telephone Encounter (Signed)
Need order for creatinine for CT put in Epic CT sch'd 02/19/18 at 1200 (1145), npo 4 hrs, pick up contrast, left detailed message for patient

## 2018-02-10 ENCOUNTER — Telehealth (INDEPENDENT_AMBULATORY_CARE_PROVIDER_SITE_OTHER): Payer: Self-pay | Admitting: Internal Medicine

## 2018-02-10 DIAGNOSIS — K769 Liver disease, unspecified: Secondary | ICD-10-CM

## 2018-02-10 NOTE — Telephone Encounter (Signed)
Ann, cancel CT. MRI abdomen

## 2018-02-10 NOTE — Telephone Encounter (Signed)
MRI abdomen ordered

## 2018-02-10 NOTE — Telephone Encounter (Signed)
I have talked with patient. He is aware CT will be cancelled and MRI will be ordered. He has cardiac stents.

## 2018-02-10 NOTE — Telephone Encounter (Signed)
Creatinine ordered.

## 2018-02-10 NOTE — Telephone Encounter (Signed)
MRI sch'd 02/17/18 at 10 (930) npo 4 hrs, patient aware

## 2018-02-11 ENCOUNTER — Encounter (HOSPITAL_COMMUNITY)
Admission: RE | Admit: 2018-02-11 | Discharge: 2018-02-11 | Disposition: A | Discharge status: Home / Self Care | Payer: BLUE CROSS/BLUE SHIELD | Source: Ambulatory Visit | Attending: Interventional Cardiology | Admitting: Interventional Cardiology

## 2018-02-13 ENCOUNTER — Encounter (HOSPITAL_COMMUNITY): Payer: BLUE CROSS/BLUE SHIELD

## 2018-02-16 ENCOUNTER — Encounter (HOSPITAL_COMMUNITY)
Admission: RE | Admit: 2018-02-16 | Discharge: 2018-02-16 | Disposition: A | Discharge status: Home / Self Care | Payer: BLUE CROSS/BLUE SHIELD | Source: Ambulatory Visit | Attending: Interventional Cardiology | Admitting: Interventional Cardiology

## 2018-02-16 DIAGNOSIS — I2119 ST elevation (STEMI) myocardial infarction involving other coronary artery of inferior wall: Secondary | ICD-10-CM | POA: Insufficient documentation

## 2018-02-16 DIAGNOSIS — Z955 Presence of coronary angioplasty implant and graft: Secondary | ICD-10-CM | POA: Insufficient documentation

## 2018-02-16 NOTE — Progress Notes (Signed)
Daily Session Note  Patient Details  Name: Adam Bass MRN: [id number] Date of Birth: [birth year] Referring Provider:     CARDIAC REHAB PHASE II ORIENTATION from 01/05/2018 in [place]  Referring Provider  [name]      Encounter Date: 02/16/2018  Check In: Session Check In - 02/16/18 0930      Check-In   Supervising physician immediately available to respond to emergencies  See telemetry face sheet for immediately available ER MD    Location  [facility]    Staff Present  Benay Pike, Exercise Physiologist;Debra Wynetta Emery, RN, BSN    Medication changes reported      No    Fall or balance concerns reported     No    Tobacco Cessation  No Change    Warm-up and Cool-down  Performed as group-led instruction    Resistance Training Performed  Yes    VAD Patient?  No    PAD/SET Patient?  No      Pain Assessment   Currently in Pain?  No/denies    Pain Score  0-No pain    Multiple Pain Sites  No       Capillary Blood Glucose: No results found for this or any previous visit (from the past 24 hour(s)).    Social History   Tobacco Use  Smoking Status Current Every Day Smoker  . Packs/day: 0.75  . Years: 50.00  . Pack years: 37.50  . Types: Cigars  Smokeless Tobacco Never Used  Tobacco Comment   he is wearing a patch. He is smoking about 5-10 cigarettes daily.     Goals Met:  Independence with exercise equipment Exercise tolerated well No report of cardiac concerns or symptoms Strength training completed today  Goals Unmet:  Not Applicable  Comments: Pt able to follow exercise prescription today without complaint.  Will continue to monitor for progression. Check out 10:30.   Dr. Kate Sable is Medical Director for [facility] Cardiac and Pulmonary Rehab.

## 2018-02-17 ENCOUNTER — Ambulatory Visit (HOSPITAL_COMMUNITY)
Admission: RE | Admit: 2018-02-17 | Discharge: 2018-02-17 | Disposition: A | Discharge status: Home / Self Care | Payer: BLUE CROSS/BLUE SHIELD | Source: Ambulatory Visit | Attending: Internal Medicine | Admitting: Internal Medicine

## 2018-02-17 DIAGNOSIS — K769 Liver disease, unspecified: Secondary | ICD-10-CM

## 2018-02-17 DIAGNOSIS — N289 Disorder of kidney and ureter, unspecified: Secondary | ICD-10-CM | POA: Diagnosis not present

## 2018-02-17 LAB — POCT I-STAT CREATININE: Creatinine, Ser: 0.8 mg/dL (ref 0.61–1.24)

## 2018-02-17 MED ORDER — GADOBUTROL 1 MMOL/ML IV SOLN
7.0000 mL | Freq: Once | INTRAVENOUS | Status: AC | PRN
  Administered 2018-02-17: 7 mL via INTRAVENOUS

## 2018-02-18 ENCOUNTER — Encounter (HOSPITAL_COMMUNITY)
Admission: RE | Admit: 2018-02-18 | Discharge: 2018-02-18 | Disposition: A | Discharge status: Home / Self Care | Payer: BLUE CROSS/BLUE SHIELD | Source: Ambulatory Visit | Attending: Interventional Cardiology | Admitting: Interventional Cardiology

## 2018-02-18 ENCOUNTER — Telehealth (INDEPENDENT_AMBULATORY_CARE_PROVIDER_SITE_OTHER): Payer: Self-pay | Admitting: Internal Medicine

## 2018-02-18 DIAGNOSIS — Z955 Presence of coronary angioplasty implant and graft: Secondary | ICD-10-CM

## 2018-02-18 DIAGNOSIS — I2119 ST elevation (STEMI) myocardial infarction involving other coronary artery of inferior wall: Secondary | ICD-10-CM | POA: Diagnosis not present

## 2018-02-18 NOTE — Telephone Encounter (Signed)
I have spoken with patient.  Will be here around 400pm.   Put on my schedule for around 4pm tomorrow.

## 2018-02-18 NOTE — Progress Notes (Signed)
Daily Session Note  Patient Details  Name: Adam Bass MRN: [id number] Date of Birth: Aug 19, 1950 Referring Provider:     [place] from 01/05/2018 in [place]  Referring Provider  [name]      Encounter Date: 02/18/2018  Check In: Session Check In - 02/18/18 0930      Check-In   Supervising physician immediately available to respond to emergencies  See telemetry face sheet for immediately available ER MD    Location  [facility]    Staff Present  Benay Pike, Exercise Physiologist;Debra Wynetta Emery, RN, BSN;Diane Coad, MS, EP, [facility], Exercise Physiologist    Medication changes reported      No    Fall or balance concerns reported     No    Tobacco Cessation  No Change    Warm-up and Cool-down  Performed as group-led instruction    Resistance Training Performed  Yes    VAD Patient?  No    PAD/SET Patient?  No      Pain Assessment   Currently in Pain?  No/denies    Pain Score  0-No pain    Multiple Pain Sites  No       Capillary Blood Glucose: Results for orders placed or performed during the hospital encounter of 02/17/18 (from the past 24 hour(s))  I-STAT creatinine     Status: None   Collection Time: 02/17/18 10:36 AM  Result Value Ref Range   Creatinine, Ser 0.80 0.61 - 1.24 mg/dL      Social History   Tobacco Use  Smoking Status Current Every Day Smoker  . Packs/day: 0.75  . Years: 50.00  . Pack years: 37.50  . Types: Cigars  Smokeless Tobacco Never Used  Tobacco Comment   he is wearing a patch. He is smoking about 5-10 cigarettes daily.     Goals Met:  Independence with exercise equipment Exercise tolerated well No report of cardiac concerns or symptoms Strength training completed today  Goals Unmet:  Not Applicable  Comments: Pt able to follow exercise prescription today without complaint.  Will continue to monitor for progression. Check out 10:30.   Dr. Kate Sable is  Medical Director for [facility] Cardiac and Pulmonary Rehab.

## 2018-02-19 ENCOUNTER — Encounter (INDEPENDENT_AMBULATORY_CARE_PROVIDER_SITE_OTHER): Payer: Self-pay | Admitting: Internal Medicine

## 2018-02-19 ENCOUNTER — Ambulatory Visit (INDEPENDENT_AMBULATORY_CARE_PROVIDER_SITE_OTHER): Payer: BLUE CROSS/BLUE SHIELD | Admitting: Internal Medicine

## 2018-02-19 ENCOUNTER — Ambulatory Visit (HOSPITAL_COMMUNITY): Payer: BLUE CROSS/BLUE SHIELD

## 2018-02-19 VITALS — BP 140/80 | HR 60 | Temp 97.7°F | Ht 69.0 in | Wt 142.7 lb

## 2018-02-19 DIAGNOSIS — B1710 Acute hepatitis C without hepatic coma: Secondary | ICD-10-CM

## 2018-02-19 DIAGNOSIS — C220 Liver cell carcinoma: Secondary | ICD-10-CM

## 2018-02-19 NOTE — Patient Instructions (Addendum)
Referral to IR Dr. Hassell Halim today.

## 2018-02-19 NOTE — Progress Notes (Signed)
Subjective:    Patient ID: Adam Bass, male    DOB: 06/04/1950, 67 y.o.   MRN: [id number]  HPI Here today to discuss lab and MRI results with Dr. Laural Golden Seen in [place] 02/02/2018 as a new patient for elevated liver enzymes. Liver enzymes had been elevated since 2014. Work up has revealed Hepatitis C and liver lesions.  Hx of Lung cancer with radiation and is followed by the [place] at [facility]. Never did IV drugs. Drinks 3-6 beers a day, but not every day. He continues to smoke.  Hx of MI in August with cardiac stent placement.  02/02/2018 Acute Hepatitis Panel Hepatitis C positive.  HCV [place] 2,320,000, 6.37. AFP 163.9  02/05/2018 US abdomen:  1. Two solid-appearing liver lesions worrisome for liver metastases.  02/17/2018 MRI abdomen w/wo CM: Evaluate liver lesions.  1. 2.6 cm lesion within lateral segment of left lobe of liver has enhancement characteristics (LI-RADS 4 lesion, probably HCC. 2. Three additional lesions within segment 8, segment 4B and segment three have enhancement characteristics compatible with LI-RADS 3 lesion, intermediate probability for [facility]. 3. Morphologic features of liver compatible with cirrhosis.    Review of Systems Past Medical History:  Diagnosis Date  . COPD (chronic obstructive pulmonary disease) ([place])    for 10 years or more   . History of radiation therapy 06/03/17- 06/09/17   Left Lung treated to 54 Gy with 3 fx of 18 Gy. SBRT  . Lung cancer ([place])   . Myocardial infarction ([place])   . Skin cancer of face 07/2015   skin cancer removed from face by Dr. Tarri Glenn in [place]    Past Surgical History:  Procedure Laterality Date  . COLONOSCOPY  2004   Dr. Truett Perna  . COLONOSCOPY N/A 08/27/2012   Procedure: COLONOSCOPY;  Surgeon: Rogene Houston, MD;  Location: [facility];  Service: Endoscopy;  Laterality: N/A;  1200  . CORONARY STENT INTERVENTION N/A 11/23/2017   Procedure: CORONARY STENT INTERVENTION;  Surgeon: Jettie Booze, MD;  Location: [facility];  Service: Cardiovascular;  Laterality: N/A;  . LEFT HEART CATH AND CORONARY ANGIOGRAPHY N/A 11/23/2017   Procedure: LEFT HEART CATH AND CORONARY ANGIOGRAPHY;  Surgeon: Jettie Booze, MD;  Location: [facility];  Service: Cardiovascular;  Laterality: N/A;    Allergies  Allergen Reactions  . Bee Venom Hives    Current Outpatient Medications on File Prior to Visit  Medication Sig Dispense Refill  . albuterol (PROAIR HFA) 108 (90 BASE) MCG/ACT inhaler Inhale 2 puffs into the lungs every 6 (six) hours as needed for wheezing.    Marland Kitchen aspirin EC 81 MG tablet Take 81 mg by mouth daily.    . bisoprolol (ZEBETA) 5 MG tablet Take 0.5 tablets (2.5 mg total) by mouth daily. 60 tablet 1  . Blood Pressure KIT 1 Units by Does not apply route daily. 1 each 0  . ezetimibe (ZETIA) 10 MG tablet Take 1 tablet (10 mg total) by mouth daily. 90 tablet 1  . fluticasone (FLONASE) 50 MCG/ACT nasal spray Place 1 spray into both nostrils daily.    Marland Kitchen LORazepam (ATIVAN) 0.5 MG tablet Take 1 tablet (0.5 mg total) by mouth every 6 (six) hours as needed for anxiety. 25 tablet 0  . Multiple Vitamins-Minerals (MULTIVITAMIN WITH MINERALS) tablet Take 1 tablet by mouth daily.    . nitroGLYCERIN (NITROSTAT) 0.4 MG SL tablet Place 1 tablet (0.4 mg total) under the tongue every 5 (five) minutes  as needed. 25 tablet 3  . Potassium 99 MG TABS Take 1 tablet by mouth daily.     . tamsulosin (FLOMAX) 0.4 MG CAPS capsule Take 0.4 mg by mouth daily.    . Tetrahydrozoline HCl (VISINE OP) Place 1 drop into both eyes daily as needed (Dry Eyes).    . ticagrelor (BRILINTA) 90 MG TABS tablet Take 1 tablet (90 mg total) by mouth 2 (two) times daily. 180 tablet 2  . TRELEGY ELLIPTA 100-62.5-25 MCG/INH AEPB INHALE 1 PUFF INTO THE LUNGS EVERY DAY 60 each 11  . vitamin E 400 UNIT capsule Take 1 tablet by mouth daily.      No current facility-administered medications on file prior to visit.          Objective:   Physical Exam Blood pressure 140/80, pulse 60, temperature 97.7 F (36.5 C), height _0  (1.753 m), weight 142 lb 11.2 oz (64.7 kg).   Physical deferred.     Assessment & Plan:  Hepatic carcinoma.  Hepatitis C. Will get a PT/INRHep C genotype, HIV. Hep B e surface antigen

## 2018-02-19 NOTE — Progress Notes (Signed)
Cardiac Individual Treatment Plan  Patient Details  Name: Adam Bass MRN: [id number] Date of Birth: 14-Nov-1950 Referring Provider:     CARDIAC REHAB PHASE II ORIENTATION from 01/05/2018 in [facility]  Referring Provider  [name]      Initial Encounter Date:    CARDIAC REHAB PHASE II ORIENTATION from 01/05/2018 in [facility]  Date  01/05/18      Visit Diagnosis: ST elevation myocardial infarction (STEMI) involving other coronary artery of inferior wall (HCC)  Status post coronary artery stent placement  Patient's Home Medications on Admission:  Current Outpatient Medications:  .  albuterol (PROAIR HFA) 108 (90 BASE) MCG/ACT inhaler, Inhale 2 puffs into the lungs every 6 (six) hours as needed for wheezing., Disp: , Rfl:  .  aspirin EC 81 MG tablet, Take 81 mg by mouth daily., Disp: , Rfl:  .  bisoprolol (ZEBETA) 5 MG tablet, Take 0.5 tablets (2.5 mg total) by mouth daily., Disp: 60 tablet, Rfl: 1 .  Blood Pressure KIT, 1 Units by Does not apply route daily., Disp: 1 each, Rfl: 0 .  ezetimibe (ZETIA) 10 MG tablet, Take 1 tablet (10 mg total) by mouth daily., Disp: 90 tablet, Rfl: 1 .  fluticasone (FLONASE) 50 MCG/ACT nasal spray, Place 1 spray into both nostrils daily., Disp: , Rfl:  .  LORazepam (ATIVAN) 0.5 MG tablet, Take 1 tablet (0.5 mg total) by mouth every 6 (six) hours as needed for anxiety., Disp: 25 tablet, Rfl: 0 .  Multiple Vitamins-Minerals (MULTIVITAMIN WITH MINERALS) tablet, Take 1 tablet by mouth daily., Disp: , Rfl:  .  nitroGLYCERIN (NITROSTAT) 0.4 MG SL tablet, Place 1 tablet (0.4 mg total) under the tongue every 5 (five) minutes as needed., Disp: 25 tablet, Rfl: 3 .  Potassium 99 MG TABS, Take 1 tablet by mouth daily. , Disp: , Rfl:  .  tamsulosin (FLOMAX) 0.4 MG CAPS capsule, Take 0.4 mg by mouth daily., Disp: , Rfl:  .  Tetrahydrozoline HCl (VISINE OP), Place 1 drop into both eyes daily as needed (Dry Eyes).,  Disp: , Rfl:  .  ticagrelor (BRILINTA) 90 MG TABS tablet, Take 1 tablet (90 mg total) by mouth 2 (two) times daily., Disp: 180 tablet, Rfl: 2 .  TRELEGY ELLIPTA 100-62.5-25 MCG/INH AEPB, INHALE 1 PUFF INTO THE LUNGS EVERY DAY, Disp: 60 each, Rfl: 11 .  vitamin E 400 UNIT capsule, Take 1 tablet by mouth daily. , Disp: , Rfl:   Past Medical History: Past Medical History:  Diagnosis Date  . COPD (chronic obstructive pulmonary disease) ([place])    for 10 years or more   . History of radiation therapy 06/03/17- 06/09/17   Left Lung treated to 54 Gy with 3 fx of 18 Gy. SBRT  . Lung cancer ([place])   . Myocardial infarction ([place])   . Skin cancer of face 07/2015   skin cancer removed from face by Dr. Tarri Glenn in [place]    Tobacco Use: Social History   Tobacco Use  Smoking Status Current Every Day Smoker  . Packs/day: 0.75  . Years: 50.00  . Pack years: 37.50  . Types: Cigars  Smokeless Tobacco Never Used  Tobacco Comment   he is wearing a patch. He is smoking about 5-10 cigarettes daily.     Labs: Recent Review Flowsheet Data    Labs for ITP Cardiac and Pulmonary Rehab Latest Ref Rng & Units 05/06/2017 11/23/2017 11/24/2017   Cholestrol 0 - 200 mg/dL - 146 140  LDLCALC 0 - 99 mg/dL - 100(H) 84   HDL >40 mg/dL - 35(L) 33(L)   Trlycerides <150 mg/dL - 53 116   Hemoglobin A1c 4.8 - 5.6 % - - 5.0   PHART 7.350 - 7.450 7.444 - -   PCO2ART 32.0 - 48.0 mmHg 38.9 - -   HCO3 20.0 - 28.0 mmol/L 26.7 - -   TCO2 22 - 32 mmol/L - 23 -   O2SAT % 95.9 - -      Capillary Blood Glucose: Lab Results  Component Value Date   GLUCAP 104 (H) 04/02/2017   GLUCAP 112 (H) 10/02/2016     Exercise Target Goals: Exercise Program Goal: Individual exercise prescription set using results from initial 6 min walk test and THRR while considering  patient's activity barriers and safety.   Exercise Prescription Goal: Starting with aerobic activity 30 plus minutes a day, 3 days per week for initial exercise  prescription. Provide home exercise prescription and guidelines that participant acknowledges understanding prior to discharge.  Activity Barriers & Risk Stratification: Activity Barriers & Cardiac Risk Stratification - 01/05/18 1029      Activity Barriers & Cardiac Risk Stratification   Activity Barriers  Joint Problems;Deconditioning;Muscular Weakness;Shortness of Breath    Cardiac Risk Stratification  High       6 Minute Walk: 6 Minute Walk    Row Name 01/05/18 1028         6 Minute Walk   Phase  Initial     Distance  1000 feet     Walk Time  6 minutes     # of Rest Breaks  0     MPH  1.89     METS  2.45     RPE  13     Perceived Dyspnea   13     VO2 Peak  10.25     Symptoms  Yes (comment)     Comments  4/10 bilateral hip pain      Resting HR  64 bpm     Resting BP  110/60     Resting Oxygen Saturation   95 %     Exercise Oxygen Saturation  during 6 min walk  92 %     Max Ex. HR  91 bpm     Max Ex. BP  138/68     2 Minute Post BP  114/66        Oxygen Initial Assessment:   Oxygen Re-Evaluation:   Oxygen Discharge (Final Oxygen Re-Evaluation):   Initial Exercise Prescription: Initial Exercise Prescription - 01/05/18 1000      Date of Initial Exercise RX and Referring Provider   Date  01/05/18    Referring Provider  [facility]    Expected Discharge Date  04/06/18      NuStep   Level  1    SPM  55    Minutes  17    METs  1.9      Arm Ergometer   Level  1.5    Watts  11    RPM  41    Minutes  17    METs  1.7      Prescription Details   Frequency (times per week)  3    Duration  Progress to 30 minutes of continuous aerobic without signs/symptoms of physical distress      Intensity   THRR 40-80% of Max Heartrate  [phone]    Ratings of Perceived Exertion  11-13    Perceived  Dyspnea  0-4      Progression   Progression  Continue progressive overload as per policy without signs/symptoms or physical distress.      Resistance Training    Training Prescription  Yes    Weight  1    Reps  10-15       Perform Capillary Blood Glucose checks as needed.  Exercise Prescription Changes:  Exercise Prescription Changes    Row Name 01/05/18 1000 02/03/18 1100           Response to Exercise   Blood Pressure (Admit)  -  112/58      Blood Pressure (Exercise)  -  132/60      Blood Pressure (Exit)  -  116/60      Heart Rate (Admit)  -  59 bpm      Heart Rate (Exercise)  -  72 bpm      Heart Rate (Exit)  -  67 bpm      Rating of Perceived Exertion (Exercise)  -  11      Duration  -  Progress to 30 minutes of  aerobic without signs/symptoms of physical distress      Intensity  -  THRR New [phone]        Progression   Progression  -  Continue to progress workloads to maintain intensity without signs/symptoms of physical distress.      Average METs  -  1.9        Resistance Training   Training Prescription  -  Yes      Weight  -  2      Reps  -  10-15        NuStep   Level  -  2      SPM  -  66      Minutes  -  22      METs  -  1.7        Arm Ergometer   Level  -  2      Watts  -  11      RPM  -  44      Minutes  -  17      METs  -  2.1        Home Exercise Plan   Plans to continue exercise at  Home (comment) walking   Home (comment)      Frequency  Add 2 additional days to program exercise sessions.  Add 2 additional days to program exercise sessions.      Initial Home Exercises Provided  01/05/18  01/05/18         Exercise Comments:  Exercise Comments    Row Name 01/19/18 1518 02/18/18 1430         Exercise Comments  Patient has done well in the program so far. He wants to get strong enough to return to work.   Patient feels he has gotten stronger. He is still hoping this helps him make it back to work.          Exercise Goals and Review:  Exercise Goals    Row Name 01/05/18 1041             Exercise Goals   Increase Physical Activity  Yes       Intervention  Provide advice, education,  support and counseling about physical activity/exercise needs.;Develop an individualized exercise prescription for aerobic and resistive training based on initial evaluation findings, risk stratification,  comorbidities and participant's personal goals.       Expected Outcomes  Short Term: Attend rehab on a regular basis to increase amount of physical activity.       Increase Strength and Stamina  Yes       Intervention  Provide advice, education, support and counseling about physical activity/exercise needs.;Develop an individualized exercise prescription for aerobic and resistive training based on initial evaluation findings, risk stratification, comorbidities and participant's personal goals.       Expected Outcomes  Short Term: Increase workloads from initial exercise prescription for resistance, speed, and METs.;Short Term: Perform resistance training exercises routinely during rehab and add in resistance training at home;Long Term: Improve cardiorespiratory fitness, muscular endurance and strength as measured by increased METs and functional capacity (6MWT)       Able to understand and use rate of perceived exertion (RPE) scale  Yes       Intervention  Provide education and explanation on how to use RPE scale       Expected Outcomes  Short Term: Able to use RPE daily in rehab to express subjective intensity level;Long Term:  Able to use RPE to guide intensity level when exercising independently       Able to understand and use Dyspnea scale  Yes       Intervention  Provide education and explanation on how to use Dyspnea scale       Expected Outcomes  Short Term: Able to use Dyspnea scale daily in rehab to express subjective sense of shortness of breath during exertion;Long Term: Able to use Dyspnea scale to guide intensity level when exercising independently       Knowledge and understanding of Target Heart Rate Range (THRR)  Yes       Intervention  Provide education and explanation of THRR  including how the numbers were predicted and where they are located for reference       Expected Outcomes  Short Term: Able to state/look up THRR;Long Term: Able to use THRR to govern intensity when exercising independently;Short Term: Able to use daily as guideline for intensity in rehab       Able to check pulse independently  Yes       Intervention  Provide education and demonstration on how to check pulse in carotid and radial arteries.;Review the importance of being able to check your own pulse for safety during independent exercise       Expected Outcomes  Short Term: Able to explain why pulse checking is important during independent exercise;Long Term: Able to check pulse independently and accurately       Understanding of Exercise Prescription  Yes       Intervention  Provide education, explanation, and written materials on patient's individual exercise prescription       Expected Outcomes  Short Term: Able to explain program exercise prescription;Long Term: Able to explain home exercise prescription to exercise independently          Exercise Goals Re-Evaluation : Exercise Goals Re-Evaluation    Row Name 01/19/18 1516 02/18/18 1428           Exercise Goal Re-Evaluation   Exercise Goals Review  Increase Physical Activity;Increase Strength and Stamina;Able to understand and use rate of perceived exertion (RPE) scale;Knowledge and understanding of Target Heart Rate Range (THRR);Understanding of Exercise Prescription;Able to understand and use Dyspnea scale;Able to check pulse independently  Increase Physical Activity;Increase Strength and Stamina;Able to understand and use rate of perceived exertion (RPE) scale;Knowledge  and understanding of Target Heart Rate Range (THRR);Understanding of Exercise Prescription;Able to understand and use Dyspnea scale;Able to check pulse independently      Comments  Patient is new to the program. He has completed 6 sessions. He has tolerated exercise well.  We will continue to monitor him and progress as tolerated.   Patient has attended 15 sessions now. He reports feeling stronger and feels this is helping him to be able to do more around the house. He is progressing right on track.       Expected Outcomes  Increase strength, learn to eat healthier.   Increase strength, continue to make healthier eating choices.           Discharge Exercise Prescription (Final Exercise Prescription Changes): Exercise Prescription Changes - 02/03/18 1100      Response to Exercise   Blood Pressure (Admit)  112/58    Blood Pressure (Exercise)  132/60    Blood Pressure (Exit)  116/60    Heart Rate (Admit)  59 bpm    Heart Rate (Exercise)  72 bpm    Heart Rate (Exit)  67 bpm    Rating of Perceived Exertion (Exercise)  11    Duration  Progress to 30 minutes of  aerobic without signs/symptoms of physical distress    Intensity  THRR New   [phone]     Progression   Progression  Continue to progress workloads to maintain intensity without signs/symptoms of physical distress.    Average METs  1.9      Resistance Training   Training Prescription  Yes    Weight  2    Reps  10-15      NuStep   Level  2    SPM  66    Minutes  22    METs  1.7      Arm Ergometer   Level  2    Watts  11    RPM  44    Minutes  17    METs  2.1      Home Exercise Plan   Plans to continue exercise at  Home (comment)    Frequency  Add 2 additional days to program exercise sessions.    Initial Home Exercises Provided  01/05/18       Nutrition:  Target Goals: Understanding of nutrition guidelines, daily intake of sodium <1552m, cholesterol <2028m calories 30% from fat and 7% or less from saturated fats, daily to have 5 or more servings of fruits and vegetables.  Biometrics: Pre Biometrics - 01/05/18 1041      Pre Biometrics   Height  5' 9"  (1.753 m)    Weight  64.9 kg    Waist Circumference  30.25 inches    Hip Circumference  32.5 inches    Waist to Hip Ratio   0.93 %    BMI (Calculated)  21.12    Triceps Skinfold  3 mm    % Body Fat  14.2 %    Grip Strength  19.4 kg    Flexibility  16.3 in    Single Leg Stand  5 seconds        Nutrition Therapy Plan and Nutrition Goals: Nutrition Therapy & Goals - 01/15/18 1553      Personal Nutrition Goals   Nutrition Goal  For heart healthy choices add >50% of whole grains, make half their plate fruits and vegetables. Discuss the difference between starchy vegetables and leafy greens, and how leafy vegetables provide fiber,  helps maintain healthy weight, helps control blood glucose, and lowers cholesterol.  Discuss purchasing fresh or frozen vegetable to reduce sodium and not to add grease, fat or sugar. Consume <18oz of red meat per week. Consume lean cuts of meats and very little of meats high in sodium and nitrates such as pork and lunch meats. Discussed portion control for all food groups.      Comments  Patient met with RD 01/15/18.      Intervention Plan   Intervention  Nutrition handout(s) given to patient.    Expected Outcomes  Short Term Goal: Understand basic principles of dietary content, such as calories, fat, sodium, cholesterol and nutrients.       Nutrition Assessments: Nutrition Assessments - 01/05/18 0942      MEDFICTS Scores   Pre Score  50       Nutrition Goals Re-Evaluation: Nutrition Goals Re-Evaluation    [place] Name 01/21/18 1510 02/19/18 0734           Goals   Current Weight  142 lb (64.4 kg)  139 lb 14.4 oz (63.5 kg)      Nutrition Goal  For heart healthy choices add >50% of whole grains, make half their plate fruits and vegetables. Discuss the difference between starchy vegetables and leafy greens, and how leafy vegetables provide fiber, helps maintain healthy weight, helps control blood glucose, and lowers cholesterol.  Discuss purchasing fresh or frozen vegetable to reduce sodium and not to add grease, fat or sugar. Consume <18oz of red meat per week. Consume lean cuts of  meats and very little of meats high in sodium and nitrates such as pork and lunch meats. Discussed portion control for all food groups.    For heart healthy choices add >50% of whole grains, make half their plate fruits and vegetables. Discuss the difference between starchy vegetables and leafy greens, and how leafy vegetables provide fiber, helps maintain healthy weight, helps control blood glucose, and lowers cholesterol.  Discuss purchasing fresh or frozen vegetable to reduce sodium and not to add grease, fat or sugar. Consume <18oz of red meat per week. Consume lean cuts of meats and very little of meats high in sodium and nitrates such as pork and lunch meats. Discussed portion control for all food groups.        Comment  Patient has lost 1 lb since his orientation visit. He says he is trying to eat heart healthy. Will continue to monitor for progress.   Patient has lost 2 lbs since last 30 day review. He continues to say he is trying to eat heart healthy.       Expected Outcome  Patient will continue to work toward meeting his nutritional goals.   Patient will continue to work toward meeting his nutritional goals.          Nutrition Goals Discharge (Final Nutrition Goals Re-Evaluation): Nutrition Goals Re-Evaluation - 02/19/18 0734      Goals   Current Weight  139 lb 14.4 oz (63.5 kg)    Nutrition Goal  For heart healthy choices add >50% of whole grains, make half their plate fruits and vegetables. Discuss the difference between starchy vegetables and leafy greens, and how leafy vegetables provide fiber, helps maintain healthy weight, helps control blood glucose, and lowers cholesterol.  Discuss purchasing fresh or frozen vegetable to reduce sodium and not to add grease, fat or sugar. Consume <18oz of red meat per week. Consume lean cuts of meats and very little of meats  high in sodium and nitrates such as pork and lunch meats. Discussed portion control for all food groups.      Comment  Patient  has lost 2 lbs since last 30 day review. He continues to say he is trying to eat heart healthy.     Expected Outcome  Patient will continue to work toward meeting his nutritional goals.        Psychosocial: Target Goals: Acknowledge presence or absence of significant depression and/or stress, maximize coping skills, provide positive support system. Participant is able to verbalize types and ability to use techniques and skills needed for reducing stress and depression.  Initial Review & Psychosocial Screening: Initial Psych Review & Screening - 01/05/18 1032      Initial Review   Current issues with  None Identified      Family Dynamics   Good Support System?  Yes    Comments  Patient has good family support from his daughters and his ex-wife and from friends.       Barriers   Psychosocial barriers to participate in program  There are no identifiable barriers or psychosocial needs.      Screening Interventions   Interventions  Encouraged to exercise;Provide feedback about the scores to participant    Expected Outcomes  Long Term goal: The participant improves quality of Life and PHQ9 Scores as seen by post scores and/or verbalization of changes       Quality of Life Scores: Quality of Life - 01/05/18 1043      Quality of Life   Select  Quality of Life      Quality of Life Scores   Health/Function Pre  15.53 %    Socioeconomic Pre  24 %    Psych/Spiritual Pre  24.86 %    Family Pre  27 %    GLOBAL Pre  20.7 %      Scores of 19 and below usually indicate a poorer quality of life in these areas.  A difference of  2-3 points is a clinically meaningful difference.  A difference of 2-3 points in the total score of the Quality of Life Index has been associated with significant improvement in overall quality of life, self-image, physical symptoms, and general health in studies assessing change in quality of life.  PHQ-9: Recent Review Flowsheet Data    Depression screen [facility] 2/9  01/05/2018 09/12/2017 07/04/2017 04/30/2017   Decreased Interest 0 0 0 0   Down, Depressed, Hopeless 0 0 0 0   PHQ - 2 Score 0 0 0 0   Altered sleeping 1 - - -   Tired, decreased energy 1 - - -   Change in appetite 1 - - -   Feeling bad or failure about yourself  0 - - -   Trouble concentrating 0 - - -   Moving slowly or fidgety/restless 1 - - -   Suicidal thoughts 0 - - -   PHQ-9 Score 4 - - -   Difficult doing work/chores Somewhat difficult - - -     Interpretation of Total Score  Total Score Depression Severity:  1-4 = Minimal depression, 5-9 = Mild depression, 10-14 = Moderate depression, 15-19 = Moderately severe depression, 20-27 = Severe depression   Psychosocial Evaluation and Intervention: Psychosocial Evaluation - 01/05/18 1034      Psychosocial Evaluation & Interventions   Interventions  Encouraged to exercise with the program and follow exercise prescription;Stress management education;Relaxation education    Comments  Patient's  initial QOL score was 20.70 and his PHQ_9 score was 4 with no psychosocial issues identified. He does have difficulty falling asleep since his procedure. His PCP prescribed Lorazepam 0.5 mg as needed for sleep.     Expected Outcomes  Patient will have no psychosocial issues identified at discharge. His QOL and PHQ-9 scores will improve at discharge.     Continue Psychosocial Services   No Follow up required       Psychosocial Re-Evaluation: Psychosocial Re-Evaluation    [place] Name 01/21/18 1516 02/19/18 0741           Psychosocial Re-Evaluation   Current issues with  None Identified  None Identified      Comments  Patient's initial QOL score was 20.70 and his PHQ-9 score was 4 with no issues psychosocial issues identified.  Patient's initial QOL score was 20.70 and his PHQ-9 score was 4 with no issues psychosocial issues identified.      Expected Outcomes  Patient will have no psychosocial issues identified at discharge.   Patient will have no  psychosocial issues identified at discharge.       Interventions  Relaxation education;Stress management education;Encouraged to attend Cardiac Rehabilitation for the exercise  Relaxation education;Stress management education;Encouraged to attend Cardiac Rehabilitation for the exercise      Continue Psychosocial Services   No Follow up required  No Follow up required         Psychosocial Discharge (Final Psychosocial Re-Evaluation): Psychosocial Re-Evaluation - 02/19/18 0741      Psychosocial Re-Evaluation   Current issues with  None Identified    Comments  Patient's initial QOL score was 20.70 and his PHQ-9 score was 4 with no issues psychosocial issues identified.    Expected Outcomes  Patient will have no psychosocial issues identified at discharge.     Interventions  Relaxation education;Stress management education;Encouraged to attend Cardiac Rehabilitation for the exercise    Continue Psychosocial Services   No Follow up required       Vocational Rehabilitation: Provide vocational rehab assistance to qualifying candidates.   Vocational Rehab Evaluation & Intervention: Vocational Rehab - 01/05/18 0943      Initial Vocational Rehab Evaluation & Intervention   Assessment shows need for Vocational Rehabilitation  No       Education: Education Goals: Education classes will be provided on a weekly basis, covering required topics. Participant will state understanding/return demonstration of topics presented.  Learning Barriers/Preferences: Learning Barriers/Preferences - 01/05/18 0944      Learning Barriers/Preferences   Learning Barriers  None    Learning Preferences  Written Material;Verbal Instruction;Skilled Demonstration       Education Topics: Hypertension, Hypertension Reduction -Define heart disease and high blood pressure. Discus how high blood pressure affects the body and ways to reduce high blood pressure.   CARDIAC REHAB PHASE II EXERCISE from 02/18/2018 in  [place]  Date  02/18/18  Educator  D. Coad  Instruction Review Code  2- Demonstrated Understanding      Exercise and Your Heart -Discuss why it is important to exercise, the FITT principles of exercise, normal and abnormal responses to exercise, and how to exercise safely.   Angina -Discuss definition of angina, causes of angina, treatment of angina, and how to decrease risk of having angina.   Cardiac Medications -Review what the following cardiac medications are used for, how they affect the body, and side effects that may occur when taking the medications.  Medications include Aspirin, Beta blockers, calcium channel  blockers, ACE Inhibitors, angiotensin receptor blockers, diuretics, digoxin, and antihyperlipidemics.   Congestive Heart Failure -Discuss the definition of CHF, how to live with CHF, the signs and symptoms of CHF, and how keep track of weight and sodium intake.   Heart Disease and Intimacy -Discus the effect sexual activity has on the heart, how changes occur during intimacy as we age, and safety during sexual activity.   Smoking Cessation / COPD -Discuss different methods to quit smoking, the health benefits of quitting smoking, and the definition of COPD.   Nutrition I: Fats -Discuss the types of cholesterol, what cholesterol does to the heart, and how cholesterol levels can be controlled.   CARDIAC REHAB PHASE II EXERCISE from 02/18/2018 in [place]  Date  01/07/18  Educator  D. Coad  Instruction Review Code  2- Demonstrated Understanding      Nutrition II: Labels -Discuss the different components of food labels and how to read food label   [place] from 02/18/2018 in [place]  Date  01/14/18  Educator  Etheleen Mayhew  Instruction Review Code  2- Demonstrated Understanding      Heart Parts/Heart Disease and PAD -Discuss the anatomy of the heart, the pathway  of blood circulation through the heart, and these are affected by heart disease.   CARDIAC REHAB PHASE II EXERCISE from 02/18/2018 in [place]  Date  01/21/18  Educator  Etheleen Mayhew  Instruction Review Code  2- Demonstrated Understanding      Stress I: Signs and Symptoms -Discuss the causes of stress, how stress may lead to anxiety and depression, and ways to limit stress.   CARDIAC REHAB PHASE II EXERCISE from 02/18/2018 in [place]  Date  01/28/18  Educator  D. Coad  Instruction Review Code  2- Demonstrated Understanding      Stress II: Relaxation -Discuss different types of relaxation techniques to limit stress.   CARDIAC REHAB PHASE II EXERCISE from 02/18/2018 in [place]  Date  02/04/18  Educator  D. Coad  Instruction Review Code  2- Demonstrated Understanding      Warning Signs of Stroke / TIA -Discuss definition of a stroke, what the signs and symptoms are of a stroke, and how to identify when someone is having stroke.   Knowledge Questionnaire Score: Knowledge Questionnaire Score - 01/05/18 0942      Knowledge Questionnaire Score   Pre Score  25/28       Core Components/Risk Factors/Patient Goals at Admission: Personal Goals and Risk Factors at Admission - 01/05/18 0944      Core Components/Risk Factors/Patient Goals on Admission    Weight Management  Weight Maintenance    Tobacco Cessation  Yes    Intervention  Assist the participant in steps to quit. Provide individualized education and counseling about committing to Tobacco Cessation, relapse prevention, and pharmacological support that can be provided by physician.;Advice worker, assist with locating and accessing local/national Quit Smoking programs, and support quit date choice.    Expected Outcomes  Short Term: Will demonstrate readiness to quit, by selecting a quit date.    Personal Goal Other  Yes    Personal Goal   Learn more about heart heatlhy eating; increase strength; get strong enough to get back to work. Quit smoking or reduce his smoking.     Intervention  Patient will attend CR 3 days/week and supplement with exercise 2 days/week at home by walking.  Expected Outcomes  Patient will meet his personal goals.        Core Components/Risk Factors/Patient Goals Review:  Goals and Risk Factor Review    Row Name 01/21/18 1511 02/19/18 0734           Core Components/Risk Factors/Patient Goals Review   Personal Goals Review  Tobacco Cessation;Weight Management/Obesity Learn how to eat healthier; increase strength; quit smoking or decrease smoking. Get back to work.   Tobacco Cessation;Weight Management/Obesity Learn how to eat healthier; increase strength; quit smoking or reduce smoking; get back to wrok.       Review  Patient has completed 7 sessions losing one lb since his orientation visit. He is doing well in the program with some progression. He did meet with RD and said he learned some tips about eating healthy. He continues to say he is not ready to quit smoking yet and says he is not interested in a smoking cessation class. Will continue to encourage patient to quit smoking.   Patient has completed 15 sessions losing 2 lbs since last 30 day review. He continues to do well in the program with progression. He says he is learning how to eat healthier. He does feel stronger but has not returned back to work. He has not quit smoking and continues to say he is not interested in a smoking cessation class. He does not cough as much during exercise as when he started the program. Will continue to monitor for progress.       Expected Outcomes  Patient will continue to attend sessions and complete the program meeting his personal goals.   Patient will continue to attend sessions and complete the program meeting his personal goals.          Core Components/Risk Factors/Patient Goals at Discharge (Final  Review):  Goals and Risk Factor Review - 02/19/18 0734      Core Components/Risk Factors/Patient Goals Review   Personal Goals Review  Tobacco Cessation;Weight Management/Obesity   Learn how to eat healthier; increase strength; quit smoking or reduce smoking; get back to wrok.    Review  Patient has completed 15 sessions losing 2 lbs since last 30 day review. He continues to do well in the program with progression. He says he is learning how to eat healthier. He does feel stronger but has not returned back to work. He has not quit smoking and continues to say he is not interested in a smoking cessation class. He does not cough as much during exercise as when he started the program. Will continue to monitor for progress.     Expected Outcomes  Patient will continue to attend sessions and complete the program meeting his personal goals.        ITP Comments: ITP Comments    Row Name 01/05/18 0927           ITP Comments  Patient is a 67 yr old male referred by Dr. 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 He is currenlty smoking 3/4 ppd. He is interested iin quiting but not actively trying to quit. He has no barriers to CR. He has COPD treated with inhalers.           Comments: ITP REVIEW Patient doing well in the program. Will continue to monitor for progress.

## 2018-02-20 ENCOUNTER — Encounter (HOSPITAL_COMMUNITY)
Admission: RE | Admit: 2018-02-20 | Discharge: 2018-02-20 | Disposition: A | Discharge status: Home / Self Care | Payer: BLUE CROSS/BLUE SHIELD | Source: Ambulatory Visit | Attending: Interventional Cardiology | Admitting: Interventional Cardiology

## 2018-02-20 DIAGNOSIS — I2119 ST elevation (STEMI) myocardial infarction involving other coronary artery of inferior wall: Secondary | ICD-10-CM

## 2018-02-20 DIAGNOSIS — B1710 Acute hepatitis C without hepatic coma: Secondary | ICD-10-CM | POA: Diagnosis not present

## 2018-02-20 DIAGNOSIS — Z955 Presence of coronary angioplasty implant and graft: Secondary | ICD-10-CM | POA: Diagnosis not present

## 2018-02-20 NOTE — Progress Notes (Signed)
Daily Session Note  Patient Details  Name: Adam Bass MRN: [id number] Date of Birth: January 19, 1951 Referring Provider:     CARDIAC REHAB PHASE II ORIENTATION from 01/05/2018 in [place]  Referring Provider  [name]      Encounter Date: 02/20/2018  Check In: Session Check In - 02/20/18 0930      Check-In   Supervising physician immediately available to respond to emergencies  See telemetry face sheet for immediately available ER MD    Location  [facility]    Staff Present  Benay Pike, Exercise Physiologist;Elanore Talcott Wynetta Emery, RN, BSN    Medication changes reported      No    Fall or balance concerns reported     No    Tobacco Cessation  No Change    Warm-up and Cool-down  Performed as group-led instruction    Resistance Training Performed  Yes    VAD Patient?  No    PAD/SET Patient?  No      Pain Assessment   Currently in Pain?  No/denies    Pain Score  0-No pain    Multiple Pain Sites  No       Capillary Blood Glucose: No results found for this or any previous visit (from the past 24 hour(s)).    Social History   Tobacco Use  Smoking Status Current Every Day Smoker  . Packs/day: 0.75  . Years: 50.00  . Pack years: 37.50  . Types: Cigars  Smokeless Tobacco Never Used  Tobacco Comment   he is wearing a patch. He is smoking about 5-10 cigarettes daily.     Goals Met:  Independence with exercise equipment Exercise tolerated well No report of cardiac concerns or symptoms Strength training completed today  Goals Unmet:  Not Applicable  Comments: Pt able to follow exercise prescription today without complaint.  Will continue to monitor for progression. Check out 1030.   Dr. Kate Sable is Medical Director for [facility] and Pulmonary Rehab.

## 2018-02-23 ENCOUNTER — Encounter (HOSPITAL_COMMUNITY): Payer: BLUE CROSS/BLUE SHIELD

## 2018-02-25 ENCOUNTER — Other Ambulatory Visit (INDEPENDENT_AMBULATORY_CARE_PROVIDER_SITE_OTHER): Payer: Self-pay | Admitting: Internal Medicine

## 2018-02-25 ENCOUNTER — Encounter (HOSPITAL_COMMUNITY)
Admission: RE | Admit: 2018-02-25 | Discharge: 2018-02-25 | Disposition: A | Discharge status: Home / Self Care | Payer: BLUE CROSS/BLUE SHIELD | Source: Ambulatory Visit | Attending: Interventional Cardiology | Admitting: Interventional Cardiology

## 2018-02-25 DIAGNOSIS — Z955 Presence of coronary angioplasty implant and graft: Secondary | ICD-10-CM

## 2018-02-25 DIAGNOSIS — I2119 ST elevation (STEMI) myocardial infarction involving other coronary artery of inferior wall: Secondary | ICD-10-CM

## 2018-02-25 DIAGNOSIS — C220 Liver cell carcinoma: Secondary | ICD-10-CM

## 2018-02-25 LAB — HIV ANTIBODY (ROUTINE TESTING W REFLEX): HIV: NONREACTIVE

## 2018-02-25 LAB — PROTIME-INR
INR: 1.1
PROTHROMBIN TIME: 11.2 s (ref 9.0–11.5)

## 2018-02-25 LAB — HEPATITIS C GENOTYPE

## 2018-02-25 LAB — HEPATITIS B E ANTIGEN: Hep B E Ag: NONREACTIVE

## 2018-02-25 NOTE — Progress Notes (Signed)
Daily Session Note  Patient Details  Name: Adam Bass MRN: [id number] Date of Birth: Jun 09, 1950 Referring Provider:     CARDIAC REHAB PHASE II ORIENTATION from 01/05/2018 in [place]  Referring Provider  [name]      Encounter Date: 02/25/2018  Check In: Session Check In - 02/25/18 1020      Check-In   Supervising physician immediately available to respond to emergencies  See telemetry face sheet for immediately available ER MD    Location  [facility]    Staff Present  Russella Dar, MS, EP, [facility], Exercise Physiologist;Debra Wynetta Emery, RN, BSN    Medication changes reported      No    Fall or balance concerns reported     No    Tobacco Cessation  No Change    Warm-up and Cool-down  Performed as group-led instruction    Resistance Training Performed  Yes    VAD Patient?  No    PAD/SET Patient?  No      Pain Assessment   Currently in Pain?  No/denies    Pain Score  0-No pain    Multiple Pain Sites  No       Capillary Blood Glucose: No results found for this or any previous visit (from the past 24 hour(s)).    Social History   Tobacco Use  Smoking Status Current Every Day Smoker  . Packs/day: 0.75  . Years: 50.00  . Pack years: 37.50  . Types: Cigars  Smokeless Tobacco Never Used  Tobacco Comment   he is wearing a patch. He is smoking about 5-10 cigarettes daily.     Goals Met:  Independence with exercise equipment Exercise tolerated well Personal goals reviewed No report of cardiac concerns or symptoms Strength training completed today  Goals Unmet:  Not Applicable  Comments: Check out: 1030   Dr. Kate Sable is Medical Director for [place] and Pulmonary Rehab.

## 2018-02-27 ENCOUNTER — Encounter (HOSPITAL_COMMUNITY)
Admission: RE | Admit: 2018-02-27 | Discharge: 2018-02-27 | Disposition: A | Discharge status: Home / Self Care | Payer: BLUE CROSS/BLUE SHIELD | Source: Ambulatory Visit | Attending: Interventional Cardiology | Admitting: Interventional Cardiology

## 2018-02-27 DIAGNOSIS — Z955 Presence of coronary angioplasty implant and graft: Secondary | ICD-10-CM | POA: Diagnosis not present

## 2018-02-27 DIAGNOSIS — I2119 ST elevation (STEMI) myocardial infarction involving other coronary artery of inferior wall: Secondary | ICD-10-CM | POA: Diagnosis not present

## 2018-02-27 NOTE — Progress Notes (Signed)
Daily Session Note  Patient Details  Name: Adam Bass MRN: [id number] Date of Birth: Jun 27, 1950 Referring Provider:     CARDIAC REHAB PHASE II ORIENTATION from 01/05/2018 in [place]  Referring Provider  [name]      Encounter Date: 02/27/2018  Check In: Session Check In - 02/27/18 0930      Check-In   Supervising physician immediately available to respond to emergencies  See telemetry face sheet for immediately available ER MD    Location  [facility]    Staff Present  Russella Dar, MS, EP, [facility], Exercise Physiologist;Quintana Canelo Wynetta Emery, RN, Cory Munch, Exercise Physiologist    Medication changes reported      No    Fall or balance concerns reported     No    Tobacco Cessation  No Change    Warm-up and Cool-down  Performed as group-led instruction    Resistance Training Performed  Yes    VAD Patient?  No    PAD/SET Patient?  No      Pain Assessment   Currently in Pain?  No/denies    Pain Score  0-No pain    Multiple Pain Sites  No       Capillary Blood Glucose: No results found for this or any previous visit (from the past 24 hour(s)).    Social History   Tobacco Use  Smoking Status Current Every Day Smoker  . Packs/day: 0.75  . Years: 50.00  . Pack years: 37.50  . Types: Cigars  Smokeless Tobacco Never Used  Tobacco Comment   he is wearing a patch. He is smoking about 5-10 cigarettes daily.     Goals Met:  Independence with exercise equipment Exercise tolerated well No report of cardiac concerns or symptoms Strength training completed today  Goals Unmet:  Not Applicable  Comments: Pt able to follow exercise prescription today without complaint.  Will continue to monitor for progression. Check out 1030.   Dr. Kate Sable is Medical Director for [facility] Cardiac and Pulmonary Rehab.

## 2018-03-02 ENCOUNTER — Encounter (HOSPITAL_COMMUNITY)
Admission: RE | Admit: 2018-03-02 | Discharge: 2018-03-02 | Disposition: A | Discharge status: Home / Self Care | Payer: BLUE CROSS/BLUE SHIELD | Source: Ambulatory Visit | Attending: Interventional Cardiology | Admitting: Interventional Cardiology

## 2018-03-02 DIAGNOSIS — I2119 ST elevation (STEMI) myocardial infarction involving other coronary artery of inferior wall: Secondary | ICD-10-CM

## 2018-03-02 DIAGNOSIS — Z955 Presence of coronary angioplasty implant and graft: Secondary | ICD-10-CM

## 2018-03-02 NOTE — Progress Notes (Addendum)
Daily Session Note  Patient Details  Name: Adam Bass MRN: [id number] Date of Birth: 11/04/1950 Referring Provider:     CARDIAC REHAB PHASE II ORIENTATION from 01/05/2018 in [place]  Referring Provider  [name]      Encounter Date: 03/02/2018  Check In: Session Check In - 03/02/18 0930      Check-In   Supervising physician immediately available to respond to emergencies  See telemetry face sheet for immediately available ER MD    Location  [facility]    Staff Present  Russella Dar, MS, EP, [facility], Exercise Physiologist;Mackie Goon Wynetta Emery, RN, Cory Munch, Exercise Physiologist    Medication changes reported      No    Fall or balance concerns reported     No    Tobacco Cessation  No Change    Warm-up and Cool-down  Performed as group-led instruction    Resistance Training Performed  Yes    VAD Patient?  No    PAD/SET Patient?  No      Pain Assessment   Currently in Pain?  No/denies    Pain Score  0-No pain    Multiple Pain Sites  No       Capillary Blood Glucose: No results found for this or any previous visit (from the past 24 hour(s)).    Social History   Tobacco Use  Smoking Status Current Every Day Smoker  . Packs/day: 0.75  . Years: 50.00  . Pack years: 37.50  . Types: Cigars  Smokeless Tobacco Never Used  Tobacco Comment   he is wearing a patch. He is smoking about 5-10 cigarettes daily.     Goals Met:  Independence with exercise equipment Exercise tolerated well No report of cardiac concerns or symptoms Strength training completed today  Goals Unmet:  N/A   Comments: Pt able to follow exercise prescription today without complaint.  Will continue to monitor for progression. Check out 1030.   Dr. Kate Sable is Medical Director for [facility] Cardiac and Pulmonary Rehab.

## 2018-03-03 NOTE — Progress Notes (Signed)
Ms. 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 02/17/18 MRI showed liver lesions. He is here for the results of a CT Chest completed 03/05/18. He denies pain. He reports some shortness of breath since beginning Downs. He does report improvement in shortness since completing radiation therapy. He has a productive cough of white sputum.  He has had a decreased appetite since his MI and also trying to improve his diet.   BP 126/78 (BP Location: Right Arm, Patient Position: Sitting)   Pulse 74   Temp 98.2 F (36.8 C) (Oral)   Resp 20   Ht 5\' 9"  (1.753 m)   Wt 144 lb (65.3 kg)   SpO2 95%   BMI 21.27 kg/m    Wt Readings from Last 3 Encounters:  03/06/18 144 lb (65.3 kg)  03/04/18 142 lb (64.4 kg)  02/19/18 142 lb 11.2 oz (64.7 kg)

## 2018-03-04 ENCOUNTER — Encounter (HOSPITAL_COMMUNITY): Payer: BLUE CROSS/BLUE SHIELD

## 2018-03-04 ENCOUNTER — Other Ambulatory Visit: Payer: Self-pay

## 2018-03-04 ENCOUNTER — Ambulatory Visit
Admission: RE | Admit: 2018-03-04 | Discharge: 2018-03-04 | Disposition: A | Discharge status: Home / Self Care | Payer: BLUE CROSS/BLUE SHIELD | Source: Ambulatory Visit | Attending: Internal Medicine | Admitting: Internal Medicine

## 2018-03-04 ENCOUNTER — Telehealth: Payer: Self-pay | Admitting: *Deleted

## 2018-03-04 DIAGNOSIS — B1710 Acute hepatitis C without hepatic coma: Secondary | ICD-10-CM | POA: Diagnosis not present

## 2018-03-04 DIAGNOSIS — Z85118 Personal history of other malignant neoplasm of bronchus and lung: Secondary | ICD-10-CM | POA: Diagnosis not present

## 2018-03-04 DIAGNOSIS — C220 Liver cell carcinoma: Secondary | ICD-10-CM

## 2018-03-04 DIAGNOSIS — I219 Acute myocardial infarction, unspecified: Secondary | ICD-10-CM | POA: Diagnosis not present

## 2018-03-04 HISTORY — PX: IR RADIOLOGIST EVAL & MGMT: IMG5224

## 2018-03-04 NOTE — Telephone Encounter (Signed)
CALLED PATIENT TO INFORM OF CT FOR 03-05-18- ARRIVAL TIME- 3:45 PM @ [facility], NO RESTRICTIONS TO TEST,. PT. TO GET RESULTS FROM DR. SQUIRE ON 03-06-18, SPOKE WITH PATIENT AND HE IS AWARE OF THESE APPTS.

## 2018-03-05 ENCOUNTER — Ambulatory Visit (HOSPITAL_COMMUNITY)
Admission: RE | Admit: 2018-03-05 | Discharge: 2018-03-05 | Disposition: A | Discharge status: Home / Self Care | Payer: BLUE CROSS/BLUE SHIELD | Source: Ambulatory Visit | Attending: Radiation Oncology | Admitting: Radiation Oncology

## 2018-03-05 DIAGNOSIS — J432 Centrilobular emphysema: Secondary | ICD-10-CM | POA: Insufficient documentation

## 2018-03-05 DIAGNOSIS — J438 Other emphysema: Secondary | ICD-10-CM | POA: Insufficient documentation

## 2018-03-05 DIAGNOSIS — I700 Atherosclerosis of aorta: Secondary | ICD-10-CM | POA: Diagnosis not present

## 2018-03-05 DIAGNOSIS — C3432 Malignant neoplasm of lower lobe, left bronchus or lung: Secondary | ICD-10-CM

## 2018-03-05 DIAGNOSIS — R911 Solitary pulmonary nodule: Secondary | ICD-10-CM | POA: Insufficient documentation

## 2018-03-05 DIAGNOSIS — C3490 Malignant neoplasm of unspecified part of unspecified bronchus or lung: Secondary | ICD-10-CM | POA: Diagnosis not present

## 2018-03-06 ENCOUNTER — Encounter (HOSPITAL_COMMUNITY)
Admission: RE | Admit: 2018-03-06 | Discharge: 2018-03-06 | Disposition: A | Discharge status: Home / Self Care | Payer: BLUE CROSS/BLUE SHIELD | Source: Ambulatory Visit | Attending: Interventional Cardiology | Admitting: Interventional Cardiology

## 2018-03-06 ENCOUNTER — Ambulatory Visit
Admission: RE | Admit: 2018-03-06 | Discharge: 2018-03-06 | Disposition: A | Discharge status: Home / Self Care | Payer: BLUE CROSS/BLUE SHIELD | Source: Ambulatory Visit | Attending: Radiation Oncology | Admitting: Radiation Oncology

## 2018-03-06 ENCOUNTER — Encounter: Payer: Self-pay | Admitting: Radiation Oncology

## 2018-03-06 ENCOUNTER — Other Ambulatory Visit: Payer: Self-pay

## 2018-03-06 DIAGNOSIS — Z79899 Other long term (current) drug therapy: Secondary | ICD-10-CM | POA: Diagnosis not present

## 2018-03-06 DIAGNOSIS — C3432 Malignant neoplasm of lower lobe, left bronchus or lung: Secondary | ICD-10-CM | POA: Diagnosis not present

## 2018-03-06 DIAGNOSIS — R0602 Shortness of breath: Secondary | ICD-10-CM | POA: Diagnosis not present

## 2018-03-06 DIAGNOSIS — Z08 Encounter for follow-up examination after completed treatment for malignant neoplasm: Secondary | ICD-10-CM | POA: Diagnosis not present

## 2018-03-06 DIAGNOSIS — F17210 Nicotine dependence, cigarettes, uncomplicated: Secondary | ICD-10-CM | POA: Diagnosis not present

## 2018-03-06 DIAGNOSIS — R918 Other nonspecific abnormal finding of lung field: Secondary | ICD-10-CM | POA: Diagnosis not present

## 2018-03-06 DIAGNOSIS — Z85118 Personal history of other malignant neoplasm of bronchus and lung: Secondary | ICD-10-CM | POA: Diagnosis not present

## 2018-03-06 NOTE — Progress Notes (Addendum)
Radiation Oncology         [phone] ________________________________  Name: Adam Bass MRN: [id number]  Date: 03/06/2018  DOB: 1950/06/30  Follow-Up Visit Note  Outpatient  CC: Sinda Du, MD  Hildred Laser, MD  Diagnosis and Prior Radiotherapy:    ICD-10-CM   1. Primary cancer of left lower lobe of lung (HCC) C34.32     Primary cancer of left lower lobe of lung (Adam Bass) Staging form: Lung, AJCC 8th Edition - Clinical stage from 04/30/2017: Stage IA2 (cT1b, cN0, cM0) - Signed by Eppie Gibson, MD on 04/30/2017    06/03/17 - 06/09/17 54 Gy directed to the Left Lung delivered in 3 fractions of 18 Gy every other day.  CHIEF COMPLAINT: Here for follow-up and surveillance of left lower lung cancer  Narrative:  The patient returns today for routine follow-up since completion of his radiation therapy on 06/09/2017.  Since his last visit to the office, he underwent a CT Chest w contrast yesterday (11/21) with results showing: no change in the left lower lobe pulmonary nodule. Lesion in the posterior left upper lobe is also stable since prior study. New 3 mm anterior left upper lobe pulmonary nodule. Close attention on follow-up recommended.  The patient was also found to have elevated liver function tests which was further evaluated with an Abdominal US on 02/05/2018 showing two solid-appearing liver lesions worrisome for liver metastases. This was followed by an Abdominal MRI on 02/17/2018 showing a 2.6 cm lesion within lateral segment of left lobe of liver has enhancement characteristics. As well as three additional lesions within segment 8, segment 4B and segment three have enhancement characteristics compatible with LI-RADS 3 lesion, intermediate probability for [facility]. Morphologic features of liver compatible with cirrhosis.  This is being followed with Hildred Laser, MD.  I personally reviewed the patient's imaging prior to today's visit.  On review of systems, he denies pain.  He reports some shortness of breath since beginning [place]. He does report improvement in shortness of breath since completing radiation therapy. He has a productive cough of white sputum. 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 Pertinent positives are listed and detailed within the above HPI. He notes a decrease with smoking cigarettes.                              ALLERGIES:  is allergic to bee venom.  Meds: Current Outpatient Medications  Medication Sig Dispense Refill  . albuterol (PROAIR HFA) 108 (90 BASE) MCG/ACT inhaler Inhale 2 puffs into the lungs every 6 (six) hours as needed for wheezing.    Marland Kitchen aspirin EC 81 MG tablet Take 81 mg by mouth daily.    . bisoprolol (ZEBETA) 5 MG tablet Take 0.5 tablets (2.5 mg total) by mouth daily. 60 tablet 1  . Blood Pressure KIT 1 Units by Does not apply route daily. 1 each 0  . ezetimibe (ZETIA) 10 MG tablet Take 1 tablet (10 mg total) by mouth daily. 90 tablet 1  . fluticasone (FLONASE) 50 MCG/ACT nasal spray Place 1 spray into both nostrils daily.    Marland Kitchen LORazepam (ATIVAN) 0.5 MG tablet Take 1 tablet (0.5 mg total) by mouth every 6 (six) hours as needed for anxiety. 25 tablet 0  . Multiple Vitamins-Minerals (MULTIVITAMIN WITH MINERALS) tablet Take 1 tablet by mouth daily.    . nitroGLYCERIN (NITROSTAT) 0.4 MG SL tablet Place  1 tablet (0.4 mg total) under the tongue every 5 (five) minutes as needed. 25 tablet 3  . pantoprazole (PROTONIX) 40 MG tablet TK 1 T PO QD  5  . Potassium 99 MG TABS Take 1 tablet by mouth daily.     . tamsulosin (FLOMAX) 0.4 MG CAPS capsule Take 0.4 mg by mouth daily.    . Tetrahydrozoline HCl (VISINE OP) Place 1 drop into both eyes daily as needed (Dry Eyes).    . ticagrelor (BRILINTA) 90 MG TABS tablet Take 1 tablet (90 mg total) by mouth 2 (two) times daily. 180 tablet 2  . TRELEGY ELLIPTA 100-62.5-25 MCG/INH AEPB INHALE 1 PUFF INTO THE LUNGS EVERY DAY 60 each 11  . vitamin E  400 UNIT capsule Take 1 tablet by mouth daily.      No current facility-administered medications for this encounter.     Physical Findings: The patient is in no acute distress. Patient is alert and oriented. He is accompanied by his ex-wife today.  height is 5' 9"  (1.753 m) and weight is 144 lb (65.3 kg). His oral temperature is 98.2 F (36.8 C). His blood pressure is 126/78 and his pulse is 74. His respiration is 20 and oxygen saturation is 95%. .   Neck: No masses appreciated in the supra or subclavicular neck. Heart: Regular in rate and rhythm with no murmurs, rubs, or gallops. Chest: Decreased breath sounds bilaterally. (Patient does have a history of COPD.)  ECOG 1   Lab Findings: Lab Results  Component Value Date   WBC 11.3 (H) 11/27/2017   HGB 13.7 11/27/2017   HCT 40.1 11/27/2017   MCV 106.4 (H) 11/27/2017   PLT 193 11/27/2017    Radiographic Findings: Ct Chest Wo Contrast  Result Date: 03/06/2018 CLINICAL DATA:  Lung cancer EXAM: CT CHEST WITHOUT CONTRAST TECHNIQUE: Multidetector CT imaging of the chest was performed following the standard protocol without IV contrast. COMPARISON:  12/04/2017 FINDINGS: Cardiovascular: The heart size is normal. No substantial pericardial effusion. Coronary artery calcification is evident. Atherosclerotic calcification is noted in the wall of the thoracic aorta. Mediastinum/Nodes: No mediastinal lymphadenopathy. No evidence for gross hilar lymphadenopathy although assessment is limited by the lack of intravenous contrast on today's study. The esophagus has normal imaging features. There is no axillary lymphadenopathy. Lungs/Pleura: Biapical pleuroparenchymal scarring is associated with centrilobular and paraseptal emphysema. Irregular nodular opacity posterior left upper lobe is similar, measuring 11 mm today at the same level (24/7) as measured at 11 mm previously. The adjacent pleuroparenchymal opacity is compatible with radiation scarring. 3  mm anteromedial left upper lobe nodule (37/7) is new in the interval. 7 x 7 mm left lower lobe nodule (104/7) is unchanged. No pleural effusion. Upper Abdomen: 9 mm hyperattenuating lesion in the posterior left kidney is stable since PET-CT of 04/02/2017. Left upper pole exophytic cyst unchanged and the hemorrhagic cyst seen anteriorly from the left kidney previously has decreased in the interval. Musculoskeletal: No worrisome lytic or sclerotic osseous abnormality. IMPRESSION: 1. No change in the left lower lobe pulmonary nodule. 2. Lesion in the posterior left upper lobe is also stable since prior study. 3. New 3 mm anterior left upper lobe pulmonary nodule. Close attention on follow-up recommended. 4.  Emphysema. (ICD10-J43.9) 5.  Aortic Atherosclerois (ICD10-170.0) Electronically Signed   By: Misty Stanley M.D.   On: 03/06/2018 08:15   Mr Abdomen Wwo Contrast  Result Date: 02/17/2018 CLINICAL DATA:  Evaluate liver lesion.  Increased risk for HCC. EXAM: MRI ABDOMEN  WITHOUT AND WITH CONTRAST TECHNIQUE: Multiplanar multisequence MR imaging of the abdomen was performed both before and after the administration of intravenous contrast. CONTRAST:  7 cc of Gadavist. COMPARISON:  MRI 12/28/2014 FINDINGS: Lower chest: No acute findings. Hepatobiliary: Hypertrophy of the caudate lobe and lateral segment of left lobe of liver identified. The contour of the liver is slightly irregular. Within segment 2 of the liver there is a lesion which exhibits mild increased T1 signal precontrast with arterial phase I so enhancement. On the delayed images there is non peripheral washout and a pseudo capsule. This measures 2.6 cm, image 27/19, compatible with LI-RADS 4 (probably Garza-Salinas II). Smaller lesion within lateral segment of left lobe of liver measures 7 mm, image 39/5010. No washout or pseudo capsule appearance identified on the delayed images, LI-RADS 3 (intermediate probability for [facility]). Within segment 4B there is a 1.5 cm which  exhibits arterial phase hyperenhancement. No washout or pseudo capsule appearance of this lesion identified, LI-RADS 3. Within segment 8 there is a 1 cm arterial phase enhancing structure without washout or pseudo capsule, image 24/5010, LI-RADS 3. Sludge noted within the dependent portion of the gallbladder. No biliary ductal dilatation. Pancreas: No mass, inflammatory changes, or other parenchymal abnormality identified. Spleen:  Within normal limits in size and appearance. Adrenals/Urinary Tract:  Normal adrenal glands. No hydronephrosis identified bilaterally. The previously characterized hemorrhagic cyst arising from the anterior cortex of the left kidney has decreased in size in the interval. This currently measures 2.5 cm, image 22/18. Previously 6.1 cm. No enhancement identified within this structure additional smaller hemorrhagic cysts are noted within both kidneys. This includes a exophytic lesion arising from the posterolateral cortex of the upper pole of left kidney measuring 1.9 cm, image 18/18. Stomach/Bowel: Visualized portions within the abdomen are unremarkable. Vascular/Lymphatic: Aortic atherosclerosis. No aneurysm. Infrarenal abdominal aortic ectasia measures 2.9 cm, image 46/5010. The portal vein appears patent. No adenopathy identified. Other:  No ascites or focal fluid collections. Musculoskeletal: No suspicious bone lesions identified. IMPRESSION: 1. 2.6 cm lesion within lateral segment of left lobe of liver has enhancement characteristics (LI-RADS 4 lesion, probably HCC. 2. Three additional lesions within segment 8, segment 4B and segment three have enhancement characteristics compatible with LI-RADS 3 lesion, intermediate probability for [facility]. 3. Morphologic features of liver compatible with cirrhosis. 4. Bilateral Bosniak category 1 and 2 kidney lesions. 5.  Aortic Atherosclerosis (ICD10-I70.0). 6. Ectatic abdominal aorta at risk for aneurysm development. Recommend followup by ultrasound in  5 years. This recommendation follows ACR consensus guidelines: White Paper of the ACR Incidental Findings Committee II on Vascular Findings. J Am Coll Radiol 2013; 10:789-794. Electronically Signed   By: Kerby Moors M.D.   On: 02/17/2018 12:02   US Abdomen Complete  Result Date: 02/05/2018 CLINICAL DATA:  Elevated liver function tests, history of lung carcinoma diagnosed in February of 2019, ETOH abuse, smoking history EXAM: ABDOMEN ULTRASOUND COMPLETE COMPARISON:  PET-CT of 10/02/2016 FINDINGS: Gallbladder: The gallbladder is visualized and no gallstones are noted. There is no pain over the gallbladder with compression. Common bile duct: Diameter: The common bile duct is normal measuring 2.4 mm in diameter. Liver: The echogenicity of the liver parenchyma is within normal limits. However, there are 2 rounded echogenic solid lesions which appear to be within the left lobe. The larger is more centrally positioned measures 1.9 x 1.8 x 2.6 cm. The second which is more caudal in position MR anteriorly positioned measures 1.4 x 0.9 cm. These are worrisome for metastasis to  the liver. No other hepatic focal lesion is seen. Portal vein is patent on color Doppler imaging with normal direction of blood flow towards the liver. IVC: No abnormality visualized. Pancreas: The pancreas is visualized in the region of the head and body with only the distal tail obscured by bowel gas. The pancreatic duct is not dilated. Spleen: The spleen measures 5.7 cm. Right Kidney: Length: 10.7 cm. No hydronephrosis is seen. A small cyst is present in the upper pole medially of 1.6 cm. Left Kidney: Length: 11.3 cm. No hydronephrosis is noted. A cyst is present laterally near the upper pole of 2.4 cm. Abdominal aorta: The abdominal aorta appears ectatic but no focal aneurysmal dilatation is seen. Other findings: None. IMPRESSION: 1. Two solid-appearing liver lesions worrisome for liver metastases. Recommend CT or MRI of the abdomen to  evaluate further. 2. No gallstones. 3. Portions of the tail of the pancreas are obscured by bowel gas. Electronically Signed   By: Ivar Drape M.D.   On: 02/05/2018 16:59   Ir Radiologist Eval & Mgmt  Result Date: 03/04/2018 Please refer to notes tab for details about interventional procedure. (Op Note)   Impression/Plan: This is a very nice gentleman with a history of left lung cancer. Lower lobe; responded well to SBRT. Scan appears stable no evidence of progression or recurrence on recent CT chest. No clinical signs of infection per history or physical exam.   Unfortunately, recent abdominal MRI (02/17/2018) did show liver lesions which are suspicious for malignancy. Patient has not undergone liver biopsy. Patient has spoken with GI specialist, Dr. Laural Golden of [facility]. Will continue to follow with him.  In terms of the patient's lung cancer, he is in remission but of course needs to proceed with continued surveillance through chest CT's and will need the presumed liver cancer treated by his sub-specialists. I will fax my note from today to Dr. Laural Golden.  I will see him in 3 months with a CT chest prior to the visit. Patient informed to follow up sooner if needed.     In a face to face visit lasting over 15 minutes, greater than 50% of the time was spent discussing logistics of treatment, and coordinating the patient's care.  ___________________________________   Eppie Gibson, MD   This document serves as a record of services personally performed by Eppie Gibson, MD. It was created on her behalf by Arlyce Harman, a trained medical scribe. The creation of this record is based on the scribe's personal observations and the provider's statements to them. This document has been checked and approved by the attending provider.

## 2018-03-08 ENCOUNTER — Other Ambulatory Visit: Payer: Self-pay | Admitting: Radiation Oncology

## 2018-03-08 ENCOUNTER — Encounter: Payer: Self-pay | Admitting: Radiation Oncology

## 2018-03-08 DIAGNOSIS — C3432 Malignant neoplasm of lower lobe, left bronchus or lung: Secondary | ICD-10-CM

## 2018-03-09 ENCOUNTER — Encounter (HOSPITAL_COMMUNITY)
Admission: RE | Admit: 2018-03-09 | Discharge: 2018-03-09 | Disposition: A | Discharge status: Home / Self Care | Payer: BLUE CROSS/BLUE SHIELD | Source: Ambulatory Visit | Attending: Interventional Cardiology | Admitting: Interventional Cardiology

## 2018-03-09 DIAGNOSIS — I2119 ST elevation (STEMI) myocardial infarction involving other coronary artery of inferior wall: Secondary | ICD-10-CM | POA: Diagnosis not present

## 2018-03-09 DIAGNOSIS — Z955 Presence of coronary angioplasty implant and graft: Secondary | ICD-10-CM

## 2018-03-09 NOTE — Progress Notes (Signed)
Daily Session Note  Patient Details  Name: Adam Bass MRN: [id number] Date of Birth: 13-Nov-1950 Referring Provider:     CARDIAC REHAB PHASE II ORIENTATION from 01/05/2018 in [place]  Referring Provider  [name]      Encounter Date: 03/09/2018  Check In: Session Check In - 03/09/18 0930      Check-In   Supervising physician immediately available to respond to emergencies  See telemetry face sheet for immediately available ER MD    Location  [facility]    Staff Present  Aundra Dubin, RN, Cory Munch, Exercise Physiologist    Medication changes reported      No    Fall or balance concerns reported     No    Tobacco Cessation  No Change    Warm-up and Cool-down  Performed as group-led instruction    Resistance Training Performed  Yes    VAD Patient?  No    PAD/SET Patient?  No      Pain Assessment   Currently in Pain?  No/denies    Pain Score  0-No pain    Multiple Pain Sites  No       Capillary Blood Glucose: No results found for this or any previous visit (from the past 24 hour(s)).    Social History   Tobacco Use  Smoking Status Current Every Day Smoker  . Packs/day: 0.75  . Years: 50.00  . Pack years: 37.50  . Types: Cigars  Smokeless Tobacco Never Used  Tobacco Comment   he is wearing a patch. He is smoking about 5-10 cigarettes daily.     Goals Met:  Independence with exercise equipment Exercise tolerated well No report of cardiac concerns or symptoms Strength training completed today  Goals Unmet:  Not Applicable  Comments: Pt able to follow exercise prescription today without complaint.  Will continue to monitor for progression. Check out 1030.   Dr. Kate Sable is Medical Director for [facility] Cardiac and Pulmonary Rehab.

## 2018-03-11 ENCOUNTER — Encounter (HOSPITAL_COMMUNITY)
Admission: RE | Admit: 2018-03-11 | Discharge: 2018-03-11 | Disposition: A | Discharge status: Home / Self Care | Payer: BLUE CROSS/BLUE SHIELD | Source: Ambulatory Visit | Attending: Interventional Cardiology | Admitting: Interventional Cardiology

## 2018-03-11 DIAGNOSIS — Z955 Presence of coronary angioplasty implant and graft: Secondary | ICD-10-CM

## 2018-03-11 DIAGNOSIS — I2119 ST elevation (STEMI) myocardial infarction involving other coronary artery of inferior wall: Secondary | ICD-10-CM

## 2018-03-11 NOTE — Progress Notes (Signed)
Daily Session Note  Patient Details  Name: Adam Bass MRN: [id number] Date of Birth: 03/07/1951 Referring Provider:     CARDIAC REHAB PHASE II ORIENTATION from 01/05/2018 in [place]  Referring Provider  [name]      Encounter Date: 03/11/2018  Check In: Session Check In - 03/11/18 0930      Check-In   Supervising physician immediately available to respond to emergencies  See telemetry face sheet for immediately available ER MD    Location  [facility]    Staff Present  Russella Dar, MS, EP, [facility], Exercise Physiologist;Debra Wynetta Emery, RN, Cory Munch, Exercise Physiologist    Medication changes reported      No    Fall or balance concerns reported     No    Tobacco Cessation  No Change    Warm-up and Cool-down  Performed as group-led instruction    Resistance Training Performed  Yes    VAD Patient?  No    PAD/SET Patient?  No      Pain Assessment   Currently in Pain?  No/denies    Pain Score  0-No pain    Multiple Pain Sites  No       Capillary Blood Glucose: No results found for this or any previous visit (from the past 24 hour(s)).    Social History   Tobacco Use  Smoking Status Current Every Day Smoker  . Packs/day: 0.75  . Years: 50.00  . Pack years: 37.50  . Types: Cigars  Smokeless Tobacco Never Used  Tobacco Comment   he is wearing a patch. He is smoking about 5-10 cigarettes daily.     Goals Met:  Independence with exercise equipment Exercise tolerated well No report of cardiac concerns or symptoms  Goals Unmet:  Not Applicable  Comments: Pt able to follow exercise prescription today without complaint.  Will continue to monitor for progression. Check out 1030.   Dr. Kate Sable is Medical Director for [facility] Cardiac and Pulmonary Rehab.

## 2018-03-13 ENCOUNTER — Encounter (HOSPITAL_COMMUNITY): Payer: BLUE CROSS/BLUE SHIELD

## 2018-03-13 NOTE — Consult Note (Signed)
Chief Complaint: Patient was seen in consultation today for treatment of hepatocellular carcinoma at the request of Rehman,Adam Bass  Referring Physician(s): Rehman,Adam Bass  History of Present Illness: Adam Bass is a 67 y.o. male with a recent diagnosis of hepatocellular carcinoma here to discuss treatment options.  Recent medical history includes a history of left lower lobe squamous lung carcinoma, Stage IA2, treated by SBRT by Dr. Isidore Moos with good results and no evidence of recurrence on follow-up.  He has an additional left upper lobe lung nodule that is being followed with previous biopsy of that nodule not revealing evidence of malignancy.  [name] sustained an ST elevation myocardial infarction in August, 2019 requiring emergent PCI on 11/23/2017 by Dr.Varanasi with percutaneous thrombectomy and placement of a drug-eluting stent in the RCA for treatment of RCA occlusion.  He is currently in cardiac rehab and denies any angina.  He states that he is making progress with exercise tolerance with cardiac rehab. He is being followed by Dr. Carlyle Dolly.  Note was made of elevated liver function tests during hospitalization at the time of his myocardial infarction.  Outpatient work-up by Dr. Laural Golden revealed Hepatitis C infection with quantitative RNA of 2.3 million and Genotype 1a.  Also noted was elevation of serum AFP to 164.  Ultrasound demonstrated liver lesions.  MRI of the abdomen on 02/17/2018 demonstrated cirrhosis and a 2.6 cm mass within the lateral segment (segment II) of the left lobe consistent with a LI-RADS 4 lesion (probable HCC).  Three additional lesions were consistent with LI-RADS 3 lesions: 7 mm in the left lobe (segment III), 1.5 cm within the left lobe (segment IVB) and 1.1 cm within the right lobe (segment VIII).  Adam Bass has no abdominal pain.  He denies any history of GI bleed or melena.  He does not know how he may have contracted hepatitis C.  He  continues to smoke and drinks alcohol.  Past Medical History:  Diagnosis Date  . COPD (chronic obstructive pulmonary disease) ([place])    for 10 years or more   . History of radiation therapy 06/03/17- 06/09/17   Left Lung treated to 54 Gy with 3 fx of 18 Gy. SBRT  . Lung cancer ([place])   . Myocardial infarction ([place])   . Skin cancer of face 07/2015   skin cancer removed from face by Dr. Tarri Alonzo Loving in [place]    Past Surgical History:  Procedure Laterality Date  . COLONOSCOPY  2004   Dr. Truett Perna  . COLONOSCOPY N/A 08/27/2012   Procedure: COLONOSCOPY;  Surgeon: Rogene Houston, MD;  Location: [facility];  Service: Endoscopy;  Laterality: N/A;  1200  . CORONARY STENT INTERVENTION N/A 11/23/2017   Procedure: CORONARY STENT INTERVENTION;  Surgeon: Jettie Booze, MD;  Location: [facility];  Service: Cardiovascular;  Laterality: N/A;  . IR RADIOLOGIST EVAL & MGMT  03/04/2018  . LEFT HEART CATH AND CORONARY ANGIOGRAPHY N/A 11/23/2017   Procedure: LEFT HEART CATH AND CORONARY ANGIOGRAPHY;  Surgeon: Jettie Booze, MD;  Location: [facility];  Service: Cardiovascular;  Laterality: N/A;    Allergies: Bee venom  Medications: Prior to Admission medications   Medication Sig Start Date End Date Taking? Authorizing Provider  albuterol (PROAIR HFA) 108 (90 BASE) MCG/ACT inhaler Inhale 2 puffs into the lungs every 6 (six) hours as needed for wheezing.   Yes [provider]  aspirin EC 81 MG tablet Take 81 mg by mouth daily.  Yes [provider]  bisoprolol (ZEBETA) 5 MG tablet Take 0.5 tablets (2.5 mg total) by mouth daily. 11/28/17  Yes [name] B, NP  Blood Pressure KIT 1 Units by Does not apply route daily. 11/27/17  Yes Cheryln Manly, NP  ezetimibe (ZETIA) 10 MG tablet Take 1 tablet (10 mg total) by mouth daily. 11/27/17 05/26/18 Yes Cheryln Manly, NP  fluticasone (FLONASE) 50 MCG/ACT nasal spray Place 1 spray into both nostrils daily.    Yes [provider]  LORazepam (ATIVAN) 0.5 MG tablet Take 1 tablet (0.5 mg total) by mouth every 6 (six) hours as needed for anxiety. 04/21/17  Yes Ardath Sax, MD  Multiple Vitamins-Minerals (MULTIVITAMIN WITH MINERALS) tablet Take 1 tablet by mouth daily.   Yes [provider]  nitroGLYCERIN (NITROSTAT) 0.4 MG SL tablet Place 1 tablet (0.4 mg total) under the tongue every 5 (five) minutes as needed. 12/11/17  Yes [name], NP  Potassium 99 MG TABS Take 1 tablet by mouth daily.    Yes [provider]  tamsulosin (FLOMAX) 0.4 MG CAPS capsule Take 0.4 mg by mouth daily.   Yes [provider]  Tetrahydrozoline HCl (VISINE OP) Place 1 drop into both eyes daily as needed ([place] Eyes).   Yes [provider]  ticagrelor (BRILINTA) 90 MG TABS tablet Take 1 tablet (90 mg total) by mouth 2 (two) times daily. 11/27/17  Yes Cheryln Manly, NP  TRELEGY ELLIPTA 100-62.5-25 MCG/INH AEPB INHALE 1 PUFF INTO THE LUNGS EVERY DAY 12/24/17  Yes Tanda Rockers, MD  vitamin E 400 UNIT capsule Take 1 tablet by mouth daily.    Yes [provider]  pantoprazole (PROTONIX) 40 MG tablet TK 1 T PO QD 02/02/18   [provider]     Family History  Problem Relation Age of Onset  . Breast cancer Sister   . Colon cancer Neg Hx     Social History   Socioeconomic History  . Marital status: Divorced    Spouse name: Not on file  . Number of children: Not on file  . Years of education: Not on file  . Highest education level: Not on file  Occupational History  . Not on file  Social Needs  . Financial resource strain: Not on file  . Food insecurity:    Worry: Not on file    Inability: Not on file  . Transportation needs:    Medical: No    Non-medical: No  Tobacco Use  . Smoking status: Current Every Day Smoker    Packs/day: 0.75    Years: 50.00    Pack years: 37.50    Types: Cigars  . Smokeless tobacco: Never Used  . Tobacco comment: he  is wearing a patch. He is smoking about 5-10 cigarettes daily.   Substance and Sexual Activity  . Alcohol use: Yes    Alcohol/week: 8.0 standard drinks    Types: 8 Cans of beer per week    Comment: 8 beers weekly.   . Drug use: No  . Sexual activity: Not on file  Lifestyle  . Physical activity:    Days per week: Not on file    Minutes per session: Not on file  . Stress: Not on file  Relationships  . Social connections:    Talks on phone: Not on file    Gets together: Not on file    Attends religious service: Not on file    Active member of  club or organization: Not on file    Attends meetings of clubs or organizations: Not on file    Relationship status: Not on file  Other Topics Concern  . Not on file  Social History Narrative  . Not on file    ECOG Status: 0 - Asymptomatic  Review of Systems: A 12 point ROS discussed and pertinent positives are indicated in the HPI above.  All other systems are negative.  Review of Systems  Constitutional: Positive for fatigue.  HENT: Negative.   Respiratory: Negative.   Cardiovascular: Negative.   Gastrointestinal: Negative.   Genitourinary: Negative.   Musculoskeletal: Negative.   Neurological: Negative.   Hematological: Negative.     Vital Signs: BP 111/66 (BP Location: Left Arm, Patient Position: Sitting, Cuff Size: Normal)   Pulse 69   Temp 97.9 F (36.6 C)   Resp 20   Ht 5' 10" (1.778 m)   Wt 64.4 kg   SpO2 96%   BMI 20.37 kg/m    Physical Exam  Constitutional: He is oriented to person, place, and time. He appears well-developed and well-nourished. No distress.  HENT:  Head: Normocephalic and atraumatic.  Neck: Neck supple. No JVD present. No tracheal deviation present. No thyromegaly present.  Cardiovascular: Normal rate, regular rhythm and normal heart sounds. Exam reveals no gallop and no friction rub.  No murmur heard. Pulmonary/Chest: Effort normal and breath sounds normal. No stridor. No respiratory  distress. He has no wheezes. He has no rales.  Abdominal: Soft. Bowel sounds are normal. He exhibits no distension and no mass. There is no tenderness. There is no rebound and no guarding.  Musculoskeletal: He exhibits no edema.  Lymphadenopathy:    He has no cervical adenopathy.  Neurological: He is alert and oriented to person, place, and time.  Skin: Skin is warm and dry. He is not diaphoretic.  Vitals reviewed.    Imaging: Ct Chest Wo Contrast  Result Date: 03/06/2018 CLINICAL DATA:  Lung cancer EXAM: CT CHEST WITHOUT CONTRAST TECHNIQUE: Multidetector CT imaging of the chest was performed following the standard protocol without IV contrast. COMPARISON:  12/04/2017 FINDINGS: Cardiovascular: The heart size is normal. No substantial pericardial effusion. Coronary artery calcification is evident. Atherosclerotic calcification is noted in the wall of the thoracic aorta. Mediastinum/Nodes: No mediastinal lymphadenopathy. No evidence for gross hilar lymphadenopathy although assessment is limited by the lack of intravenous contrast on today's study. The esophagus has normal imaging features. There is no axillary lymphadenopathy. Lungs/Pleura: Biapical pleuroparenchymal scarring is associated with centrilobular and paraseptal emphysema. Irregular nodular opacity posterior left upper lobe is similar, measuring 11 mm today at the same level (24/7) as measured at 11 mm previously. The adjacent pleuroparenchymal opacity is compatible with radiation scarring. 3 mm anteromedial left upper lobe nodule (37/7) is new in the interval. 7 x 7 mm left lower lobe nodule (104/7) is unchanged. No pleural effusion. Upper Abdomen: 9 mm hyperattenuating lesion in the posterior left kidney is stable since PET-CT of 04/02/2017. Left upper pole exophytic cyst unchanged and the hemorrhagic cyst seen anteriorly from the left kidney previously has decreased in the interval. Musculoskeletal: No worrisome lytic or sclerotic osseous  abnormality. IMPRESSION: 1. No change in the left lower lobe pulmonary nodule. 2. Lesion in the posterior left upper lobe is also stable since prior study. 3. New 3 mm anterior left upper lobe pulmonary nodule. Close attention on follow-up recommended. 4.  Emphysema. (ICD10-J43.9) 5.  Aortic Atherosclerois (ICD10-170.0) Electronically Signed   By: Randall Hiss  Tery Sanfilippo M.D.   On: 03/06/2018 08:15   Mr Abdomen Wwo Contrast  Result Date: 02/17/2018 CLINICAL DATA:  Evaluate liver lesion.  Increased risk for HCC. EXAM: MRI ABDOMEN WITHOUT AND WITH CONTRAST TECHNIQUE: Multiplanar multisequence MR imaging of the abdomen was performed both before and after the administration of intravenous contrast. CONTRAST:  7 cc of Gadavist. COMPARISON:  MRI 12/28/2014 FINDINGS: Lower chest: No acute findings. Hepatobiliary: Hypertrophy of the caudate lobe and lateral segment of left lobe of liver identified. The contour of the liver is slightly irregular. Within segment 2 of the liver there is a lesion which exhibits mild increased T1 signal precontrast with arterial phase I so enhancement. On the delayed images there is non peripheral washout and a pseudo capsule. This measures 2.6 cm, image 27/19, compatible with LI-RADS 4 (probably [facility]). Smaller lesion within lateral segment of left lobe of liver measures 7 mm, image 39/5010. No washout or pseudo capsule appearance identified on the delayed images, LI-RADS 3 (intermediate probability for [facility]). Within segment 4B there is a 1.5 cm which exhibits arterial phase hyperenhancement. No washout or pseudo capsule appearance of this lesion identified, LI-RADS 3. Within segment 8 there is a 1 cm arterial phase enhancing structure without washout or pseudo capsule, image 24/5010, LI-RADS 3. Sludge noted within the dependent portion of the gallbladder. No biliary ductal dilatation. Pancreas: No mass, inflammatory changes, or other parenchymal abnormality identified. Spleen:  Within normal limits in  size and appearance. Adrenals/Urinary Tract:  Normal adrenal glands. No hydronephrosis identified bilaterally. The previously characterized hemorrhagic cyst arising from the anterior cortex of the left kidney has decreased in size in the interval. This currently measures 2.5 cm, image 22/18. Previously 6.1 cm. No enhancement identified within this structure additional smaller hemorrhagic cysts are noted within both kidneys. This includes a exophytic lesion arising from the posterolateral cortex of the upper pole of left kidney measuring 1.9 cm, image 18/18. Stomach/Bowel: Visualized portions within the abdomen are unremarkable. Vascular/Lymphatic: Aortic atherosclerosis. No aneurysm. Infrarenal abdominal aortic ectasia measures 2.9 cm, image 46/5010. The portal vein appears patent. No adenopathy identified. Other:  No ascites or focal fluid collections. Musculoskeletal: No suspicious bone lesions identified. IMPRESSION: 1. 2.6 cm lesion within lateral segment of left lobe of liver has enhancement characteristics (LI-RADS 4 lesion, probably HCC. 2. Three additional lesions within segment 8, segment 4B and segment three have enhancement characteristics compatible with LI-RADS 3 lesion, intermediate probability for [facility]. 3. Morphologic features of liver compatible with cirrhosis. 4. Bilateral Bosniak category 1 and 2 kidney lesions. 5.  Aortic Atherosclerosis (ICD10-I70.0). 6. Ectatic abdominal aorta at risk for aneurysm development. Recommend followup by ultrasound in 5 years. This recommendation follows ACR consensus guidelines: White Paper of the ACR Incidental Findings Committee II on Vascular Findings. J Am Coll Radiol 2013; 10:789-794. Electronically Signed   By: Kerby Moors M.D.   On: 02/17/2018 12:02   Ir Radiologist Eval & Mgmt  Result Date: 03/04/2018 Please refer to notes tab for details about interventional procedure. (Op Note)   Labs:  CBC: Recent Labs    11/23/17 1802 11/24/17 0609  11/25/17 0233 11/27/17 0406  WBC 11.1* 11.2* 10.8* 11.3*  HGB 17.4* 14.7 14.3 13.7  HCT 50.1 43.0 42.6 40.1  PLT 230 208 205 193    COAGS: Recent Labs    04/10/17 0610 11/23/17 1532 02/20/18 1051  INR 1.00 1.17 1.1  APTT 31 140*  --     BMP: Recent Labs    11/25/17 0233 11/26/17  0315 11/27/17 0406 12/09/17 1257 02/17/18 1036  NA 138 136 138 139  --   K 3.9 4.2 4.0 4.7  --   CL 105 102 103 101  --   CO2 _0 --   GLUCOSE 100* 99 103* 96  --   BUN _1 --   CALCIUM 8.3* 8.6* 8.5* 9.0  --   CREATININE 0.71 0.72 0.68 0.91 0.80  GFRNONAA >60 >60 >60 87  --   GFRAA >60 >60 >60 100  --     LIVER FUNCTION TESTS: Recent Labs    11/26/17 0338 11/27/17 0406 12/09/17 1257 01/01/18 0731 02/02/18 1426  BILITOT 1.4* 1.5* 0.7 0.4 0.7  AST 114* 93* 92* 68* 57*  ALT 98* 96* 121* 91* 76*  ALKPHOS 89 92 102 112  --   PROT 5.8* 6.0* 7.2 6.9 6.9  ALBUMIN 2.9* 2.9* 4.0 3.9  --     TUMOR MARKERS: Recent Labs    02/02/18 1426  AFPTM 163.9*    Assessment and Plan:  I reviewed MRI findings with Mr. Collington.  The most suspicious 2.6 cm left lobe hepatic mass meets criteria for hepatocellular carcinoma.  Other lesions are not definitively malignant but will need to be followed carefully given cirrhosis and active hepatitis C infection.  The 2.6 cm mass is in a location and of size that would be amenable to percutaneous thermal ablation.  I reviewed details of ablation with Mr. Winterton including risks and expected outcomes.  Currently, he does not appear to have any active lung cancer but does have known COPD/emphysema and continues to smoke cigars.  He is followed by Dr. Melvyn Novas and in planning possible future ablation under general anesthesia, I would like to have Dr. Melvyn Novas evaluate Mr. Cahn for anesthesia risk.  I told Mr. Gilkey that currently the main limiting factor that may delay thermal ablation of the hepatocellular carcinoma is the recent myocardial  infarction and right coronary stent placement requiring 2 drug antiplatelet therapy with aspirin and Brilinta.  He will have to have cardiac clearance for general anesthesia and we will have to consult with Dr. Harl Bowie regarding when he might be able to come off Brilinta for 4-5 days prior to ablation.  After an uncomplicated procedure, he would be able to restart Brilinta the day after ablation.  He can remain on aspirin before and following ablation.  If we would have to delay ablation for another 2 to 3 months, I reassured Mr. Shirk that the mass would likely not grow significantly enough that we would not be able to treated with thermal ablation.  Transcatheter treatment options remain such as Y-90 and TACE.  However, thermal ablation would be more ideal for the single most suspicious mass currently.  Thank you for this interesting consult.  I greatly enjoyed meeting AURELIANO OSHIELDS and look forward to participating in their care.  A copy of this report was sent to the requesting provider on this date.  Electronically SignedAletta Edouard T 03/13/2018, 9:32 AM     I spent a total of 40 Minutes  in face to face in clinical consultation, greater than 50% of which was counseling/coordinating care for treatment of hepatocellular carcinoma.

## 2018-03-16 ENCOUNTER — Encounter (HOSPITAL_COMMUNITY)
Admission: RE | Admit: 2018-03-16 | Discharge: 2018-03-16 | Disposition: A | Discharge status: Home / Self Care | Payer: BLUE CROSS/BLUE SHIELD | Source: Ambulatory Visit | Attending: Interventional Cardiology | Admitting: Interventional Cardiology

## 2018-03-16 DIAGNOSIS — Z955 Presence of coronary angioplasty implant and graft: Secondary | ICD-10-CM

## 2018-03-16 DIAGNOSIS — I2119 ST elevation (STEMI) myocardial infarction involving other coronary artery of inferior wall: Secondary | ICD-10-CM | POA: Diagnosis not present

## 2018-03-16 NOTE — Progress Notes (Signed)
Cardiac Individual Treatment Plan  Patient Details  Name: Adam Bass MRN: [id number] Date of Birth: 01/07/1951 Referring Provider:     CARDIAC REHAB PHASE II ORIENTATION from 01/05/2018 in [facility]  Referring Provider  [name]      Initial Encounter Date:    CARDIAC REHAB PHASE II ORIENTATION from 01/05/2018 in [facility]  Date  01/05/18      Visit Diagnosis: ST elevation myocardial infarction (STEMI) involving other coronary artery of inferior wall (HCC)  Status post coronary artery stent placement  Patient's Home Medications on Admission:  Current Outpatient Medications:  .  albuterol (PROAIR HFA) 108 (90 BASE) MCG/ACT inhaler, Inhale 2 puffs into the lungs every 6 (six) hours as needed for wheezing., Disp: , Rfl:  .  aspirin EC 81 MG tablet, Take 81 mg by mouth daily., Disp: , Rfl:  .  bisoprolol (ZEBETA) 5 MG tablet, Take 0.5 tablets (2.5 mg total) by mouth daily., Disp: 60 tablet, Rfl: 1 .  Blood Pressure KIT, 1 Units by Does not apply route daily., Disp: 1 each, Rfl: 0 .  ezetimibe (ZETIA) 10 MG tablet, Take 1 tablet (10 mg total) by mouth daily., Disp: 90 tablet, Rfl: 1 .  fluticasone (FLONASE) 50 MCG/ACT nasal spray, Place 1 spray into both nostrils daily., Disp: , Rfl:  .  LORazepam (ATIVAN) 0.5 MG tablet, Take 1 tablet (0.5 mg total) by mouth every 6 (six) hours as needed for anxiety., Disp: 25 tablet, Rfl: 0 .  Multiple Vitamins-Minerals (MULTIVITAMIN WITH MINERALS) tablet, Take 1 tablet by mouth daily., Disp: , Rfl:  .  nitroGLYCERIN (NITROSTAT) 0.4 MG SL tablet, Place 1 tablet (0.4 mg total) under the tongue every 5 (five) minutes as needed., Disp: 25 tablet, Rfl: 3 .  pantoprazole (PROTONIX) 40 MG tablet, TK 1 T PO QD, Disp: , Rfl: 5 .  Potassium 99 MG TABS, Take 1 tablet by mouth daily. , Disp: , Rfl:  .  tamsulosin (FLOMAX) 0.4 MG CAPS capsule, Take 0.4 mg by mouth daily., Disp: , Rfl:  .  Tetrahydrozoline HCl  (VISINE OP), Place 1 drop into both eyes daily as needed (Dry Eyes)., Disp: , Rfl:  .  ticagrelor (BRILINTA) 90 MG TABS tablet, Take 1 tablet (90 mg total) by mouth 2 (two) times daily., Disp: 180 tablet, Rfl: 2 .  TRELEGY ELLIPTA 100-62.5-25 MCG/INH AEPB, INHALE 1 PUFF INTO THE LUNGS EVERY DAY, Disp: 60 each, Rfl: 11 .  vitamin E 400 UNIT capsule, Take 1 tablet by mouth daily. , Disp: , Rfl:   Past Medical History: Past Medical History:  Diagnosis Date  . COPD (chronic obstructive pulmonary disease) ([place])    for 10 years or more   . History of radiation therapy 06/03/17- 06/09/17   Left Lung treated to 54 Gy with 3 fx of 18 Gy. SBRT  . Lung cancer ([place])   . Myocardial infarction ([place])   . Skin cancer of face 07/2015   skin cancer removed from face by Dr. Tarri Glenn in [place]    Tobacco Use: Social History   Tobacco Use  Smoking Status Current Every Day Smoker  . Packs/day: 0.75  . Years: 50.00  . Pack years: 37.50  . Types: Cigars  Smokeless Tobacco Never Used  Tobacco Comment   he is wearing a patch. He is smoking about 5-10 cigarettes daily.     Labs: Recent Review Flowsheet Data    Labs for ITP Cardiac and Pulmonary Rehab Latest Ref Rng &  Units 05/06/2017 11/23/2017 11/24/2017   Cholestrol 0 - 200 mg/dL - 146 140   LDLCALC 0 - 99 mg/dL - 100(H) 84   HDL >40 mg/dL - 35(L) 33(L)   Trlycerides <150 mg/dL - 53 116   Hemoglobin A1c 4.8 - 5.6 % - - 5.0   PHART 7.350 - 7.450 7.444 - -   PCO2ART 32.0 - 48.0 mmHg 38.9 - -   HCO3 20.0 - 28.0 mmol/L 26.7 - -   TCO2 22 - 32 mmol/L - 23 -   O2SAT % 95.9 - -      Capillary Blood Glucose: Lab Results  Component Value Date   GLUCAP 104 (H) 04/02/2017   GLUCAP 112 (H) 10/02/2016     Exercise Target Goals: Exercise Program Goal: Individual exercise prescription set using results from initial 6 min walk test and THRR while considering  patient's activity barriers and safety.   Exercise Prescription Goal: Starting with aerobic  activity 30 plus minutes a day, 3 days per week for initial exercise prescription. Provide home exercise prescription and guidelines that participant acknowledges understanding prior to discharge.  Activity Barriers & Risk Stratification: Activity Barriers & Cardiac Risk Stratification - 01/05/18 1029      Activity Barriers & Cardiac Risk Stratification   Activity Barriers  Joint Problems;Deconditioning;Muscular Weakness;Shortness of Breath    Cardiac Risk Stratification  High       6 Minute Walk: 6 Minute Walk    Row Name 01/05/18 1028         6 Minute Walk   Phase  Initial     Distance  1000 feet     Walk Time  6 minutes     # of Rest Breaks  0     MPH  1.89     METS  2.45     RPE  13     Perceived Dyspnea   13     VO2 Peak  10.25     Symptoms  Yes (comment)     Comments  4/10 bilateral hip pain      Resting HR  64 bpm     Resting BP  110/60     Resting Oxygen Saturation   95 %     Exercise Oxygen Saturation  during 6 min walk  92 %     Max Ex. HR  91 bpm     Max Ex. BP  138/68     2 Minute Post BP  114/66        Oxygen Initial Assessment:   Oxygen Re-Evaluation:   Oxygen Discharge (Final Oxygen Re-Evaluation):   Initial Exercise Prescription: Initial Exercise Prescription - 01/05/18 1000      Date of Initial Exercise RX and Referring Provider   Date  01/05/18    Referring Provider  [name]    Expected Discharge Date  04/06/18      NuStep   Level  1    SPM  55    Minutes  17    METs  1.9      Arm Ergometer   Level  1.5    Watts  11    RPM  41    Minutes  17    METs  1.7      Prescription Details   Frequency (times per week)  3    Duration  Progress to 30 minutes of continuous aerobic without signs/symptoms of physical distress      Intensity   THRR 40-80% of Max  Heartrate  [phone]    Ratings of Perceived Exertion  11-13    Perceived Dyspnea  0-4      Progression   Progression  Continue progressive overload as per policy without  signs/symptoms or physical distress.      Resistance Training   Training Prescription  Yes    Weight  1    Reps  10-15       Perform Capillary Blood Glucose checks as needed.  Exercise Prescription Changes:  Exercise Prescription Changes    Row Name 01/05/18 1000 02/03/18 1100 03/05/18 1200         Response to Exercise   Blood Pressure (Admit)  -  112/58  110/60     Blood Pressure (Exercise)  -  132/60  138/70     Blood Pressure (Exit)  -  116/60  118/60     Heart Rate (Admit)  -  59 bpm  64 bpm     Heart Rate (Exercise)  -  72 bpm  105 bpm     Heart Rate (Exit)  -  67 bpm  70 bpm     Rating of Perceived Exertion (Exercise)  -  11  11     Duration  -  Progress to 30 minutes of  aerobic without signs/symptoms of physical distress  Progress to 30 minutes of  aerobic without signs/symptoms of physical distress     Intensity  -  THRR New 99-117-135  THRR unchanged       Progression   Progression  -  Continue to progress workloads to maintain intensity without signs/symptoms of physical distress.  Continue to progress workloads to maintain intensity without signs/symptoms of physical distress.     Average METs  -  1.9  2.15       Resistance Training   Training Prescription  -  Yes  Yes     Weight  -  2  3     Reps  -  10-15  10-15       NuStep   Level  -  2  2     SPM  -  66  76     Minutes  -  22  22     METs  -  1.7  1.8       Arm Ergometer   Level  -  2  2.5     Watts  -  11  16     RPM  -  44  50     Minutes  -  17  17     METs  -  2.1  2.5       Home Exercise Plan   Plans to continue exercise at  Home (comment) walking   Home (comment)  Home (comment)     Frequency  Add 2 additional days to program exercise sessions.  Add 2 additional days to program exercise sessions.  Add 2 additional days to program exercise sessions.     Initial Home Exercises Provided  01/05/18  01/05/18  01/05/18        Exercise Comments:  Exercise Comments    Row Name 01/19/18 1518  02/18/18 1430 03/09/18 1527       Exercise Comments  Patient has done well in the program so far. He wants to get strong enough to return to work.   Patient feels he has gotten stronger. He is still hoping this helps him make it back to work.  Patient feels stronger but has been out some due to illness. He is having a lot of trouble breathing. We will monitor progress.         Exercise Goals and Review:  Exercise Goals    Row Name 01/05/18 1041             Exercise Goals   Increase Physical Activity  Yes       Intervention  Provide advice, education, support and counseling about physical activity/exercise needs.;Develop an individualized exercise prescription for aerobic and resistive training based on initial evaluation findings, risk stratification, comorbidities and participant's personal goals.       Expected Outcomes  Short Term: Attend rehab on a regular basis to increase amount of physical activity.       Increase Strength and Stamina  Yes       Intervention  Provide advice, education, support and counseling about physical activity/exercise needs.;Develop an individualized exercise prescription for aerobic and resistive training based on initial evaluation findings, risk stratification, comorbidities and participant's personal goals.       Expected Outcomes  Short Term: Increase workloads from initial exercise prescription for resistance, speed, and METs.;Short Term: Perform resistance training exercises routinely during rehab and add in resistance training at home;Long Term: Improve cardiorespiratory fitness, muscular endurance and strength as measured by increased METs and functional capacity (6MWT)       Able to understand and use rate of perceived exertion (RPE) scale  Yes       Intervention  Provide education and explanation on how to use RPE scale       Expected Outcomes  Short Term: Able to use RPE daily in rehab to express subjective intensity level;Long Term:  Able to use  RPE to guide intensity level when exercising independently       Able to understand and use Dyspnea scale  Yes       Intervention  Provide education and explanation on how to use Dyspnea scale       Expected Outcomes  Short Term: Able to use Dyspnea scale daily in rehab to express subjective sense of shortness of breath during exertion;Long Term: Able to use Dyspnea scale to guide intensity level when exercising independently       Knowledge and understanding of Target Heart Rate Range (THRR)  Yes       Intervention  Provide education and explanation of THRR including how the numbers were predicted and where they are located for reference       Expected Outcomes  Short Term: Able to state/look up THRR;Long Term: Able to use THRR to govern intensity when exercising independently;Short Term: Able to use daily as guideline for intensity in rehab       Able to check pulse independently  Yes       Intervention  Provide education and demonstration on how to check pulse in carotid and radial arteries.;Review the importance of being able to check your own pulse for safety during independent exercise       Expected Outcomes  Short Term: Able to explain why pulse checking is important during independent exercise;Long Term: Able to check pulse independently and accurately       Understanding of Exercise Prescription  Yes       Intervention  Provide education, explanation, and written materials on patient's individual exercise prescription       Expected Outcomes  Short Term: Able to explain program exercise prescription;Long Term: Able to explain home exercise prescription to  exercise independently          Exercise Goals Re-Evaluation : Exercise Goals Re-Evaluation    Row Name 01/19/18 1516 02/18/18 1428 03/09/18 1525         Exercise Goal Re-Evaluation   Exercise Goals Review  Increase Physical Activity;Increase Strength and Stamina;Able to understand and use rate of perceived exertion (RPE)  scale;Knowledge and understanding of Target Heart Rate Range (THRR);Understanding of Exercise Prescription;Able to understand and use Dyspnea scale;Able to check pulse independently  Increase Physical Activity;Increase Strength and Stamina;Able to understand and use rate of perceived exertion (RPE) scale;Knowledge and understanding of Target Heart Rate Range (THRR);Understanding of Exercise Prescription;Able to understand and use Dyspnea scale;Able to check pulse independently  Increase Physical Activity;Increase Strength and Stamina;Able to understand and use rate of perceived exertion (RPE) scale;Knowledge and understanding of Target Heart Rate Range (THRR);Understanding of Exercise Prescription;Able to understand and use Dyspnea scale;Able to check pulse independently     Comments  Patient is new to the program. He has completed 6 sessions. He has tolerated exercise well. We will continue to monitor him and progress as tolerated.   Patient has attended 15 sessions now. He reports feeling stronger and feels this is helping him to be able to do more around the house. He is progressing right on track.   Patient does well in the program when he is feeling well. He has been under the weather the last week or so. He tolerates exercise well when he is able to come.      Expected Outcomes  Increase strength, learn to eat healthier.   Increase strength, continue to make healthier eating choices.   Increase strength, continue to make healthier eating choices and decerase smoking.          Discharge Exercise Prescription (Final Exercise Prescription Changes): Exercise Prescription Changes - 03/05/18 1200      Response to Exercise   Blood Pressure (Admit)  110/60    Blood Pressure (Exercise)  138/70    Blood Pressure (Exit)  118/60    Heart Rate (Admit)  64 bpm    Heart Rate (Exercise)  105 bpm    Heart Rate (Exit)  70 bpm    Rating of Perceived Exertion (Exercise)  11    Duration  Progress to 30 minutes of   aerobic without signs/symptoms of physical distress    Intensity  THRR unchanged      Progression   Progression  Continue to progress workloads to maintain intensity without signs/symptoms of physical distress.    Average METs  2.15      Resistance Training   Training Prescription  Yes    Weight  3    Reps  10-15      NuStep   Level  2    SPM  76    Minutes  22    METs  1.8      Arm Ergometer   Level  2.5    Watts  16    RPM  50    Minutes  17    METs  2.5      Home Exercise Plan   Plans to continue exercise at  Home (comment)    Frequency  Add 2 additional days to program exercise sessions.    Initial Home Exercises Provided  01/05/18       Nutrition:  Target Goals: Understanding of nutrition guidelines, daily intake of sodium <1535m, cholesterol <2059m calories 30% from fat and 7% or less from  saturated fats, daily to have 5 or more servings of fruits and vegetables.  Biometrics: Pre Biometrics - 01/05/18 1041      Pre Biometrics   Height  5' 9"  (1.753 m)    Weight  64.9 kg    Waist Circumference  30.25 inches    Hip Circumference  32.5 inches    Waist to Hip Ratio  0.93 %    BMI (Calculated)  21.12    Triceps Skinfold  3 mm    % Body Fat  14.2 %    Grip Strength  19.4 kg    Flexibility  16.3 in    Single Leg Stand  5 seconds        Nutrition Therapy Plan and Nutrition Goals: Nutrition Therapy & Goals - 01/15/18 1553      Personal Nutrition Goals   Nutrition Goal  For heart healthy choices add >50% of whole grains, make half their plate fruits and vegetables. Discuss the difference between starchy vegetables and leafy greens, and how leafy vegetables provide fiber, helps maintain healthy weight, helps control blood glucose, and lowers cholesterol.  Discuss purchasing fresh or frozen vegetable to reduce sodium and not to add grease, fat or sugar. Consume <18oz of red meat per week. Consume lean cuts of meats and very little of meats high in sodium and  nitrates such as pork and lunch meats. Discussed portion control for all food groups.      Comments  Patient met with RD 01/15/18.      Intervention Plan   Intervention  Nutrition handout(s) given to patient.    Expected Outcomes  Short Term Goal: Understand basic principles of dietary content, such as calories, fat, sodium, cholesterol and nutrients.       Nutrition Assessments: Nutrition Assessments - 01/05/18 0942      MEDFICTS Scores   Pre Score  50       Nutrition Goals Re-Evaluation: Nutrition Goals Re-Evaluation    [place] Name 01/21/18 1510 02/19/18 0734 03/16/18 0746         Goals   Current Weight  142 lb (64.4 kg)  139 lb 14.4 oz (63.5 kg)  141 lb (64 kg)     Nutrition Goal  For heart healthy choices add >50% of whole grains, make half their plate fruits and vegetables. Discuss the difference between starchy vegetables and leafy greens, and how leafy vegetables provide fiber, helps maintain healthy weight, helps control blood glucose, and lowers cholesterol.  Discuss purchasing fresh or frozen vegetable to reduce sodium and not to add grease, fat or sugar. Consume <18oz of red meat per week. Consume lean cuts of meats and very little of meats high in sodium and nitrates such as pork and lunch meats. Discussed portion control for all food groups.    For heart healthy choices add >50% of whole grains, make half their plate fruits and vegetables. Discuss the difference between starchy vegetables and leafy greens, and how leafy vegetables provide fiber, helps maintain healthy weight, helps control blood glucose, and lowers cholesterol.  Discuss purchasing fresh or frozen vegetable to reduce sodium and not to add grease, fat or sugar. Consume <18oz of red meat per week. Consume lean cuts of meats and very little of meats high in sodium and nitrates such as pork and lunch meats. Discussed portion control for all food groups.    For heart healthy choices add >50% of whole grains, make half  their plate fruits and vegetables. Discuss the difference between starchy  vegetables and leafy greens, and how leafy vegetables provide fiber, helps maintain healthy weight, helps control blood glucose, and lowers cholesterol.  Discuss purchasing fresh or frozen vegetable to reduce sodium and not to add grease, fat or sugar. Consume <18oz of red meat per week. Consume lean cuts of meats and very little of meats high in sodium and nitrates such as pork and lunch meats. Discussed portion control for all food groups.       Comment  Patient has lost 1 lb since his orientation visit. He says he is trying to eat heart healthy. Will continue to monitor for progress.   Patient has lost 2 lbs since last 30 day review. He continues to say he is trying to eat heart healthy.   Patient has gained 2 lbs since last 30 day review. He continues to say he is trying to eat heart healthy.      Expected Outcome  Patient will continue to work toward meeting his nutritional goals.   Patient will continue to work toward meeting his nutritional goals.   Patient will continue to work toward meeting his nutritional goals.         Nutrition Goals Discharge (Final Nutrition Goals Re-Evaluation): Nutrition Goals Re-Evaluation - 03/16/18 0746      Goals   Current Weight  141 lb (64 kg)    Nutrition Goal  For heart healthy choices add >50% of whole grains, make half their plate fruits and vegetables. Discuss the difference between starchy vegetables and leafy greens, and how leafy vegetables provide fiber, helps maintain healthy weight, helps control blood glucose, and lowers cholesterol.  Discuss purchasing fresh or frozen vegetable to reduce sodium and not to add grease, fat or sugar. Consume <18oz of red meat per week. Consume lean cuts of meats and very little of meats high in sodium and nitrates such as pork and lunch meats. Discussed portion control for all food groups.      Comment  Patient has gained 2 lbs since last 30 day  review. He continues to say he is trying to eat heart healthy.     Expected Outcome  Patient will continue to work toward meeting his nutritional goals.        Psychosocial: Target Goals: Acknowledge presence or absence of significant depression and/or stress, maximize coping skills, provide positive support system. Participant is able to verbalize types and ability to use techniques and skills needed for reducing stress and depression.  Initial Review & Psychosocial Screening: Initial Psych Review & Screening - 01/05/18 1032      Initial Review   Current issues with  None Identified      Family Dynamics   Good Support System?  Yes    Comments  Patient has good family support from his daughters and his ex-wife and from friends.       Barriers   Psychosocial barriers to participate in program  There are no identifiable barriers or psychosocial needs.      Screening Interventions   Interventions  Encouraged to exercise;Provide feedback about the scores to participant    Expected Outcomes  Long Term goal: The participant improves quality of Life and PHQ9 Scores as seen by post scores and/or verbalization of changes       Quality of Life Scores: Quality of Life - 01/05/18 1043      Quality of Life   Select  Quality of Life      Quality of Life Scores   Health/Function Pre  15.53 %    Socioeconomic Pre  24 %    Psych/Spiritual Pre  24.86 %    Family Pre  27 %    GLOBAL Pre  20.7 %      Scores of 19 and below usually indicate a poorer quality of life in these areas.  A difference of  2-3 points is a clinically meaningful difference.  A difference of 2-3 points in the total score of the Quality of Life Index has been associated with significant improvement in overall quality of life, self-image, physical symptoms, and general health in studies assessing change in quality of life.  PHQ-9: Recent Review Flowsheet Data    Depression screen [facility] 2/9 01/05/2018 09/12/2017 07/04/2017  04/30/2017   Decreased Interest 0 0 0 0   Down, Depressed, Hopeless 0 0 0 0   PHQ - 2 Score 0 0 0 0   Altered sleeping 1 - - -   Tired, decreased energy 1 - - -   Change in appetite 1 - - -   Feeling bad or failure about yourself  0 - - -   Trouble concentrating 0 - - -   Moving slowly or fidgety/restless 1 - - -   Suicidal thoughts 0 - - -   PHQ-9 Score 4 - - -   Difficult doing work/chores Somewhat difficult - - -     Interpretation of Total Score  Total Score Depression Severity:  1-4 = Minimal depression, 5-9 = Mild depression, 10-14 = Moderate depression, 15-19 = Moderately severe depression, 20-27 = Severe depression   Psychosocial Evaluation and Intervention: Psychosocial Evaluation - 01/05/18 1034      Psychosocial Evaluation & Interventions   Interventions  Encouraged to exercise with the program and follow exercise prescription;Stress management education;Relaxation education    Comments  Patient's initial QOL score was 20.70 and his PHQ_9 score was 4 with no psychosocial issues identified. He does have difficulty falling asleep since his procedure. His PCP prescribed Lorazepam 0.5 mg as needed for sleep.     Expected Outcomes  Patient will have no psychosocial issues identified at discharge. His QOL and PHQ-9 scores will improve at discharge.     Continue Psychosocial Services   No Follow up required       Psychosocial Re-Evaluation: Psychosocial Re-Evaluation    [place] Name 01/21/18 1516 02/19/18 0741 03/16/18 0751         Psychosocial Re-Evaluation   Current issues with  None Identified  None Identified  None Identified     Comments  Patient's initial QOL score was 20.70 and his PHQ-9 score was 4 with no issues psychosocial issues identified.  Patient's initial QOL score was 20.70 and his PHQ-9 score was 4 with no issues psychosocial issues identified.  Patient's initial QOL score was 20.70 and his PHQ-9 score was 4 with no issues psychosocial issues identified.      Expected Outcomes  Patient will have no psychosocial issues identified at discharge.   Patient will have no psychosocial issues identified at discharge.   Patient will have no psychosocial issues identified at discharge.      Interventions  Relaxation education;Stress management education;Encouraged to attend Cardiac Rehabilitation for the exercise  Relaxation education;Stress management education;Encouraged to attend Cardiac Rehabilitation for the exercise  Relaxation education;Stress management education;Encouraged to attend Cardiac Rehabilitation for the exercise     Continue Psychosocial Services   No Follow up required  No Follow up required  No Follow up required  Psychosocial Discharge (Final Psychosocial Re-Evaluation): Psychosocial Re-Evaluation - 03/16/18 0751      Psychosocial Re-Evaluation   Current issues with  None Identified    Comments  Patient's initial QOL score was 20.70 and his PHQ-9 score was 4 with no issues psychosocial issues identified.    Expected Outcomes  Patient will have no psychosocial issues identified at discharge.     Interventions  Relaxation education;Stress management education;Encouraged to attend Cardiac Rehabilitation for the exercise    Continue Psychosocial Services   No Follow up required       Vocational Rehabilitation: Provide vocational rehab assistance to qualifying candidates.   Vocational Rehab Evaluation & Intervention: Vocational Rehab - 01/05/18 0943      Initial Vocational Rehab Evaluation & Intervention   Assessment shows need for Vocational Rehabilitation  No       Education: Education Goals: Education classes will be provided on a weekly basis, covering required topics. Participant will state understanding/return demonstration of topics presented.  Learning Barriers/Preferences: Learning Barriers/Preferences - 01/05/18 0944      Learning Barriers/Preferences   Learning Barriers  None    Learning Preferences  Written  Material;Verbal Instruction;Skilled Demonstration       Education Topics: Hypertension, Hypertension Reduction -Define heart disease and high blood pressure. Discus how high blood pressure affects the body and ways to reduce high blood pressure.   CARDIAC REHAB PHASE II EXERCISE from 03/11/2018 in [place]  Date  02/18/18  Educator  D. Coad  Instruction Review Code  2- Demonstrated Understanding      Exercise and Your Heart -Discuss why it is important to exercise, the FITT principles of exercise, normal and abnormal responses to exercise, and how to exercise safely.   CARDIAC REHAB PHASE II EXERCISE from 03/11/2018 in [place]  Date  02/25/18  Educator  Coad  Instruction Review Code  2- Demonstrated Understanding      Angina -Discuss definition of angina, causes of angina, treatment of angina, and how to decrease risk of having angina.   Cardiac Medications -Review what the following cardiac medications are used for, how they affect the body, and side effects that may occur when taking the medications.  Medications include Aspirin, Beta blockers, calcium channel blockers, ACE Inhibitors, angiotensin receptor blockers, diuretics, digoxin, and antihyperlipidemics.   CARDIAC REHAB PHASE II EXERCISE from 03/11/2018 in [place]  Date  03/11/18  Educator  Etheleen Mayhew  Instruction Review Code  2- Demonstrated Understanding      Congestive Heart Failure -Discuss the definition of CHF, how to live with CHF, the signs and symptoms of CHF, and how keep track of weight and sodium intake.   Heart Disease and Intimacy -Discus the effect sexual activity has on the heart, how changes occur during intimacy as we age, and safety during sexual activity.   Smoking Cessation / COPD -Discuss different methods to quit smoking, the health benefits of quitting smoking, and the definition of COPD.   Nutrition I:  Fats -Discuss the types of cholesterol, what cholesterol does to the heart, and how cholesterol levels can be controlled.   CARDIAC REHAB PHASE II EXERCISE from 03/11/2018 in [place]  Date  01/07/18  Educator  D. Coad  Instruction Review Code  2- Demonstrated Understanding      Nutrition II: Labels -Discuss the different components of food labels and how to read food label   [place] from 03/11/2018 in [place]  CARDIAC REHABILITATION  Date  01/14/18  Educator  Etheleen Mayhew  Instruction Review Code  2- Demonstrated Understanding      Heart Parts/Heart Disease and PAD -Discuss the anatomy of the heart, the pathway of blood circulation through the heart, and these are affected by heart disease.   CARDIAC REHAB PHASE II EXERCISE from 03/11/2018 in [place]  Date  01/21/18  Educator  Etheleen Mayhew  Instruction Review Code  2- Demonstrated Understanding      Stress I: Signs and Symptoms -Discuss the causes of stress, how stress may lead to anxiety and depression, and ways to limit stress.   CARDIAC REHAB PHASE II EXERCISE from 03/11/2018 in [place]  Date  01/28/18  Educator  D. Coad  Instruction Review Code  2- Demonstrated Understanding      Stress II: Relaxation -Discuss different types of relaxation techniques to limit stress.   CARDIAC REHAB PHASE II EXERCISE from 03/11/2018 in [place]  Date  02/04/18  Educator  D. Coad  Instruction Review Code  2- Demonstrated Understanding      Warning Signs of Stroke / TIA -Discuss definition of a stroke, what the signs and symptoms are of a stroke, and how to identify when someone is having stroke.   Knowledge Questionnaire Score: Knowledge Questionnaire Score - 01/05/18 0942      Knowledge Questionnaire Score   Pre Score  25/28       Core Components/Risk Factors/Patient Goals at Admission: Personal  Goals and Risk Factors at Admission - 01/05/18 0944      Core Components/Risk Factors/Patient Goals on Admission    Weight Management  Weight Maintenance    Tobacco Cessation  Yes    Intervention  Assist the participant in steps to quit. Provide individualized education and counseling about committing to Tobacco Cessation, relapse prevention, and pharmacological support that can be provided by physician.;Advice worker, assist with locating and accessing local/national Quit Smoking programs, and support quit date choice.    Expected Outcomes  Short Term: Will demonstrate readiness to quit, by selecting a quit date.    Personal Goal Other  Yes    Personal Goal  Learn more about heart heatlhy eating; increase strength; get strong enough to get back to work. Quit smoking or reduce his smoking.     Intervention  Patient will attend CR 3 days/week and supplement with exercise 2 days/week at home by walking.     Expected Outcomes  Patient will meet his personal goals.        Core Components/Risk Factors/Patient Goals Review:  Goals and Risk Factor Review    Row Name 01/21/18 1511 02/19/18 0734 03/16/18 0746         Core Components/Risk Factors/Patient Goals Review   Personal Goals Review  Tobacco Cessation;Weight Management/Obesity Learn how to eat healthier; increase strength; quit smoking or decrease smoking. Get back to work.   Tobacco Cessation;Weight Management/Obesity Learn how to eat healthier; increase strength; quit smoking or reduce smoking; get back to wrok.   Tobacco Cessation;Weight Management/Obesity Learn how to eat healthier; increase strength; quit smoking ro decrease usuage; get back to work.      Review  Patient has completed 7 sessions losing one lb since his orientation visit. He is doing well in the program with some progression. He did meet with RD and said he learned some tips about eating healthy. He continues to say he is not ready to quit smoking yet and says  he is not interested in a smoking cessation class. Will continue to encourage patient to quit smoking.   Patient has completed 15 sessions losing 2 lbs since last 30 day review. He continues to do well in the program with progression. He says he is learning how to eat healthier. He does feel stronger but has not returned back to work. He has not quit smoking and continues to say he is not interested in a smoking cessation class. He does not cough as much during exercise as when he started the program. Will continue to monitor for progress.   Patient has completed 21 sessions gaining 2 lbs since last 30 day review. He continues to do well in the program with progression. His smoking usage has not changed. He says he is not interested in any smoking cessation classes at this time. Information was provided on smoking cessation. He says he does feel his strength is increasing. He has not returned to work yet but does feel he is strong enough. Will continue to monitor.      Expected Outcomes  Patient will continue to attend sessions and complete the program meeting his personal goals.   Patient will continue to attend sessions and complete the program meeting his personal goals.   Patient will continue to attend sessions and complete the program meeting his personal goals.         Core Components/Risk Factors/Patient Goals at Discharge (Final Review):  Goals and Risk Factor Review - 03/16/18 0746      Core Components/Risk Factors/Patient Goals Review   Personal Goals Review  Tobacco Cessation;Weight Management/Obesity   Learn how to eat healthier; increase strength; quit smoking ro decrease usuage; get back to work.    Review  Patient has completed 21 sessions gaining 2 lbs since last 30 day review. He continues to do well in the program with progression. His smoking usage has not changed. He says he is not interested in any smoking cessation classes at this time. Information was provided on smoking cessation.  He says he does feel his strength is increasing. He has not returned to work yet but does feel he is strong enough. Will continue to monitor.     Expected Outcomes  Patient will continue to attend sessions and complete the program meeting his personal goals.        ITP Comments: ITP Comments    Row Name 01/05/18 0927           ITP Comments  Patient is a 67 yr old male referred by Dr. 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 He is currenlty smoking 3/4 ppd. He is interested iin quiting but not actively trying to quit. He has no barriers to CR. He has COPD treated with inhalers.           Comments: ITP REVIEW Patient doing well in the program. Will continue to monitor for progress.

## 2018-03-16 NOTE — Progress Notes (Signed)
Daily Session Note  Patient Details  Name: Adam Bass MRN: [id number] Date of Birth: 1950/08/24 Referring Provider:     CARDIAC REHAB PHASE II ORIENTATION from 01/05/2018 in [place]  Referring Provider  [name]      Encounter Date: 03/16/2018  Check In: Session Check In - 03/16/18 0930      Check-In   Supervising physician immediately available to respond to emergencies  See telemetry face sheet for immediately available ER MD    Location  [facility]    Staff Present  Russella Dar, MS, EP, [facility], Exercise Physiologist;Tanish Prien Wynetta Emery, RN, [facility], Exercise Physiologist    Medication changes reported      No    Fall or balance concerns reported     No    Warm-up and Cool-down  Performed as group-led instruction    Resistance Training Performed  Yes    VAD Patient?  No    PAD/SET Patient?  No      Pain Assessment   Currently in Pain?  No/denies    Pain Score  0-No pain    Multiple Pain Sites  No       Capillary Blood Glucose: No results found for this or any previous visit (from the past 24 hour(s)).    Social History   Tobacco Use  Smoking Status Current Every Day Smoker  . Packs/day: 0.75  . Years: 50.00  . Pack years: 37.50  . Types: Cigars  Smokeless Tobacco Never Used  Tobacco Comment   he is wearing a patch. He is smoking about 5-10 cigarettes daily.     Goals Met:  Independence with exercise equipment Exercise tolerated well No report of cardiac concerns or symptoms Strength training completed today  Goals Unmet:  Not Applicable  Comments: Pt able to follow exercise prescription today without complaint.  Will continue to monitor for progression. Check out 1030.   Dr. Kate Sable is Medical Director for [facility] Cardiac and Pulmonary Rehab.

## 2018-03-18 ENCOUNTER — Encounter (HOSPITAL_COMMUNITY)
Admission: RE | Admit: 2018-03-18 | Discharge: 2018-03-18 | Disposition: A | Discharge status: Home / Self Care | Payer: BLUE CROSS/BLUE SHIELD | Source: Ambulatory Visit | Attending: Interventional Cardiology | Admitting: Interventional Cardiology

## 2018-03-18 DIAGNOSIS — Z955 Presence of coronary angioplasty implant and graft: Secondary | ICD-10-CM

## 2018-03-18 DIAGNOSIS — I2119 ST elevation (STEMI) myocardial infarction involving other coronary artery of inferior wall: Secondary | ICD-10-CM | POA: Diagnosis not present

## 2018-03-18 NOTE — Progress Notes (Signed)
Daily Session Note  Patient Details  Name: Adam Bass MRN: [id number] Date of Birth: December 30, 1950 Referring Provider:     CARDIAC REHAB PHASE II ORIENTATION from 01/05/2018 in [place]  Referring Provider  [name]      Encounter Date: 03/18/2018  Check In: Session Check In - 03/18/18 0930      Check-In   Supervising physician immediately available to respond to emergencies  See telemetry face sheet for immediately available ER MD    Location  [facility]    Staff Present  Aundra Dubin, RN, [facility] Munch, Exercise Physiologist;Diane Coad, MS, EP, [facility], Exercise Physiologist    Medication changes reported      No    Fall or balance concerns reported     No    Tobacco Cessation  No Change    Warm-up and Cool-down  Performed as group-led instruction    Resistance Training Performed  Yes    VAD Patient?  No    PAD/SET Patient?  No      Pain Assessment   Currently in Pain?  No/denies    Pain Score  0-No pain    Multiple Pain Sites  No       Capillary Blood Glucose: No results found for this or any previous visit (from the past 24 hour(s)).    Social History   Tobacco Use  Smoking Status Current Every Day Smoker  . Packs/day: 0.75  . Years: 50.00  . Pack years: 37.50  . Types: Cigars  Smokeless Tobacco Never Used  Tobacco Comment   he is wearing a patch. He is smoking about 5-10 cigarettes daily.     Goals Met:  Independence with exercise equipment Exercise tolerated well No report of cardiac concerns or symptoms Strength training completed today  Goals Unmet:  Not Applicable  Comments: Pt able to follow exercise prescription today without complaint.  Will continue to monitor for progression. Check out 1030.   Dr. Kate Sable is Medical Director for [facility] Cardiac and Pulmonary Rehab.

## 2018-03-20 ENCOUNTER — Encounter (HOSPITAL_COMMUNITY)
Admission: RE | Admit: 2018-03-20 | Discharge: 2018-03-20 | Disposition: A | Discharge status: Home / Self Care | Payer: BLUE CROSS/BLUE SHIELD | Source: Ambulatory Visit | Attending: Interventional Cardiology | Admitting: Interventional Cardiology

## 2018-03-20 DIAGNOSIS — I2119 ST elevation (STEMI) myocardial infarction involving other coronary artery of inferior wall: Secondary | ICD-10-CM | POA: Diagnosis not present

## 2018-03-20 DIAGNOSIS — Z955 Presence of coronary angioplasty implant and graft: Secondary | ICD-10-CM | POA: Diagnosis not present

## 2018-03-20 NOTE — Progress Notes (Signed)
Daily Session Note  Patient Details  Name: Adam Bass MRN: [id number] Date of Birth: [birth year] Referring Provider:     CARDIAC REHAB PHASE II ORIENTATION from 01/05/2018 in [place]  Referring Provider  [name]      Encounter Date: 03/20/2018  Check In: Session Check In - 03/20/18 0930      Check-In   Supervising physician immediately available to respond to emergencies  See telemetry face sheet for immediately available ER MD    Location  [facility]    Staff Present  Aundra Dubin, RN, Cory Munch, Exercise Physiologist    Medication changes reported      No    Fall or balance concerns reported     No    Tobacco Cessation  No Change    Warm-up and Cool-down  Performed as group-led instruction    Resistance Training Performed  Yes    VAD Patient?  No    PAD/SET Patient?  No      Pain Assessment   Currently in Pain?  No/denies    Pain Score  0-No pain    Multiple Pain Sites  No       Capillary Blood Glucose: No results found for this or any previous visit (from the past 24 hour(s)).    Social History   Tobacco Use  Smoking Status Current Every Day Smoker  . Packs/day: 0.75  . Years: 50.00  . Pack years: 37.50  . Types: Cigars  Smokeless Tobacco Never Used  Tobacco Comment   he is wearing a patch. He is smoking about 5-10 cigarettes daily.     Goals Met:  Independence with exercise equipment Exercise tolerated well No report of cardiac concerns or symptoms Strength training completed today  Goals Unmet:  Not Applicable  Comments: Pt able to follow exercise prescription today without complaint.  Will continue to monitor for progression. Check out 1030.   Dr. Kate Sable is Medical Director for [facility] Cardiac and Pulmonary Rehab.

## 2018-03-23 ENCOUNTER — Encounter (HOSPITAL_COMMUNITY)
Admission: RE | Admit: 2018-03-23 | Discharge: 2018-03-23 | Disposition: A | Discharge status: Home / Self Care | Payer: BLUE CROSS/BLUE SHIELD | Source: Ambulatory Visit | Attending: Interventional Cardiology | Admitting: Interventional Cardiology

## 2018-03-23 DIAGNOSIS — I2119 ST elevation (STEMI) myocardial infarction involving other coronary artery of inferior wall: Secondary | ICD-10-CM

## 2018-03-23 DIAGNOSIS — Z955 Presence of coronary angioplasty implant and graft: Secondary | ICD-10-CM

## 2018-03-23 NOTE — Progress Notes (Signed)
Daily Session Note  Patient Details  Name: Adam Bass MRN: [id number] Date of Birth: 1950-05-10 Referring Provider:     CARDIAC REHAB PHASE II ORIENTATION from 01/05/2018 in [place]  Referring Provider  [name]      Encounter Date: 03/23/2018  Check In: Session Check In - 03/23/18 0930      Check-In   Supervising physician immediately available to respond to emergencies  See telemetry face sheet for immediately available ER MD    Location  [facility]    Staff Present  Aundra Dubin, RN, Cory Munch, Exercise Physiologist    Medication changes reported      No    Fall or balance concerns reported     No    Tobacco Cessation  No Change    Warm-up and Cool-down  Performed as group-led instruction    Resistance Training Performed  Yes    VAD Patient?  No    PAD/SET Patient?  No      Pain Assessment   Currently in Pain?  No/denies    Pain Score  0-No pain    Multiple Pain Sites  No       Capillary Blood Glucose: No results found for this or any previous visit (from the past 24 hour(s)).    Social History   Tobacco Use  Smoking Status Current Every Day Smoker  . Packs/day: 0.75  . Years: 50.00  . Pack years: 37.50  . Types: Cigars  Smokeless Tobacco Never Used  Tobacco Comment   he is wearing a patch. He is smoking about 5-10 cigarettes daily.     Goals Met:  Independence with exercise equipment Exercise tolerated well No report of cardiac concerns or symptoms Strength training completed today  Goals Unmet:  Not Applicable  Comments: Pt able to follow exercise prescription today without complaint.  Will continue to monitor for progression. Check out 1030.   Dr. Kate Sable is Medical Director for [facility] Cardiac and Pulmonary Rehab.

## 2018-03-25 ENCOUNTER — Encounter (HOSPITAL_COMMUNITY)
Admission: RE | Admit: 2018-03-25 | Discharge: 2018-03-25 | Disposition: A | Discharge status: Home / Self Care | Payer: BLUE CROSS/BLUE SHIELD | Source: Ambulatory Visit | Attending: Interventional Cardiology | Admitting: Interventional Cardiology

## 2018-03-25 DIAGNOSIS — Z955 Presence of coronary angioplasty implant and graft: Secondary | ICD-10-CM | POA: Diagnosis not present

## 2018-03-25 DIAGNOSIS — I2119 ST elevation (STEMI) myocardial infarction involving other coronary artery of inferior wall: Secondary | ICD-10-CM

## 2018-03-25 NOTE — Progress Notes (Signed)
Daily Session Note  Patient Details  Name: Adam Bass MRN: [id number] Date of Birth: 06-03-1950 Referring Provider:     CARDIAC REHAB PHASE II ORIENTATION from 01/05/2018 in [place]  Referring Provider  [name]      Encounter Date: 03/25/2018  Check In: Session Check In - 03/25/18 0930      Check-In   Supervising physician immediately available to respond to emergencies  See telemetry face sheet for immediately available ER MD    Location  [facility]    Staff Present  Aundra Dubin, RN, Cory Munch, Exercise Physiologist;Other    Medication changes reported      No    Fall or balance concerns reported     No    Tobacco Cessation  No Change    Warm-up and Cool-down  Performed as group-led instruction    Resistance Training Performed  Yes    VAD Patient?  No    PAD/SET Patient?  No      Pain Assessment   Currently in Pain?  No/denies    Pain Score  0-No pain    Multiple Pain Sites  No       Capillary Blood Glucose: No results found for this or any previous visit (from the past 24 hour(s)).    Social History   Tobacco Use  Smoking Status Current Every Day Smoker  . Packs/day: 0.75  . Years: 50.00  . Pack years: 37.50  . Types: Cigars  Smokeless Tobacco Never Used  Tobacco Comment   he is wearing a patch. He is smoking about 5-10 cigarettes daily.     Goals Met:  Independence with exercise equipment Exercise tolerated well No report of cardiac concerns or symptoms Strength training completed today  Goals Unmet:  Not Applicable  Comments: Pt able to follow exercise prescription today without complaint.  Will continue to monitor for progression. Check out 1030.   Dr. Kate Sable is Medical Director for [facility] Cardiac and Pulmonary Rehab.

## 2018-03-27 ENCOUNTER — Encounter (HOSPITAL_COMMUNITY)
Admission: RE | Admit: 2018-03-27 | Discharge: 2018-03-27 | Disposition: A | Discharge status: Home / Self Care | Payer: BLUE CROSS/BLUE SHIELD | Source: Ambulatory Visit | Attending: Interventional Cardiology | Admitting: Interventional Cardiology

## 2018-03-27 DIAGNOSIS — I2119 ST elevation (STEMI) myocardial infarction involving other coronary artery of inferior wall: Secondary | ICD-10-CM | POA: Diagnosis not present

## 2018-03-27 DIAGNOSIS — Z955 Presence of coronary angioplasty implant and graft: Secondary | ICD-10-CM | POA: Diagnosis not present

## 2018-03-27 NOTE — Progress Notes (Signed)
Daily Session Note  Patient Details  Name: Adam Bass MRN: [id number] Date of Birth: [birth year] Referring Provider:     CARDIAC REHAB PHASE II ORIENTATION from 01/05/2018 in [place]  Referring Provider  [name]      Encounter Date: 03/27/2018  Check In: Session Check In - 03/27/18 0930      Check-In   Supervising physician immediately available to respond to emergencies  See telemetry face sheet for immediately available ER MD    Location  [facility]    Staff Present  Aundra Dubin, RN, Cory Munch, Exercise Physiologist    Medication changes reported      No    Fall or balance concerns reported     No    Tobacco Cessation  No Change    Warm-up and Cool-down  Performed as group-led instruction    Resistance Training Performed  Yes    VAD Patient?  No    PAD/SET Patient?  No      Pain Assessment   Currently in Pain?  No/denies    Pain Score  0-No pain    Multiple Pain Sites  No       Capillary Blood Glucose: No results found for this or any previous visit (from the past 24 hour(s)).    Social History   Tobacco Use  Smoking Status Current Every Day Smoker  . Packs/day: 0.75  . Years: 50.00  . Pack years: 37.50  . Types: Cigars  Smokeless Tobacco Never Used  Tobacco Comment   he is wearing a patch. He is smoking about 5-10 cigarettes daily.     Goals Met:  Independence with exercise equipment Exercise tolerated well No report of cardiac concerns or symptoms Strength training completed today  Goals Unmet:  Not Applicable  Comments: Pt able to follow exercise prescription today without complaint.  Will continue to monitor for progression. Check out 1030.   Dr. Kate Sable is Medical Director for [facility] Cardiac and Pulmonary Rehab.

## 2018-03-30 ENCOUNTER — Encounter (HOSPITAL_COMMUNITY)
Admission: RE | Admit: 2018-03-30 | Discharge: 2018-03-30 | Disposition: A | Discharge status: Home / Self Care | Payer: BLUE CROSS/BLUE SHIELD | Source: Ambulatory Visit | Attending: Interventional Cardiology | Admitting: Interventional Cardiology

## 2018-03-30 DIAGNOSIS — Z955 Presence of coronary angioplasty implant and graft: Secondary | ICD-10-CM | POA: Diagnosis not present

## 2018-03-30 DIAGNOSIS — I2119 ST elevation (STEMI) myocardial infarction involving other coronary artery of inferior wall: Secondary | ICD-10-CM

## 2018-03-30 NOTE — Progress Notes (Signed)
Daily Session Note  Patient Details  Name: Adam Bass MRN: [id number] Date of Birth: 1950-11-12 Referring Provider:     CARDIAC REHAB PHASE II ORIENTATION from 01/05/2018 in [place]  Referring Provider  [name]      Encounter Date: 03/30/2018  Check In: Session Check In - 03/30/18 0930      Check-In   Supervising physician immediately available to respond to emergencies  See telemetry face sheet for immediately available ER MD    Location  [facility]    Staff Present  Aundra Dubin, RN, Cory Munch, Exercise Physiologist;Diane Coad, MS, EP, [facility], Exercise Physiologist    Medication changes reported      No    Fall or balance concerns reported     No    Tobacco Cessation  No Change    Warm-up and Cool-down  Performed as group-led instruction    Resistance Training Performed  Yes    VAD Patient?  No    PAD/SET Patient?  No      Pain Assessment   Currently in Pain?  No/denies    Pain Score  0-No pain    Multiple Pain Sites  No       Capillary Blood Glucose: No results found for this or any previous visit (from the past 24 hour(s)).    Social History   Tobacco Use  Smoking Status Current Every Day Smoker  . Packs/day: 0.75  . Years: 50.00  . Pack years: 37.50  . Types: Cigars  Smokeless Tobacco Never Used  Tobacco Comment   he is wearing a patch. He is smoking about 5-10 cigarettes daily.     Goals Met:  Independence with exercise equipment Exercise tolerated well No report of cardiac concerns or symptoms Strength training completed today  Goals Unmet:  Not Applicable  Comments: Pt able to follow exercise prescription today without complaint.  Will continue to monitor for progression. Check out 1030.   Dr. Kate Sable is Medical Director for [facility] Cardiac and Pulmonary Rehab.

## 2018-04-01 ENCOUNTER — Encounter (HOSPITAL_COMMUNITY)
Admission: RE | Admit: 2018-04-01 | Discharge: 2018-04-01 | Disposition: A | Discharge status: Home / Self Care | Payer: BLUE CROSS/BLUE SHIELD | Source: Ambulatory Visit | Attending: Interventional Cardiology | Admitting: Interventional Cardiology

## 2018-04-01 DIAGNOSIS — I2119 ST elevation (STEMI) myocardial infarction involving other coronary artery of inferior wall: Secondary | ICD-10-CM | POA: Diagnosis not present

## 2018-04-01 DIAGNOSIS — Z955 Presence of coronary angioplasty implant and graft: Secondary | ICD-10-CM | POA: Diagnosis not present

## 2018-04-01 NOTE — Progress Notes (Signed)
Cardiac Individual Treatment Plan  Patient Details  Name: Adam Bass MRN: [id number] Date of Birth: [birth year] Referring Provider:     CARDIAC REHAB PHASE II ORIENTATION from 01/05/2018 in [place]  Referring Provider  [name]      Initial Encounter Date:    CARDIAC REHAB PHASE II ORIENTATION from 01/05/2018 in [place] [facility]  Date  01/05/18      Visit Diagnosis: ST elevation myocardial infarction (STEMI) involving other coronary artery of inferior wall (HCC)  Status post coronary artery stent placement  Patient's Home Medications on Admission:  Current Outpatient Medications:  .  albuterol (PROAIR HFA) 108 (90 BASE) MCG/ACT inhaler, Inhale 2 puffs into the lungs every 6 (six) hours as needed for wheezing., Disp: , Rfl:  .  aspirin EC 81 MG tablet, Take 81 mg by mouth daily., Disp: , Rfl:  .  bisoprolol (ZEBETA) 5 MG tablet, Take 0.5 tablets (2.5 mg total) by mouth daily., Disp: 60 tablet, Rfl: 1 .  Blood Pressure KIT, 1 Units by Does not apply route daily., Disp: 1 each, Rfl: 0 .  ezetimibe (ZETIA) 10 MG tablet, Take 1 tablet (10 mg total) by mouth daily., Disp: 90 tablet, Rfl: 1 .  fluticasone (FLONASE) 50 MCG/ACT nasal spray, Place 1 spray into both nostrils daily., Disp: , Rfl:  .  LORazepam (ATIVAN) 0.5 MG tablet, Take 1 tablet (0.5 mg total) by mouth every 6 (six) hours as needed for anxiety., Disp: 25 tablet, Rfl: 0 .  Multiple Vitamins-Minerals (MULTIVITAMIN WITH MINERALS) tablet, Take 1 tablet by mouth daily., Disp: , Rfl:  .  nitroGLYCERIN (NITROSTAT) 0.4 MG SL tablet, Place 1 tablet (0.4 mg total) under the tongue every 5 (five) minutes as needed., Disp: 25 tablet, Rfl: 3 .  pantoprazole (PROTONIX) 40 MG tablet, TK 1 T PO QD, Disp: , Rfl: 5 .  Potassium 99 MG TABS, Take 1 tablet by mouth daily. , Disp: , Rfl:  .  tamsulosin (FLOMAX) 0.4 MG CAPS capsule, Take 0.4 mg by mouth daily., Disp: , Rfl:  .  Tetrahydrozoline HCl  (VISINE OP), Place 1 drop into both eyes daily as needed (Dry Eyes)., Disp: , Rfl:  .  ticagrelor (BRILINTA) 90 MG TABS tablet, Take 1 tablet (90 mg total) by mouth 2 (two) times daily., Disp: 180 tablet, Rfl: 2 .  TRELEGY ELLIPTA 100-62.5-25 MCG/INH AEPB, INHALE 1 PUFF INTO THE LUNGS EVERY DAY, Disp: 60 each, Rfl: 11 .  vitamin E 400 UNIT capsule, Take 1 tablet by mouth daily. , Disp: , Rfl:   Past Medical History: Past Medical History:  Diagnosis Date  . COPD (chronic obstructive pulmonary disease) ([facility])    for 10 years or more   . History of radiation therapy 06/03/17- 06/09/17   Left Lung treated to 54 Gy with 3 fx of 18 Gy. SBRT  . Lung cancer ([place])   . Myocardial infarction ([place])   . Skin cancer of face 07/2015   skin cancer removed from face by Dr. Tarri Glenn in [place]    Tobacco Use: Social History   Tobacco Use  Smoking Status Current Every Day Smoker  . Packs/day: 0.75  . Years: 50.00  . Pack years: 37.50  . Types: Cigars  Smokeless Tobacco Never Used  Tobacco Comment   he is wearing a patch. He is smoking about 5-10 cigarettes daily.     Labs: Recent Review Flowsheet Data    Labs for ITP Cardiac and Pulmonary Rehab Latest Ref Rng &  Units 05/06/2017 11/23/2017 11/24/2017   Cholestrol 0 - 200 mg/dL - 146 140   LDLCALC 0 - 99 mg/dL - 100(H) 84   HDL >40 mg/dL - 35(L) 33(L)   Trlycerides <150 mg/dL - 53 116   Hemoglobin A1c 4.8 - 5.6 % - - 5.0   PHART 7.350 - 7.450 7.444 - -   PCO2ART 32.0 - 48.0 mmHg 38.9 - -   HCO3 20.0 - 28.0 mmol/L 26.7 - -   TCO2 22 - 32 mmol/L - 23 -   O2SAT % 95.9 - -      Capillary Blood Glucose: Lab Results  Component Value Date   GLUCAP 104 (H) 04/02/2017   GLUCAP 112 (H) 10/02/2016     Exercise Target Goals: Exercise Program Goal: Individual exercise prescription set using results from initial 6 min walk test and THRR while considering  patient's activity barriers and safety.   Exercise Prescription Goal: Starting with aerobic  activity 30 plus minutes a day, 3 days per week for initial exercise prescription. Provide home exercise prescription and guidelines that participant acknowledges understanding prior to discharge.  Activity Barriers & Risk Stratification: Activity Barriers & Cardiac Risk Stratification - 01/05/18 1029      Activity Barriers & Cardiac Risk Stratification   Activity Barriers  Joint Problems;Deconditioning;Muscular Weakness;Shortness of Breath    Cardiac Risk Stratification  High       6 Minute Walk: 6 Minute Walk    Row Name 01/05/18 1028         6 Minute Walk   Phase  Initial     Distance  1000 feet     Walk Time  6 minutes     # of Rest Breaks  0     MPH  1.89     METS  2.45     RPE  13     Perceived Dyspnea   13     VO2 Peak  10.25     Symptoms  Yes (comment)     Comments  4/10 bilateral hip pain      Resting HR  64 bpm     Resting BP  110/60     Resting Oxygen Saturation   95 %     Exercise Oxygen Saturation  during 6 min walk  92 %     Max Ex. HR  91 bpm     Max Ex. BP  138/68     2 Minute Post BP  114/66        Oxygen Initial Assessment:   Oxygen Re-Evaluation:   Oxygen Discharge (Final Oxygen Re-Evaluation):   Initial Exercise Prescription: Initial Exercise Prescription - 01/05/18 1000      Date of Initial Exercise RX and Referring Provider   Date  01/05/18    Referring Provider  [name]    Expected Discharge Date  04/06/18      NuStep   Level  1    SPM  55    Minutes  17    METs  1.9      Arm Ergometer   Level  1.5    Watts  11    RPM  41    Minutes  17    METs  1.7      Prescription Details   Frequency (times per week)  3    Duration  Progress to 30 minutes of continuous aerobic without signs/symptoms of physical distress      Intensity   THRR 40-80% of Max  Heartrate  248 [id number] 1877    Ratings of Perceived Exertion  11-13    Perceived Dyspnea  0-4      Progression   Progression  Continue progressive overload as per policy without  signs/symptoms or physical distress.      Resistance Training   Training Prescription  Yes    Weight  1    Reps  10-15       Perform Capillary Blood Glucose checks as needed.  Exercise Prescription Changes:  Exercise Prescription Changes    Row Name 01/05/18 1000 02/03/18 1100 03/05/18 1200 03/18/18 1400 03/31/18 1500     Response to Exercise   Blood Pressure (Admit)  -  112/58  110/60  118/60  110/62   Blood Pressure (Exercise)  -  132/60  138/70  150/72  140/62   Blood Pressure (Exit)  -  116/60  118/60  114/62  104/60   Heart Rate (Admit)  -  59 bpm  64 bpm  62 bpm  61 bpm   Heart Rate (Exercise)  -  72 bpm  105 bpm  70 bpm  80 bpm   Heart Rate (Exit)  -  67 bpm  70 bpm  70 bpm  69 bpm   Rating of Perceived Exertion (Exercise)  -  11  11  11  11    Comments  -  -  -  increase in overall MET level   -   Duration  -  Progress to 30 minutes of  aerobic without signs/symptoms of physical distress  Progress to 30 minutes of  aerobic without signs/symptoms of physical distress  Progress to 30 minutes of  aerobic without signs/symptoms of physical distress  Progress to 30 minutes of  aerobic without signs/symptoms of physical distress   Intensity  -  THRR New 99-117-135  THRR unchanged  THRR unchanged  THRR unchanged     Progression   Progression  -  Continue to progress workloads to maintain intensity without signs/symptoms of physical distress.  Continue to progress workloads to maintain intensity without signs/symptoms of physical distress.  Continue to progress workloads to maintain intensity without signs/symptoms of physical distress.  Continue to progress workloads to maintain intensity without signs/symptoms of physical distress.   Average METs  -  1.9  2.15  2.4  2.3     Resistance Training   Training Prescription  -  Yes  Yes  Yes  Yes   Weight  -  2  3  3  3    Reps  -  10-15  10-15  10-15  10-15     NuStep   Level  -  2  2  2  2    SPM  -  66  76  69  73   Minutes  -  22   22  22  22    METs  -  1.7  1.8  1.7  1.9     Arm Ergometer   Level  -  2  2.5  3  3    Watts  -  11  16  22  17    RPM  -  44  50  54  50   Minutes  -  17  17  17  17    METs  -  2.1  2.5  3.1  2.7     Home Exercise Plan   Plans to continue exercise at  Home (comment) walking   Home (comment)  Home (comment)  Home (comment)  Home (comment)   Frequency  Add 2 additional days to program exercise sessions.  Add 2 additional days to program exercise sessions.  Add 2 additional days to program exercise sessions.  Add 2 additional days to program exercise sessions.  Add 2 additional days to program exercise sessions.   Initial Home Exercises Provided  01/05/18  01/05/18  01/05/18  01/05/18  01/05/18      Exercise Comments:  Exercise Comments    Row Name 01/19/18 1518 02/18/18 1430 03/09/18 1527 03/31/18 1528     Exercise Comments  Patient has done well in the program so far. He wants to get strong enough to return to work.   Patient feels he has gotten stronger. He is still hoping this helps him make it back to work.   Patient feels stronger but has been out some due to illness. He is having a lot of trouble breathing. We will monitor progress.   Pt has improved his breathing the last 2 weeks. He has tolerated the exercise well so we will progress him as necessary.        Exercise Goals and Review:  Exercise Goals    Row Name 01/05/18 1041             Exercise Goals   Increase Physical Activity  Yes       Intervention  Provide advice, education, support and counseling about physical activity/exercise needs.;Develop an individualized exercise prescription for aerobic and resistive training based on initial evaluation findings, risk stratification, comorbidities and participant's personal goals.       Expected Outcomes  Short Term: Attend rehab on a regular basis to increase amount of physical activity.       Increase Strength and Stamina  Yes       Intervention  Provide advice,  education, support and counseling about physical activity/exercise needs.;Develop an individualized exercise prescription for aerobic and resistive training based on initial evaluation findings, risk stratification, comorbidities and participant's personal goals.       Expected Outcomes  Short Term: Increase workloads from initial exercise prescription for resistance, speed, and METs.;Short Term: Perform resistance training exercises routinely during rehab and add in resistance training at home;Long Term: Improve cardiorespiratory fitness, muscular endurance and strength as measured by increased METs and functional capacity (6MWT)       Able to understand and use rate of perceived exertion (RPE) scale  Yes       Intervention  Provide education and explanation on how to use RPE scale       Expected Outcomes  Short Term: Able to use RPE daily in rehab to express subjective intensity level;Long Term:  Able to use RPE to guide intensity level when exercising independently       Able to understand and use Dyspnea scale  Yes       Intervention  Provide education and explanation on how to use Dyspnea scale       Expected Outcomes  Short Term: Able to use Dyspnea scale daily in rehab to express subjective sense of shortness of breath during exertion;Long Term: Able to use Dyspnea scale to guide intensity level when exercising independently       Knowledge and understanding of Target Heart Rate Range (THRR)  Yes       Intervention  Provide education and explanation of THRR including how the numbers were predicted and where they are located for reference       Expected Outcomes  Short Term: Able to state/look  up THRR;Long Term: Able to use THRR to govern intensity when exercising independently;Short Term: Able to use daily as guideline for intensity in rehab       Able to check pulse independently  Yes       Intervention  Provide education and demonstration on how to check pulse in carotid and radial  arteries.;Review the importance of being able to check your own pulse for safety during independent exercise       Expected Outcomes  Short Term: Able to explain why pulse checking is important during independent exercise;Long Term: Able to check pulse independently and accurately       Understanding of Exercise Prescription  Yes       Intervention  Provide education, explanation, and written materials on patient's individual exercise prescription       Expected Outcomes  Short Term: Able to explain program exercise prescription;Long Term: Able to explain home exercise prescription to exercise independently          Exercise Goals Re-Evaluation : Exercise Goals Re-Evaluation    Row Name 01/19/18 1516 02/18/18 1428 03/09/18 1525 03/31/18 1527       Exercise Goal Re-Evaluation   Exercise Goals Review  Increase Physical Activity;Increase Strength and Stamina;Able to understand and use rate of perceived exertion (RPE) scale;Knowledge and understanding of Target Heart Rate Range (THRR);Understanding of Exercise Prescription;Able to understand and use Dyspnea scale;Able to check pulse independently  Increase Physical Activity;Increase Strength and Stamina;Able to understand and use rate of perceived exertion (RPE) scale;Knowledge and understanding of Target Heart Rate Range (THRR);Understanding of Exercise Prescription;Able to understand and use Dyspnea scale;Able to check pulse independently  Increase Physical Activity;Increase Strength and Stamina;Able to understand and use rate of perceived exertion (RPE) scale;Knowledge and understanding of Target Heart Rate Range (THRR);Understanding of Exercise Prescription;Able to understand and use Dyspnea scale;Able to check pulse independently  Increase Physical Activity;Able to understand and use rate of perceived exertion (RPE) scale;Increase Strength and Stamina;Knowledge and understanding of Target Heart Rate Range (THRR);Able to check pulse  independently;Understanding of Exercise Prescription    Comments  Patient is new to the program. He has completed 6 sessions. He has tolerated exercise well. We will continue to monitor him and progress as tolerated.   Patient has attended 15 sessions now. He reports feeling stronger and feels this is helping him to be able to do more around the house. He is progressing right on track.   Patient does well in the program when he is feeling well. He has been under the weather the last week or so. He tolerates exercise well when he is able to come.   Pt. has been attending rehab more regularly and feeling better. He works as hard as he can most days but is rather weak which sets him back some. He always pushes himself though to increase his distance on the machines.     Expected Outcomes  Increase strength, learn to eat healthier.   Increase strength, continue to make healthier eating choices.   Increase strength, continue to make healthier eating choices and decerase smoking.   Increase strength, continue to make healthier eating choices and decerase smoking.         Discharge Exercise Prescription (Final Exercise Prescription Changes): Exercise Prescription Changes - 03/31/18 1500      Response to Exercise   Blood Pressure (Admit)  110/62    Blood Pressure (Exercise)  140/62    Blood Pressure (Exit)  104/60    Heart Rate (Admit)  61 bpm    Heart Rate (Exercise)  80 bpm    Heart Rate (Exit)  69 bpm    Rating of Perceived Exertion (Exercise)  11    Duration  Progress to 30 minutes of  aerobic without signs/symptoms of physical distress    Intensity  THRR unchanged      Progression   Progression  Continue to progress workloads to maintain intensity without signs/symptoms of physical distress.    Average METs  2.3      Resistance Training   Training Prescription  Yes    Weight  3    Reps  10-15      NuStep   Level  2    SPM  73    Minutes  22    METs  1.9      Arm Ergometer   Level  3     Watts  17    RPM  50    Minutes  17    METs  2.7      Home Exercise Plan   Plans to continue exercise at  Home (comment)    Frequency  Add 2 additional days to program exercise sessions.    Initial Home Exercises Provided  01/05/18       Nutrition:  Target Goals: Understanding of nutrition guidelines, daily intake of sodium <1550m, cholesterol <2037m calories 30% from fat and 7% or less from saturated fats, daily to have 5 or more servings of fruits and vegetables.  Biometrics: Pre Biometrics - 01/05/18 1041      Pre Biometrics   Height  5' 9"  (1.753 m)    Weight  64.9 kg    Waist Circumference  30.25 inches    Hip Circumference  32.5 inches    Waist to Hip Ratio  0.93 %    BMI (Calculated)  21.12    Triceps Skinfold  3 mm    % Body Fat  14.2 %    Grip Strength  19.4 kg    Flexibility  16.3 in    Single Leg Stand  5 seconds        Nutrition Therapy Plan and Nutrition Goals: Nutrition Therapy & Goals - 01/15/18 1553      Personal Nutrition Goals   Nutrition Goal  For heart healthy choices add >50% of whole grains, make half their plate fruits and vegetables. Discuss the difference between starchy vegetables and leafy greens, and how leafy vegetables provide fiber, helps maintain healthy weight, helps control blood glucose, and lowers cholesterol.  Discuss purchasing fresh or frozen vegetable to reduce sodium and not to add grease, fat or sugar. Consume <18oz of red meat per week. Consume lean cuts of meats and very little of meats high in sodium and nitrates such as pork and lunch meats. Discussed portion control for all food groups.      Comments  Patient met with RD 01/15/18.      Intervention Plan   Intervention  Nutrition handout(s) given to patient.    Expected Outcomes  Short Term Goal: Understand basic principles of dietary content, such as calories, fat, sodium, cholesterol and nutrients.       Nutrition Assessments: Nutrition Assessments - 01/05/18 0942       MEDFICTS Scores   Pre Score  50       Nutrition Goals Re-Evaluation: Nutrition Goals Re-Evaluation    Row Name 01/21/18 1510 02/19/18 0734 03/16/18 0746 04/01/18 1352       Goals  Current Weight  142 lb (64.4 kg)  139 lb 14.4 oz (63.5 kg)  141 lb (64 kg)  139 lb 9.6 oz (63.3 kg)    Nutrition Goal  For heart healthy choices add >50% of whole grains, make half their plate fruits and vegetables. Discuss the difference between starchy vegetables and leafy greens, and how leafy vegetables provide fiber, helps maintain healthy weight, helps control blood glucose, and lowers cholesterol.  Discuss purchasing fresh or frozen vegetable to reduce sodium and not to add grease, fat or sugar. Consume <18oz of red meat per week. Consume lean cuts of meats and very little of meats high in sodium and nitrates such as pork and lunch meats. Discussed portion control for all food groups.    For heart healthy choices add >50% of whole grains, make half their plate fruits and vegetables. Discuss the difference between starchy vegetables and leafy greens, and how leafy vegetables provide fiber, helps maintain healthy weight, helps control blood glucose, and lowers cholesterol.  Discuss purchasing fresh or frozen vegetable to reduce sodium and not to add grease, fat or sugar. Consume <18oz of red meat per week. Consume lean cuts of meats and very little of meats high in sodium and nitrates such as pork and lunch meats. Discussed portion control for all food groups.    For heart healthy choices add >50% of whole grains, make half their plate fruits and vegetables. Discuss the difference between starchy vegetables and leafy greens, and how leafy vegetables provide fiber, helps maintain healthy weight, helps control blood glucose, and lowers cholesterol.  Discuss purchasing fresh or frozen vegetable to reduce sodium and not to add grease, fat or sugar. Consume <18oz of red meat per week. Consume lean cuts of meats and very  little of meats high in sodium and nitrates such as pork and lunch meats. Discussed portion control for all food groups.    For heart healthy choices add >50% of whole grains, make half their plate fruits and vegetables. Discuss the difference between starchy vegetables and leafy greens, and how leafy vegetables provide fiber, helps maintain healthy weight, helps control blood glucose, and lowers cholesterol.  Discuss purchasing fresh or frozen vegetable to reduce sodium and not to add grease, fat or sugar. Consume <18oz of red meat per week. Consume lean cuts of meats and very little of meats high in sodium and nitrates such as pork and lunch meats. Discussed portion control for all food groups.      Comment  Patient has lost 1 lb since his orientation visit. He says he is trying to eat heart healthy. Will continue to monitor for progress.   Patient has lost 2 lbs since last 30 day review. He continues to say he is trying to eat heart healthy.   Patient has gained 2 lbs since last 30 day review. He continues to say he is trying to eat heart healthy.   Patient has lost 2.4 lbs since last 30 day review. He says he is trying to eat healthy. Will continue to monitor for progress.     Expected Outcome  Patient will continue to work toward meeting his nutritional goals.   Patient will continue to work toward meeting his nutritional goals.   Patient will continue to work toward meeting his nutritional goals.   Patient will continue to work toward meeting his nutritional goals.        Nutrition Goals Discharge (Final Nutrition Goals Re-Evaluation): Nutrition Goals Re-Evaluation - 04/01/18 1352  Goals   Current Weight  139 lb 9.6 oz (63.3 kg)    Nutrition Goal  For heart healthy choices add >50% of whole grains, make half their plate fruits and vegetables. Discuss the difference between starchy vegetables and leafy greens, and how leafy vegetables provide fiber, helps maintain healthy weight, helps control  blood glucose, and lowers cholesterol.  Discuss purchasing fresh or frozen vegetable to reduce sodium and not to add grease, fat or sugar. Consume <18oz of red meat per week. Consume lean cuts of meats and very little of meats high in sodium and nitrates such as pork and lunch meats. Discussed portion control for all food groups.      Comment  Patient has lost 2.4 lbs since last 30 day review. He says he is trying to eat healthy. Will continue to monitor for progress.     Expected Outcome  Patient will continue to work toward meeting his nutritional goals.        Psychosocial: Target Goals: Acknowledge presence or absence of significant depression and/or stress, maximize coping skills, provide positive support system. Participant is able to verbalize types and ability to use techniques and skills needed for reducing stress and depression.  Initial Review & Psychosocial Screening: Initial Psych Review & Screening - 01/05/18 1032      Initial Review   Current issues with  None Identified      Family Dynamics   Good Support System?  Yes    Comments  Patient has good family support from his daughters and his ex-wife and from friends.       Barriers   Psychosocial barriers to participate in program  There are no identifiable barriers or psychosocial needs.      Screening Interventions   Interventions  Encouraged to exercise;Provide feedback about the scores to participant    Expected Outcomes  Long Term goal: The participant improves quality of Life and PHQ9 Scores as seen by post scores and/or verbalization of changes       Quality of Life Scores: Quality of Life - 01/05/18 1043      Quality of Life   Select  Quality of Life      Quality of Life Scores   Health/Function Pre  15.53 %    Socioeconomic Pre  24 %    Psych/Spiritual Pre  24.86 %    Family Pre  27 %    GLOBAL Pre  20.7 %      Scores of 19 and below usually indicate a poorer quality of life in these areas.  A  difference of  2-3 points is a clinically meaningful difference.  A difference of 2-3 points in the total score of the Quality of Life Index has been associated with significant improvement in overall quality of life, self-image, physical symptoms, and general health in studies assessing change in quality of life.  PHQ-9: Recent Review Flowsheet Data    Depression screen [facility] 2/9 01/05/2018 09/12/2017 07/04/2017 04/30/2017   Decreased Interest 0 0 0 0   Down, Depressed, Hopeless 0 0 0 0   PHQ - 2 Score 0 0 0 0   Altered sleeping 1 - - -   Tired, decreased energy 1 - - -   Change in appetite 1 - - -   Feeling bad or failure about yourself  0 - - -   Trouble concentrating 0 - - -   Moving slowly or fidgety/restless 1 - - -   Suicidal thoughts 0 - - -  PHQ-9 Score 4 - - -   Difficult doing work/chores Somewhat difficult - - -     Interpretation of Total Score  Total Score Depression Severity:  1-4 = Minimal depression, 5-9 = Mild depression, 10-14 = Moderate depression, 15-19 = Moderately severe depression, 20-27 = Severe depression   Psychosocial Evaluation and Intervention: Psychosocial Evaluation - 01/05/18 1034      Psychosocial Evaluation & Interventions   Interventions  Encouraged to exercise with the program and follow exercise prescription;Stress management education;Relaxation education    Comments  Patient's initial QOL score was 20.70 and his PHQ_9 score was 4 with no psychosocial issues identified. He does have difficulty falling asleep since his procedure. His PCP prescribed Lorazepam 0.5 mg as needed for sleep.     Expected Outcomes  Patient will have no psychosocial issues identified at discharge. His QOL and PHQ-9 scores will improve at discharge.     Continue Psychosocial Services   No Follow up required       Psychosocial Re-Evaluation: Psychosocial Re-Evaluation    [place] Name 01/21/18 1516 02/19/18 0741 03/16/18 0751 04/01/18 1356       Psychosocial Re-Evaluation    Current issues with  None Identified  None Identified  None Identified  None Identified    Comments  Patient's initial QOL score was 20.70 and his PHQ-9 score was 4 with no issues psychosocial issues identified.  Patient's initial QOL score was 20.70 and his PHQ-9 score was 4 with no issues psychosocial issues identified.  Patient's initial QOL score was 20.70 and his PHQ-9 score was 4 with no issues psychosocial issues identified.  Patient's initial QOL score was 20.70 and his PHQ-9 score was 4 with no issues psychosocial issues identified.    Expected Outcomes  Patient will have no psychosocial issues identified at discharge.   Patient will have no psychosocial issues identified at discharge.   Patient will have no psychosocial issues identified at discharge.   Patient will have no psychosocial issues identified at discharge.     Interventions  Relaxation education;Stress management education;Encouraged to attend Cardiac Rehabilitation for the exercise  Relaxation education;Stress management education;Encouraged to attend Cardiac Rehabilitation for the exercise  Relaxation education;Stress management education;Encouraged to attend Cardiac Rehabilitation for the exercise  Relaxation education;Stress management education;Encouraged to attend Cardiac Rehabilitation for the exercise    Continue Psychosocial Services   No Follow up required  No Follow up required  No Follow up required  No Follow up required       Psychosocial Discharge (Final Psychosocial Re-Evaluation): Psychosocial Re-Evaluation - 04/01/18 1356      Psychosocial Re-Evaluation   Current issues with  None Identified    Comments  Patient's initial QOL score was 20.70 and his PHQ-9 score was 4 with no issues psychosocial issues identified.    Expected Outcomes  Patient will have no psychosocial issues identified at discharge.     Interventions  Relaxation education;Stress management education;Encouraged to attend Cardiac Rehabilitation  for the exercise    Continue Psychosocial Services   No Follow up required       Vocational Rehabilitation: Provide vocational rehab assistance to qualifying candidates.   Vocational Rehab Evaluation & Intervention: Vocational Rehab - 01/05/18 0943      Initial Vocational Rehab Evaluation & Intervention   Assessment shows need for Vocational Rehabilitation  No       Education: Education Goals: Education classes will be provided on a weekly basis, covering required topics. Participant will state understanding/return demonstration of topics presented.  Learning Barriers/Preferences: Learning Barriers/Preferences - 01/05/18 0944      Learning Barriers/Preferences   Learning Barriers  None    Learning Preferences  Written Material;Verbal Instruction;Skilled Demonstration       Education Topics: Hypertension, Hypertension Reduction -Define heart disease and high blood pressure. Discus how high blood pressure affects the body and ways to reduce high blood pressure.   CARDIAC REHAB PHASE II EXERCISE from 04/01/2018 in [place]  Date  02/18/18  Educator  D. Coad  Instruction Review Code  2- Demonstrated Understanding      Exercise and Your Heart -Discuss why it is important to exercise, the FITT principles of exercise, normal and abnormal responses to exercise, and how to exercise safely.   CARDIAC REHAB PHASE II EXERCISE from 04/01/2018 in [place]  Date  02/25/18  Educator  Coad  Instruction Review Code  2- Demonstrated Understanding      Angina -Discuss definition of angina, causes of angina, treatment of angina, and how to decrease risk of having angina.   Cardiac Medications -Review what the following cardiac medications are used for, how they affect the body, and side effects that may occur when taking the medications.  Medications include Aspirin, Beta blockers, calcium channel blockers, ACE Inhibitors, angiotensin  receptor blockers, diuretics, digoxin, and antihyperlipidemics.   CARDIAC REHAB PHASE II EXERCISE from 04/01/2018 in [place]  Date  03/11/18  Educator  Etheleen Mayhew  Instruction Review Code  2- Demonstrated Understanding      Congestive Heart Failure -Discuss the definition of CHF, how to live with CHF, the signs and symptoms of CHF, and how keep track of weight and sodium intake.   CARDIAC REHAB PHASE II EXERCISE from 04/01/2018 in [place]  Date  03/18/18  Educator  Etheleen Mayhew  Instruction Review Code  2- Demonstrated Understanding      Heart Disease and Intimacy -Discus the effect sexual activity has on the heart, how changes occur during intimacy as we age, and safety during sexual activity.   CARDIAC REHAB PHASE II EXERCISE from 04/01/2018 in [place]  Date  03/25/18  Educator  Etheleen Mayhew  Instruction Review Code  2- Demonstrated Understanding      Smoking Cessation / COPD -Discuss different methods to quit smoking, the health benefits of quitting smoking, and the definition of COPD.   CARDIAC REHAB PHASE II EXERCISE from 04/01/2018 in [place]  Date  04/01/18  Educator  Wynetta Emery  Instruction Review Code  2- Demonstrated Understanding      Nutrition I: Fats -Discuss the types of cholesterol, what cholesterol does to the heart, and how cholesterol levels can be controlled.   CARDIAC REHAB PHASE II EXERCISE from 04/01/2018 in [place]  Date  01/07/18  Educator  D. Coad  Instruction Review Code  2- Demonstrated Understanding      Nutrition II: Labels -Discuss the different components of food labels and how to read food label   [place] from 04/01/2018 in [place]  Date  01/14/18  Educator  Etheleen Mayhew  Instruction Review Code  2- Demonstrated Understanding      Heart Parts/Heart Disease and  PAD -Discuss the anatomy of the heart, the pathway of blood circulation through the heart, and these are affected by heart disease.   CARDIAC REHAB PHASE II EXERCISE from 04/01/2018 in [place]  Date  01/21/18  Educator  Etheleen Mayhew  Instruction Review Code  2- Demonstrated Understanding      Stress I: Signs and Symptoms -Discuss the causes of stress, how stress may lead to anxiety and depression, and ways to limit stress.   CARDIAC REHAB PHASE II EXERCISE from 04/01/2018 in [place]  Date  01/28/18  Educator  D. Coad  Instruction Review Code  2- Demonstrated Understanding      Stress II: Relaxation -Discuss different types of relaxation techniques to limit stress.   CARDIAC REHAB PHASE II EXERCISE from 04/01/2018 in [place]  Date  02/04/18  Educator  D. Coad  Instruction Review Code  2- Demonstrated Understanding      Warning Signs of Stroke / TIA -Discuss definition of a stroke, what the signs and symptoms are of a stroke, and how to identify when someone is having stroke.   Knowledge Questionnaire Score: Knowledge Questionnaire Score - 01/05/18 0942      Knowledge Questionnaire Score   Pre Score  25/28       Core Components/Risk Factors/Patient Goals at Admission: Personal Goals and Risk Factors at Admission - 01/05/18 0944      Core Components/Risk Factors/Patient Goals on Admission    Weight Management  Weight Maintenance    Tobacco Cessation  Yes    Intervention  Assist the participant in steps to quit. Provide individualized education and counseling about committing to Tobacco Cessation, relapse prevention, and pharmacological support that can be provided by physician.;Advice worker, assist with locating and accessing local/national Quit Smoking programs, and support quit date choice.    Expected Outcomes  Short Term: Will demonstrate readiness to quit, by selecting a quit  date.    Personal Goal Other  Yes    Personal Goal  Learn more about heart heatlhy eating; increase strength; get strong enough to get back to work. Quit smoking or reduce his smoking.     Intervention  Patient will attend CR 3 days/week and supplement with exercise 2 days/week at home by walking.     Expected Outcomes  Patient will meet his personal goals.        Core Components/Risk Factors/Patient Goals Review:  Goals and Risk Factor Review    Row Name 01/21/18 1511 02/19/18 0734 03/16/18 0746 04/01/18 1353       Core Components/Risk Factors/Patient Goals Review   Personal Goals Review  Tobacco Cessation;Weight Management/Obesity Learn how to eat healthier; increase strength; quit smoking or decrease smoking. Get back to work.   Tobacco Cessation;Weight Management/Obesity Learn how to eat healthier; increase strength; quit smoking or reduce smoking; get back to wrok.   Tobacco Cessation;Weight Management/Obesity Learn how to eat healthier; increase strength; quit smoking ro decrease usuage; get back to work.   Tobacco Cessation;Weight Management/Obesity Learn how to eat healthier; increase strength; quit smoking or decrease usage; get back to work.     Review  Patient has completed 7 sessions losing one lb since his orientation visit. He is doing well in the program with some progression. He did meet with RD and said he learned some tips about eating healthy. He continues to say he is not ready to quit smoking yet and says he is not interested in a smoking cessation class. Will continue to encourage patient to quit smoking.   Patient has completed 15 sessions losing 2 lbs since last 30 day review. He continues to do well in the program with progression. He says he is learning how to  eat healthier. He does feel stronger but has not returned back to work. He has not quit smoking and continues to say he is not interested in a smoking cessation class. He does not cough as much during exercise as when  he started the program. Will continue to monitor for progress.   Patient has completed 21 sessions gaining 2 lbs since last 30 day review. He continues to do well in the program with progression. His smoking usage has not changed. He says he is not interested in any smoking cessation classes at this time. Information was provided on smoking cessation. He says he does feel his strength is increasing. He has not returned to work yet but does feel he is strong enough. Will continue to monitor.   Patient has completed 29 sessions losing 3 lbs since last 30 day review. He continues to do well in the program with progression. He continues to smoke the same amount and continues to say he is not interested in smoking cessation information. He says he is feeing stronger and has more energy. He has not returned to work. He says he has learned more about healthy eating. Will continue to monitor for progress.     Expected Outcomes  Patient will continue to attend sessions and complete the program meeting his personal goals.   Patient will continue to attend sessions and complete the program meeting his personal goals.   Patient will continue to attend sessions and complete the program meeting his personal goals.   Patient will continue to attend sessions and complete the program meeting his personal goals.        Core Components/Risk Factors/Patient Goals at Discharge (Final Review):  Goals and Risk Factor Review - 04/01/18 1353      Core Components/Risk Factors/Patient Goals Review   Personal Goals Review  Tobacco Cessation;Weight Management/Obesity   Learn how to eat healthier; increase strength; quit smoking or decrease usage; get back to work.    Review  Patient has completed 29 sessions losing 3 lbs since last 30 day review. He continues to do well in the program with progression. He continues to smoke the same amount and continues to say he is not interested in smoking cessation information. He says he is  feeing stronger and has more energy. He has not returned to work. He says he has learned more about healthy eating. Will continue to monitor for progress.     Expected Outcomes  Patient will continue to attend sessions and complete the program meeting his personal goals.        ITP Comments: ITP Comments    Row Name 01/05/18 0927           ITP Comments  Patient is a 67 yr old male referred by Dr. 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 He is currenlty smoking 3/4 ppd. He is interested iin quiting but not actively trying to quit. He has no barriers to CR. He has COPD treated with inhalers.           Comments: ITP REVIEW Patient doing well in the program. Will continue to monitor for progress.

## 2018-04-01 NOTE — Progress Notes (Signed)
Daily Session Note  Patient Details  Name: Adam Bass MRN: [id number] Date of Birth: [birth year] Referring Provider:     CARDIAC REHAB PHASE II ORIENTATION from 01/05/2018 in [place]  Referring Provider  [name]      Encounter Date: 04/01/2018  Check In: Session Check In - 04/01/18 0930      Check-In   Supervising physician immediately available to respond to emergencies  See telemetry face sheet for immediately available ER MD    Location  [facility]    Staff Present  Aundra Dubin, RN, Cory Munch, Exercise Physiologist;Diane Coad, MS, EP, [facility], Exercise Physiologist    Medication changes reported      No    Fall or balance concerns reported     No    Tobacco Cessation  No Change    Warm-up and Cool-down  Performed as group-led instruction    Resistance Training Performed  Yes    VAD Patient?  No    PAD/SET Patient?  No      Pain Assessment   Currently in Pain?  No/denies    Pain Score  0-No pain    Multiple Pain Sites  No       Capillary Blood Glucose: No results found for this or any previous visit (from the past 24 hour(s)).    Social History   Tobacco Use  Smoking Status Current Every Day Smoker  . Packs/day: 0.75  . Years: 50.00  . Pack years: 37.50  . Types: Cigars  Smokeless Tobacco Never Used  Tobacco Comment   he is wearing a patch. He is smoking about 5-10 cigarettes daily.     Goals Met:  Independence with exercise equipment Exercise tolerated well No report of cardiac concerns or symptoms Strength training completed today  Goals Unmet:  Not Applicable  Comments: Pt able to follow exercise prescription today without complaint.  Will continue to monitor for progression. Check out 1030.   Dr. Kate Sable is Medical Director for [facility] Cardiac and Pulmonary Rehab.

## 2018-04-03 ENCOUNTER — Encounter (HOSPITAL_COMMUNITY): Payer: BLUE CROSS/BLUE SHIELD

## 2018-04-06 ENCOUNTER — Encounter (HOSPITAL_COMMUNITY)
Admission: RE | Admit: 2018-04-06 | Discharge: 2018-04-06 | Disposition: A | Discharge status: Home / Self Care | Payer: BLUE CROSS/BLUE SHIELD | Source: Ambulatory Visit | Attending: Interventional Cardiology | Admitting: Interventional Cardiology

## 2018-04-06 DIAGNOSIS — Z955 Presence of coronary angioplasty implant and graft: Secondary | ICD-10-CM | POA: Diagnosis not present

## 2018-04-06 DIAGNOSIS — I2119 ST elevation (STEMI) myocardial infarction involving other coronary artery of inferior wall: Secondary | ICD-10-CM

## 2018-04-06 NOTE — Progress Notes (Signed)
Daily Session Note  Patient Details  Name: Adam Bass MRN: [id number] Date of Birth: 02/25/1951 Referring Provider:     CARDIAC REHAB PHASE II ORIENTATION from 01/05/2018 in [place]  Referring Provider  [name]      Encounter Date: 04/06/2018  Check In: Session Check In - 04/06/18 0930      Check-In   Supervising physician immediately available to respond to emergencies  See telemetry face sheet for immediately available ER MD    Location  [facility]    Staff Present  Aundra Dubin, RN, Cory Munch, Exercise Physiologist;Diane Coad, MS, EP, [facility], Exercise Physiologist    Medication changes reported      No    Fall or balance concerns reported     No    Tobacco Cessation  No Change    Warm-up and Cool-down  Performed as group-led instruction    Resistance Training Performed  Yes    VAD Patient?  No    PAD/SET Patient?  No      Pain Assessment   Currently in Pain?  No/denies    Pain Score  0-No pain    Multiple Pain Sites  No       Capillary Blood Glucose: No results found for this or any previous visit (from the past 24 hour(s)).    Social History   Tobacco Use  Smoking Status Current Every Day Smoker  . Packs/day: 0.75  . Years: 50.00  . Pack years: 37.50  . Types: Cigars  Smokeless Tobacco Never Used  Tobacco Comment   he is wearing a patch. He is smoking about 5-10 cigarettes daily.     Goals Met:  Independence with exercise equipment Exercise tolerated well No report of cardiac concerns or symptoms Strength training completed today  Goals Unmet:  Not Applicable  Comments: Pt able to follow exercise prescription today without complaint.  Will continue to monitor for progression. Check out 1030.   Dr. Kate Sable is Medical Director for [facility] Cardiac and Pulmonary Rehab.

## 2018-04-08 ENCOUNTER — Encounter (HOSPITAL_COMMUNITY): Payer: BLUE CROSS/BLUE SHIELD

## 2018-04-10 ENCOUNTER — Encounter (HOSPITAL_COMMUNITY): Payer: BLUE CROSS/BLUE SHIELD

## 2018-04-13 ENCOUNTER — Encounter (HOSPITAL_COMMUNITY): Payer: BLUE CROSS/BLUE SHIELD

## 2018-04-13 DIAGNOSIS — C787 Secondary malignant neoplasm of liver and intrahepatic bile duct: Secondary | ICD-10-CM | POA: Diagnosis not present

## 2018-04-13 DIAGNOSIS — C3491 Malignant neoplasm of unspecified part of right bronchus or lung: Secondary | ICD-10-CM | POA: Diagnosis not present

## 2018-04-13 DIAGNOSIS — I2510 Atherosclerotic heart disease of native coronary artery without angina pectoris: Secondary | ICD-10-CM | POA: Diagnosis not present

## 2018-04-13 DIAGNOSIS — J441 Chronic obstructive pulmonary disease with (acute) exacerbation: Secondary | ICD-10-CM | POA: Diagnosis not present

## 2018-04-15 ENCOUNTER — Encounter (HOSPITAL_COMMUNITY): Payer: BLUE CROSS/BLUE SHIELD

## 2018-04-16 ENCOUNTER — Telehealth: Payer: Self-pay | Admitting: Cardiology

## 2018-04-16 NOTE — Telephone Encounter (Signed)
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   Kerin Ransom PA-C 04/16/2018 3:22 PM

## 2018-04-16 NOTE — Telephone Encounter (Signed)
New message       [facility] Pre-operative Risk Assessment    Request for surgical clearance:  1. What type of surgery is being performed? thermal liver mass  2. When is this surgery scheduled? tbd   3. What type of clearance is required (medical clearance vs. Pharmacy clearance to hold med vs. Both)? Pharmacy   4. Are there any medications that need to be held prior to surgery and how long? Brilinta    5. Practice name and name of physician performing surgery? [facility]   6. What is your office phone number? [phone]    7.   What is your office fax number  8.   Anesthesia type (None, local, MAC, general) ?    Howie Ill 04/16/2018, 9:45 AM  _________________________________________________________________   (provider comments below)

## 2018-04-17 NOTE — Telephone Encounter (Signed)
Dr [name] could you weigh in on this- the patient needs a liver biopsy for a mass- he just had and MI Aug 2019- RCA thrombectomy and DES.   Kerin Ransom PA-C 04/17/2018 11:35 AM

## 2018-04-17 NOTE — Telephone Encounter (Signed)
I will defer to patient's primary cardiologist.

## 2018-04-17 NOTE — Telephone Encounter (Signed)
OK to hold Brilinta for 5 days prior to biopsy.  Synergy stent was used so Brilinta can be held sooner than normal, compared to other DES. Would restart Brilinta when safe from a bleeding standpoint.

## 2018-04-17 NOTE — Telephone Encounter (Signed)
   Primary Cardiologist: Carlyle Dolly, MD  Chart reviewed as part of pre-operative protocol coverage. Given past medical history and time since last visit, based on ACC/AHA guidelines, Adam Bass would be at acceptable risk for the planned procedure without further cardiovascular testing.   OK to hold Brilinta for 5 days prior to biopsy.  Synergy stent was used so Brilinta can be held sooner than normal, compared to other DES. Would restart Brilinta when safe from a bleeding standpoint.   I will route this recommendation to the requesting party via Epic fax function and remove from pre-op pool.  Please call with questions.  Kerin Ransom, PA-C 04/17/2018, 2:21 PM

## 2018-04-20 ENCOUNTER — Encounter: Payer: Self-pay | Admitting: Cardiology

## 2018-04-20 ENCOUNTER — Ambulatory Visit: Payer: BLUE CROSS/BLUE SHIELD | Admitting: Cardiology

## 2018-04-20 VITALS — BP 118/60 | HR 77 | Ht 69.0 in | Wt 146.0 lb

## 2018-04-20 DIAGNOSIS — E782 Mixed hyperlipidemia: Secondary | ICD-10-CM

## 2018-04-20 DIAGNOSIS — Z79899 Other long term (current) drug therapy: Secondary | ICD-10-CM | POA: Diagnosis not present

## 2018-04-20 DIAGNOSIS — I2510 Atherosclerotic heart disease of native coronary artery without angina pectoris: Secondary | ICD-10-CM | POA: Diagnosis not present

## 2018-04-20 MED ORDER — LISINOPRIL 2.5 MG PO TABS: 2.5000 mg | ORAL_TABLET | Freq: Every day | ORAL | 3 refills | Status: DC

## 2018-04-20 NOTE — Patient Instructions (Signed)
Medication Instructions:  Your physician has recommended you make the following change in your medication:  Start Lisinopril 2.5 mg Daily   If you need a refill on your cardiac medications before your next appointment, please call your pharmacy.   Lab work: Your physician recommends that you return for lab work in: 2 Weeks (05/04/18)   If you have labs (blood work) drawn today and your tests are completely normal, you will receive your results only by: Marland Kitchen MyChart Message (if you have MyChart) OR . A paper copy in the mail If you have any lab test that is abnormal or we need to change your treatment, we will call you to review the results.  Testing/Procedures: NONE   Follow-Up: At [facility], you and your health needs are our priority.  As part of our continuing mission to provide you with exceptional heart care, we have created designated Provider Care Teams.  These Care Teams include your primary Cardiologist (physician) and Advanced Practice Providers (APPs -  Physician Assistants and Nurse Practitioners) who all work together to provide you with the care you need, when you need it. You will need a follow up appointment in 4 months.  Please call our office 2 months in advance to schedule this appointment.  You may see Carlyle Dolly, MD or one of the following Advanced Practice Providers on your designated Care Team:   Bernerd Pho, PA-C [facility]) . Ermalinda Barrios, PA-C ([place])  Any Other Special Instructions Will Be Listed Below (If Applicable). Thank you for choosing [facility]!

## 2018-04-20 NOTE — Progress Notes (Signed)
   Clinical Summary Mr. Wyrick is a 67 y.o.male seen today for follow up of the following medical problems.     1. CAD - admit 11/2017 with inferior STEMI, mid RCA 100% lesion received thrombectomy and DES - 11/2017 echo LVEF 55-60% - medical therapy limited by soft bp's. Started on high dost statin but had LFT elevation and discontinued. Started on zetia 10mg daily.  - on last check LFTs were still mildly elevated, continue to hold on statin.    - needs liver biopsy. Documentation of discussion between [facility] and interventional cardiology Dr Varanassi ok to hold, I would agree since 6 months out from procedure.   - no recent chest pain. Participating in cardiac rehab, tolerating well - compliant with meds   2. Elevated LFTs  followed by GI, off statin  3. Tobacco history - +50 years. Not ready to quit - 2016 MRA no aneurysm.  - 02/2018 MRI abd ectatic aorta  4. Liver mass - followed by GI - needs liver bx    5. Ectatic abdominal aorta - noted on 02/2018 MRI abdomen  6. Lung cancer - followed by oncology, stable recent scan and in remission.    Past Medical History:  Diagnosis Date  . COPD (chronic obstructive pulmonary disease) (HCC)    for 10 years or more   . History of radiation therapy 06/03/17- 06/09/17   Left Lung treated to 54 Gy with 3 fx of 18 Gy. SBRT  . Lung cancer (HCC)   . Myocardial infarction (HCC)   . Skin cancer of face 07/2015   skin cancer removed from face by Dr. Beavers in [place]     Allergies  Allergen Reactions  . Bee Venom Hives     Current Outpatient Medications  Medication Sig Dispense Refill  . albuterol (PROAIR HFA) 108 (90 BASE) MCG/ACT inhaler Inhale 2 puffs into the lungs every 6 (six) hours as needed for wheezing.    . aspirin EC 81 MG tablet Take 81 mg by mouth daily.    . bisoprolol (ZEBETA) 5 MG tablet Take 0.5 tablets (2.5 mg total) by mouth daily. 60 tablet 1  . Blood Pressure KIT 1 Units by Does not apply route  daily. 1 each 0  . ezetimibe (ZETIA) 10 MG tablet Take 1 tablet (10 mg total) by mouth daily. 90 tablet 1  . fluticasone (FLONASE) 50 MCG/ACT nasal spray Place 1 spray into both nostrils daily.    . LORazepam (ATIVAN) 0.5 MG tablet Take 1 tablet (0.5 mg total) by mouth every 6 (six) hours as needed for anxiety. 25 tablet 0  . Multiple Vitamins-Minerals (MULTIVITAMIN WITH MINERALS) tablet Take 1 tablet by mouth daily.    . nitroGLYCERIN (NITROSTAT) 0.4 MG SL tablet Place 1 tablet (0.4 mg total) under the tongue every 5 (five) minutes as needed. 25 tablet 3  . pantoprazole (PROTONIX) 40 MG tablet TK 1 T PO QD  5  . Potassium 99 MG TABS Take 1 tablet by mouth daily.     . tamsulosin (FLOMAX) 0.4 MG CAPS capsule Take 0.4 mg by mouth daily.    . Tetrahydrozoline HCl (VISINE OP) Place 1 drop into both eyes daily as needed (Dry Eyes).    . ticagrelor (BRILINTA) 90 MG TABS tablet Take 1 tablet (90 mg total) by mouth 2 (two) times daily. 180 tablet 2  . TRELEGY ELLIPTA 100-62.5-25 MCG/INH AEPB INHALE 1 PUFF INTO THE LUNGS EVERY DAY 60 each 11  . vitamin E 400   UNIT capsule Take 1 tablet by mouth daily.      No current facility-administered medications for this visit.      Past Surgical History:  Procedure Laterality Date  . COLONOSCOPY  2004   Dr. Jenkins-[facility]  . COLONOSCOPY N/A 08/27/2012   Procedure: COLONOSCOPY;  Surgeon: Najeeb U Rehman, MD;  Location: [facility];  Service: Endoscopy;  Laterality: N/A;  1200  . CORONARY STENT INTERVENTION N/A 11/23/2017   Procedure: CORONARY STENT INTERVENTION;  Surgeon: Varanasi, Jayadeep S, MD;  Location: [facility];  Service: Cardiovascular;  Laterality: N/A;  . IR RADIOLOGIST EVAL & MGMT  03/04/2018  . LEFT HEART CATH AND CORONARY ANGIOGRAPHY N/A 11/23/2017   Procedure: LEFT HEART CATH AND CORONARY ANGIOGRAPHY;  Surgeon: Varanasi, Jayadeep S, MD;  Location: [facility];  Service: Cardiovascular;  Laterality: N/A;     Allergies  Allergen  Reactions  . Bee Venom Hives      Family History  Problem Relation Age of Onset  . Breast cancer Sister   . Colon cancer Neg Hx      Social History Mr. Bhattacharyya reports that he has been smoking cigars. He has a 37.50 pack-year smoking history. He has never used smokeless tobacco. Mr. Anger reports current alcohol use of about 8.0 standard drinks of alcohol per week.   Review of Systems CONSTITUTIONAL: No weight loss, fever, chills, weakness or fatigue.  HEENT: Eyes: No visual loss, blurred vision, double vision or yellow sclerae.No hearing loss, sneezing, congestion, runny nose or sore throat.  SKIN: No rash or itching.  CARDIOVASCULAR: per hpi RESPIRATORY: No shortness of breath, cough or sputum.  GASTROINTESTINAL: No anorexia, nausea, vomiting or diarrhea. No abdominal pain or blood.  GENITOURINARY: No burning on urination, no polyuria NEUROLOGICAL: No headache, dizziness, syncope, paralysis, ataxia, numbness or tingling in the extremities. No change in bowel or bladder control.  MUSCULOSKELETAL: No muscle, back pain, joint pain or stiffness.  LYMPHATICS: No enlarged nodes. No history of splenectomy.  PSYCHIATRIC: No history of depression or anxiety.  ENDOCRINOLOGIC: No reports of sweating, cold or heat intolerance. No polyuria or polydipsia.  .   Physical Examination Vitals:   04/20/18 1111  BP: 118/60  Pulse: 77  SpO2: 97%   Vitals:   04/20/18 1111  Weight: 146 lb (66.2 kg)  Height: 5' 9" (1.753 m)    Gen: resting comfortably, no acute distress HEENT: no scleral icterus, pupils equal round and reactive, no palptable cervical adenopathy,  CV: RRR, no m/r/g, no jvd Resp: Clear to auscultation bilaterally GI: abdomen is soft, non-tender, non-distended, normal bowel sounds, no hepatosplenomegaly MSK: extremities are warm, no edema.  Skin: warm, no rash Neuro:  no focal deficits Psych: appropriate affect   Diagnostic Studies     Assessment and Plan  1.  CAD - no recent symptoms - bp's have improved, try starting low dose lisinopril 2.5mg daily, check BMET in 2 weeks - ok to hold brillinta as needed for his biopsy, restart after procedure.    2. Elevated LFTs - continue to follow with GI  3. Hyperlipidemia - off statin due to elevated LFTs and liver disease - continue zetia, once GI workup complete would ask them about statin. Recheck lipid panel   F/u 4 months     Jonathan F. Branch, M.D. 

## 2018-04-22 ENCOUNTER — Telehealth: Payer: Self-pay | Admitting: Cardiology

## 2018-04-22 NOTE — Telephone Encounter (Signed)
New Message    [facility] states it's ok to hold Brilinta for 5 days for Biopsy, but patient actually having a Thermal Ablation and they need it corrected.

## 2018-04-23 NOTE — Progress Notes (Signed)
Cardiac Individual Treatment Plan  Patient Details  Name: Adam Bass MRN: [id number] Date of Birth: 1951/02/07 Referring Provider:     CARDIAC REHAB PHASE II ORIENTATION from 01/05/2018 in [facility]  Referring Provider  [name]      Initial Encounter Date:    CARDIAC REHAB PHASE II ORIENTATION from 01/05/2018 in [facility]  Date  01/05/18      Visit Diagnosis: ST elevation myocardial infarction (STEMI) involving other coronary artery of inferior wall (HCC)  Status post coronary artery stent placement  Patient's Home Medications on Admission:  Current Outpatient Medications:  .  albuterol (PROAIR HFA) 108 (90 Base) MCG/ACT inhaler, Inhale 2 puffs into the lungs every 6 (six) hours as needed for wheezing. , Disp: , Rfl:  .  aspirin EC 81 MG tablet, Take 81 mg by mouth daily., Disp: , Rfl:  .  bisoprolol (ZEBETA) 5 MG tablet, Take 0.5 tablets (2.5 mg total) by mouth daily., Disp: 60 tablet, Rfl: 1 .  Blood Pressure KIT, 1 Units by Does not apply route daily., Disp: 1 each, Rfl: 0 .  ezetimibe (ZETIA) 10 MG tablet, Take 1 tablet (10 mg total) by mouth daily., Disp: 90 tablet, Rfl: 1 .  fluticasone (FLONASE) 50 MCG/ACT nasal spray, Place 1 spray into both nostrils daily., Disp: , Rfl:  .  Glecaprevir-Pibrentasvir (MAVYRET) 100-40 MG TABS, Take by mouth 3 (three) times daily., Disp: , Rfl:  .  lisinopril (PRINIVIL,ZESTRIL) 2.5 MG tablet, Take 1 tablet (2.5 mg total) by mouth daily., Disp: 90 tablet, Rfl: 3 .  LORazepam (ATIVAN) 0.5 MG tablet, Take 1 tablet (0.5 mg total) by mouth every 6 (six) hours as needed for anxiety., Disp: 25 tablet, Rfl: 0 .  Multiple Vitamins-Minerals (MULTIVITAMIN WITH MINERALS) tablet, Take 1 tablet by mouth daily., Disp: , Rfl:  .  nitroGLYCERIN (NITROSTAT) 0.4 MG SL tablet, Place 1 tablet (0.4 mg total) under the tongue every 5 (five) minutes as needed., Disp: 25 tablet, Rfl: 3 .  pantoprazole (PROTONIX) 40  MG tablet, TK 1 T PO QD, Disp: , Rfl: 5 .  Potassium 99 MG TABS, Take 1 tablet by mouth daily. , Disp: , Rfl:  .  tamsulosin (FLOMAX) 0.4 MG CAPS capsule, Take 0.4 mg by mouth daily., Disp: , Rfl:  .  Tetrahydrozoline HCl (VISINE OP), Place 1 drop into both eyes daily as needed ([place])., Disp: , Rfl:  .  ticagrelor (BRILINTA) 90 MG TABS tablet, Take 1 tablet (90 mg total) by mouth 2 (two) times daily., Disp: 180 tablet, Rfl: 2 .  TRELEGY ELLIPTA 100-62.5-25 MCG/INH AEPB, INHALE 1 PUFF INTO THE LUNGS EVERY DAY, Disp: 60 each, Rfl: 11 .  vitamin E 400 UNIT capsule, Take 1 tablet by mouth daily. , Disp: , Rfl:   Past Medical History: Past Medical History:  Diagnosis Date  . COPD (chronic obstructive pulmonary disease) ([place])    for 10 years or more   . History of radiation therapy 06/03/17- 06/09/17   Left Lung treated to 54 Gy with 3 fx of 18 Gy. SBRT  . Lung cancer ([place])   . Myocardial infarction ([place])   . Skin cancer of face 07/2015   skin cancer removed from face by Dr. Tarri Glenn in [place]    Tobacco Use: Social History   Tobacco Use  Smoking Status Current Every Day Smoker  . Packs/day: 0.75  . Years: 50.00  . Pack years: 37.50  . Types: Cigars  Smokeless Tobacco Never  Used  Tobacco Comment   he is wearing a patch. He is smoking about 5-10 cigarettes daily.     Labs: Recent Review Flowsheet Data    Labs for ITP Cardiac and Pulmonary Rehab Latest Ref Rng & Units 05/06/2017 11/23/2017 11/24/2017   Cholestrol 0 - 200 mg/dL - 146 140   LDLCALC 0 - 99 mg/dL - 100(H) 84   HDL >40 mg/dL - 35(L) 33(L)   Trlycerides <150 mg/dL - 53 116   Hemoglobin A1c 4.8 - 5.6 % - - 5.0   PHART 7.350 - 7.450 7.444 - -   PCO2ART 32.0 - 48.0 mmHg 38.9 - -   HCO3 20.0 - 28.0 mmol/L 26.7 - -   TCO2 22 - 32 mmol/L - 23 -   O2SAT % 95.9 - -      Capillary Blood Glucose: Lab Results  Component Value Date   GLUCAP 104 (H) 04/02/2017   GLUCAP 112 (H) 10/02/2016     Exercise Target  Goals: Exercise Program Goal: Individual exercise prescription set using results from initial 6 min walk test and THRR while considering  patient's activity barriers and safety.   Exercise Prescription Goal: Starting with aerobic activity 30 plus minutes a day, 3 days per week for initial exercise prescription. Provide home exercise prescription and guidelines that participant acknowledges understanding prior to discharge.  Activity Barriers & Risk Stratification: Activity Barriers & Cardiac Risk Stratification - 01/05/18 1029      Activity Barriers & Cardiac Risk Stratification   Activity Barriers  Joint Problems;Deconditioning;Muscular Weakness;Shortness of Breath    Cardiac Risk Stratification  High       6 Minute Walk: 6 Minute Walk    Row Name 01/05/18 1028         6 Minute Walk   Phase  Initial     Distance  1000 feet     Walk Time  6 minutes     # of Rest Breaks  0     MPH  1.89     METS  2.45     RPE  13     Perceived Dyspnea   13     VO2 Peak  10.25     Symptoms  Yes (comment)     Comments  4/10 bilateral hip pain      Resting HR  64 bpm     Resting BP  110/60     Resting Oxygen Saturation   95 %     Exercise Oxygen Saturation  during 6 min walk  92 %     Max Ex. HR  91 bpm     Max Ex. BP  138/68     2 Minute Post BP  114/66        Oxygen Initial Assessment:   Oxygen Re-Evaluation:   Oxygen Discharge (Final Oxygen Re-Evaluation):   Initial Exercise Prescription: Initial Exercise Prescription - 01/05/18 1000      Date of Initial Exercise RX and Referring Provider   Date  01/05/18    Referring Provider  [facility]    Expected Discharge Date  04/06/18      NuStep   Level  1    SPM  55    Minutes  17    METs  1.9      Arm Ergometer   Level  1.5    Watts  11    RPM  41    Minutes  17    METs  1.7  Prescription Details   Frequency (times per week)  3    Duration  Progress to 30 minutes of continuous aerobic without signs/symptoms of  physical distress      Intensity   THRR 40-80% of Max Heartrate  [phone]    Ratings of Perceived Exertion  11-13    Perceived Dyspnea  0-4      Progression   Progression  Continue progressive overload as per policy without signs/symptoms or physical distress.      Resistance Training   Training Prescription  Yes    Weight  1    Reps  10-15       Perform Capillary Blood Glucose checks as needed.  Exercise Prescription Changes:  Exercise Prescription Changes    Row Name 01/05/18 1000 02/03/18 1100 03/05/18 1200 03/18/18 1400 03/31/18 1500     Response to Exercise   Blood Pressure (Admit)  -  112/58  110/60  118/60  110/62   Blood Pressure (Exercise)  -  132/60  138/70  150/72  140/62   Blood Pressure (Exit)  -  116/60  118/60  114/62  104/60   Heart Rate (Admit)  -  59 bpm  64 bpm  62 bpm  61 bpm   Heart Rate (Exercise)  -  72 bpm  105 bpm  70 bpm  80 bpm   Heart Rate (Exit)  -  67 bpm  70 bpm  70 bpm  69 bpm   Rating of Perceived Exertion (Exercise)  -  11  11  11  11    Comments  -  -  -  increase in overall MET level   -   Duration  -  Progress to 30 minutes of  aerobic without signs/symptoms of physical distress  Progress to 30 minutes of  aerobic without signs/symptoms of physical distress  Progress to 30 minutes of  aerobic without signs/symptoms of physical distress  Progress to 30 minutes of  aerobic without signs/symptoms of physical distress   Intensity  -  THRR New 99-117-135  THRR unchanged  THRR unchanged  THRR unchanged     Progression   Progression  -  Continue to progress workloads to maintain intensity without signs/symptoms of physical distress.  Continue to progress workloads to maintain intensity without signs/symptoms of physical distress.  Continue to progress workloads to maintain intensity without signs/symptoms of physical distress.  Continue to progress workloads to maintain intensity without signs/symptoms of physical distress.   Average METs  -  1.9   2.15  2.4  2.3     Resistance Training   Training Prescription  -  Yes  Yes  Yes  Yes   Weight  -  2  3  3  3    Reps  -  10-15  10-15  10-15  10-15     NuStep   Level  -  2  2  2  2    SPM  -  66  76  69  73   Minutes  -  22  22  22  22    METs  -  1.7  1.8  1.7  1.9     Arm Ergometer   Level  -  2  2.5  3  3    Watts  -  11  16  22  17    RPM  -  44  50  54  50   Minutes  -  17  17  17   17  METs  -  2.1  2.5  3.1  2.7     Home Exercise Plan   Plans to continue exercise at  Home (comment) walking   Home (comment)  Home (comment)  Home (comment)  Home (comment)   Frequency  Add 2 additional days to program exercise sessions.  Add 2 additional days to program exercise sessions.  Add 2 additional days to program exercise sessions.  Add 2 additional days to program exercise sessions.  Add 2 additional days to program exercise sessions.   Initial Home Exercises Provided  01/05/18  01/05/18  01/05/18  01/05/18  01/05/18   Row Name 04/16/18 0700             Response to Exercise   Blood Pressure (Admit)  100/60       Blood Pressure (Exercise)  142/72       Blood Pressure (Exit)  108/58       Heart Rate (Admit)  74 bpm       Heart Rate (Exercise)  76 bpm       Heart Rate (Exit)  69 bpm       Rating of Perceived Exertion (Exercise)  11       Comments  has been out since 04/06/18       Duration  Continue with 30 min of aerobic exercise without signs/symptoms of physical distress.       Intensity  THRR unchanged         Progression   Progression  Continue to progress workloads to maintain intensity without signs/symptoms of physical distress.       Average METs  2.15         Resistance Training   Training Prescription  Yes       Weight  3       Reps  10-15         NuStep   Level  3       SPM  68       Minutes  22       METs  1.8         Arm Ergometer   Level  3       Watts  17       RPM  48       Minutes  17       METs  2.5         Home Exercise Plan   Plans to  continue exercise at  Home (comment)       Frequency  Add 2 additional days to program exercise sessions.       Initial Home Exercises Provided  01/05/18          Exercise Comments:  Exercise Comments    Row Name 01/19/18 1518 02/18/18 1430 03/09/18 1527 03/31/18 1528 04/21/18 1454   Exercise Comments  Patient has done well in the program so far. He wants to get strong enough to return to work.   Patient feels he has gotten stronger. He is still hoping this helps him make it back to work.   Patient feels stronger but has been out some due to illness. He is having a lot of trouble breathing. We will monitor progress.   Pt has improved his breathing the last 2 weeks. He has tolerated the exercise well so we will progress him as necessary.   Pt. has been out of rehab, sick, since 04/06/2018. Upon his return we will monitor his progress  and adjust his workloads as necessary.       Exercise Goals and Review:  Exercise Goals    Row Name 01/05/18 1041             Exercise Goals   Increase Physical Activity  Yes       Intervention  Provide advice, education, support and counseling about physical activity/exercise needs.;Develop an individualized exercise prescription for aerobic and resistive training based on initial evaluation findings, risk stratification, comorbidities and participant's personal goals.       Expected Outcomes  Short Term: Attend rehab on a regular basis to increase amount of physical activity.       Increase Strength and Stamina  Yes       Intervention  Provide advice, education, support and counseling about physical activity/exercise needs.;Develop an individualized exercise prescription for aerobic and resistive training based on initial evaluation findings, risk stratification, comorbidities and participant's personal goals.       Expected Outcomes  Short Term: Increase workloads from initial exercise prescription for resistance, speed, and METs.;Short Term: Perform  resistance training exercises routinely during rehab and add in resistance training at home;Long Term: Improve cardiorespiratory fitness, muscular endurance and strength as measured by increased METs and functional capacity (6MWT)       Able to understand and use rate of perceived exertion (RPE) scale  Yes       Intervention  Provide education and explanation on how to use RPE scale       Expected Outcomes  Short Term: Able to use RPE daily in rehab to express subjective intensity level;Long Term:  Able to use RPE to guide intensity level when exercising independently       Able to understand and use Dyspnea scale  Yes       Intervention  Provide education and explanation on how to use Dyspnea scale       Expected Outcomes  Short Term: Able to use Dyspnea scale daily in rehab to express subjective sense of shortness of breath during exertion;Long Term: Able to use Dyspnea scale to guide intensity level when exercising independently       Knowledge and understanding of Target Heart Rate Range (THRR)  Yes       Intervention  Provide education and explanation of THRR including how the numbers were predicted and where they are located for reference       Expected Outcomes  Short Term: Able to state/look up THRR;Long Term: Able to use THRR to govern intensity when exercising independently;Short Term: Able to use daily as guideline for intensity in rehab       Able to check pulse independently  Yes       Intervention  Provide education and demonstration on how to check pulse in carotid and radial arteries.;Review the importance of being able to check your own pulse for safety during independent exercise       Expected Outcomes  Short Term: Able to explain why pulse checking is important during independent exercise;Long Term: Able to check pulse independently and accurately       Understanding of Exercise Prescription  Yes       Intervention  Provide education, explanation, and written materials on patient's  individual exercise prescription       Expected Outcomes  Short Term: Able to explain program exercise prescription;Long Term: Able to explain home exercise prescription to exercise independently          Exercise Goals Re-Evaluation : Exercise Goals Re-Evaluation  [place] Name 01/19/18 1516 02/18/18 1428 03/09/18 1525 03/31/18 1527 04/21/18 1451     Exercise Goal Re-Evaluation   Exercise Goals Review  Increase Physical Activity;Increase Strength and Stamina;Able to understand and use rate of perceived exertion (RPE) scale;Knowledge and understanding of Target Heart Rate Range (THRR);Understanding of Exercise Prescription;Able to understand and use Dyspnea scale;Able to check pulse independently  Increase Physical Activity;Increase Strength and Stamina;Able to understand and use rate of perceived exertion (RPE) scale;Knowledge and understanding of Target Heart Rate Range (THRR);Understanding of Exercise Prescription;Able to understand and use Dyspnea scale;Able to check pulse independently  Increase Physical Activity;Increase Strength and Stamina;Able to understand and use rate of perceived exertion (RPE) scale;Knowledge and understanding of Target Heart Rate Range (THRR);Understanding of Exercise Prescription;Able to understand and use Dyspnea scale;Able to check pulse independently  Increase Physical Activity;Able to understand and use rate of perceived exertion (RPE) scale;Increase Strength and Stamina;Knowledge and understanding of Target Heart Rate Range (THRR);Able to check pulse independently;Understanding of Exercise Prescription  Increase Physical Activity;Able to understand and use rate of perceived exertion (RPE) scale;Increase Strength and Stamina;Knowledge and understanding of Target Heart Rate Range (THRR);Able to check pulse independently;Understanding of Exercise Prescription   Comments  Patient is new to the program. He has completed 6 sessions. He has tolerated exercise well. We will  continue to monitor him and progress as tolerated.   Patient has attended 15 sessions now. He reports feeling stronger and feels this is helping him to be able to do more around the house. He is progressing right on track.   Patient does well in the program when he is feeling well. He has been under the weather the last week or so. He tolerates exercise well when he is able to come.   Pt. has been attending rehab more regularly and feeling better. He works as hard as he can most days but is rather weak which sets him back some. He always pushes himself though to increase his distance on the machines.   Pt. has not been in rehab since 04/06/2018 due to sickness. When he returns we will continue to monitor him and progress as tolerated.    Expected Outcomes  Increase strength, learn to eat healthier.   Increase strength, continue to make healthier eating choices.   Increase strength, continue to make healthier eating choices and decerase smoking.   Increase strength, continue to make healthier eating choices and decerase smoking.   Increase strength, continue to make healthier eating choices and decerase smoking.        Discharge Exercise Prescription (Final Exercise Prescription Changes): Exercise Prescription Changes - 04/16/18 0700      Response to Exercise   Blood Pressure (Admit)  100/60    Blood Pressure (Exercise)  142/72    Blood Pressure (Exit)  108/58    Heart Rate (Admit)  74 bpm    Heart Rate (Exercise)  76 bpm    Heart Rate (Exit)  69 bpm    Rating of Perceived Exertion (Exercise)  11    Comments  has been out since 04/06/18    Duration  Continue with 30 min of aerobic exercise without signs/symptoms of physical distress.    Intensity  THRR unchanged      Progression   Progression  Continue to progress workloads to maintain intensity without signs/symptoms of physical distress.    Average METs  2.15      Resistance Training   Training Prescription  Yes    Weight  3  Reps   10-15      NuStep   Level  3    SPM  68    Minutes  22    METs  1.8      Arm Ergometer   Level  3    Watts  17    RPM  48    Minutes  17    METs  2.5      Home Exercise Plan   Plans to continue exercise at  Home (comment)    Frequency  Add 2 additional days to program exercise sessions.    Initial Home Exercises Provided  01/05/18       Nutrition:  Target Goals: Understanding of nutrition guidelines, daily intake of sodium <1569m, cholesterol <2055m calories 30% from fat and 7% or less from saturated fats, daily to have 5 or more servings of fruits and vegetables.  Biometrics: Pre Biometrics - 01/05/18 1041      Pre Biometrics   Height  5' 9"  (1.753 m)    Weight  64.9 kg    Waist Circumference  30.25 inches    Hip Circumference  32.5 inches    Waist to Hip Ratio  0.93 %    BMI (Calculated)  21.12    Triceps Skinfold  3 mm    % Body Fat  14.2 %    Grip Strength  19.4 kg    Flexibility  16.3 in    Single Leg Stand  5 seconds        Nutrition Therapy Plan and Nutrition Goals: Nutrition Therapy & Goals - 01/15/18 1553      Personal Nutrition Goals   Nutrition Goal  For heart healthy choices add >50% of whole grains, make half their plate fruits and vegetables. Discuss the difference between starchy vegetables and leafy greens, and how leafy vegetables provide fiber, helps maintain healthy weight, helps control blood glucose, and lowers cholesterol.  Discuss purchasing fresh or frozen vegetable to reduce sodium and not to add grease, fat or sugar. Consume <18oz of red meat per week. Consume lean cuts of meats and very little of meats high in sodium and nitrates such as pork and lunch meats. Discussed portion control for all food groups.      Comments  Patient met with RD 01/15/18.      Intervention Plan   Intervention  Nutrition handout(s) given to patient.    Expected Outcomes  Short Term Goal: Understand basic principles of dietary content, such as calories, fat,  sodium, cholesterol and nutrients.       Nutrition Assessments: Nutrition Assessments - 01/05/18 0942      MEDFICTS Scores   Pre Score  50       Nutrition Goals Re-Evaluation: Nutrition Goals Re-Evaluation    Row Name 01/21/18 1510 02/19/18 0734 03/16/18 0746 04/01/18 1352       Goals   Current Weight  142 lb (64.4 kg)  139 lb 14.4 oz (63.5 kg)  141 lb (64 kg)  139 lb 9.6 oz (63.3 kg)    Nutrition Goal  For heart healthy choices add >50% of whole grains, make half their plate fruits and vegetables. Discuss the difference between starchy vegetables and leafy greens, and how leafy vegetables provide fiber, helps maintain healthy weight, helps control blood glucose, and lowers cholesterol.  Discuss purchasing fresh or frozen vegetable to reduce sodium and not to add grease, fat or sugar. Consume <18oz of red meat per week. Consume lean cuts of meats and  very little of meats high in sodium and nitrates such as pork and lunch meats. Discussed portion control for all food groups.    For heart healthy choices add >50% of whole grains, make half their plate fruits and vegetables. Discuss the difference between starchy vegetables and leafy greens, and how leafy vegetables provide fiber, helps maintain healthy weight, helps control blood glucose, and lowers cholesterol.  Discuss purchasing fresh or frozen vegetable to reduce sodium and not to add grease, fat or sugar. Consume <18oz of red meat per week. Consume lean cuts of meats and very little of meats high in sodium and nitrates such as pork and lunch meats. Discussed portion control for all food groups.    For heart healthy choices add >50% of whole grains, make half their plate fruits and vegetables. Discuss the difference between starchy vegetables and leafy greens, and how leafy vegetables provide fiber, helps maintain healthy weight, helps control blood glucose, and lowers cholesterol.  Discuss purchasing fresh or frozen vegetable to reduce sodium  and not to add grease, fat or sugar. Consume <18oz of red meat per week. Consume lean cuts of meats and very little of meats high in sodium and nitrates such as pork and lunch meats. Discussed portion control for all food groups.    For heart healthy choices add >50% of whole grains, make half their plate fruits and vegetables. Discuss the difference between starchy vegetables and leafy greens, and how leafy vegetables provide fiber, helps maintain healthy weight, helps control blood glucose, and lowers cholesterol.  Discuss purchasing fresh or frozen vegetable to reduce sodium and not to add grease, fat or sugar. Consume <18oz of red meat per week. Consume lean cuts of meats and very little of meats high in sodium and nitrates such as pork and lunch meats. Discussed portion control for all food groups.      Comment  Patient has lost 1 lb since his orientation visit. He says he is trying to eat heart healthy. Will continue to monitor for progress.   Patient has lost 2 lbs since last 30 day review. He continues to say he is trying to eat heart healthy.   Patient has gained 2 lbs since last 30 day review. He continues to say he is trying to eat heart healthy.   Patient has lost 2.4 lbs since last 30 day review. He says he is trying to eat healthy. Will continue to monitor for progress.     Expected Outcome  Patient will continue to work toward meeting his nutritional goals.   Patient will continue to work toward meeting his nutritional goals.   Patient will continue to work toward meeting his nutritional goals.   Patient will continue to work toward meeting his nutritional goals.        Nutrition Goals Discharge (Final Nutrition Goals Re-Evaluation): Nutrition Goals Re-Evaluation - 04/01/18 1352      Goals   Current Weight  139 lb 9.6 oz (63.3 kg)    Nutrition Goal  For heart healthy choices add >50% of whole grains, make half their plate fruits and vegetables. Discuss the difference between starchy  vegetables and leafy greens, and how leafy vegetables provide fiber, helps maintain healthy weight, helps control blood glucose, and lowers cholesterol.  Discuss purchasing fresh or frozen vegetable to reduce sodium and not to add grease, fat or sugar. Consume <18oz of red meat per week. Consume lean cuts of meats and very little of meats high in sodium and nitrates such  as pork and lunch meats. Discussed portion control for all food groups.      Comment  Patient has lost 2.4 lbs since last 30 day review. He says he is trying to eat healthy. Will continue to monitor for progress.     Expected Outcome  Patient will continue to work toward meeting his nutritional goals.        Psychosocial: Target Goals: Acknowledge presence or absence of significant depression and/or stress, maximize coping skills, provide positive support system. Participant is able to verbalize types and ability to use techniques and skills needed for reducing stress and depression.  Initial Review & Psychosocial Screening: Initial Psych Review & Screening - 01/05/18 1032      Initial Review   Current issues with  None Identified      Family Dynamics   Good Support System?  Yes    Comments  Patient has good family support from his daughters and his ex-wife and from friends.       Barriers   Psychosocial barriers to participate in program  There are no identifiable barriers or psychosocial needs.      Screening Interventions   Interventions  Encouraged to exercise;Provide feedback about the scores to participant    Expected Outcomes  Long Term goal: The participant improves quality of Life and PHQ9 Scores as seen by post scores and/or verbalization of changes       Quality of Life Scores: Quality of Life - 01/05/18 1043      Quality of Life   Select  Quality of Life      Quality of Life Scores   Health/Function Pre  15.53 %    Socioeconomic Pre  24 %    Psych/Spiritual Pre  24.86 %    Family Pre  27 %    GLOBAL  Pre  20.7 %      Scores of 19 and below usually indicate a poorer quality of life in these areas.  A difference of  2-3 points is a clinically meaningful difference.  A difference of 2-3 points in the total score of the Quality of Life Index has been associated with significant improvement in overall quality of life, self-image, physical symptoms, and general health in studies assessing change in quality of life.  PHQ-9: Recent Review Flowsheet Data    Depression screen [facility] 2/9 01/05/2018 09/12/2017 07/04/2017 04/30/2017   Decreased Interest 0 0 0 0   Down, Depressed, Hopeless 0 0 0 0   PHQ - 2 Score 0 0 0 0   Altered sleeping 1 - - -   Tired, decreased energy 1 - - -   Change in appetite 1 - - -   Feeling bad or failure about yourself  0 - - -   Trouble concentrating 0 - - -   Moving slowly or fidgety/restless 1 - - -   Suicidal thoughts 0 - - -   PHQ-9 Score 4 - - -   Difficult doing work/chores Somewhat difficult - - -     Interpretation of Total Score  Total Score Depression Severity:  1-4 = Minimal depression, 5-9 = Mild depression, 10-14 = Moderate depression, 15-19 = Moderately severe depression, 20-27 = Severe depression   Psychosocial Evaluation and Intervention: Psychosocial Evaluation - 01/05/18 1034      Psychosocial Evaluation & Interventions   Interventions  Encouraged to exercise with the program and follow exercise prescription;Stress management education;Relaxation education    Comments  Patient's initial QOL score was  20.70 and his PHQ_9 score was 4 with no psychosocial issues identified. He does have difficulty falling asleep since his procedure. His PCP prescribed Lorazepam 0.5 mg as needed for sleep.     Expected Outcomes  Patient will have no psychosocial issues identified at discharge. His QOL and PHQ-9 scores will improve at discharge.     Continue Psychosocial Services   No Follow up required       Psychosocial Re-Evaluation: Psychosocial Re-Evaluation     [place] Name 01/21/18 1516 02/19/18 0741 03/16/18 0751 04/01/18 1356       Psychosocial Re-Evaluation   Current issues with  None Identified  None Identified  None Identified  None Identified    Comments  Patient's initial QOL score was 20.70 and his PHQ-9 score was 4 with no issues psychosocial issues identified.  Patient's initial QOL score was 20.70 and his PHQ-9 score was 4 with no issues psychosocial issues identified.  Patient's initial QOL score was 20.70 and his PHQ-9 score was 4 with no issues psychosocial issues identified.  Patient's initial QOL score was 20.70 and his PHQ-9 score was 4 with no issues psychosocial issues identified.    Expected Outcomes  Patient will have no psychosocial issues identified at discharge.   Patient will have no psychosocial issues identified at discharge.   Patient will have no psychosocial issues identified at discharge.   Patient will have no psychosocial issues identified at discharge.     Interventions  Relaxation education;Stress management education;Encouraged to attend Cardiac Rehabilitation for the exercise  Relaxation education;Stress management education;Encouraged to attend Cardiac Rehabilitation for the exercise  Relaxation education;Stress management education;Encouraged to attend Cardiac Rehabilitation for the exercise  Relaxation education;Stress management education;Encouraged to attend Cardiac Rehabilitation for the exercise    Continue Psychosocial Services   No Follow up required  No Follow up required  No Follow up required  No Follow up required       Psychosocial Discharge (Final Psychosocial Re-Evaluation): Psychosocial Re-Evaluation - 04/01/18 1356      Psychosocial Re-Evaluation   Current issues with  None Identified    Comments  Patient's initial QOL score was 20.70 and his PHQ-9 score was 4 with no issues psychosocial issues identified.    Expected Outcomes  Patient will have no psychosocial issues identified at discharge.      Interventions  Relaxation education;Stress management education;Encouraged to attend Cardiac Rehabilitation for the exercise    Continue Psychosocial Services   No Follow up required       Vocational Rehabilitation: Provide vocational rehab assistance to qualifying candidates.   Vocational Rehab Evaluation & Intervention: Vocational Rehab - 01/05/18 0943      Initial Vocational Rehab Evaluation & Intervention   Assessment shows need for Vocational Rehabilitation  No       Education: Education Goals: Education classes will be provided on a weekly basis, covering required topics. Participant will state understanding/return demonstration of topics presented.  Learning Barriers/Preferences: Learning Barriers/Preferences - 01/05/18 0944      Learning Barriers/Preferences   Learning Barriers  None    Learning Preferences  Written Material;Verbal Instruction;Skilled Demonstration       Education Topics: Hypertension, Hypertension Reduction -Define heart disease and high blood pressure. Discus how high blood pressure affects the body and ways to reduce high blood pressure.   CARDIAC REHAB PHASE II EXERCISE from 04/01/2018 in [place]  Date  02/18/18  Educator  D. Coad  Instruction Review Code  2- Demonstrated Understanding  Exercise and Your Heart -Discuss why it is important to exercise, the FITT principles of exercise, normal and abnormal responses to exercise, and how to exercise safely.   CARDIAC REHAB PHASE II EXERCISE from 04/01/2018 in [place]  Date  02/25/18  Educator  Coad  Instruction Review Code  2- Demonstrated Understanding      Angina -Discuss definition of angina, causes of angina, treatment of angina, and how to decrease risk of having angina.   Cardiac Medications -Review what the following cardiac medications are used for, how they affect the body, and side effects that may occur when taking the  medications.  Medications include Aspirin, Beta blockers, calcium channel blockers, ACE Inhibitors, angiotensin receptor blockers, diuretics, digoxin, and antihyperlipidemics.   CARDIAC REHAB PHASE II EXERCISE from 04/01/2018 in [place]  Date  03/11/18  Educator  Etheleen Mayhew  Instruction Review Code  2- Demonstrated Understanding      Congestive Heart Failure -Discuss the definition of CHF, how to live with CHF, the signs and symptoms of CHF, and how keep track of weight and sodium intake.   CARDIAC REHAB PHASE II EXERCISE from 04/01/2018 in [place]  Date  03/18/18  Educator  Etheleen Mayhew  Instruction Review Code  2- Demonstrated Understanding      Heart Disease and Intimacy -Discus the effect sexual activity has on the heart, how changes occur during intimacy as we age, and safety during sexual activity.   CARDIAC REHAB PHASE II EXERCISE from 04/01/2018 in [place]  Date  03/25/18  Educator  Etheleen Mayhew  Instruction Review Code  2- Demonstrated Understanding      Smoking Cessation / COPD -Discuss different methods to quit smoking, the health benefits of quitting smoking, and the definition of COPD.   CARDIAC REHAB PHASE II EXERCISE from 04/01/2018 in [place]  Date  04/01/18  Educator  Wynetta Emery  Instruction Review Code  2- Demonstrated Understanding      Nutrition I: Fats -Discuss the types of cholesterol, what cholesterol does to the heart, and how cholesterol levels can be controlled.   CARDIAC REHAB PHASE II EXERCISE from 04/01/2018 in [place]  Date  01/07/18  Educator  D. Coad  Instruction Review Code  2- Demonstrated Understanding      Nutrition II: Labels -Discuss the different components of food labels and how to read food label   [place] from 04/01/2018 in [place]  Date  01/14/18  Educator   Etheleen Mayhew  Instruction Review Code  2- Demonstrated Understanding      Heart Parts/Heart Disease and PAD -Discuss the anatomy of the heart, the pathway of blood circulation through the heart, and these are affected by heart disease.   CARDIAC REHAB PHASE II EXERCISE from 04/01/2018 in [place]  Date  01/21/18  Educator  Etheleen Mayhew  Instruction Review Code  2- Demonstrated Understanding      Stress I: Signs and Symptoms -Discuss the causes of stress, how stress may lead to anxiety and depression, and ways to limit stress.   CARDIAC REHAB PHASE II EXERCISE from 04/01/2018 in [place]  Date  01/28/18  Educator  D. Coad  Instruction Review Code  2- Demonstrated Understanding      Stress II: Relaxation -Discuss different types of relaxation techniques to limit stress.   CARDIAC REHAB PHASE II EXERCISE from 04/01/2018 in [place]  [place]  Date  02/04/18  Educator  D. Coad  Instruction Review Code  2- Demonstrated Understanding      Warning Signs of Stroke / TIA -Discuss definition of a stroke, what the signs and symptoms are of a stroke, and how to identify when someone is having stroke.   Knowledge Questionnaire Score: Knowledge Questionnaire Score - 01/05/18 0942      Knowledge Questionnaire Score   Pre Score  25/28       Core Components/Risk Factors/Patient Goals at Admission: Personal Goals and Risk Factors at Admission - 01/05/18 0944      Core Components/Risk Factors/Patient Goals on Admission    Weight Management  Weight Maintenance    Tobacco Cessation  Yes    Intervention  Assist the participant in steps to quit. Provide individualized education and counseling about committing to Tobacco Cessation, relapse prevention, and pharmacological support that can be provided by physician.;Advice worker, assist with locating and accessing local/national Quit Smoking programs, and support  quit date choice.    Expected Outcomes  Short Term: Will demonstrate readiness to quit, by selecting a quit date.    Personal Goal Other  Yes    Personal Goal  Learn more about heart heatlhy eating; increase strength; get strong enough to get back to work. Quit smoking or reduce his smoking.     Intervention  Patient will attend CR 3 days/week and supplement with exercise 2 days/week at home by walking.     Expected Outcomes  Patient will meet his personal goals.        Core Components/Risk Factors/Patient Goals Review:  Goals and Risk Factor Review    Row Name 01/21/18 1511 02/19/18 0734 03/16/18 0746 04/01/18 1353       Core Components/Risk Factors/Patient Goals Review   Personal Goals Review  Tobacco Cessation;Weight Management/Obesity Learn how to eat healthier; increase strength; quit smoking or decrease smoking. Get back to work.   Tobacco Cessation;Weight Management/Obesity Learn how to eat healthier; increase strength; quit smoking or reduce smoking; get back to wrok.   Tobacco Cessation;Weight Management/Obesity Learn how to eat healthier; increase strength; quit smoking ro decrease usuage; get back to work.   Tobacco Cessation;Weight Management/Obesity Learn how to eat healthier; increase strength; quit smoking or decrease usage; get back to work.     Review  Patient has completed 7 sessions losing one lb since his orientation visit. He is doing well in the program with some progression. He did meet with RD and said he learned some tips about eating healthy. He continues to say he is not ready to quit smoking yet and says he is not interested in a smoking cessation class. Will continue to encourage patient to quit smoking.   Patient has completed 15 sessions losing 2 lbs since last 30 day review. He continues to do well in the program with progression. He says he is learning how to eat healthier. He does feel stronger but has not returned back to work. He has not quit smoking and continues  to say he is not interested in a smoking cessation class. He does not cough as much during exercise as when he started the program. Will continue to monitor for progress.   Patient has completed 21 sessions gaining 2 lbs since last 30 day review. He continues to do well in the program with progression. His smoking usage has not changed. He says he is not interested in any smoking cessation classes at this time. Information was  provided on smoking cessation. He says he does feel his strength is increasing. He has not returned to work yet but does feel he is strong enough. Will continue to monitor.   Patient has completed 29 sessions losing 3 lbs since last 30 day review. He continues to do well in the program with progression. He continues to smoke the same amount and continues to say he is not interested in smoking cessation information. He says he is feeing stronger and has more energy. He has not returned to work. He says he has learned more about healthy eating. Will continue to monitor for progress.     Expected Outcomes  Patient will continue to attend sessions and complete the program meeting his personal goals.   Patient will continue to attend sessions and complete the program meeting his personal goals.   Patient will continue to attend sessions and complete the program meeting his personal goals.   Patient will continue to attend sessions and complete the program meeting his personal goals.        Core Components/Risk Factors/Patient Goals at Discharge (Final Review):  Goals and Risk Factor Review - 04/01/18 1353      Core Components/Risk Factors/Patient Goals Review   Personal Goals Review  Tobacco Cessation;Weight Management/Obesity   Learn how to eat healthier; increase strength; quit smoking or decrease usage; get back to work.    Review  Patient has completed 29 sessions losing 3 lbs since last 30 day review. He continues to do well in the program with progression. He continues to smoke the  same amount and continues to say he is not interested in smoking cessation information. He says he is feeing stronger and has more energy. He has not returned to work. He says he has learned more about healthy eating. Will continue to monitor for progress.     Expected Outcomes  Patient will continue to attend sessions and complete the program meeting his personal goals.        ITP Comments: ITP Comments    Row Name 01/05/18 5486 04/23/18 0942         ITP Comments  Patient is a 68 yr old male referred by Dr. 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 He is currenlty smoking 3/4 ppd. He is interested iin quiting but not actively trying to quit. He has no barriers to CR. He has COPD treated with inhalers.   Patient only attended one session since last 30 day review. He is currently being evaluated for liver mass with biospy. Will continue to monitor.          Comments: ITP REVIEW Patient only attended one session since last 30 day review. He is currently being evaluated for liver mass with biospy. Will continue to monitor.

## 2018-04-24 ENCOUNTER — Telehealth (INDEPENDENT_AMBULATORY_CARE_PROVIDER_SITE_OTHER): Payer: Self-pay | Admitting: Internal Medicine

## 2018-04-24 NOTE — Telephone Encounter (Signed)
   Primary Cardiologist:Branch, Roderic Palau, MD  Chart reviewed as part of pre-operative protocol coverage. Pre-op clearance already addressed by colleagues in earlier notes as below:  Primary Cardiologist: Carlyle Dolly, MD  Chart reviewed as part of pre-operative protocol coverage. Given past medical history and time since last visit, based on ACC/AHA guidelines, Adam Bass would be at acceptable risk for the planned procedure without further cardiovascular testing.   OK to hold Brilinta for 5 days prior to biopsy. Synergy stent was used so Brilinta can be held sooner than normal, compared to other DES. Would restart Brilinta when safe from a bleeding standpoint.   I will route this recommendation to the requesting party via Epic fax function and remove from pre-op pool.  Please call with questions.  Kerin Ransom, PA-C 04/17/2018, 2:21 PM   We were asked to update the clearance to reflect that the procedure is a thermal ablation, not a biopsy. This has been noted and does not appear to change the ability to hold the Brilinta at this time.  I do not see a fax number in the original request so will route to callback staff to help facilitate faxing.  Charlie Pitter, PA-C 04/24/2018, 12:20 PM

## 2018-04-24 NOTE — Telephone Encounter (Signed)
Will address in original phone note. Sway Guttierrez PA-C

## 2018-04-24 NOTE — Telephone Encounter (Signed)
[name], CBC, Hepatic and Hep C quaint in 8 weeks.  [name], OV in 8 weeks.

## 2018-04-27 ENCOUNTER — Other Ambulatory Visit (INDEPENDENT_AMBULATORY_CARE_PROVIDER_SITE_OTHER): Payer: Self-pay | Admitting: *Deleted

## 2018-04-27 DIAGNOSIS — B1710 Acute hepatitis C without hepatic coma: Secondary | ICD-10-CM

## 2018-04-27 NOTE — Telephone Encounter (Signed)
CBC, Hepatic and Hep C Quant noted for 8 weeks and the patient will be sent a reminder.

## 2018-04-28 NOTE — Addendum Note (Signed)
Encounter addended by: Ferrel Logan on: 04/28/2018 3:48 PM  Actions taken: Flowsheet accepted, Visit Navigator Flowsheet section accepted

## 2018-04-28 NOTE — Telephone Encounter (Signed)
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 Please advise.

## 2018-04-29 ENCOUNTER — Other Ambulatory Visit (HOSPITAL_COMMUNITY): Payer: Self-pay | Admitting: Interventional Radiology

## 2018-04-29 ENCOUNTER — Telehealth: Payer: Self-pay | Admitting: *Deleted

## 2018-04-29 DIAGNOSIS — C220 Liver cell carcinoma: Secondary | ICD-10-CM

## 2018-04-29 NOTE — Telephone Encounter (Signed)
Patient is already cleared as summarized below. Covering staff, Please fax this manually.   Leanor Kail, PAC

## 2018-04-29 NOTE — Telephone Encounter (Signed)
CONTACTED [place] [facility] [phone]

## 2018-05-07 NOTE — Progress Notes (Signed)
Cardiac Individual Treatment Plan  Patient Details  Name: Adam Bass MRN: [id number] Date of Birth: 09-03-1950 Referring Provider:     CARDIAC REHAB PHASE II ORIENTATION from 01/05/2018 in [place]  Referring Provider  Adam Bass      Initial Encounter Date:    CARDIAC REHAB PHASE II ORIENTATION from 01/05/2018 in [facility]  Date  01/05/18      Visit Diagnosis: ST elevation myocardial infarction (STEMI) involving other coronary artery of inferior wall (HCC)  Status post coronary artery stent placement  Patient's Home Medications on Admission:  Current Outpatient Medications:  .  albuterol (PROAIR HFA) 108 (90 Base) MCG/ACT inhaler, Inhale 2 puffs into the lungs every 6 (six) hours as needed for wheezing. , Disp: , Rfl:  .  aspirin EC 81 MG tablet, Take 81 mg by mouth daily., Disp: , Rfl:  .  bisoprolol (ZEBETA) 5 MG tablet, Take 0.5 tablets (2.5 mg total) by mouth daily., Disp: 60 tablet, Rfl: 1 .  Blood Pressure KIT, 1 Units by Does not apply route daily., Disp: 1 each, Rfl: 0 .  ezetimibe (ZETIA) 10 MG tablet, Take 1 tablet (10 mg total) by mouth daily., Disp: 90 tablet, Rfl: 1 .  fluticasone (FLONASE) 50 MCG/ACT nasal spray, Place 1 spray into both nostrils daily., Disp: , Rfl:  .  Glecaprevir-Pibrentasvir (MAVYRET) 100-40 MG TABS, Take by mouth 3 (three) times daily., Disp: , Rfl:  .  lisinopril (PRINIVIL,ZESTRIL) 2.5 MG tablet, Take 1 tablet (2.5 mg total) by mouth daily., Disp: 90 tablet, Rfl: 3 .  LORazepam (ATIVAN) 0.5 MG tablet, Take 1 tablet (0.5 mg total) by mouth every 6 (six) hours as needed for anxiety., Disp: 25 tablet, Rfl: 0 .  Multiple Vitamins-Minerals (MULTIVITAMIN WITH MINERALS) tablet, Take 1 tablet by mouth daily., Disp: , Rfl:  .  nitroGLYCERIN (NITROSTAT) 0.4 MG SL tablet, Place 1 tablet (0.4 mg total) under the tongue every 5 (five) minutes as needed., Disp: 25 tablet, Rfl: 3 .  pantoprazole (PROTONIX) 40  MG tablet, TK 1 T PO QD, Disp: , Rfl: 5 .  Potassium 99 MG TABS, Take 1 tablet by mouth daily. , Disp: , Rfl:  .  tamsulosin (FLOMAX) 0.4 MG CAPS capsule, Take 0.4 mg by mouth daily., Disp: , Rfl:  .  Tetrahydrozoline HCl (VISINE OP), Place 1 drop into both eyes daily as needed (Dry Eyes)., Disp: , Rfl:  .  ticagrelor (BRILINTA) 90 MG TABS tablet, Take 1 tablet (90 mg total) by mouth 2 (two) times daily., Disp: 180 tablet, Rfl: 2 .  TRELEGY ELLIPTA 100-62.5-25 MCG/INH AEPB, INHALE 1 PUFF INTO THE LUNGS EVERY DAY, Disp: 60 each, Rfl: 11 .  vitamin E 400 UNIT capsule, Take 1 tablet by mouth daily. , Disp: , Rfl:   Past Medical History: Past Medical History:  Diagnosis Date  . COPD (chronic obstructive pulmonary disease) ([place])    for 10 years or more   . History of radiation therapy 06/03/17- 06/09/17   Left Lung treated to 54 Gy with 3 fx of 18 Gy. SBRT  . Lung cancer ([place])   . Myocardial infarction ([place])   . Skin cancer of face 07/2015   skin cancer removed from face by Dr. Tarri Glenn in [place]    Tobacco Use: Social History   Tobacco Use  Smoking Status Current Every Day Smoker  . Packs/day: 0.75  . Years: 50.00  . Pack years: 37.50  . Types: Cigars  Smokeless Tobacco Never  Used  Tobacco Comment   he is wearing a patch. He is smoking about 5-10 cigarettes daily.     Labs: Recent Review Flowsheet Data    Labs for ITP Cardiac and Pulmonary Rehab Latest Ref Rng & Units 05/06/2017 11/23/2017 11/24/2017   Cholestrol 0 - 200 mg/dL - 146 140   LDLCALC 0 - 99 mg/dL - 100(H) 84   HDL >40 mg/dL - 35(L) 33(L)   Trlycerides <150 mg/dL - 53 116   Hemoglobin A1c 4.8 - 5.6 % - - 5.0   PHART 7.350 - 7.450 7.444 - -   PCO2ART 32.0 - 48.0 mmHg 38.9 - -   HCO3 20.0 - 28.0 mmol/L 26.7 - -   TCO2 22 - 32 mmol/L - 23 -   O2SAT % 95.9 - -      Capillary Blood Glucose: Lab Results  Component Value Date   GLUCAP 104 (H) 04/02/2017   GLUCAP 112 (H) 10/02/2016     Exercise Target  Goals: Exercise Program Goal: Individual exercise prescription set using results from initial 6 min walk test and THRR while considering  patient's activity barriers and safety.   Exercise Prescription Goal: Starting with aerobic activity 30 plus minutes a day, 3 days per week for initial exercise prescription. Provide home exercise prescription and guidelines that participant acknowledges understanding prior to discharge.  Activity Barriers & Risk Stratification: Activity Barriers & Cardiac Risk Stratification - 01/05/18 1029      Activity Barriers & Cardiac Risk Stratification   Activity Barriers  Joint Problems;Deconditioning;Muscular Weakness;Shortness of Breath    Cardiac Risk Stratification  High       6 Minute Walk: 6 Minute Walk    Row Name 01/05/18 1028         6 Minute Walk   Phase  Initial     Distance  1000 feet     Walk Time  6 minutes     # of Rest Breaks  0     MPH  1.89     METS  2.45     RPE  13     Perceived Dyspnea   13     VO2 Peak  10.25     Symptoms  Yes (comment)     Comments  4/10 bilateral hip pain      Resting HR  64 bpm     Resting BP  110/60     Resting Oxygen Saturation   95 %     Exercise Oxygen Saturation  during 6 min walk  92 %     Max Ex. HR  91 bpm     Max Ex. BP  138/68     2 Minute Post BP  114/66        Oxygen Initial Assessment:   Oxygen Re-Evaluation:   Oxygen Discharge (Final Oxygen Re-Evaluation):   Initial Exercise Prescription: Initial Exercise Prescription - 01/05/18 1000      Date of Initial Exercise RX and Referring Provider   Date  01/05/18    Referring Provider  [facility]    Expected Discharge Date  04/06/18      NuStep   Level  1    SPM  55    Minutes  17    METs  1.9      Arm Ergometer   Level  1.5    Watts  11    RPM  41    Minutes  17    METs  1.7  Prescription Details   Frequency (times per week)  3    Duration  Progress to 30 minutes of continuous aerobic without signs/symptoms of  physical distress      Intensity   THRR 40-80% of Max Heartrate  [phone]    Ratings of Perceived Exertion  11-13    Perceived Dyspnea  0-4      Progression   Progression  Continue progressive overload as per policy without signs/symptoms or physical distress.      Resistance Training   Training Prescription  Yes    Weight  1    Reps  10-15       Perform Capillary Blood Glucose checks as needed.  Exercise Prescription Changes:  Exercise Prescription Changes    Row Name 01/05/18 1000 02/03/18 1100 03/05/18 1200 03/18/18 1400 03/31/18 1500     Response to Exercise   Blood Pressure (Admit)  -  112/58  110/60  118/60  110/62   Blood Pressure (Exercise)  -  132/60  138/70  150/72  140/62   Blood Pressure (Exit)  -  116/60  118/60  114/62  104/60   Heart Rate (Admit)  -  59 bpm  64 bpm  62 bpm  61 bpm   Heart Rate (Exercise)  -  72 bpm  105 bpm  70 bpm  80 bpm   Heart Rate (Exit)  -  67 bpm  70 bpm  70 bpm  69 bpm   Rating of Perceived Exertion (Exercise)  -  11  11  11  11    Comments  -  -  -  increase in overall MET level   -   Duration  -  Progress to 30 minutes of  aerobic without signs/symptoms of physical distress  Progress to 30 minutes of  aerobic without signs/symptoms of physical distress  Progress to 30 minutes of  aerobic without signs/symptoms of physical distress  Progress to 30 minutes of  aerobic without signs/symptoms of physical distress   Intensity  -  THRR New 99-117-135  THRR unchanged  THRR unchanged  THRR unchanged     Progression   Progression  -  Continue to progress workloads to maintain intensity without signs/symptoms of physical distress.  Continue to progress workloads to maintain intensity without signs/symptoms of physical distress.  Continue to progress workloads to maintain intensity without signs/symptoms of physical distress.  Continue to progress workloads to maintain intensity without signs/symptoms of physical distress.   Average METs  -  1.9   2.15  2.4  2.3     Resistance Training   Training Prescription  -  Yes  Yes  Yes  Yes   Weight  -  2  3  3  3    Reps  -  10-15  10-15  10-15  10-15     NuStep   Level  -  2  2  2  2    SPM  -  66  76  69  73   Minutes  -  22  22  22  22    METs  -  1.7  1.8  1.7  1.9     Arm Ergometer   Level  -  2  2.5  3  3    Watts  -  11  16  22  17    RPM  -  44  50  54  50   Minutes  -  17  17  17   17  METs  -  2.1  2.5  3.1  2.7     Home Exercise Plan   Plans to continue exercise at  Home (comment) walking   Home (comment)  Home (comment)  Home (comment)  Home (comment)   Frequency  Add 2 additional days to program exercise sessions.  Add 2 additional days to program exercise sessions.  Add 2 additional days to program exercise sessions.  Add 2 additional days to program exercise sessions.  Add 2 additional days to program exercise sessions.   Initial Home Exercises Provided  01/05/18  01/05/18  01/05/18  01/05/18  01/05/18   Row Name 04/16/18 0700 04/28/18 1500           Response to Exercise   Blood Pressure (Admit)  100/60  -      Blood Pressure (Exercise)  142/72  -      Blood Pressure (Exit)  108/58  -      Heart Rate (Admit)  74 bpm  -      Heart Rate (Exercise)  76 bpm  -      Heart Rate (Exit)  69 bpm  -      Rating of Perceived Exertion (Exercise)  11  -      Comments  has been out since 04/06/18  - Has been out of exercise since 04/06/2018      Duration  Continue with 30 min of aerobic exercise without signs/symptoms of physical distress.  -      Intensity  THRR unchanged  -        Progression   Progression  Continue to progress workloads to maintain intensity without signs/symptoms of physical distress.  -      Average METs  2.15  -        Resistance Training   Training Prescription  Yes  -      Weight  3  -      Reps  10-15  -        NuStep   Level  3  -      SPM  68  -      Minutes  22  -      METs  1.8  -        Arm Ergometer   Level  3  -      Watts  17  -       RPM  48  -      Minutes  17  -      METs  2.5  -        Home Exercise Plan   Plans to continue exercise at  Home (comment)  -      Frequency  Add 2 additional days to program exercise sessions.  -      Initial Home Exercises Provided  01/05/18  -         Exercise Comments:  Exercise Comments    Row Name 01/19/18 1518 02/18/18 1430 03/09/18 1527 03/31/18 1528 04/21/18 1454   Exercise Comments  Patient has done well in the program so far. He wants to get strong enough to return to work.   Patient feels he has gotten stronger. He is still hoping this helps him make it back to work.   Patient feels stronger but has been out some due to illness. He is having a lot of trouble breathing. We will monitor progress.   Pt has improved his breathing the last  2 weeks. He has tolerated the exercise well so we will progress him as necessary.   Pt. has been out of rehab, sick, since 04/06/2018. Upon his return we will monitor his progress and adjust his workloads as necessary.    [place] Name 04/28/18 1547           Exercise Comments  Pt. has still not returned to [facility].           Exercise Goals and Review:  Exercise Goals    Row Name 01/05/18 1041             Exercise Goals   Increase Physical Activity  Yes       Intervention  Provide advice, education, support and counseling about physical activity/exercise needs.;Develop an individualized exercise prescription for aerobic and resistive training based on initial evaluation findings, risk stratification, comorbidities and participant's personal goals.       Expected Outcomes  Short Term: Attend rehab on a regular basis to increase amount of physical activity.       Increase Strength and Stamina  Yes       Intervention  Provide advice, education, support and counseling about physical activity/exercise needs.;Develop an individualized exercise prescription for aerobic and resistive training based on initial evaluation findings, risk  stratification, comorbidities and participant's personal goals.       Expected Outcomes  Short Term: Increase workloads from initial exercise prescription for resistance, speed, and METs.;Short Term: Perform resistance training exercises routinely during rehab and add in resistance training at home;Long Term: Improve cardiorespiratory fitness, muscular endurance and strength as measured by increased METs and functional capacity (6MWT)       Able to understand and use rate of perceived exertion (RPE) scale  Yes       Intervention  Provide education and explanation on how to use RPE scale       Expected Outcomes  Short Term: Able to use RPE daily in rehab to express subjective intensity level;Long Term:  Able to use RPE to guide intensity level when exercising independently       Able to understand and use Dyspnea scale  Yes       Intervention  Provide education and explanation on how to use Dyspnea scale       Expected Outcomes  Short Term: Able to use Dyspnea scale daily in rehab to express subjective sense of shortness of breath during exertion;Long Term: Able to use Dyspnea scale to guide intensity level when exercising independently       Knowledge and understanding of Target Heart Rate Range (THRR)  Yes       Intervention  Provide education and explanation of THRR including how the numbers were predicted and where they are located for reference       Expected Outcomes  Short Term: Able to state/look up THRR;Long Term: Able to use THRR to govern intensity when exercising independently;Short Term: Able to use daily as guideline for intensity in rehab       Able to check pulse independently  Yes       Intervention  Provide education and demonstration on how to check pulse in carotid and radial arteries.;Review the importance of being able to check your own pulse for safety during independent exercise       Expected Outcomes  Short Term: Able to explain why pulse checking is important during independent  exercise;Long Term: Able to check pulse independently and accurately       Understanding of Exercise  Prescription  Yes       Intervention  Provide education, explanation, and written materials on patient's individual exercise prescription       Expected Outcomes  Short Term: Able to explain program exercise prescription;Long Term: Able to explain home exercise prescription to exercise independently          Exercise Goals Re-Evaluation : Exercise Goals Re-Evaluation    Row Name 01/19/18 1516 02/18/18 1428 03/09/18 1525 03/31/18 1527 04/21/18 1451     Exercise Goal Re-Evaluation   Exercise Goals Review  Increase Physical Activity;Increase Strength and Stamina;Able to understand and use rate of perceived exertion (RPE) scale;Knowledge and understanding of Target Heart Rate Range (THRR);Understanding of Exercise Prescription;Able to understand and use Dyspnea scale;Able to check pulse independently  Increase Physical Activity;Increase Strength and Stamina;Able to understand and use rate of perceived exertion (RPE) scale;Knowledge and understanding of Target Heart Rate Range (THRR);Understanding of Exercise Prescription;Able to understand and use Dyspnea scale;Able to check pulse independently  Increase Physical Activity;Increase Strength and Stamina;Able to understand and use rate of perceived exertion (RPE) scale;Knowledge and understanding of Target Heart Rate Range (THRR);Understanding of Exercise Prescription;Able to understand and use Dyspnea scale;Able to check pulse independently  Increase Physical Activity;Able to understand and use rate of perceived exertion (RPE) scale;Increase Strength and Stamina;Knowledge and understanding of Target Heart Rate Range (THRR);Able to check pulse independently;Understanding of Exercise Prescription  Increase Physical Activity;Able to understand and use rate of perceived exertion (RPE) scale;Increase Strength and Stamina;Knowledge and understanding of Target Heart  Rate Range (THRR);Able to check pulse independently;Understanding of Exercise Prescription   Comments  Patient is new to the program. He has completed 6 sessions. He has tolerated exercise well. We will continue to monitor him and progress as tolerated.   Patient has attended 15 sessions now. He reports feeling stronger and feels this is helping him to be able to do more around the house. He is progressing right on track.   Patient does well in the program when he is feeling well. He has been under the weather the last week or so. He tolerates exercise well when he is able to come.   Pt. has been attending rehab more regularly and feeling better. He works as hard as he can most days but is rather weak which sets him back some. He always pushes himself though to increase his distance on the machines.   Pt. has not been in rehab since 04/06/2018 due to sickness. When he returns we will continue to monitor him and progress as tolerated.    Expected Outcomes  Increase strength, learn to eat healthier.   Increase strength, continue to make healthier eating choices.   Increase strength, continue to make healthier eating choices and decerase smoking.   Increase strength, continue to make healthier eating choices and decerase smoking.   Increase strength, continue to make healthier eating choices and decerase smoking.        Discharge Exercise Prescription (Final Exercise Prescription Changes): Exercise Prescription Changes - 04/28/18 1500      Response to Exercise   Comments  --   Has been out of exercise since 04/06/2018      Nutrition:  Target Goals: Understanding of nutrition guidelines, daily intake of sodium <154m, cholesterol <2048m calories 30% from fat and 7% or less from saturated fats, daily to have 5 or more servings of fruits and vegetables.  Biometrics: Pre Biometrics - 01/05/18 1041      Pre Biometrics   Height  5'  9" (1.753 m)    Weight  64.9 kg    Waist Circumference  30.25  inches    Hip Circumference  32.5 inches    Waist to Hip Ratio  0.93 %    BMI (Calculated)  21.12    Triceps Skinfold  3 mm    % Body Fat  14.2 %    Grip Strength  19.4 kg    Flexibility  16.3 in    Single Leg Stand  5 seconds        Nutrition Therapy Plan and Nutrition Goals: Nutrition Therapy & Goals - 01/15/18 1553      Personal Nutrition Goals   Nutrition Goal  For heart healthy choices add >50% of whole grains, make half their plate fruits and vegetables. Discuss the difference between starchy vegetables and leafy greens, and how leafy vegetables provide fiber, helps maintain healthy weight, helps control blood glucose, and lowers cholesterol.  Discuss purchasing fresh or frozen vegetable to reduce sodium and not to add grease, fat or sugar. Consume <18oz of red meat per week. Consume lean cuts of meats and very little of meats high in sodium and nitrates such as pork and lunch meats. Discussed portion control for all food groups.      Comments  Patient met with RD 01/15/18.      Intervention Plan   Intervention  Nutrition handout(s) given to patient.    Expected Outcomes  Short Term Goal: Understand basic principles of dietary content, such as calories, fat, sodium, cholesterol and nutrients.       Nutrition Assessments: Nutrition Assessments - 01/05/18 0942      MEDFICTS Scores   Pre Score  50       Nutrition Goals Re-Evaluation: Nutrition Goals Re-Evaluation    Row Name 01/21/18 1510 02/19/18 0734 03/16/18 0746 04/01/18 1352       Goals   Current Weight  142 lb (64.4 kg)  139 lb 14.4 oz (63.5 kg)  141 lb (64 kg)  139 lb 9.6 oz (63.3 kg)    Nutrition Goal  For heart healthy choices add >50% of whole grains, make half their plate fruits and vegetables. Discuss the difference between starchy vegetables and leafy greens, and how leafy vegetables provide fiber, helps maintain healthy weight, helps control blood glucose, and lowers cholesterol.  Discuss purchasing fresh or  frozen vegetable to reduce sodium and not to add grease, fat or sugar. Consume <18oz of red meat per week. Consume lean cuts of meats and very little of meats high in sodium and nitrates such as pork and lunch meats. Discussed portion control for all food groups.    For heart healthy choices add >50% of whole grains, make half their plate fruits and vegetables. Discuss the difference between starchy vegetables and leafy greens, and how leafy vegetables provide fiber, helps maintain healthy weight, helps control blood glucose, and lowers cholesterol.  Discuss purchasing fresh or frozen vegetable to reduce sodium and not to add grease, fat or sugar. Consume <18oz of red meat per week. Consume lean cuts of meats and very little of meats high in sodium and nitrates such as pork and lunch meats. Discussed portion control for all food groups.    For heart healthy choices add >50% of whole grains, make half their plate fruits and vegetables. Discuss the difference between starchy vegetables and leafy greens, and how leafy vegetables provide fiber, helps maintain healthy weight, helps control blood glucose, and lowers cholesterol.  Discuss purchasing fresh or  frozen vegetable to reduce sodium and not to add grease, fat or sugar. Consume <18oz of red meat per week. Consume lean cuts of meats and very little of meats high in sodium and nitrates such as pork and lunch meats. Discussed portion control for all food groups.    For heart healthy choices add >50% of whole grains, make half their plate fruits and vegetables. Discuss the difference between starchy vegetables and leafy greens, and how leafy vegetables provide fiber, helps maintain healthy weight, helps control blood glucose, and lowers cholesterol.  Discuss purchasing fresh or frozen vegetable to reduce sodium and not to add grease, fat or sugar. Consume <18oz of red meat per week. Consume lean cuts of meats and very little of meats high in sodium and nitrates such as  pork and lunch meats. Discussed portion control for all food groups.      Comment  Patient has lost 1 lb since his orientation visit. He says he is trying to eat heart healthy. Will continue to monitor for progress.   Patient has lost 2 lbs since last 30 day review. He continues to say he is trying to eat heart healthy.   Patient has gained 2 lbs since last 30 day review. He continues to say he is trying to eat heart healthy.   Patient has lost 2.4 lbs since last 30 day review. He says he is trying to eat healthy. Will continue to monitor for progress.     Expected Outcome  Patient will continue to work toward meeting his nutritional goals.   Patient will continue to work toward meeting his nutritional goals.   Patient will continue to work toward meeting his nutritional goals.   Patient will continue to work toward meeting his nutritional goals.        Nutrition Goals Discharge (Final Nutrition Goals Re-Evaluation): Nutrition Goals Re-Evaluation - 04/01/18 1352      Goals   Current Weight  139 lb 9.6 oz (63.3 kg)    Nutrition Goal  For heart healthy choices add >50% of whole grains, make half their plate fruits and vegetables. Discuss the difference between starchy vegetables and leafy greens, and how leafy vegetables provide fiber, helps maintain healthy weight, helps control blood glucose, and lowers cholesterol.  Discuss purchasing fresh or frozen vegetable to reduce sodium and not to add grease, fat or sugar. Consume <18oz of red meat per week. Consume lean cuts of meats and very little of meats high in sodium and nitrates such as pork and lunch meats. Discussed portion control for all food groups.      Comment  Patient has lost 2.4 lbs since last 30 day review. He says he is trying to eat healthy. Will continue to monitor for progress.     Expected Outcome  Patient will continue to work toward meeting his nutritional goals.        Psychosocial: Target Goals: Acknowledge presence or absence of  significant depression and/or stress, maximize coping skills, provide positive support system. Participant is able to verbalize types and ability to use techniques and skills needed for reducing stress and depression.  Initial Review & Psychosocial Screening: Initial Psych Review & Screening - 01/05/18 1032      Initial Review   Current issues with  None Identified      Family Dynamics   Good Support System?  Yes    Comments  Patient has good family support from his daughters and his ex-wife and from friends.  Barriers   Psychosocial barriers to participate in program  There are no identifiable barriers or psychosocial needs.      Screening Interventions   Interventions  Encouraged to exercise;Provide feedback about the scores to participant    Expected Outcomes  Long Term goal: The participant improves quality of Life and PHQ9 Scores as seen by post scores and/or verbalization of changes       Quality of Life Scores: Quality of Life - 01/05/18 1043      Quality of Life   Select  Quality of Life      Quality of Life Scores   Health/Function Pre  15.53 %    Socioeconomic Pre  24 %    Psych/Spiritual Pre  24.86 %    Family Pre  27 %    GLOBAL Pre  20.7 %      Scores of 19 and below usually indicate a poorer quality of life in these areas.  A difference of  2-3 points is a clinically meaningful difference.  A difference of 2-3 points in the total score of the Quality of Life Index has been associated with significant improvement in overall quality of life, self-image, physical symptoms, and general health in studies assessing change in quality of life.  PHQ-9: Recent Review Flowsheet Data    Depression screen [facility] 2/9 01/05/2018 09/12/2017 07/04/2017 04/30/2017   Decreased Interest 0 0 0 0   Down, Depressed, Hopeless 0 0 0 0   PHQ - 2 Score 0 0 0 0   Altered sleeping 1 - - -   Tired, decreased energy 1 - - -   Change in appetite 1 - - -   Feeling bad or failure about  yourself  0 - - -   Trouble concentrating 0 - - -   Moving slowly or fidgety/restless 1 - - -   Suicidal thoughts 0 - - -   PHQ-9 Score 4 - - -   Difficult doing work/chores Somewhat difficult - - -     Interpretation of Total Score  Total Score Depression Severity:  1-4 = Minimal depression, 5-9 = Mild depression, 10-14 = Moderate depression, 15-19 = Moderately severe depression, 20-27 = Severe depression   Psychosocial Evaluation and Intervention: Psychosocial Evaluation - 01/05/18 1034      Psychosocial Evaluation & Interventions   Interventions  Encouraged to exercise with the program and follow exercise prescription;Stress management education;Relaxation education    Comments  Patient's initial QOL score was 20.70 and his PHQ_9 score was 4 with no psychosocial issues identified. He does have difficulty falling asleep since his procedure. His PCP prescribed Lorazepam 0.5 mg as needed for sleep.     Expected Outcomes  Patient will have no psychosocial issues identified at discharge. His QOL and PHQ-9 scores will improve at discharge.     Continue Psychosocial Services   No Follow up required       Psychosocial Re-Evaluation: Psychosocial Re-Evaluation    [place] Name 01/21/18 1516 02/19/18 0741 03/16/18 0751 04/01/18 1356       Psychosocial Re-Evaluation   Current issues with  None Identified  None Identified  None Identified  None Identified    Comments  Patient's initial QOL score was 20.70 and his PHQ-9 score was 4 with no issues psychosocial issues identified.  Patient's initial QOL score was 20.70 and his PHQ-9 score was 4 with no issues psychosocial issues identified.  Patient's initial QOL score was 20.70 and his PHQ-9 score was 4 with  no issues psychosocial issues identified.  Patient's initial QOL score was 20.70 and his PHQ-9 score was 4 with no issues psychosocial issues identified.    Expected Outcomes  Patient will have no psychosocial issues identified at discharge.    Patient will have no psychosocial issues identified at discharge.   Patient will have no psychosocial issues identified at discharge.   Patient will have no psychosocial issues identified at discharge.     Interventions  Relaxation education;Stress management education;Encouraged to attend Cardiac Rehabilitation for the exercise  Relaxation education;Stress management education;Encouraged to attend Cardiac Rehabilitation for the exercise  Relaxation education;Stress management education;Encouraged to attend Cardiac Rehabilitation for the exercise  Relaxation education;Stress management education;Encouraged to attend Cardiac Rehabilitation for the exercise    Continue Psychosocial Services   No Follow up required  No Follow up required  No Follow up required  No Follow up required       Psychosocial Discharge (Final Psychosocial Re-Evaluation): Psychosocial Re-Evaluation - 04/01/18 1356      Psychosocial Re-Evaluation   Current issues with  None Identified    Comments  Patient's initial QOL score was 20.70 and his PHQ-9 score was 4 with no issues psychosocial issues identified.    Expected Outcomes  Patient will have no psychosocial issues identified at discharge.     Interventions  Relaxation education;Stress management education;Encouraged to attend Cardiac Rehabilitation for the exercise    Continue Psychosocial Services   No Follow up required       Vocational Rehabilitation: Provide vocational rehab assistance to qualifying candidates.   Vocational Rehab Evaluation & Intervention: Vocational Rehab - 01/05/18 0943      Initial Vocational Rehab Evaluation & Intervention   Assessment shows need for Vocational Rehabilitation  No       Education: Education Goals: Education classes will be provided on a weekly basis, covering required topics. Participant will state understanding/return demonstration of topics presented.  Learning Barriers/Preferences: Learning Barriers/Preferences -  01/05/18 0944      Learning Barriers/Preferences   Learning Barriers  None    Learning Preferences  Written Material;Verbal Instruction;Skilled Demonstration       Education Topics: Hypertension, Hypertension Reduction -Define heart disease and high blood pressure. Discus how high blood pressure affects the body and ways to reduce high blood pressure.   CARDIAC REHAB PHASE II EXERCISE from 04/01/2018 in [place]  Date  02/18/18  Educator  D. Coad  Instruction Review Code  2- Demonstrated Understanding      Exercise and Your Heart -Discuss why it is important to exercise, the FITT principles of exercise, normal and abnormal responses to exercise, and how to exercise safely.   CARDIAC REHAB PHASE II EXERCISE from 04/01/2018 in [place]  Date  02/25/18  Educator  Coad  Instruction Review Code  2- Demonstrated Understanding      Angina -Discuss definition of angina, causes of angina, treatment of angina, and how to decrease risk of having angina.   Cardiac Medications -Review what the following cardiac medications are used for, how they affect the body, and side effects that may occur when taking the medications.  Medications include Aspirin, Beta blockers, calcium channel blockers, ACE Inhibitors, angiotensin receptor blockers, diuretics, digoxin, and antihyperlipidemics.   CARDIAC REHAB PHASE II EXERCISE from 04/01/2018 in [place]  Date  03/11/18  Educator  Etheleen Mayhew  Instruction Review Code  2- Demonstrated Understanding      Congestive Heart Failure -Discuss the definition of CHF,  how to live with CHF, the signs and symptoms of CHF, and how keep track of weight and sodium intake.   CARDIAC REHAB PHASE II EXERCISE from 04/01/2018 in [place]  Date  03/18/18  Educator  Etheleen Mayhew  Instruction Review Code  2- Demonstrated Understanding      Heart Disease and  Intimacy -Discus the effect sexual activity has on the heart, how changes occur during intimacy as we age, and safety during sexual activity.   CARDIAC REHAB PHASE II EXERCISE from 04/01/2018 in [place]  Date  03/25/18  Educator  Etheleen Mayhew  Instruction Review Code  2- Demonstrated Understanding      Smoking Cessation / COPD -Discuss different methods to quit smoking, the health benefits of quitting smoking, and the definition of COPD.   CARDIAC REHAB PHASE II EXERCISE from 04/01/2018 in [place]  Date  04/01/18  Educator  Wynetta Emery  Instruction Review Code  2- Demonstrated Understanding      Nutrition I: Fats -Discuss the types of cholesterol, what cholesterol does to the heart, and how cholesterol levels can be controlled.   CARDIAC REHAB PHASE II EXERCISE from 04/01/2018 in [place]  Date  01/07/18  Educator  D. Coad  Instruction Review Code  2- Demonstrated Understanding      Nutrition II: Labels -Discuss the different components of food labels and how to read food label   [place] from 04/01/2018 in [place]  Date  01/14/18  Educator  Etheleen Mayhew  Instruction Review Code  2- Demonstrated Understanding      Heart Parts/Heart Disease and PAD -Discuss the anatomy of the heart, the pathway of blood circulation through the heart, and these are affected by heart disease.   CARDIAC REHAB PHASE II EXERCISE from 04/01/2018 in [place]  Date  01/21/18  Educator  Etheleen Mayhew  Instruction Review Code  2- Demonstrated Understanding      Stress I: Signs and Symptoms -Discuss the causes of stress, how stress may lead to anxiety and depression, and ways to limit stress.   CARDIAC REHAB PHASE II EXERCISE from 04/01/2018 in [place]  Date  01/28/18  Educator  D. Coad  Instruction Review Code  2- Demonstrated  Understanding      Stress II: Relaxation -Discuss different types of relaxation techniques to limit stress.   CARDIAC REHAB PHASE II EXERCISE from 04/01/2018 in [place]  Date  02/04/18  Educator  D. Coad  Instruction Review Code  2- Demonstrated Understanding      Warning Signs of Stroke / TIA -Discuss definition of a stroke, what the signs and symptoms are of a stroke, and how to identify when someone is having stroke.   Knowledge Questionnaire Score: Knowledge Questionnaire Score - 01/05/18 0942      Knowledge Questionnaire Score   Pre Score  25/28       Core Components/Risk Factors/Patient Goals at Admission: Personal Goals and Risk Factors at Admission - 01/05/18 0944      Core Components/Risk Factors/Patient Goals on Admission    Weight Management  Weight Maintenance    Tobacco Cessation  Yes    Intervention  Assist the participant in steps to quit. Provide individualized education and counseling about committing to Tobacco Cessation, relapse prevention, and pharmacological support that can be provided by physician.;Advice worker, assist with locating and accessing local/national Quit Smoking programs,  and support quit date choice.    Expected Outcomes  Short Term: Will demonstrate readiness to quit, by selecting a quit date.    Personal Goal Other  Yes    Personal Goal  Learn more about heart heatlhy eating; increase strength; get strong enough to get back to work. Quit smoking or reduce his smoking.     Intervention  Patient will attend CR 3 days/week and supplement with exercise 2 days/week at home by walking.     Expected Outcomes  Patient will meet his personal goals.        Core Components/Risk Factors/Patient Goals Review:  Goals and Risk Factor Review    Row Name 01/21/18 1511 02/19/18 0734 03/16/18 0746 04/01/18 1353       Core Components/Risk Factors/Patient Goals Review   Personal Goals Review  Tobacco  Cessation;Weight Management/Obesity Learn how to eat healthier; increase strength; quit smoking or decrease smoking. Get back to work.   Tobacco Cessation;Weight Management/Obesity Learn how to eat healthier; increase strength; quit smoking or reduce smoking; get back to wrok.   Tobacco Cessation;Weight Management/Obesity Learn how to eat healthier; increase strength; quit smoking ro decrease usuage; get back to work.   Tobacco Cessation;Weight Management/Obesity Learn how to eat healthier; increase strength; quit smoking or decrease usage; get back to work.     Review  Patient has completed 7 sessions losing one lb since his orientation visit. He is doing well in the program with some progression. He did meet with RD and said he learned some tips about eating healthy. He continues to say he is not ready to quit smoking yet and says he is not interested in a smoking cessation class. Will continue to encourage patient to quit smoking.   Patient has completed 15 sessions losing 2 lbs since last 30 day review. He continues to do well in the program with progression. He says he is learning how to eat healthier. He does feel stronger but has not returned back to work. He has not quit smoking and continues to say he is not interested in a smoking cessation class. He does not cough as much during exercise as when he started the program. Will continue to monitor for progress.   Patient has completed 21 sessions gaining 2 lbs since last 30 day review. He continues to do well in the program with progression. His smoking usage has not changed. He says he is not interested in any smoking cessation classes at this time. Information was provided on smoking cessation. He says he does feel his strength is increasing. He has not returned to work yet but does feel he is strong enough. Will continue to monitor.   Patient has completed 29 sessions losing 3 lbs since last 30 day review. He continues to do well in the program with  progression. He continues to smoke the same amount and continues to say he is not interested in smoking cessation information. He says he is feeing stronger and has more energy. He has not returned to work. He says he has learned more about healthy eating. Will continue to monitor for progress.     Expected Outcomes  Patient will continue to attend sessions and complete the program meeting his personal goals.   Patient will continue to attend sessions and complete the program meeting his personal goals.   Patient will continue to attend sessions and complete the program meeting his personal goals.   Patient will continue to attend sessions and complete the program  meeting his personal goals.        Core Components/Risk Factors/Patient Goals at Discharge (Final Review):  Goals and Risk Factor Review - 04/01/18 1353      Core Components/Risk Factors/Patient Goals Review   Personal Goals Review  Tobacco Cessation;Weight Management/Obesity   Learn how to eat healthier; increase strength; quit smoking or decrease usage; get back to work.    Review  Patient has completed 29 sessions losing 3 lbs since last 30 day review. He continues to do well in the program with progression. He continues to smoke the same amount and continues to say he is not interested in smoking cessation information. He says he is feeing stronger and has more energy. He has not returned to work. He says he has learned more about healthy eating. Will continue to monitor for progress.     Expected Outcomes  Patient will continue to attend sessions and complete the program meeting his personal goals.        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 He is currenlty smoking 3/4 ppd. He is interested iin quiting but not actively trying to quit. He has no barriers to CR. He has COPD treated  with inhalers.   Patient only attended one session since last 30 day review. He is currently being evaluated for liver mass with biospy. Will continue to monitor.   Patient stopped attending after completing 30 session due to illness and Bass of attendence. MD will be notified.         Comments: Patient stopped coming to Cardiac Rehab on 04/06/2018 after completing 30 session due to Bass of attendance and illness. Called patient to remind them of the Cardiac Rehab policy that if they do not call or come for 3 consecutive visits that they would be discharged from the CR program. Doctor will be informed.

## 2018-05-07 NOTE — Progress Notes (Signed)
Discharge Progress Report  Patient Details  Name: Adam Bass MRN: [id number] Date of Birth: 12/06/1950 Referring Provider:     [facility] [facility] from 01/05/2018 in [place]  Referring Provider  [name]       Number of Visits: 30  Reason for Discharge:  Early Exit:  Lack of attendance  Smoking History:  Social History   Tobacco Use  Smoking Status Current Every Day Smoker  . Packs/day: 0.75  . Years: 50.00  . Pack years: 37.50  . Types: Cigars  Smokeless Tobacco Never Used  Tobacco Comment   he is wearing a patch. He is smoking about 5-10 cigarettes daily.     Diagnosis:  ST elevation myocardial infarction (STEMI) involving other coronary artery of inferior wall (HCC)  Status post coronary artery stent placement  ADL [facility]:   Initial Exercise Prescription: Initial Exercise Prescription - 01/05/18 1000      Date of Initial Exercise RX and Referring Provider   Date  01/05/18    Referring Provider  [name]    Expected Discharge Date  04/06/18      NuStep   Level  1    SPM  55    Minutes  17    METs  1.9      Arm Ergometer   Level  1.5    Watts  11    RPM  41    Minutes  17    METs  1.7      Prescription Details   Frequency (times per week)  3    Duration  Progress to 30 minutes of continuous aerobic without signs/symptoms of physical distress      Intensity   THRR 40-80% of Max Heartrate  [id number]    Ratings of Perceived Exertion  11-13    Perceived Dyspnea  0-4      Progression   Progression  Continue progressive overload as per policy without signs/symptoms or physical distress.      Resistance Training   Training Prescription  Yes    Weight  1    Reps  10-15       Discharge Exercise Prescription (Final Exercise Prescription Changes): Exercise Prescription Changes - 04/28/18 1500      Response to Exercise   Comments  --   Has been out of exercise since 04/06/2018      Functional  Capacity: 6 Minute Walk    Row Name 01/05/18 1028         6 Minute Walk   Phase  Initial     Distance  1000 feet     Walk Time  6 minutes     # of Rest Breaks  0     MPH  1.89     METS  2.45     RPE  13     Perceived Dyspnea   13     VO2 Peak  10.25     Symptoms  Yes (comment)     Comments  4/10 bilateral hip pain      Resting HR  64 bpm     Resting BP  110/60     Resting Oxygen Saturation   95 %     Exercise Oxygen Saturation  during 6 min walk  92 %     Max Ex. HR  91 bpm     Max Ex. BP  138/68     2 Minute Post BP  114/66  Psychological, QOL, Others - Outcomes: PHQ 2/9: Depression screen [employer] 2/9 01/05/2018 09/12/2017 07/04/2017 04/30/2017  Decreased Interest 0 0 0 0  Down, Depressed, Hopeless 0 0 0 0  PHQ - 2 Score 0 0 0 0  Altered sleeping 1 - - -  Tired, decreased energy 1 - - -  Change in appetite 1 - - -  Feeling bad or failure about yourself  0 - - -  Trouble concentrating 0 - - -  Moving slowly or fidgety/restless 1 - - -  Suicidal thoughts 0 - - -  PHQ-9 Score 4 - - -  Difficult doing work/chores Somewhat difficult - - -    Quality of Life: Quality of Life - 01/05/18 1043      Quality of Life   Select  Quality of Life      Quality of Life Scores   Health/Function Pre  15.53 %    Socioeconomic Pre  24 %    Psych/Spiritual Pre  24.86 %    Family Pre  27 %    GLOBAL Pre  20.7 %       Personal Goals: Goals established at orientation with interventions provided to work toward goal. Personal Goals and Risk Factors at Admission - 01/05/18 0944      Core Components/Risk Factors/Patient Goals on Admission    Weight Management  Weight Maintenance    Tobacco Cessation  Yes    Intervention  Assist the participant in steps to quit. Provide individualized education and counseling about committing to Tobacco Cessation, relapse prevention, and pharmacological support that can be provided by physician.;Advice worker, assist with locating  and accessing local/national Quit Smoking programs, and support quit date choice.    Expected Outcomes  Short Term: Will demonstrate readiness to quit, by selecting a quit date.    Personal Goal Other  Yes    Personal Goal  Learn more about heart heatlhy eating; increase strength; get strong enough to get back to work. Quit smoking or reduce his smoking.     Intervention  Patient will attend CR 3 days/week and supplement with exercise 2 days/week at home by walking.     Expected Outcomes  Patient will meet his personal goals.         Personal Goals Discharge: Goals and Risk Factor Review    Row Name 01/21/18 1511 02/19/18 0734 03/16/18 0746 04/01/18 1353       Core Components/Risk Factors/Patient Goals Review   Personal Goals Review  Tobacco Cessation;Weight Management/Obesity Learn how to eat healthier; increase strength; quit smoking or decrease smoking. Get back to work.   Tobacco Cessation;Weight Management/Obesity Learn how to eat healthier; increase strength; quit smoking or reduce smoking; get back to wrok.   Tobacco Cessation;Weight Management/Obesity Learn how to eat healthier; increase strength; quit smoking ro decrease usuage; get back to work.   Tobacco Cessation;Weight Management/Obesity Learn how to eat healthier; increase strength; quit smoking or decrease usage; get back to work.     Review  Patient has completed 7 sessions losing one lb since his orientation visit. He is doing well in the program with some progression. He did meet with RD and said he learned some tips about eating healthy. He continues to say he is not ready to quit smoking yet and says he is not interested in a smoking cessation class. Will continue to encourage patient to quit smoking.   Patient has completed 15 sessions losing 2 lbs since last 30 day review. He continues  to do well in the program with progression. He says he is learning how to eat healthier. He does feel stronger but has not returned back to work.  He has not quit smoking and continues to say he is not interested in a smoking cessation class. He does not cough as much during exercise as when he started the program. Will continue to monitor for progress.   Patient has completed 21 sessions gaining 2 lbs since last 30 day review. He continues to do well in the program with progression. His smoking usage has not changed. He says he is not interested in any smoking cessation classes at this time. Information was provided on smoking cessation. He says he does feel his strength is increasing. He has not returned to work yet but does feel he is strong enough. Will continue to monitor.   Patient has completed 29 sessions losing 3 lbs since last 30 day review. He continues to do well in the program with progression. He continues to smoke the same amount and continues to say he is not interested in smoking cessation information. He says he is feeing stronger and has more energy. He has not returned to work. He says he has learned more about healthy eating. Will continue to monitor for progress.     Expected Outcomes  Patient will continue to attend sessions and complete the program meeting his personal goals.   Patient will continue to attend sessions and complete the program meeting his personal goals.   Patient will continue to attend sessions and complete the program meeting his personal goals.   Patient will continue to attend sessions and complete the program meeting his personal goals.        Exercise Goals and Review: Exercise Goals    Row Name 01/05/18 1041             Exercise Goals   Increase Physical Activity  Yes       Intervention  Provide advice, education, support and counseling about physical activity/exercise needs.;Develop an individualized exercise prescription for aerobic and resistive training based on initial evaluation findings, risk stratification, comorbidities and participant's personal goals.       Expected Outcomes  Short  Term: Attend rehab on a regular basis to increase amount of physical activity.       Increase Strength and Stamina  Yes       Intervention  Provide advice, education, support and counseling about physical activity/exercise needs.;Develop an individualized exercise prescription for aerobic and resistive training based on initial evaluation findings, risk stratification, comorbidities and participant's personal goals.       Expected Outcomes  Short Term: Increase workloads from initial exercise prescription for resistance, speed, and METs.;Short Term: Perform resistance training exercises routinely during rehab and add in resistance training at home;Long Term: Improve cardiorespiratory fitness, muscular endurance and strength as measured by increased METs and functional capacity (6MWT)       Able to understand and use rate of perceived exertion (RPE) scale  Yes       Intervention  Provide education and explanation on how to use RPE scale       Expected Outcomes  Short Term: Able to use RPE daily in rehab to express subjective intensity level;Long Term:  Able to use RPE to guide intensity level when exercising independently       Able to understand and use Dyspnea scale  Yes       Intervention  Provide education and explanation on how  to use Dyspnea scale       Expected Outcomes  Short Term: Able to use Dyspnea scale daily in rehab to express subjective sense of shortness of breath during exertion;Long Term: Able to use Dyspnea scale to guide intensity level when exercising independently       Knowledge and understanding of Target Heart Rate Range (THRR)  Yes       Intervention  Provide education and explanation of THRR including how the numbers were predicted and where they are located for reference       Expected Outcomes  Short Term: Able to state/look up THRR;Long Term: Able to use THRR to govern intensity when exercising independently;Short Term: Able to use daily as guideline for intensity in rehab        Able to check pulse independently  Yes       Intervention  Provide education and demonstration on how to check pulse in carotid and radial arteries.;Review the importance of being able to check your own pulse for safety during independent exercise       Expected Outcomes  Short Term: Able to explain why pulse checking is important during independent exercise;Long Term: Able to check pulse independently and accurately       Understanding of Exercise Prescription  Yes       Intervention  Provide education, explanation, and written materials on patient's individual exercise prescription       Expected Outcomes  Short Term: Able to explain program exercise prescription;Long Term: Able to explain home exercise prescription to exercise independently          Exercise Goals Re-Evaluation: Exercise Goals Re-Evaluation    Row Name 01/19/18 1516 02/18/18 1428 03/09/18 1525 03/31/18 1527 04/21/18 1451     Exercise Goal Re-Evaluation   Exercise Goals Review  Increase Physical Activity;Increase Strength and Stamina;Able to understand and use rate of perceived exertion (RPE) scale;Knowledge and understanding of Target Heart Rate Range (THRR);Understanding of Exercise Prescription;Able to understand and use Dyspnea scale;Able to check pulse independently  Increase Physical Activity;Increase Strength and Stamina;Able to understand and use rate of perceived exertion (RPE) scale;Knowledge and understanding of Target Heart Rate Range (THRR);Understanding of Exercise Prescription;Able to understand and use Dyspnea scale;Able to check pulse independently  Increase Physical Activity;Increase Strength and Stamina;Able to understand and use rate of perceived exertion (RPE) scale;Knowledge and understanding of Target Heart Rate Range (THRR);Understanding of Exercise Prescription;Able to understand and use Dyspnea scale;Able to check pulse independently  Increase Physical Activity;Able to understand and use rate of  perceived exertion (RPE) scale;Increase Strength and Stamina;Knowledge and understanding of Target Heart Rate Range (THRR);Able to check pulse independently;Understanding of Exercise Prescription  Increase Physical Activity;Able to understand and use rate of perceived exertion (RPE) scale;Increase Strength and Stamina;Knowledge and understanding of Target Heart Rate Range (THRR);Able to check pulse independently;Understanding of Exercise Prescription   Comments  Patient is new to the program. He has completed 6 sessions. He has tolerated exercise well. We will continue to monitor him and progress as tolerated.   Patient has attended 15 sessions now. He reports feeling stronger and feels this is helping him to be able to do more around the house. He is progressing right on track.   Patient does well in the program when he is feeling well. He has been under the weather the last week or so. He tolerates exercise well when he is able to come.   Pt. has been attending rehab more regularly and feeling better. He works as  hard as he can most days but is rather weak which sets him back some. He always pushes himself though to increase his distance on the machines.   Pt. has not been in rehab since 04/06/2018 due to sickness. When he returns we will continue to monitor him and progress as tolerated.    Expected Outcomes  Increase strength, learn to eat healthier.   Increase strength, continue to make healthier eating choices.   Increase strength, continue to make healthier eating choices and decerase smoking.   Increase strength, continue to make healthier eating choices and decerase smoking.   Increase strength, continue to make healthier eating choices and decerase smoking.       Nutrition & Weight - Outcomes: Pre Biometrics - 01/05/18 1041      Pre Biometrics   Height  5' 9"  (1.753 m)    Weight  64.9 kg    Waist Circumference  30.25 inches    Hip Circumference  32.5 inches    Waist to Hip Ratio  0.93 %     BMI (Calculated)  21.12    Triceps Skinfold  3 mm    % Body Fat  14.2 %    Grip Strength  19.4 kg    Flexibility  16.3 in    Single Leg Stand  5 seconds        Nutrition: Nutrition Therapy & Goals - 01/15/18 1553      Personal Nutrition Goals   Nutrition Goal  For heart healthy choices add >50% of whole grains, make half their plate fruits and vegetables. Discuss the difference between starchy vegetables and leafy greens, and how leafy vegetables provide fiber, helps maintain healthy weight, helps control blood glucose, and lowers cholesterol.  Discuss purchasing fresh or frozen vegetable to reduce sodium and not to add grease, fat or sugar. Consume <18oz of red meat per week. Consume lean cuts of meats and very little of meats high in sodium and nitrates such as pork and lunch meats. Discussed portion control for all food groups.      Comments  Patient met with RD 01/15/18.      Intervention Plan   Intervention  Nutrition handout(s) given to patient.    Expected Outcomes  Short Term Goal: Understand basic principles of dietary content, such as calories, fat, sodium, cholesterol and nutrients.       Nutrition Discharge: Nutrition Assessments - 01/05/18 0942      MEDFICTS Scores   Pre Score  50       Education Questionnaire Score: Knowledge Questionnaire Score - 01/05/18 0942      Knowledge Questionnaire Score   Pre Score  25/28

## 2018-05-07 NOTE — Addendum Note (Signed)
Encounter addended by: Dwana Melena, RN on: 05/07/2018 8:15 AM  Actions taken: Visit Navigator Flowsheet section accepted, Clinical Note Signed, Episode resolved

## 2018-05-13 ENCOUNTER — Emergency Department (HOSPITAL_COMMUNITY): Payer: BLUE CROSS/BLUE SHIELD

## 2018-05-13 ENCOUNTER — Other Ambulatory Visit: Payer: Self-pay

## 2018-05-13 ENCOUNTER — Inpatient Hospital Stay (HOSPITAL_COMMUNITY)
Admission: EM | Admit: 2018-05-13 | Discharge: 2018-05-18 | DRG: 193 | Disposition: A | Discharge status: Home / Self Care | Payer: BLUE CROSS/BLUE SHIELD | Attending: Pulmonary Disease | Admitting: Pulmonary Disease

## 2018-05-13 ENCOUNTER — Encounter (HOSPITAL_COMMUNITY): Payer: Self-pay | Admitting: Emergency Medicine

## 2018-05-13 DIAGNOSIS — J441 Chronic obstructive pulmonary disease with (acute) exacerbation: Secondary | ICD-10-CM | POA: Diagnosis not present

## 2018-05-13 DIAGNOSIS — R000 Tachycardia, unspecified: Secondary | ICD-10-CM | POA: Diagnosis not present

## 2018-05-13 DIAGNOSIS — I10 Essential (primary) hypertension: Secondary | ICD-10-CM | POA: Diagnosis not present

## 2018-05-13 DIAGNOSIS — J09X2 Influenza due to identified novel influenza A virus with other respiratory manifestations: Secondary | ICD-10-CM | POA: Diagnosis not present

## 2018-05-13 DIAGNOSIS — I252 Old myocardial infarction: Secondary | ICD-10-CM

## 2018-05-13 DIAGNOSIS — J9621 Acute and chronic respiratory failure with hypoxia: Secondary | ICD-10-CM | POA: Diagnosis not present

## 2018-05-13 DIAGNOSIS — Z7951 Long term (current) use of inhaled steroids: Secondary | ICD-10-CM | POA: Diagnosis not present

## 2018-05-13 DIAGNOSIS — Z7982 Long term (current) use of aspirin: Secondary | ICD-10-CM

## 2018-05-13 DIAGNOSIS — I2510 Atherosclerotic heart disease of native coronary artery without angina pectoris: Secondary | ICD-10-CM | POA: Diagnosis present

## 2018-05-13 DIAGNOSIS — Z7902 Long term (current) use of antithrombotics/antiplatelets: Secondary | ICD-10-CM | POA: Diagnosis not present

## 2018-05-13 DIAGNOSIS — B1920 Unspecified viral hepatitis C without hepatic coma: Secondary | ICD-10-CM | POA: Diagnosis present

## 2018-05-13 DIAGNOSIS — Z85828 Personal history of other malignant neoplasm of skin: Secondary | ICD-10-CM | POA: Diagnosis not present

## 2018-05-13 DIAGNOSIS — F17290 Nicotine dependence, other tobacco product, uncomplicated: Secondary | ICD-10-CM | POA: Diagnosis present

## 2018-05-13 DIAGNOSIS — Z923 Personal history of irradiation: Secondary | ICD-10-CM | POA: Diagnosis not present

## 2018-05-13 DIAGNOSIS — Z91030 Bee allergy status: Secondary | ICD-10-CM

## 2018-05-13 DIAGNOSIS — C3432 Malignant neoplasm of lower lobe, left bronchus or lung: Secondary | ICD-10-CM | POA: Diagnosis not present

## 2018-05-13 DIAGNOSIS — Z85118 Personal history of other malignant neoplasm of bronchus and lung: Secondary | ICD-10-CM

## 2018-05-13 DIAGNOSIS — Z803 Family history of malignant neoplasm of breast: Secondary | ICD-10-CM

## 2018-05-13 DIAGNOSIS — Z79899 Other long term (current) drug therapy: Secondary | ICD-10-CM | POA: Diagnosis not present

## 2018-05-13 DIAGNOSIS — F17210 Nicotine dependence, cigarettes, uncomplicated: Secondary | ICD-10-CM | POA: Diagnosis present

## 2018-05-13 DIAGNOSIS — J90 Pleural effusion, not elsewhere classified: Secondary | ICD-10-CM | POA: Diagnosis not present

## 2018-05-13 DIAGNOSIS — Z955 Presence of coronary angioplasty implant and graft: Secondary | ICD-10-CM | POA: Diagnosis not present

## 2018-05-13 DIAGNOSIS — E785 Hyperlipidemia, unspecified: Secondary | ICD-10-CM | POA: Diagnosis not present

## 2018-05-13 DIAGNOSIS — J111 Influenza due to unidentified influenza virus with other respiratory manifestations: Secondary | ICD-10-CM | POA: Diagnosis not present

## 2018-05-13 DIAGNOSIS — R0602 Shortness of breath: Secondary | ICD-10-CM | POA: Diagnosis not present

## 2018-05-13 LAB — CBC WITH DIFFERENTIAL/PLATELET
Abs Immature Granulocytes: 0.06 10*3/uL (ref 0.00–0.07)
Basophils Absolute: 0.1 10*3/uL (ref 0.0–0.1)
Basophils Relative: 0 %
EOS PCT: 1 %
Eosinophils Absolute: 0.1 10*3/uL (ref 0.0–0.5)
HCT: 49.1 % (ref 39.0–52.0)
HEMOGLOBIN: 16.7 g/dL (ref 13.0–17.0)
Immature Granulocytes: 1 %
Lymphocytes Relative: 11 %
Lymphs Abs: 1.2 10*3/uL (ref 0.7–4.0)
MCH: 35.7 pg — ABNORMAL HIGH (ref 26.0–34.0)
MCHC: 34 g/dL (ref 30.0–36.0)
MCV: 104.9 fL — ABNORMAL HIGH (ref 80.0–100.0)
Monocytes Absolute: 0.8 10*3/uL (ref 0.1–1.0)
Monocytes Relative: 7 %
Neutro Abs: 9 10*3/uL — ABNORMAL HIGH (ref 1.7–7.7)
Neutrophils Relative %: 80 %
Platelets: 290 10*3/uL (ref 150–400)
RBC: 4.68 MIL/uL (ref 4.22–5.81)
RDW: 11.3 % — ABNORMAL LOW (ref 11.5–15.5)
WBC: 11.2 10*3/uL — ABNORMAL HIGH (ref 4.0–10.5)
nRBC: 0 % (ref 0.0–0.2)

## 2018-05-13 LAB — COMPREHENSIVE METABOLIC PANEL
ALK PHOS: 97 U/L (ref 38–126)
ALT: 35 U/L (ref 0–44)
AST: 36 U/L (ref 15–41)
Albumin: 4.3 g/dL (ref 3.5–5.0)
Anion gap: 9 (ref 5–15)
BUN: 14 mg/dL (ref 8–23)
CALCIUM: 8.6 mg/dL — AB (ref 8.9–10.3)
CO2: 24 mmol/L (ref 22–32)
Chloride: 100 mmol/L (ref 98–111)
Creatinine, Ser: 0.79 mg/dL (ref 0.61–1.24)
GFR calc Af Amer: >60 mL/min (ref >60)
GFR calc non Af Amer: >60 mL/min (ref >60)
Glucose, Bld: 163 mg/dL — ABNORMAL HIGH (ref 70–99)
Potassium: 3.5 mmol/L (ref 3.5–5.1)
Sodium: 133 mmol/L — ABNORMAL LOW (ref 135–145)
Total Bilirubin: 1.8 mg/dL — ABNORMAL HIGH (ref 0.3–1.2)
Total Protein: 8.5 g/dL — ABNORMAL HIGH (ref 6.5–8.1)

## 2018-05-13 LAB — INFLUENZA PANEL BY PCR (TYPE A & B)
Influenza A By PCR: POSITIVE — AB
Influenza B By PCR: NEGATIVE

## 2018-05-13 LAB — TROPONIN I: Troponin I: 0.03 ng/mL (ref <0.03)

## 2018-05-13 LAB — I-STAT TROPONIN, ED: Troponin i, poc: 0 ng/mL (ref 0.00–0.08)

## 2018-05-13 MED ORDER — IPRATROPIUM-ALBUTEROL 0.5-2.5 (3) MG/3ML IN SOLN
3.0000 mL | RESPIRATORY_TRACT | Status: DC | PRN
  Administered 2018-05-13 – 2018-05-17 (×2): 3 mL via RESPIRATORY_TRACT
  Filled 2018-05-13 (×2): qty 3

## 2018-05-13 MED ORDER — ASPIRIN EC 81 MG PO TBEC
81.0000 mg | DELAYED_RELEASE_TABLET | Freq: Every day | ORAL | Status: DC
  Administered 2018-05-14 – 2018-05-18 (×5): 81 mg via ORAL
  Filled 2018-05-13 (×5): qty 1

## 2018-05-13 MED ORDER — POLYETHYLENE GLYCOL 3350 17 G PO PACK: 17.0000 g | PACK | Freq: Every day | ORAL | Status: DC | PRN

## 2018-05-13 MED ORDER — ACETAMINOPHEN 325 MG PO TABS: 650.0000 mg | ORAL_TABLET | Freq: Four times a day (QID) | ORAL | Status: DC | PRN

## 2018-05-13 MED ORDER — IPRATROPIUM-ALBUTEROL 0.5-2.5 (3) MG/3ML IN SOLN
3.0000 mL | Freq: Four times a day (QID) | RESPIRATORY_TRACT | Status: DC
  Administered 2018-05-14 – 2018-05-17 (×13): 3 mL via RESPIRATORY_TRACT
  Filled 2018-05-13 (×14): qty 3

## 2018-05-13 MED ORDER — ALBUTEROL SULFATE (2.5 MG/3ML) 0.083% IN NEBU
2.5000 mg | INHALATION_SOLUTION | Freq: Once | RESPIRATORY_TRACT | Status: AC
  Administered 2018-05-13: 2.5 mg via RESPIRATORY_TRACT
  Filled 2018-05-13: qty 3

## 2018-05-13 MED ORDER — IPRATROPIUM-ALBUTEROL 0.5-2.5 (3) MG/3ML IN SOLN
3.0000 mL | Freq: Once | RESPIRATORY_TRACT | Status: AC
  Administered 2018-05-13: 3 mL via RESPIRATORY_TRACT
  Filled 2018-05-13: qty 3

## 2018-05-13 MED ORDER — TICAGRELOR 90 MG PO TABS
90.0000 mg | ORAL_TABLET | Freq: Two times a day (BID) | ORAL | Status: DC
  Administered 2018-05-14 – 2018-05-18 (×8): 90 mg via ORAL
  Filled 2018-05-13 (×8): qty 1

## 2018-05-13 MED ORDER — ONDANSETRON HCL 4 MG/2ML IJ SOLN: 4.0000 mg | Freq: Four times a day (QID) | INTRAMUSCULAR | Status: DC | PRN

## 2018-05-13 MED ORDER — GUAIFENESIN-DM 100-10 MG/5ML PO SYRP
10.0000 mL | ORAL_SOLUTION | Freq: Three times a day (TID) | ORAL | Status: DC
  Administered 2018-05-13 – 2018-05-14 (×2): 10 mL via ORAL
  Filled 2018-05-13 (×2): qty 10

## 2018-05-13 MED ORDER — BUDESONIDE 0.25 MG/2ML IN SUSP
0.2500 mg | Freq: Two times a day (BID) | RESPIRATORY_TRACT | Status: DC
  Administered 2018-05-13 – 2018-05-18 (×10): 0.25 mg via RESPIRATORY_TRACT
  Filled 2018-05-13 (×10): qty 2

## 2018-05-13 MED ORDER — ACETAMINOPHEN 650 MG RE SUPP: 650.0000 mg | Freq: Four times a day (QID) | RECTAL | Status: DC | PRN

## 2018-05-13 MED ORDER — ENOXAPARIN SODIUM 40 MG/0.4ML ~~LOC~~ SOLN
40.0000 mg | SUBCUTANEOUS | Status: DC
  Administered 2018-05-13 – 2018-05-18 (×5): 40 mg via SUBCUTANEOUS
  Filled 2018-05-13 (×5): qty 0.4

## 2018-05-13 MED ORDER — ONDANSETRON HCL 4 MG PO TABS: 4.0000 mg | ORAL_TABLET | Freq: Four times a day (QID) | ORAL | Status: DC | PRN

## 2018-05-13 MED ORDER — MAGNESIUM SULFATE 2 GM/50ML IV SOLN
2.0000 g | Freq: Once | INTRAVENOUS | Status: AC
  Administered 2018-05-13: 2 g via INTRAVENOUS
  Filled 2018-05-13: qty 50

## 2018-05-13 MED ORDER — METHYLPREDNISOLONE SODIUM SUCC 125 MG IJ SOLR
125.0000 mg | Freq: Once | INTRAMUSCULAR | Status: AC
  Administered 2018-05-13: 125 mg via INTRAVENOUS
  Filled 2018-05-13: qty 2

## 2018-05-13 MED ORDER — AZITHROMYCIN 250 MG PO TABS
500.0000 mg | ORAL_TABLET | Freq: Every day | ORAL | Status: DC
  Administered 2018-05-14 – 2018-05-17 (×5): 500 mg via ORAL
  Filled 2018-05-13 (×5): qty 2

## 2018-05-13 MED ORDER — METHYLPREDNISOLONE SODIUM SUCC 125 MG IJ SOLR
60.0000 mg | Freq: Two times a day (BID) | INTRAMUSCULAR | Status: DC
  Administered 2018-05-14 – 2018-05-17 (×7): 60 mg via INTRAVENOUS
  Filled 2018-05-13 (×7): qty 2

## 2018-05-13 MED ORDER — ALPRAZOLAM 0.5 MG PO TABS
0.5000 mg | ORAL_TABLET | Freq: Every day | ORAL | Status: AC
  Administered 2018-05-13: 0.5 mg via ORAL
  Filled 2018-05-13: qty 1

## 2018-05-13 NOTE — ED Provider Notes (Signed)
[facility] EMERGENCY DEPARTMENT Provider Note   CSN: [id number] Arrival date & time: 05/13/18  4010     History   Chief Complaint Chief Complaint  Patient presents with  . Shortness of Breath    HPI Adam Bass is a 68 y.o. male.  Patient complains of shortness of breath and cough.  Patient has a history of COPD and he saw his lung doctor who gave him a shot of some medicine a couple weeks ago but he has not improved  The history is provided by the patient. No language interpreter was used.  Shortness of Breath  Severity:  Moderate Onset quality:  Sudden Timing:  Constant Progression:  Worsening Chronicity:  Recurrent Context: activity   Relieved by:  Nothing Worsened by:  Nothing Ineffective treatments:  None tried Associated symptoms: no abdominal pain, no chest pain, no cough, no headaches and no rash   Risk factors: no recent alcohol use     Past Medical History:  Diagnosis Date  . COPD (chronic obstructive pulmonary disease) ([place])    for 10 years or more   . History of radiation therapy 06/03/17- 06/09/17   Left Lung treated to 54 Gy with 3 fx of 18 Gy. SBRT  . Lung cancer ([place])   . Myocardial infarction ([place])   . Skin cancer of face 07/2015   skin cancer removed from face by Dr. Tarri Glenn in [facility]    Patient Active Problem List   Diagnosis Date Noted  . COPD exacerbation ([place]) 05/13/2018  . Smoking 11/27/2017  . ETOH abuse 11/27/2017  . Heart attack ([place])   . Hyperlipidemia with target LDL less than 70   . LFT elevation   . Acute ST elevation myocardial infarction (STEMI) of inferior wall ([place]) 11/23/2017  . 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 Primary cancer of left lower lobe of lung ([place]) 04/30/2017  . COPD GOLD III/ still smoking cigars  07/21/2012    Past Surgical History:  Procedure Laterality Date  . COLONOSCOPY  2004   Dr. Truett Perna  . COLONOSCOPY N/A 08/27/2012   Procedure: COLONOSCOPY;  Surgeon: Rogene Houston, MD;  [facility];  Service: Endoscopy;  Laterality: N/A;  1200  . CORONARY STENT INTERVENTION N/A 11/23/2017   Procedure: CORONARY STENT INTERVENTION;  Surgeon: Jettie Booze, MD;  Location: [place] CV LAB;  Service: Cardiovascular;  Laterality: N/A;  . IR RADIOLOGIST EVAL & MGMT  03/04/2018  . LEFT HEART CATH AND CORONARY ANGIOGRAPHY N/A 11/23/2017   Procedure: LEFT HEART CATH AND CORONARY ANGIOGRAPHY;  Surgeon: Jettie Booze, MD;  Location: [place] CV LAB;  Service: Cardiovascular;  Laterality: N/A;        Home Medications    Prior to Admission medications   Medication Sig Start Date End Date Taking? Authorizing Provider  albuterol (PROAIR HFA) 108 (90 Base) MCG/ACT inhaler Inhale 2 puffs into the lungs every 6 (six) hours as needed for wheezing.     [provider]  albuterol (PROVENTIL) 4 MG tablet Take 4 mg by mouth 3 (three) times daily.    [provider]  aspirin EC 81 MG tablet Take 81 mg by mouth daily.    [provider]  bisoprolol (ZEBETA) 5 MG tablet Take 0.5 tablets (2.5 mg total) by mouth daily. 11/28/17   [name] B, NP  Blood Pressure KIT 1 Units by Does not apply route daily. 11/27/17   Mancel Bale,  Rosalene Billings, NP  ezetimibe (ZETIA) 10 MG tablet Take 1 tablet (10 mg total) by mouth daily. 11/27/17 05/26/18  Cheryln Manly, NP  famotidine (PEPCID) 20 MG tablet Take 20 mg by mouth daily as needed for heartburn or indigestion.    [provider]  fluticasone (FLONASE) 50 MCG/ACT nasal spray Place 1 spray into both nostrils daily as needed for allergies.     [provider]  Glecaprevir-Pibrentasvir (MAVYRET) 100-40 MG TABS Take 3 tablets by mouth daily.     [provider]  lisinopril (PRINIVIL,ZESTRIL) 2.5 MG tablet Take 1 tablet (2.5 mg total) by mouth daily. 04/20/18 07/19/18  Arnoldo Lenis, MD  Multiple Vitamins-Minerals  (MULTIVITAMIN WITH MINERALS) tablet Take 1 tablet by mouth daily.    [provider]  nitroGLYCERIN (NITROSTAT) 0.4 MG SL tablet Place 1 tablet (0.4 mg total) under the tongue every 5 (five) minutes as needed. 12/11/17   [name], NP  pantoprazole (PROTONIX) 40 MG tablet Take 40 mg by mouth daily.  02/02/18   [provider]  Potassium 99 MG TABS Take 1 tablet by mouth daily.     [provider]  Tetrahydrozoline HCl (VISINE OP) Place 1 drop into both eyes daily as needed (Dry Eyes).    [provider]  ticagrelor (BRILINTA) 90 MG TABS tablet Take 1 tablet (90 mg total) by mouth 2 (two) times daily. 11/27/17   Cheryln Manly, NP  TRELEGY ELLIPTA 100-62.5-25 MCG/INH AEPB INHALE 1 PUFF INTO THE LUNGS EVERY DAY Patient taking differently: Inhale 1 puff into the lungs daily.  12/24/17   Tanda Rockers, MD  vitamin E 400 UNIT capsule Take 1 tablet by mouth daily.     [provider]    Family History Family History  Problem Relation Age of Onset  . Breast cancer Sister   . Colon cancer Neg Hx     Social History Social History   Tobacco Use  . Smoking status: Current Every Day Smoker    Packs/day: 0.75    Years: 50.00    Pack years: 37.50    Types: Cigars  . Smokeless tobacco: Never Used  . Tobacco comment: he is wearing a patch. He is smoking about 5-10 cigarettes daily.   Substance Use Topics  . Alcohol use: Yes    Alcohol/week: 8.0 standard drinks    Types: 8 Cans of beer per week    Comment: 8 beers weekly.   . Drug use: No     Allergies   Bee venom   Review of Systems Review of Systems  Constitutional: Negative for appetite change and fatigue.  HENT: Negative for congestion, ear discharge and sinus pressure.   Eyes: Negative for discharge.  Respiratory: Positive for shortness of breath. Negative for cough.   Cardiovascular: Negative for chest pain.  Gastrointestinal: Negative for abdominal pain and diarrhea.    Genitourinary: Negative for frequency and hematuria.  Musculoskeletal: Negative for back pain.  Skin: Negative for rash.  Neurological: Negative for seizures and headaches.  Psychiatric/Behavioral: Negative for hallucinations.     Physical Exam Updated Vital Signs BP (!) 113/53   Pulse 93   Temp 98 F (36.7 C) (Oral)   Resp (!) 22   Ht 5' 9"  (1.753 m)   Wt 68 kg   SpO2 94%   BMI 22.15 kg/m   Physical Exam Vitals signs and nursing note reviewed.  Constitutional:      Appearance: He is well-developed.  HENT:  Head: Normocephalic.     Nose: Nose normal.  Eyes:     General: No scleral icterus.    Conjunctiva/sclera: Conjunctivae normal.  Neck:     Musculoskeletal: Neck supple.     Thyroid: No thyromegaly.  Cardiovascular:     Rate and Rhythm: Normal rate and regular rhythm.     Heart sounds: No murmur. No friction rub. No gallop.   Pulmonary:     Breath sounds: No stridor. Wheezing present. No rales.  Chest:     Chest wall: No tenderness.  Abdominal:     General: There is no distension.     Tenderness: There is no abdominal tenderness. There is no rebound.  Musculoskeletal: Normal range of motion.  Lymphadenopathy:     Cervical: No cervical adenopathy.  Skin:    Findings: No erythema or rash.  Neurological:     Mental Status: He is oriented to person, place, and time.     Motor: No abnormal muscle tone.     Coordination: Coordination normal.  Psychiatric:        Behavior: Behavior normal.      ED Treatments / Results  Labs (all labs ordered are listed, but only abnormal results are displayed) Labs Reviewed  CBC WITH DIFFERENTIAL/PLATELET - Abnormal; Notable for the following components:      Result Value   WBC 11.2 (*)    MCV 104.9 (*)    MCH 35.7 (*)    RDW 11.3 (*)    Neutro Abs 9.0 (*)    All other components within normal limits  COMPREHENSIVE METABOLIC PANEL - Abnormal; Notable for the following components:   Sodium 133 (*)    Glucose,  Bld 163 (*)    Calcium 8.6 (*)    Total Protein 8.5 (*)    Total Bilirubin 1.8 (*)    All other components within normal limits  TROPONIN I - Abnormal; Notable for the following components:   Troponin I 0.03 (*)    All other components within normal limits  TROPONIN I  TROPONIN I  INFLUENZA PANEL BY PCR (TYPE A & B)  I-STAT TROPONIN, ED    EKG EKG Interpretation  Date/Time:  Wednesday May 13 2018 19:08:57 EST Ventricular Rate:  101 PR Interval:    QRS Duration: 117 QT Interval:  347 QTC Calculation: 450 R Axis:   -147 Text Interpretation:  Sinus tachycardia Atrial premature complex Biatrial enlargement IRBBB and LPFB Low voltage, precordial leads Consider anterior infarct Baseline wander in lead(s) V3 Confirmed by Milton Ferguson [phone]) on 05/13/2018 8:51:28 PM   Radiology Dg Chest Portable 1 View  Result Date: 05/13/2018 CLINICAL DATA:  Shortness of breath, lung cancer, smoker, emphysema EXAM: PORTABLE CHEST 1 VIEW COMPARISON:  03/05/2018 FINDINGS: Hyperinflation noted compatible with background COPD/emphysema. Left upper lobe nodularity and bandlike scarring noted, better demonstrated by comparison CT. Additional nodules by CT in the left lung are not well appreciated by plain radiography. No superimposed pneumonia, significant collapse or consolidation. Negative for edema, large effusion or pneumothorax. Trachea midline. Normal heart size and vascularity. Aorta atherosclerotic. IMPRESSION: Hyperinflation compatible with COPD/emphysema Left upper lobe nodularity and adjacent scarring. No significant interval change or superimposed acute process by plain radiography. Electronically Signed   By: Jerilynn Mages.  Shick M.D.   On: 05/13/2018 19:40    Procedures Procedures (including critical care time)  Medications Ordered in ED Medications  ipratropium-albuterol (DUONEB) 0.5-2.5 (3) MG/3ML nebulizer solution 3 mL (3 mLs Nebulization Given 05/13/18 1917)  albuterol (PROVENTIL) (2.5 MG/3ML)  0.083% nebulizer solution 2.5 mg (2.5 mg Nebulization Given 05/13/18 1917)  magnesium sulfate IVPB 2 g 50 mL (0 g Intravenous Stopped 05/13/18 2043)  methylPREDNISolone sodium succinate (SOLU-MEDROL) 125 mg/2 mL injection 125 mg (125 mg Intravenous Given 05/13/18 1925)     Initial Impression / Assessment and Plan / ED Course  I have reviewed the triage vital signs and the nursing notes.  Pertinent labs & imaging results that were available during my care of the patient were reviewed by me and considered in my medical decision making (see chart for details).     Patient with COPD exacerbation.  He will be admitted to medicine Final Clinical Impressions(s) / ED Diagnoses   Final diagnoses:  COPD exacerbation [facility])    ED Discharge Orders    None       Milton Ferguson, MD 05/13/18 2152

## 2018-05-13 NOTE — ED Triage Notes (Signed)
Patient complaining of shortness of breath starting this morning. Denies chest pain. States was seen at [place] and [place] and has chest x-ray disc confirming right pleural effusion. Patient's O2 saturation 88% on room air at triage.

## 2018-05-13 NOTE — ED Notes (Signed)
Pt o2 88% on ra.   Placed on 2l o2 via . o2 sat is 95%.

## 2018-05-13 NOTE — H&P (Signed)
History and Physical    Adam Bass:[id number] DOB: Jul 13, 1950 DOA: 05/13/2018  PCP: Sinda Du, MD   Patient coming from:Home  I have personally briefly reviewed patient's old medical records in [place]  Chief Complaint: SOB, Cough  HPI: Adam Bass is a 68 y.o. male with medical history significant for cancer, COPD Gold stage III, STEMI, who was brought to the ED with complaints of worsening shortness of breath and cough over the past month but significantly worse over the past 24 hours.  Cough productive of whitish sputum.  There is associated wheezing.  No fever or chills.  No nasal congestion, no sore throat, no body aches.  He is not on home O2.  He reports compliance with his Brilinta and aspirin and other medications.  ED Course: Initial tachycardia to 108, O2 sats 89% on room air improved with 2 L.  Troponin mildly elevated 0.03.  EKG unchanged.  Mild leukocytosis 11.2.  Port chest x-ray negative for acute abnormality.  Patient was given bronchodilators, magnesium Solu-Medrol.  With persistent wheezing and O2 requirement hospitalist to admit for COPD exacerbation.  Review of Systems: As per HPI all other systems reviewed and negative.  Past Medical History:  Diagnosis Date  . COPD (chronic obstructive pulmonary disease) ([place])    for 10 years or more   . History of radiation therapy 06/03/17- 06/09/17   Left Lung treated to 54 Gy with 3 fx of 18 Gy. SBRT  . Lung cancer ([place])   . Myocardial infarction ([place])   . Skin cancer of face 07/2015   skin cancer removed from face by Dr. Tarri Glenn in [place]    Past Surgical History:  Procedure Laterality Date  . COLONOSCOPY  2004   Dr. Truett Perna  . COLONOSCOPY N/A 08/27/2012   Procedure: COLONOSCOPY;  Surgeon: Rogene Houston, MD;  [facility];  Service: Endoscopy;  Laterality: N/A;  1200  . CORONARY STENT INTERVENTION N/A 11/23/2017   Procedure: CORONARY STENT INTERVENTION;  Surgeon: Jettie Booze, MD;  Location: [place] CV LAB;  Service: Cardiovascular;  Laterality: N/A;  . IR RADIOLOGIST EVAL & MGMT  03/04/2018  . LEFT HEART CATH AND CORONARY ANGIOGRAPHY N/A 11/23/2017   Procedure: LEFT HEART CATH AND CORONARY ANGIOGRAPHY;  Surgeon: Jettie Booze, MD;  Location: [place] CV LAB;  Service: Cardiovascular;  Laterality: N/A;     reports that he has been smoking cigars. He has a 37.50 pack-year smoking history. He has never used smokeless tobacco. He reports current alcohol use of about 8.0 standard drinks of alcohol per week. He reports that he does not use drugs.  Allergies  Allergen Reactions  . Bee Venom Hives    Family History  Problem Relation Age of Onset  . Breast cancer Sister   . Colon cancer Neg Hx     Prior to Admission medications   Medication Sig Start Date End Date Taking? Authorizing Provider  albuterol (PROAIR HFA) 108 (90 Base) MCG/ACT inhaler Inhale 2 puffs into the lungs every 6 (six) hours as needed for wheezing.     [provider]  albuterol (PROVENTIL) 4 MG tablet Take 4 mg by mouth 3 (three) times daily.    [provider]  aspirin EC 81 MG tablet Take 81 mg by mouth daily.    [provider]  bisoprolol (ZEBETA) 5 MG tablet Take 0.5 tablets (2.5 mg total) by mouth daily. 11/28/17   Cheryln Manly, NP  Blood  Pressure KIT 1 Units by Does not apply route daily. 11/27/17   Cheryln Manly, NP  ezetimibe (ZETIA) 10 MG tablet Take 1 tablet (10 mg total) by mouth daily. 11/27/17 05/26/18  Cheryln Manly, NP  famotidine (PEPCID) 20 MG tablet Take 20 mg by mouth daily as needed for heartburn or indigestion.    [provider]  fluticasone (FLONASE) 50 MCG/ACT nasal spray Place 1 spray into both nostrils daily as needed for allergies.     [provider]  Glecaprevir-Pibrentasvir (MAVYRET) 100-40 MG TABS Take 3 tablets by mouth daily.     [provider]  lisinopril  (PRINIVIL,ZESTRIL) 2.5 MG tablet Take 1 tablet (2.5 mg total) by mouth daily. 04/20/18 07/19/18  Arnoldo Lenis, MD  Multiple Vitamins-Minerals (MULTIVITAMIN WITH MINERALS) tablet Take 1 tablet by mouth daily.    [provider]  nitroGLYCERIN (NITROSTAT) 0.4 MG SL tablet Place 1 tablet (0.4 mg total) under the tongue every 5 (five) minutes as needed. 12/11/17   [name], NP  pantoprazole (PROTONIX) 40 MG tablet Take 40 mg by mouth daily.  02/02/18   [provider]  Potassium 99 MG TABS Take 1 tablet by mouth daily.     [provider]  Tetrahydrozoline HCl (VISINE OP) Place 1 drop into both eyes daily as needed (Dry Eyes).    [provider]  ticagrelor (BRILINTA) 90 MG TABS tablet Take 1 tablet (90 mg total) by mouth 2 (two) times daily. 11/27/17   Cheryln Manly, NP  TRELEGY ELLIPTA 100-62.5-25 MCG/INH AEPB INHALE 1 PUFF INTO THE LUNGS EVERY DAY Patient taking differently: Inhale 1 puff into the lungs daily.  12/24/17   Tanda Rockers, MD  vitamin E 400 UNIT capsule Take 1 tablet by mouth daily.     [provider]    Physical Exam: Vitals:   05/13/18 1930 05/13/18 1940 05/13/18 2059 05/13/18 2100  BP: 121/69  (!) 115/58 (!) 115/58  Pulse: (!) 108 99 99 96  Resp: (!) 22 (!) 22 (!) 24 (!) 29  Temp:      TempSrc:      SpO2: 98% 94% 94% 94%  Weight:      Height:        Constitutional: Moderate increased work of breathing Vitals:   05/13/18 1930 05/13/18 1940 05/13/18 2059 05/13/18 2100  BP: 121/69  (!) 115/58 (!) 115/58  Pulse: (!) 108 99 99 96  Resp: (!) 22 (!) 22 (!) 24 (!) 29  Temp:      TempSrc:      SpO2: 98% 94% 94% 94%  Weight:      Height:       Eyes: PERRL, lids and conjunctivae normal ENMT: Mucous membranes are dry. Posterior pharynx clear of any exudate or lesions.  Neck: normal, supple, no masses, no thyromegaly Respiratory: Globally reduced air entry bilaterally, with wheezing,  no crackles. increased  respiratory effort. No accessory muscle use.  Cardiovascular: Regular rate and rhythm, no murmurs / rubs / gallops. No extremity edema. 2+ pedal pulses. No carotid bruits.  Abdomen: no tenderness, no masses palpated. No hepatosplenomegaly. Bowel sounds positive.  Musculoskeletal: no clubbing / cyanosis. No joint deformity upper and lower extremities. Good ROM, no contractures. Normal muscle tone.  Skin: no rashes, lesions, ulcers. No induration Neurologic: CN 2-12 grossly intact. Sensation intact, DTR normal. Strength 5/5 in all 4.  Psychiatric: Normal judgment and insight. Alert and oriented x 3. Normal mood.   Labs on  Admission: I have personally reviewed following labs and imaging studies  CBC: Recent Labs  Lab 05/13/18 1904  WBC 11.2*  NEUTROABS 9.0*  HGB 16.7  HCT 49.1  MCV 104.9*  PLT 335   Basic Metabolic Panel: Recent Labs  Lab 05/13/18 1904  NA 133*  K 3.5  CL 100  CO2 24  GLUCOSE 163*  BUN 14  CREATININE 0.79  CALCIUM 8.6*   GFR: Estimated Creatinine Clearance: 85 mL/min (by C-G formula based on SCr of 0.79 mg/dL). Liver Function Tests: Recent Labs  Lab 05/13/18 1904  AST 36  ALT 35  ALKPHOS 97  BILITOT 1.8*  PROT 8.5*  ALBUMIN 4.3   Cardiac Enzymes: Recent Labs  Lab 05/13/18 1904  TROPONINI 0.03*    Radiological Exams on Admission: Dg Chest Portable 1 View  Result Date: 05/13/2018 CLINICAL DATA:  Shortness of breath, lung cancer, smoker, emphysema EXAM: PORTABLE CHEST 1 VIEW COMPARISON:  03/05/2018 FINDINGS: Hyperinflation noted compatible with background COPD/emphysema. Left upper lobe nodularity and bandlike scarring noted, better demonstrated by comparison CT. Additional nodules by CT in the left lung are not well appreciated by plain radiography. No superimposed pneumonia, significant collapse or consolidation. Negative for edema, large effusion or pneumothorax. Trachea midline. Normal heart size and vascularity. Aorta atherosclerotic.  IMPRESSION: Hyperinflation compatible with COPD/emphysema Left upper lobe nodularity and adjacent scarring. No significant interval change or superimposed acute process by plain radiography. Electronically Signed   By: Jerilynn Mages.  Shick M.D.   On: 05/13/2018 19:40    EKG: Independently reviewed. Sinus rhythm.  QTC 455.  T wave changes aVL only.  Assessment/Plan Active Problems:   COPD exacerbation ([facility])   COPD exacerbation GOLD III-dyspnea, productive cough, reduced air entry and wheezing.  Afebrile.  WBC 11.2.  Stable chest-no acute abnormality, hyperinflation compatible with COPD, left upper lobe nodularity and scarring. -DuoNeb scheduled PRN -mucolytics, supplemental O2 -Azithromycin -Solu-Medrol 60 IV twice daily -Influenza panel - Xanax 0.5 X 1 for sleep  Elevated troponin- 0.03, likely 2/2 COPD exacerbation. Denies Chest pain.  EKG is unchanged.  Clinically appears more COPD exacerbation than Cardiac related. -Trend Troponin  X 2  CAD- with STEMI 11/2017 with inferior STEMI, mid RCA 100% lesion received thrombectomy and DES. -Currently on its aspirin and ticagrelor, continue while inpatient. Last dose this evening.  -Continue home low-dose bisoprolol, lisinopril Zetia. -Statins held for elevated liver enzymes  Lung Ca-left lower lobe, completed radiation therapy 05/2017, thought to be in remission, currently on surveillance.  But recent MRI 02/2018 showing liver lesions suspicious for malignancy.  Follows with Dr. Isidore Moos.  Alcohol history-ports 2-3 beers per week only when he goes out, the weather (Cold) has limited this.  Hepatitis C-patient reports he is on treatment with Glecaprevir-Pibrentasvir (MAVYRET)   DVT prophylaxis: Lovenox Code Status: Full Family Communication: Daughter at bedside Disposition Plan: 1-2 days Consults called: None Admission status: Obs, tele.   Bethena Roys MD [facility]  05/13/2018, 9:12 PM

## 2018-05-13 NOTE — ED Notes (Signed)
Date and time results received: 05/13/18 8:12 PM  (use smartphrase ".now" to insert current time)  Test: trop  Critical Value: 0.03 Name of Provider Notified: zammit   Orders Received? Or Actions Taken?: none at this time

## 2018-05-14 DIAGNOSIS — I252 Old myocardial infarction: Secondary | ICD-10-CM | POA: Diagnosis not present

## 2018-05-14 DIAGNOSIS — E785 Hyperlipidemia, unspecified: Secondary | ICD-10-CM | POA: Diagnosis present

## 2018-05-14 DIAGNOSIS — I2510 Atherosclerotic heart disease of native coronary artery without angina pectoris: Secondary | ICD-10-CM | POA: Diagnosis present

## 2018-05-14 DIAGNOSIS — B1920 Unspecified viral hepatitis C without hepatic coma: Secondary | ICD-10-CM | POA: Diagnosis present

## 2018-05-14 DIAGNOSIS — Z79899 Other long term (current) drug therapy: Secondary | ICD-10-CM | POA: Diagnosis not present

## 2018-05-14 DIAGNOSIS — C3432 Malignant neoplasm of lower lobe, left bronchus or lung: Secondary | ICD-10-CM | POA: Diagnosis present

## 2018-05-14 DIAGNOSIS — Z91030 Bee allergy status: Secondary | ICD-10-CM | POA: Diagnosis not present

## 2018-05-14 DIAGNOSIS — F17210 Nicotine dependence, cigarettes, uncomplicated: Secondary | ICD-10-CM | POA: Diagnosis present

## 2018-05-14 DIAGNOSIS — Z7982 Long term (current) use of aspirin: Secondary | ICD-10-CM | POA: Diagnosis not present

## 2018-05-14 DIAGNOSIS — Z85828 Personal history of other malignant neoplasm of skin: Secondary | ICD-10-CM | POA: Diagnosis not present

## 2018-05-14 DIAGNOSIS — J9621 Acute and chronic respiratory failure with hypoxia: Secondary | ICD-10-CM | POA: Diagnosis present

## 2018-05-14 DIAGNOSIS — I10 Essential (primary) hypertension: Secondary | ICD-10-CM | POA: Diagnosis present

## 2018-05-14 DIAGNOSIS — Z85118 Personal history of other malignant neoplasm of bronchus and lung: Secondary | ICD-10-CM | POA: Diagnosis not present

## 2018-05-14 DIAGNOSIS — Z923 Personal history of irradiation: Secondary | ICD-10-CM | POA: Diagnosis not present

## 2018-05-14 DIAGNOSIS — J09X2 Influenza due to identified novel influenza A virus with other respiratory manifestations: Secondary | ICD-10-CM | POA: Diagnosis present

## 2018-05-14 DIAGNOSIS — Z803 Family history of malignant neoplasm of breast: Secondary | ICD-10-CM | POA: Diagnosis not present

## 2018-05-14 DIAGNOSIS — J441 Chronic obstructive pulmonary disease with (acute) exacerbation: Secondary | ICD-10-CM | POA: Diagnosis present

## 2018-05-14 DIAGNOSIS — Z955 Presence of coronary angioplasty implant and graft: Secondary | ICD-10-CM | POA: Diagnosis not present

## 2018-05-14 DIAGNOSIS — F17290 Nicotine dependence, other tobacco product, uncomplicated: Secondary | ICD-10-CM | POA: Diagnosis present

## 2018-05-14 DIAGNOSIS — Z7951 Long term (current) use of inhaled steroids: Secondary | ICD-10-CM | POA: Diagnosis not present

## 2018-05-14 DIAGNOSIS — Z7902 Long term (current) use of antithrombotics/antiplatelets: Secondary | ICD-10-CM | POA: Diagnosis not present

## 2018-05-14 LAB — TROPONIN I
Troponin I: 0.04 ng/mL (ref <0.03)
Troponin I: 0.04 ng/mL (ref <0.03)

## 2018-05-14 MED ORDER — OSELTAMIVIR PHOSPHATE 75 MG PO CAPS
75.0000 mg | ORAL_CAPSULE | Freq: Two times a day (BID) | ORAL | Status: DC
  Administered 2018-05-14 – 2018-05-18 (×9): 75 mg via ORAL
  Filled 2018-05-14 (×9): qty 1

## 2018-05-14 MED ORDER — GUAIFENESIN-DM 100-10 MG/5ML PO SYRP
5.0000 mL | ORAL_SOLUTION | ORAL | Status: DC | PRN
  Administered 2018-05-14 – 2018-05-17 (×4): 5 mL via ORAL
  Filled 2018-05-14 (×4): qty 5

## 2018-05-14 MED ORDER — GLECAPREVIR-PIBRENTASVIR 100-40 MG PO TABS
3.0000 | ORAL_TABLET | Freq: Every day | ORAL | Status: DC
  Administered 2018-05-14 – 2018-05-18 (×5): 3 via ORAL
  Filled 2018-05-14 (×4): qty 3

## 2018-05-14 MED ORDER — GUAIFENESIN ER 600 MG PO TB12
1200.0000 mg | ORAL_TABLET | Freq: Two times a day (BID) | ORAL | Status: DC
  Administered 2018-05-14 – 2018-05-18 (×9): 1200 mg via ORAL
  Filled 2018-05-14 (×9): qty 2

## 2018-05-14 MED ORDER — ZOLPIDEM TARTRATE 5 MG PO TABS
5.0000 mg | ORAL_TABLET | Freq: Once | ORAL | Status: AC
  Administered 2018-05-14: 5 mg via ORAL
  Filled 2018-05-14: qty 1

## 2018-05-14 NOTE — Progress Notes (Signed)
Subjective: He was admitted with COPD exacerbation and influenza.  He is still coughing and congested.  He is still short of breath.  He is having trouble coughing anything up.  Objective: Vital signs in last 24 hours: Temp:  [97.9 F (36.6 C)-98.1 F (36.7 C)] 97.9 F (36.6 C) (01/30 0532) Pulse Rate:  [90-108] 99 (01/30 0532) Resp:  [16-29] 24 (01/30 0532) BP: (97-155)/(53-124) 128/64 (01/30 0532) SpO2:  [89 %-98 %] 94 % (01/30 0836) Weight:  [65 kg-68 kg] 65 kg (01/29 2319) Weight change:  Last BM Date: 05/13/18  Intake/Output from previous day: 01/29 0701 - 01/30 0700 In: 50.4 [IV Piggyback:50.4] Out: -   PHYSICAL EXAM General appearance: alert, mild distress and Looks acutely sick Resp: rhonchi bilaterally and wheezes bilaterally Cardio: regular rate and rhythm, S1, S2 normal, no murmur, click, rub or gallop GI: soft, non-tender; bowel sounds normal; no masses,  no organomegaly Extremities: extremities normal, atraumatic, no cyanosis or edema  Lab Results:  Results for orders placed or performed during the hospital encounter of 05/13/18 (from the past 48 hour(s))  CBC with Differential/Platelet     Status: Abnormal   Collection Time: 05/13/18  7:04 PM  Result Value Ref Range   WBC 11.2 (H) 4.0 - 10.5 K/uL   RBC 4.68 4.22 - 5.81 MIL/uL   Hemoglobin 16.7 13.0 - 17.0 g/dL   HCT 49.1 39.0 - 52.0 %   MCV 104.9 (H) 80.0 - 100.0 fL   MCH 35.7 (H) 26.0 - 34.0 pg   MCHC 34.0 30.0 - 36.0 g/dL   RDW 11.3 (L) 11.5 - 15.5 %   Platelets 290 150 - 400 K/uL   nRBC 0.0 0.0 - 0.2 %   Neutrophils Relative % 80 %   Neutro Abs 9.0 (H) 1.7 - 7.7 K/uL   Lymphocytes Relative 11 %   Lymphs Abs 1.2 0.7 - 4.0 K/uL   Monocytes Relative 7 %   Monocytes Absolute 0.8 0.1 - 1.0 K/uL   Eosinophils Relative 1 %   Eosinophils Absolute 0.1 0.0 - 0.5 K/uL   Basophils Relative 0 %   Basophils Absolute 0.1 0.0 - 0.1 K/uL   Immature Granulocytes 1 %   Abs Immature Granulocytes 0.06 0.00 - 0.07  K/uL    Comment: Performed at [facility], [address], [place], [place] [address]  Comprehensive metabolic panel     Status: Abnormal   Collection Time: 05/13/18  7:04 PM  Result Value Ref Range   Sodium 133 (L) 135 - 145 mmol/L   Potassium 3.5 3.5 - 5.1 mmol/L   Chloride 100 98 - 111 mmol/L   CO2 24 22 - 32 mmol/L   Glucose, Bld 163 (H) 70 - 99 mg/dL   BUN 14 8 - 23 mg/dL   Creatinine, Ser 0.79 0.61 - 1.24 mg/dL   Calcium 8.6 (L) 8.9 - 10.3 mg/dL   Total Protein 8.5 (H) 6.5 - 8.1 g/dL   Albumin 4.3 3.5 - 5.0 g/dL   AST 36 15 - 41 U/L   ALT 35 0 - 44 U/L   Alkaline Phosphatase 97 38 - 126 U/L   Total Bilirubin 1.8 (H) 0.3 - 1.2 mg/dL   GFR calc non Af Amer >60 >60 mL/min   GFR calc Af Amer >60 >60 mL/min   Anion gap 9 5 - 15    Comment: Performed at [facility], [address] [place], [place] [address]  Troponin I - ONCE - STAT  Status: Abnormal   Collection Time: 05/13/18  7:04 PM  Result Value Ref Range   Troponin I 0.03 (HH) <0.03 ng/mL    Comment: CRITICAL RESULT CALLED TO, READ BACK BY AND VERIFIED WITH: NICHOLS,K ON 05/13/18 AT 2005 BY LOY,C Performed at [facility], [address] [place], [place] [address]   I-stat troponin, ED     Status: None   Collection Time: 05/13/18  7:12 PM  Result Value Ref Range   Troponin i, poc 0.00 0.00 - 0.08 ng/mL   Comment 3            Comment: Due to the release kinetics of cTnI, a negative result within the first hours of the onset of symptoms does not rule out myocardial infarction with certainty. If myocardial infarction is still suspected, repeat the test at appropriate intervals.   Influenza panel by PCR (type A & B)     Status: Abnormal   Collection Time: 05/13/18  9:25 PM  Result Value Ref Range   Influenza A By PCR POSITIVE (A) NEGATIVE   Influenza B By PCR NEGATIVE NEGATIVE    Comment: (NOTE) The Xpert Xpress Flu assay is intended as an aid in the diagnosis of  influenza and should not be used as a sole basis  for treatment.  This  assay is FDA approved for nasopharyngeal swab specimens only. Nasal  washings and aspirates are unacceptable for Xpert Xpress Flu testing. Performed at [facility], [address], [place], [place] [address]   Troponin I - Now Then Q6H     Status: Abnormal   Collection Time: 05/14/18 12:46 AM  Result Value Ref Range   Troponin I 0.04 (HH) <0.03 ng/mL    Comment: CRITICAL VALUE NOTED.  VALUE IS CONSISTENT WITH PREVIOUSLY REPORTED AND CALLED VALUE. Performed at [facility], [address], [place], [place] [address]   Troponin I - Now Then Q6H     Status: Abnormal   Collection Time: 05/14/18  7:43 AM  Result Value Ref Range   Troponin I 0.04 (HH) <0.03 ng/mL    Comment: CRITICAL VALUE NOTED.  VALUE IS CONSISTENT WITH PREVIOUSLY REPORTED AND CALLED VALUE. Performed at [facility], [address], [place], [place] [address]     ABGS No results for input(s): PHART, PO2ART, TCO2, HCO3 in the last 72 hours.  Invalid input(s): PCO2 CULTURES No results found for this or any previous visit (from the past 240 hour(s)). 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 Left upper lobe nodularity and bandlike scarring noted, better demonstrated by comparison CT. Additional nodules by CT in the left lung are not well appreciated by plain radiography. No superimposed pneumonia, significant collapse or consolidation. Negative for edema, large effusion or pneumothorax. Trachea midline. Normal heart size and vascularity. Aorta atherosclerotic. IMPRESSION: Hyperinflation compatible with COPD/emphysema Left upper lobe nodularity and adjacent scarring. No significant interval change or superimposed acute process by plain radiography. Electronically Signed   By: Jerilynn Mages.  Shick M.D.    On: 05/13/2018 19:40    Medications:  Prior to Admission:  Medications Prior to Admission  Medication Sig Dispense Refill Last Dose  . albuterol (PROAIR HFA) 108 (90 Base) MCG/ACT inhaler Inhale 2 puffs into the lungs every 6 (six) hours as needed for wheezing.    05/13/2018 at 1400  . albuterol (PROVENTIL) 4 MG  tablet Take 4 mg by mouth 3 (three) times daily.   05/13/2018 at 1400  . aspirin EC 81 MG tablet Take 81 mg by mouth daily.   05/13/2018 at Unknown time  . bisoprolol (ZEBETA) 5 MG tablet Take 0.5 tablets (2.5 mg total) by mouth daily. 60 tablet 1 05/13/2018 at 900a  . ezetimibe (ZETIA) 10 MG tablet Take 1 tablet (10 mg total) by mouth daily. 90 tablet 1 05/13/2018 at Unknown time  . famotidine (PEPCID) 20 MG tablet Take 20 mg by mouth daily as needed for heartburn or indigestion.   05/13/2018 at Unknown time  . fluticasone (FLONASE) 50 MCG/ACT nasal spray Place 1 spray into both nostrils daily as needed for allergies.    unknown  . Glecaprevir-Pibrentasvir (MAVYRET) 100-40 MG TABS Take 3 tablets by mouth daily.    05/13/2018 at [place]  . lisinopril (PRINIVIL,ZESTRIL) 2.5 MG tablet Take 1 tablet (2.5 mg total) by mouth daily. 90 tablet 3 05/12/2018 at Unknown time  . Multiple Vitamins-Minerals (MULTIVITAMIN WITH MINERALS) tablet Take 1 tablet by mouth daily.   05/13/2018 at Unknown time  . nitroGLYCERIN (NITROSTAT) 0.4 MG SL tablet Place 1 tablet (0.4 mg total) under the tongue every 5 (five) minutes as needed. 25 tablet 3 unknown  . pantoprazole (PROTONIX) 40 MG tablet Take 40 mg by mouth daily.   5 05/12/2018 at Unknown time  . Potassium 99 MG TABS Take 1 tablet by mouth daily.    05/13/2018 at Unknown time  . Tetrahydrozoline HCl (VISINE OP) Place 1 drop into both eyes daily as needed (Dry Eyes).   unknown  . ticagrelor (BRILINTA) 90 MG TABS tablet Take 1 tablet (90 mg total) by mouth 2 (two) times daily. 180 tablet 2 05/13/2018 at 2222  . TRELEGY ELLIPTA 100-62.5-25 MCG/INH AEPB INHALE 1 PUFF  INTO THE LUNGS EVERY DAY (Patient taking differently: Inhale 1 puff into the lungs daily. ) 60 each 11 05/13/2018 at Unknown time  . vitamin E 400 UNIT capsule Take 1 tablet by mouth daily.    05/13/2018 at Unknown time  . Blood Pressure KIT 1 Units by Does not apply route daily. 1 each 0 Taking   Scheduled: . aspirin EC  81 mg Oral Daily  . azithromycin  500 mg Oral Daily  . budesonide (PULMICORT) nebulizer solution  0.25 mg Nebulization BID  . enoxaparin (LOVENOX) injection  40 mg Subcutaneous Q24H  . guaiFENesin  1,200 mg Oral BID  . ipratropium-albuterol  3 mL Nebulization Q6H  . methylPREDNISolone (SOLU-MEDROL) injection  60 mg Intravenous Q12H  . oseltamivir  75 mg Oral BID  . ticagrelor  90 mg Oral BID   Continuous:  [id number] **OR** acetaminophen, ipratropium-albuterol, ondansetron **OR** ondansetron (ZOFRAN) IV, polyethylene glycol  Assesment: He was admitted with COPD exacerbation.  He has influenza A which will be treated.  He is on appropriate treatment for COPD exacerbation but is still having trouble coughing anything up.  He has lung cancer which was treated with radiation and is perhaps in remission but there is some concern about some liver abnormalities. Principal Problem:   COPD exacerbation ([place]) Active Problems:   Hepatitis C    Plan: Add Tamiflu.  Continue other treatments.  Add flutter valve.    LOS: 0 days   Alonza Bogus 05/14/2018, 8:46 AM

## 2018-05-14 NOTE — Plan of Care (Signed)

## 2018-05-15 NOTE — Progress Notes (Signed)
Subjective: He was admitted with influenza A and COPD exacerbation.  He says he was able to sleep some last night and he feels better.  He has no new complaints.  He is still coughing.  He is still short of breath particularly when he exerts himself  Objective: Vital signs in last 24 hours: Temp:  [97.6 F (36.4 C)-98.2 F (36.8 C)] 98.2 F (36.8 C) (01/31 0458) Pulse Rate:  [80-82] 80 (01/31 0458) Resp:  [19] 19 (01/31 0458) BP: (94-95)/(46-62) 95/46 (01/31 0458) SpO2:  [96 %-98 %] 98 % (01/31 0755) Weight:  [62.7 kg] 62.7 kg (01/31 0458) Weight change: -5.34 kg Last BM Date: 05/15/18  Intake/Output from previous day: 01/30 0701 - 01/31 0700 In: 480 [P.O.:480] Out: 100 [Urine:100]  PHYSICAL EXAM General appearance: alert, cooperative and mild distress Resp: rhonchi bilaterally Cardio: regular rate and rhythm, S1, S2 normal, no murmur, click, rub or gallop GI: soft, non-tender; bowel sounds normal; no masses,  no organomegaly Extremities: extremities normal, atraumatic, no cyanosis or edema  Lab Results:  Results for orders placed or performed during the hospital encounter of 05/13/18 (from the past 48 hour(s))  CBC with Differential/Platelet     Status: Abnormal   Collection Time: 05/13/18  7:04 PM  Result Value Ref Range   WBC 11.2 (H) 4.0 - 10.5 K/uL   RBC 4.68 4.22 - 5.81 MIL/uL   Hemoglobin 16.7 13.0 - 17.0 g/dL   HCT 49.1 39.0 - 52.0 %   MCV 104.9 (H) 80.0 - 100.0 fL   MCH 35.7 (H) 26.0 - 34.0 pg   MCHC 34.0 30.0 - 36.0 g/dL   RDW 11.3 (L) 11.5 - 15.5 %   Platelets 290 150 - 400 K/uL   nRBC 0.0 0.0 - 0.2 %   Neutrophils Relative % 80 %   Neutro Abs 9.0 (H) 1.7 - 7.7 K/uL   Lymphocytes Relative 11 %   Lymphs Abs 1.2 0.7 - 4.0 K/uL   Monocytes Relative 7 %   Monocytes Absolute 0.8 0.1 - 1.0 K/uL   Eosinophils Relative 1 %   Eosinophils Absolute 0.1 0.0 - 0.5 K/uL   Basophils Relative 0 %   Basophils Absolute 0.1 0.0 - 0.1 K/uL   Immature Granulocytes 1 %    Abs Immature Granulocytes 0.06 0.00 - 0.07 K/uL    Comment: Performed at [facility], [address] [place], [place] [address]  Comprehensive metabolic panel     Status: Abnormal   Collection Time: 05/13/18  7:04 PM  Result Value Ref Range   Sodium 133 (L) 135 - 145 mmol/L   Potassium 3.5 3.5 - 5.1 mmol/L   Chloride 100 98 - 111 mmol/L   CO2 24 22 - 32 mmol/L   Glucose, Bld 163 (H) 70 - 99 mg/dL   BUN 14 8 - 23 mg/dL   Creatinine, Ser 0.79 0.61 - 1.24 mg/dL   Calcium 8.6 (L) 8.9 - 10.3 mg/dL   Total Protein 8.5 (H) 6.5 - 8.1 g/dL   Albumin 4.3 3.5 - 5.0 g/dL   AST 36 15 - 41 U/L   ALT 35 0 - 44 U/L   Alkaline Phosphatase 97 38 - 126 U/L   Total Bilirubin 1.8 (H) 0.3 - 1.2 mg/dL   GFR calc non Af Amer >60 >60 mL/min   GFR calc Af Amer >60 >60 mL/min   Anion gap 9 5 - 15    Comment: Performed at [facility], [address], [place], [place] [address]  Troponin I - ONCE - STAT     Status: Abnormal   Collection Time: 05/13/18  7:04 PM  Result Value Ref Range   Troponin I 0.03 (HH) <0.03 ng/mL    Comment: CRITICAL RESULT CALLED TO, READ BACK BY AND VERIFIED WITH: NICHOLS,K ON 05/13/18 AT 2005 BY LOY,C Performed at [facility], [address] [place], [place] [address]   I-stat troponin, ED     Status: None   Collection Time: 05/13/18  7:12 PM  Result Value Ref Range   Troponin i, poc 0.00 0.00 - 0.08 ng/mL   Comment 3            Comment: Due to the release kinetics of cTnI, a negative result within the first hours of the onset of symptoms does not rule out myocardial infarction with certainty. If myocardial infarction is still suspected, repeat the test at appropriate intervals.   Influenza panel by PCR (type A & B)     Status: Abnormal   Collection Time: 05/13/18  9:25 PM  Result Value Ref Range   Influenza A By PCR POSITIVE (A) NEGATIVE   Influenza B By PCR NEGATIVE NEGATIVE    Comment: (NOTE) The Xpert Xpress Flu assay is intended as an aid in the diagnosis of   influenza and should not be used as a sole basis for treatment.  This  assay is FDA approved for nasopharyngeal swab specimens only. Nasal  washings and aspirates are unacceptable for Xpert Xpress Flu testing. Performed at [facility], [address], [place], [place] [address]   Troponin I - Now Then Q6H     Status: Abnormal   Collection Time: 05/14/18 12:46 AM  Result Value Ref Range   Troponin I 0.04 (HH) <0.03 ng/mL    Comment: CRITICAL VALUE NOTED.  VALUE IS CONSISTENT WITH PREVIOUSLY REPORTED AND CALLED VALUE. Performed at [facility], [address], [place], [place] [address]   Troponin I - Now Then Q6H     Status: Abnormal   Collection Time: 05/14/18  7:43 AM  Result Value Ref Range   Troponin I 0.04 (HH) <0.03 ng/mL    Comment: CRITICAL VALUE NOTED.  VALUE IS CONSISTENT WITH PREVIOUSLY REPORTED AND CALLED VALUE. Performed at [facility], [address] [place], [place] [address]     ABGS No results for input(s): PHART, PO2ART, TCO2, HCO3 in the last 72 hours.  Invalid input(s): PCO2 CULTURES No results found for this or any previous visit (from the past 240 hour(s)). 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 Left upper lobe nodularity and bandlike scarring noted, better demonstrated by comparison CT. Additional nodules by CT in the left lung are not well appreciated by plain radiography. No superimposed pneumonia, significant collapse or consolidation. Negative for edema, large effusion or pneumothorax. Trachea midline. Normal heart size and vascularity. Aorta atherosclerotic. IMPRESSION: Hyperinflation compatible with COPD/emphysema Left upper lobe nodularity and adjacent scarring. No significant interval change or superimposed acute process by plain  radiography. Electronically Signed   By: Jerilynn Mages.  Shick M.D.   On: 05/13/2018 19:40    Medications:  Prior to Admission:  Medications Prior to Admission  Medication Sig Dispense Refill Last Dose  . albuterol (PROAIR HFA) 108 (90 Base) MCG/ACT inhaler Inhale 2 puffs into the lungs every 6 (six) hours as needed for wheezing.  05/13/2018 at 1400  . albuterol (PROVENTIL) 4 MG tablet Take 4 mg by mouth 3 (three) times daily.   05/14/2018 at 1400  . aspirin EC 81 MG tablet Take 81 mg by mouth daily. Take with brilanta   05/14/2018 at Unknown time  . bisoprolol (ZEBETA) 5 MG tablet Take 0.5 tablets (2.5 mg total) by mouth daily. 60 tablet 1 05/14/2018 at 900a  . ezetimibe (ZETIA) 10 MG tablet Take 1 tablet (10 mg total) by mouth daily. 90 tablet 1 05/14/2018 at Unknown time  . famotidine (PEPCID) 20 MG tablet Take 20 mg by mouth daily as needed for heartburn or indigestion.   05/13/2018 at Unknown time  . fluticasone (FLONASE) 50 MCG/ACT nasal spray Place 1 spray into both nostrils daily as needed for allergies.    unknown  . Glecaprevir-Pibrentasvir (MAVYRET) 100-40 MG TABS Take 3 tablets by mouth daily.    05/13/2018 at [place]  . lisinopril (PRINIVIL,ZESTRIL) 2.5 MG tablet Take 1 tablet (2.5 mg total) by mouth daily. 90 tablet 3 05/14/2018 at Unknown time  . Multiple Vitamins-Minerals (MULTIVITAMIN WITH MINERALS) tablet Take 1 tablet by mouth daily.   05/13/2018 at Unknown time  . nitroGLYCERIN (NITROSTAT) 0.4 MG SL tablet Place 1 tablet (0.4 mg total) under the tongue every 5 (five) minutes as needed. 25 tablet 3 unknown  . pantoprazole (PROTONIX) 40 MG tablet Take 40 mg by mouth daily.   5 05/14/2018 at Unknown time  . Potassium 99 MG TABS Take 1 tablet by mouth daily.    05/13/2018 at Unknown time  . Tetrahydrozoline HCl (VISINE OP) Place 1 drop into both eyes daily as needed (Dry Eyes).   unknown  . ticagrelor (BRILINTA) 90 MG TABS tablet Take 1 tablet (90 mg total) by mouth 2 (two) times daily. 180 tablet 2  05/14/2018 at 2222  . TRELEGY ELLIPTA 100-62.5-25 MCG/INH AEPB INHALE 1 PUFF INTO THE LUNGS EVERY DAY (Patient taking differently: Inhale 1 puff into the lungs daily. ) 60 each 11 05/14/2018 at Unknown time  . vitamin E 400 UNIT capsule Take 1 tablet by mouth daily.    05/13/2018 at Unknown time  . Blood Pressure KIT 1 Units by Does not apply route daily. 1 each 0 Taking   Scheduled: . aspirin EC  81 mg Oral Daily  . azithromycin  500 mg Oral Daily  . budesonide (PULMICORT) nebulizer solution  0.25 mg Nebulization BID  . enoxaparin (LOVENOX) injection  40 mg Subcutaneous Q24H  . Glecaprevir-Pibrentasvir  3 tablet Oral Daily  . guaiFENesin  1,200 mg Oral BID  . ipratropium-albuterol  3 mL Nebulization Q6H  . methylPREDNISolone (SOLU-MEDROL) injection  60 mg Intravenous Q12H  . oseltamivir  75 mg Oral BID  . ticagrelor  90 mg Oral BID   Continuous:  JOI:[id number] **OR** acetaminophen, guaiFENesin-dextromethorphan, ipratropium-albuterol, ondansetron **OR** ondansetron (ZOFRAN) IV, polyethylene glycol  Assesment: He was admitted with influenza A and COPD exacerbation.  He is improving.  He has history of lung cancer and has been treated and may be in remission but there was some question about some abnormalities seen in his liver and that is undergoing evaluation. Principal Problem:   COPD exacerbation ([place]) Active Problems:   Hepatitis C    Plan: Continue treatments.  He is not ready for discharge but he has improved    LOS: 1 day   Alonza Bogus 05/15/2018, 8:42 AM

## 2018-05-16 DIAGNOSIS — I2510 Atherosclerotic heart disease of native coronary artery without angina pectoris: Secondary | ICD-10-CM | POA: Diagnosis present

## 2018-05-16 NOTE — Progress Notes (Signed)
Subjective: He was admitted with COPD exacerbation and influenza A.  He was able to sleep.  He is still very short of breath when he gets up and moves.  Objective: Vital signs in last 24 hours: Temp:  [98.4 F (36.9 C)-98.6 F (37 C)] 98.6 F (37 C) (02/01 0800) Pulse Rate:  [64-91] 64 (02/01 0800) Resp:  [14-22] 14 (02/01 0800) BP: (97-102)/(46-59) 101/58 (02/01 0800) SpO2:  [95 %-99 %] 99 % (02/01 0800) Weight change:  Last BM Date: 05/15/18  Intake/Output from previous day: 01/31 0701 - 02/01 0700 In: 360 [P.O.:360] Out: -   PHYSICAL EXAM General appearance: alert, cooperative and no distress Resp: rhonchi bilaterally Cardio: regular rate and rhythm, S1, S2 normal, no murmur, click, rub or gallop GI: soft, non-tender; bowel sounds normal; no masses,  no organomegaly Extremities: extremities normal, atraumatic, no cyanosis or edema  Lab Results:  No results found for this or any previous visit (from the past 48 hour(s)).  ABGS No results for input(s): PHART, PO2ART, TCO2, HCO3 in the last 72 hours.  Invalid input(s): PCO2 CULTURES No results found for this or any previous visit (from the past 240 hour(s)). Studies/Results: No results found.  Medications:  Prior to Admission:  Medications Prior to Admission  Medication Sig Dispense Refill Last Dose  . albuterol (PROAIR HFA) 108 (90 Base) MCG/ACT inhaler Inhale 2 puffs into the lungs every 6 (six) hours as needed for wheezing.    05/13/2018 at 1400  . albuterol (PROVENTIL) 4 MG tablet Take 4 mg by mouth 3 (three) times daily.   05/14/2018 at 1400  . aspirin EC 81 MG tablet Take 81 mg by mouth daily. Take with brilanta   05/14/2018 at Unknown time  . bisoprolol (ZEBETA) 5 MG tablet Take 0.5 tablets (2.5 mg total) by mouth daily. 60 tablet 1 05/14/2018 at 900a  . ezetimibe (ZETIA) 10 MG tablet Take 1 tablet (10 mg total) by mouth daily. 90 tablet 1 05/14/2018 at Unknown time  . famotidine (PEPCID) 20 MG tablet Take 20 mg by  mouth daily as needed for heartburn or indigestion.   05/13/2018 at Unknown time  . fluticasone (FLONASE) 50 MCG/ACT nasal spray Place 1 spray into both nostrils daily as needed for allergies.    unknown  . Glecaprevir-Pibrentasvir (MAVYRET) 100-40 MG TABS Take 3 tablets by mouth daily.    05/13/2018 at [place]  . lisinopril (PRINIVIL,ZESTRIL) 2.5 MG tablet Take 1 tablet (2.5 mg total) by mouth daily. 90 tablet 3 05/14/2018 at Unknown time  . Multiple Vitamins-Minerals (MULTIVITAMIN WITH MINERALS) tablet Take 1 tablet by mouth daily.   05/13/2018 at Unknown time  . nitroGLYCERIN (NITROSTAT) 0.4 MG SL tablet Place 1 tablet (0.4 mg total) under the tongue every 5 (five) minutes as needed. 25 tablet 3 unknown  . pantoprazole (PROTONIX) 40 MG tablet Take 40 mg by mouth daily.   5 05/14/2018 at Unknown time  . Potassium 99 MG TABS Take 1 tablet by mouth daily.    05/13/2018 at Unknown time  . Tetrahydrozoline HCl (VISINE OP) Place 1 drop into both eyes daily as needed (Dry Eyes).   unknown  . ticagrelor (BRILINTA) 90 MG TABS tablet Take 1 tablet (90 mg total) by mouth 2 (two) times daily. 180 tablet 2 05/14/2018 at 2222  . TRELEGY ELLIPTA 100-62.5-25 MCG/INH AEPB INHALE 1 PUFF INTO THE LUNGS EVERY DAY (Patient taking differently: Inhale 1 puff into the lungs daily. ) 60 each 11 05/14/2018 at Unknown time  . vitamin  E 400 UNIT capsule Take 1 tablet by mouth daily.    05/13/2018 at Unknown time  . Blood Pressure KIT 1 Units by Does not apply route daily. 1 each 0 Taking   Scheduled: . aspirin EC  81 mg Oral Daily  . azithromycin  500 mg Oral Daily  . budesonide (PULMICORT) nebulizer solution  0.25 mg Nebulization BID  . enoxaparin (LOVENOX) injection  40 mg Subcutaneous Q24H  . Glecaprevir-Pibrentasvir  3 tablet Oral Daily  . guaiFENesin  1,200 mg Oral BID  . ipratropium-albuterol  3 mL Nebulization Q6H  . methylPREDNISolone (SOLU-MEDROL) injection  60 mg Intravenous Q12H  . oseltamivir  75 mg Oral BID  .  ticagrelor  90 mg Oral BID   Continuous:  [id number] **OR** acetaminophen, guaiFENesin-dextromethorphan, ipratropium-albuterol, ondansetron **OR** ondansetron (ZOFRAN) IV, polyethylene glycol  Assesment: He was admitted with COPD exacerbation.  He has influenza A.  He has lung cancer and has been treated but there is concerned that he may have liver metastatic disease and that is being worked up as an outpatient.  He has hepatitis C at baseline. Principal Problem:   COPD exacerbation ([place]) Active Problems:   Hepatitis C    Plan: Continue current treatments he is not ready for discharge yet but he is improving    LOS: 2 days   Alonza Bogus 05/16/2018, 9:19 AM

## 2018-05-17 DIAGNOSIS — J09X2 Influenza due to identified novel influenza A virus with other respiratory manifestations: Secondary | ICD-10-CM | POA: Diagnosis present

## 2018-05-17 MED ORDER — PREDNISONE 20 MG PO TABS
40.0000 mg | ORAL_TABLET | Freq: Every day | ORAL | Status: DC
  Administered 2018-05-17 – 2018-05-18 (×2): 40 mg via ORAL
  Filled 2018-05-17 (×2): qty 2

## 2018-05-17 MED ORDER — ORAL CARE MOUTH RINSE
15.0000 mL | Freq: Two times a day (BID) | OROMUCOSAL | Status: DC
  Administered 2018-05-18: 15 mL via OROMUCOSAL

## 2018-05-17 NOTE — Progress Notes (Signed)
Subjective: He is still short of breath with exertion.  He is still coughing nonproductively.  He has no other new complaints.  Objective: Vital signs in last 24 hours: Temp:  [97.5 F (36.4 C)-98.6 F (37 C)] 97.5 F (36.4 C) (02/02 0535) Pulse Rate:  [62-79] 62 (02/02 0535) Resp:  [17-18] 18 (02/02 0535) BP: (112-139)/(57-70) 119/57 (02/02 0535) SpO2:  [96 %-98 %] 96 % (02/02 0535) Weight change:  Last BM Date: 05/16/18  Intake/Output from previous day: 02/01 0701 - 02/02 0700 In: 480 [P.O.:480] Out: -   PHYSICAL EXAM General appearance: alert, cooperative and mild distress Resp: rhonchi bilaterally Cardio: regular rate and rhythm, S1, S2 normal, no murmur, click, rub or gallop GI: soft, non-tender; bowel sounds normal; no masses,  no organomegaly Extremities: extremities normal, atraumatic, no cyanosis or edema  Lab Results:  No results found for this or any previous visit (from the past 48 hour(s)).  ABGS No results for input(s): PHART, PO2ART, TCO2, HCO3 in the last 72 hours.  Invalid input(s): PCO2 CULTURES No results found for this or any previous visit (from the past 240 hour(s)). Studies/Results: No results found.  Medications:  Prior to Admission:  Medications Prior to Admission  Medication Sig Dispense Refill Last Dose  . albuterol (PROAIR HFA) 108 (90 Base) MCG/ACT inhaler Inhale 2 puffs into the lungs every 6 (six) hours as needed for wheezing.    05/13/2018 at 1400  . albuterol (PROVENTIL) 4 MG tablet Take 4 mg by mouth 3 (three) times daily.   05/14/2018 at 1400  . aspirin EC 81 MG tablet Take 81 mg by mouth daily. Take with brilanta   05/14/2018 at Unknown time  . bisoprolol (ZEBETA) 5 MG tablet Take 0.5 tablets (2.5 mg total) by mouth daily. 60 tablet 1 05/14/2018 at 900a  . ezetimibe (ZETIA) 10 MG tablet Take 1 tablet (10 mg total) by mouth daily. 90 tablet 1 05/14/2018 at Unknown time  . famotidine (PEPCID) 20 MG tablet Take 20 mg by mouth daily as  needed for heartburn or indigestion.   05/13/2018 at Unknown time  . fluticasone (FLONASE) 50 MCG/ACT nasal spray Place 1 spray into both nostrils daily as needed for allergies.    unknown  . Glecaprevir-Pibrentasvir (MAVYRET) 100-40 MG TABS Take 3 tablets by mouth daily.    05/13/2018 at [place]  . lisinopril (PRINIVIL,ZESTRIL) 2.5 MG tablet Take 1 tablet (2.5 mg total) by mouth daily. 90 tablet 3 05/14/2018 at Unknown time  . Multiple Vitamins-Minerals (MULTIVITAMIN WITH MINERALS) tablet Take 1 tablet by mouth daily.   05/13/2018 at Unknown time  . nitroGLYCERIN (NITROSTAT) 0.4 MG SL tablet Place 1 tablet (0.4 mg total) under the tongue every 5 (five) minutes as needed. 25 tablet 3 unknown  . pantoprazole (PROTONIX) 40 MG tablet Take 40 mg by mouth daily.   5 05/14/2018 at Unknown time  . Potassium 99 MG TABS Take 1 tablet by mouth daily.    05/13/2018 at Unknown time  . Tetrahydrozoline HCl (VISINE OP) Place 1 drop into both eyes daily as needed (Dry Eyes).   unknown  . ticagrelor (BRILINTA) 90 MG TABS tablet Take 1 tablet (90 mg total) by mouth 2 (two) times daily. 180 tablet 2 05/14/2018 at 2222  . TRELEGY ELLIPTA 100-62.5-25 MCG/INH AEPB INHALE 1 PUFF INTO THE LUNGS EVERY DAY (Patient taking differently: Inhale 1 puff into the lungs daily. ) 60 each 11 05/14/2018 at Unknown time  . vitamin E 400 UNIT capsule Take 1 tablet by  mouth daily.    05/13/2018 at Unknown time  . Blood Pressure KIT 1 Units by Does not apply route daily. 1 each 0 Taking   Scheduled: . aspirin EC  81 mg Oral Daily  . azithromycin  500 mg Oral Daily  . budesonide (PULMICORT) nebulizer solution  0.25 mg Nebulization BID  . enoxaparin (LOVENOX) injection  40 mg Subcutaneous Q24H  . Glecaprevir-Pibrentasvir  3 tablet Oral Daily  . guaiFENesin  1,200 mg Oral BID  . ipratropium-albuterol  3 mL Nebulization Q6H  . methylPREDNISolone (SOLU-MEDROL) injection  60 mg Intravenous Q12H  . oseltamivir  75 mg Oral BID  . ticagrelor  90 mg  Oral BID   Continuous:  VEL:[id number] **OR** acetaminophen, guaiFENesin-dextromethorphan, ipratropium-albuterol, ondansetron **OR** ondansetron (ZOFRAN) IV, polyethylene glycol  Assesment: He was admitted with influenza A creating COPD exacerbation.  He has pretty severe COPD at baseline.  He is improving.  He has left lower lobe cancer of the lung which is been treated and he may be in remission but there was some concern about some liver lesions and that is being worked up.  He has coronary disease with fairly recent MI but no chest pain  He has acute on chronic hypoxic respiratory failure Principal Problem:   COPD exacerbation ([place]) Active Problems:   Primary cancer of left lower lobe of lung (HCC)   Hepatitis C   Coronary artery disease, occlusive    Plan: Continue treatments.  Switch him to p.o.  I think he can probably be discharged tomorrow    LOS: 3 days   Alonza Bogus 05/17/2018, 10:10 AM

## 2018-05-18 MED ORDER — IPRATROPIUM-ALBUTEROL 0.5-2.5 (3) MG/3ML IN SOLN
3.0000 mL | Freq: Four times a day (QID) | RESPIRATORY_TRACT | Status: DC
  Administered 2018-05-18 (×2): 3 mL via RESPIRATORY_TRACT
  Filled 2018-05-18 (×2): qty 3

## 2018-05-18 MED ORDER — ALUM & MAG HYDROXIDE-SIMETH 200-200-20 MG/5ML PO SUSP
30.0000 mL | Freq: Once | ORAL | Status: AC
  Administered 2018-05-18: 30 mL via ORAL
  Filled 2018-05-18: qty 30

## 2018-05-18 MED ORDER — PREDNISONE 10 MG (21) PO TBPK: ORAL_TABLET | ORAL | 0 refills | Status: DC

## 2018-05-18 MED ORDER — AZITHROMYCIN 250 MG PO TABS: ORAL_TABLET | ORAL | 0 refills | Status: DC

## 2018-05-18 NOTE — Discharge Summary (Signed)
Physician Discharge Summary  Patient ID: Adam Bass MRN: [id number] DOB/AGE: 1950/12/14 68 y.o. Primary Care Physician:Muaaz Brau, Percell Miller, MD Admit date: 05/13/2018 Discharge date: 05/18/2018    Discharge Diagnoses:   Principal Problem:   COPD exacerbation [facility]) Active Problems:   Primary cancer of left lower lobe of lung ([place])   Hepatitis C   Coronary artery disease, occlusive   Influenza due to identified novel influenza A virus with other respiratory manifestations Acute hypoxic respiratory failure Hypertension Hyperlipidemia  Allergies as of 05/18/2018      Reactions   Bee Venom Hives      Medication List    TAKE these medications   aspirin EC 81 MG tablet Take 81 mg by mouth daily. Take with brilanta   azithromycin 250 MG tablet Commonly known as:  ZITHROMAX Z-PAK Take by package instructions   bisoprolol 5 MG tablet Commonly known as:  ZEBETA Take 0.5 tablets (2.5 mg total) by mouth daily.   Blood Pressure Kit 1 Units by Does not apply route daily.   ezetimibe 10 MG tablet Commonly known as:  ZETIA Take 1 tablet (10 mg total) by mouth daily.   famotidine 20 MG tablet Commonly known as:  PEPCID Take 20 mg by mouth daily as needed for heartburn or indigestion.   fluticasone 50 MCG/ACT nasal spray Commonly known as:  FLONASE Place 1 spray into both nostrils daily as needed for allergies.   lisinopril 2.5 MG tablet Commonly known as:  PRINIVIL,ZESTRIL Take 1 tablet (2.5 mg total) by mouth daily.   MAVYRET 100-40 MG Tabs Generic drug:  Glecaprevir-Pibrentasvir Take 3 tablets by mouth daily.   multivitamin with minerals tablet Take 1 tablet by mouth daily.   nitroGLYCERIN 0.4 MG SL tablet Commonly known as:  NITROSTAT Place 1 tablet (0.4 mg total) under the tongue every 5 (five) minutes as needed.   pantoprazole 40 MG tablet Commonly known as:  PROTONIX Take 40 mg by mouth daily.   Potassium 99 MG Tabs Take 1 tablet by mouth daily.    predniSONE 10 MG (21) Tbpk tablet Commonly known as:  STERAPRED UNI-PAK 21 TAB Take by package instructions   albuterol 4 MG tablet Commonly known as:  PROVENTIL Take 4 mg by mouth 3 (three) times daily.   PROAIR HFA 108 (90 Base) MCG/ACT inhaler Generic drug:  albuterol Inhale 2 puffs into the lungs every 6 (six) hours as needed for wheezing.   ticagrelor 90 MG Tabs tablet Commonly known as:  BRILINTA Take 1 tablet (90 mg total) by mouth 2 (two) times daily.   TRELEGY ELLIPTA 100-62.5-25 MCG/INH Aepb Generic drug:  Fluticasone-Umeclidin-Vilant INHALE 1 PUFF INTO THE LUNGS EVERY DAY What changed:  See the new instructions.   VISINE OP Place 1 drop into both eyes daily as needed (Dry Eyes).   vitamin E 400 UNIT capsule Take 1 tablet by mouth daily.            Durable Medical Equipment  (From admission, onward)         Start     Ordered   05/18/18 0000  For home use only DME oxygen    Question Answer Comment  Mode or (Route) Nasal cannula   Liters per Minute 4   Frequency Continuous (stationary and portable oxygen unit needed)   Oxygen conserving device No   Oxygen delivery system Gas      05/18/18 0822          Discharged Condition: Improved    Consults: None  Significant Diagnostic Studies: Dg Chest Portable 1 View  Result Date: 05/13/2018 CLINICAL DATA:  Shortness of breath, lung cancer, smoker, emphysema EXAM: PORTABLE CHEST 1 VIEW COMPARISON:  03/05/2018 FINDINGS: Hyperinflation noted compatible with background COPD/emphysema. Left upper lobe nodularity and bandlike scarring noted, better demonstrated by comparison CT. Additional nodules by CT in the left lung are not well appreciated by plain radiography. No superimposed pneumonia, significant collapse or consolidation. Negative for edema, large effusion or pneumothorax. Trachea midline. Normal heart size and vascularity. Aorta atherosclerotic. IMPRESSION: Hyperinflation compatible with  COPD/emphysema Left upper lobe nodularity and adjacent scarring. No significant interval change or superimposed acute process by plain radiography. Electronically Signed   By: Jerilynn Mages.  Shick M.D.   On: 05/13/2018 19:40    Lab Results: Basic Metabolic Panel: No results for input(s): NA, K, CL, CO2, GLUCOSE, BUN, CREATININE, CALCIUM, MG, PHOS in the last 72 hours. Liver Function Tests: No results for input(s): AST, ALT, ALKPHOS, BILITOT, PROT, ALBUMIN in the last 72 hours.   CBC: No results for input(s): WBC, NEUTROABS, HGB, HCT, MCV, PLT in the last 72 hours.  No results found for this or any previous visit (from the past 240 hour(s)).   Hospital Course: This is a 68 year old with a long known history of COPD.  He had been in his usual state of poor health at home when he developed increasing cough congestion fever.  He came to the emergency department was noted to be having COPD exacerbation and was positive for influenza A.  He was admitted started on treatment for COPD exacerbation and for influenza.  He has finished treatment for influenza.  He is markedly improved and is able to ambulate in the halls and in his room although he does get short of breath.   he remained hypoxic and he is going to need home oxygen I think.  He is much improved.  He is scheduled for biopsy of a liver lesion in about 10 days and I think he will be okay to do that.  Discharge Exam: Blood pressure 131/72, pulse 71, temperature 98.3 F (36.8 C), temperature source Oral, resp. rate (!) 21, height 5' 9"  (1.753 m), weight 62.7 kg, SpO2 97 %. He is awake and alert.  Chest is clear.  Heart is regular.  Abdomen soft  Disposition: Home will send him home with oxygen  Discharge Instructions    For home use only DME oxygen   Complete by:  As directed    Mode or (Route):  Nasal cannula   Liters per Minute:  4   Frequency:  Continuous (stationary and portable oxygen unit needed)   Oxygen conserving device:  No   Oxygen  delivery system:  Gas        Signed: Alonza Bogus   05/18/2018, 8:23 AM

## 2018-05-18 NOTE — Progress Notes (Signed)
Subjective: He says he feels better.  He is still on oxygen.  He does not have oxygen at home.    Objective: Vital signs in last 24 hours: Temp:  [98.2 F (36.8 C)-98.5 F (36.9 C)] 98.3 F (36.8 C) (02/03 0635) Pulse Rate:  [61-78] 71 (02/03 0635) Resp:  [18-21] 21 (02/03 0635) BP: (122-132)/(65-72) 131/72 (02/03 0635) SpO2:  [92 %-98 %] 97 % (02/03 3151) Weight change:  Last BM Date: 05/17/18  Intake/Output from previous day: 02/02 0701 - 02/03 0700 In: 1210 [P.O.:1210] Out: -   PHYSICAL EXAM General appearance: alert, cooperative and no distress Resp: rhonchi bilaterally Cardio: regular rate and rhythm, S1, S2 normal, no murmur, click, rub or gallop GI: soft, non-tender; bowel sounds normal; no masses,  no organomegaly Extremities: extremities normal, atraumatic, no cyanosis or edema  Lab Results:  No results found for this or any previous visit (from the past 48 hour(s)).  ABGS No results for input(s): PHART, PO2ART, TCO2, HCO3 in the last 72 hours.  Invalid input(s): PCO2 CULTURES No results found for this or any previous visit (from the past 240 hour(s)). Studies/Results: No results found.  Medications:  Prior to Admission:  Medications Prior to Admission  Medication Sig Dispense Refill Last Dose  . albuterol (PROAIR HFA) 108 (90 Base) MCG/ACT inhaler Inhale 2 puffs into the lungs every 6 (six) hours as needed for wheezing.    05/13/2018 at 1400  . albuterol (PROVENTIL) 4 MG tablet Take 4 mg by mouth 3 (three) times daily.   05/14/2018 at 1400  . aspirin EC 81 MG tablet Take 81 mg by mouth daily. Take with brilanta   05/14/2018 at Unknown time  . bisoprolol (ZEBETA) 5 MG tablet Take 0.5 tablets (2.5 mg total) by mouth daily. 60 tablet 1 05/14/2018 at 900a  . ezetimibe (ZETIA) 10 MG tablet Take 1 tablet (10 mg total) by mouth daily. 90 tablet 1 05/14/2018 at Unknown time  . famotidine (PEPCID) 20 MG tablet Take 20 mg by mouth daily as needed for heartburn or  indigestion.   05/13/2018 at Unknown time  . fluticasone (FLONASE) 50 MCG/ACT nasal spray Place 1 spray into both nostrils daily as needed for allergies.    unknown  . Glecaprevir-Pibrentasvir (MAVYRET) 100-40 MG TABS Take 3 tablets by mouth daily.    05/13/2018 at [place]  . lisinopril (PRINIVIL,ZESTRIL) 2.5 MG tablet Take 1 tablet (2.5 mg total) by mouth daily. 90 tablet 3 05/14/2018 at Unknown time  . Multiple Vitamins-Minerals (MULTIVITAMIN WITH MINERALS) tablet Take 1 tablet by mouth daily.   05/13/2018 at Unknown time  . nitroGLYCERIN (NITROSTAT) 0.4 MG SL tablet Place 1 tablet (0.4 mg total) under the tongue every 5 (five) minutes as needed. 25 tablet 3 unknown  . pantoprazole (PROTONIX) 40 MG tablet Take 40 mg by mouth daily.   5 05/14/2018 at Unknown time  . Potassium 99 MG TABS Take 1 tablet by mouth daily.    05/13/2018 at Unknown time  . Tetrahydrozoline HCl (VISINE OP) Place 1 drop into both eyes daily as needed (Dry Eyes).   unknown  . ticagrelor (BRILINTA) 90 MG TABS tablet Take 1 tablet (90 mg total) by mouth 2 (two) times daily. 180 tablet 2 05/14/2018 at 2222  . TRELEGY ELLIPTA 100-62.5-25 MCG/INH AEPB INHALE 1 PUFF INTO THE LUNGS EVERY DAY (Patient taking differently: Inhale 1 puff into the lungs daily. ) 60 each 11 05/14/2018 at Unknown time  . vitamin E 400 UNIT capsule Take 1 tablet by  mouth daily.    05/13/2018 at Unknown time  . Blood Pressure KIT 1 Units by Does not apply route daily. 1 each 0 Taking   Scheduled: . aspirin EC  81 mg Oral Daily  . azithromycin  500 mg Oral Daily  . budesonide (PULMICORT) nebulizer solution  0.25 mg Nebulization BID  . enoxaparin (LOVENOX) injection  40 mg Subcutaneous Q24H  . Glecaprevir-Pibrentasvir  3 tablet Oral Daily  . guaiFENesin  1,200 mg Oral BID  . ipratropium-albuterol  3 mL Nebulization Q6H WA  . mouth rinse  15 mL Mouth Rinse BID  . oseltamivir  75 mg Oral BID  . predniSONE  40 mg Oral Q breakfast  . ticagrelor  90 mg Oral BID    Continuous:  FMB:[id number] **OR** acetaminophen, guaiFENesin-dextromethorphan, ipratropium-albuterol, ondansetron **OR** ondansetron (ZOFRAN) IV, polyethylene glycol  Assesment: He was admitted with influenza A.  This created COPD exacerbation.  He is better.  He has acute hypoxic respiratory failure.  He does not have oxygen at home and I think he will need at least temporary oxygen at home now.  He has left lower lobe lung cancer that is been treated but he has abnormalities in his liver and he is set up for biopsy in about 10 days.  He has had fairly recent myocardial infarction but he is not having any chest pain.  He has improved and is at maximum hospital benefit and will be discharged home today Principal Problem:   COPD exacerbation ([place]) Active Problems:   Primary cancer of left lower lobe of lung (HCC)   Hepatitis C   Coronary artery disease, occlusive   Influenza due to identified novel influenza A virus with other respiratory manifestations    Plan: Discharge home today.  I think he is going to need oxygen    LOS: 4 days   Adam Bass 05/18/2018, 8:05 AM

## 2018-05-18 NOTE — Progress Notes (Signed)
SATURATION QUALIFICATIONS: (This note is used to comply with regulatory documentation for home oxygen)  Patient Saturations on Room Air at Rest = 83%  Please briefly explain why patient needs home oxygen: 98% on 4 liters of oxygen via nasal cannula at rest

## 2018-05-18 NOTE — Progress Notes (Signed)
Patient requested "something for heartburn please." notified Dr. Luan Pulling, order received for maalox 30 ml po x 1 dose. Given as ordered. Donavan Foil, RN

## 2018-05-18 NOTE — Progress Notes (Signed)
Discharge instructions reviewed with patient. Given AVS. Prescriptions sent to his pharmacy by MD and patient aware to pick them up. Verbalized understanding of instructions and follow-up with PCP and cardiology. States he will call to schedule appoitnments. Home O2 being arranged by CM and to be delivered prior to discharge. Pt in stable condition awaiting home O2 and his daughter's arrival this evening. Donavan Foil, RN

## 2018-05-18 NOTE — Progress Notes (Signed)
Patent is asleep and appears well.

## 2018-05-18 NOTE — Clinical Social Work Note (Addendum)
Pt is a 67 year old male being seen for Case Management needs. Pt has been discharged by MD. Pt needs Home O2 arranged. Met with pt at bedside today. Pt reports that he lives alone in his 2nd story apartment. Per pt, there is no elevator. Pt states that he is normally independent in ADLs at home. He drives and he states that he has been on disability leave from his company since July 2019. Discussed home O2 provider options with pt who requests [facility]. Pt states it has become more difficult for him to manage some of his daily needs due to his breathing. Pt states that his daughter and some friends have been helping him. Pt denies any other CM needs for dc. 

## 2018-05-19 DIAGNOSIS — J441 Chronic obstructive pulmonary disease with (acute) exacerbation: Secondary | ICD-10-CM | POA: Diagnosis not present

## 2018-05-20 DIAGNOSIS — J441 Chronic obstructive pulmonary disease with (acute) exacerbation: Secondary | ICD-10-CM | POA: Diagnosis not present

## 2018-05-21 NOTE — Patient Instructions (Signed)
Adam Bass  05/21/2018   Your procedure is scheduled on: 05-27-2018   Report to [facility] Main  Entrance    Report to admitting at 6:30AM    Call this number if you have problems the morning of surgery [phone]     Remember: Do not eat food or drink liquids :After Midnight. BRUSH YOUR TEETH MORNING OF SURGERY AND RINSE YOUR MOUTH OUT, NO CHEWING GUM CANDY OR MINTS.     Take these medicines the morning of surgery with A SIP OF WATER: MAVYRET, bisoprolol, ezetimibe, Protonix, Proventil, Albuterol inhaler                                You may not have any metal on your body including hair pins and              piercings  Do not wear jewelry, make-up, lotions, powders or perfumes, deodorant                      Men may shave face and neck.   Do not bring valuables to the hospital. [place].  Contacts, dentures or bridgework may not be worn into surgery.      Patients discharged the day of surgery will not be allowed to drive home. IF YOU ARE HAVING SURGERY AND GOING HOME THE SAME DAY, YOU MUST HAVE AN ADULT TO DRIVE YOU HOME AND BE WITH YOU FOR 24 HOURS. YOU MAY GO HOME BY TAXI OR UBER OR ORTHERWISE, BUT AN ADULT MUST ACCOMPANY YOU HOME AND STAY WITH YOU FOR 24 HOURS.  Name and phone number of your driver:  Special Instructions: N/A              Please read over the following fact sheets you were given: _____________________________________________________________________             [facility] - Preparing for Surgery Before surgery, you can play an important role.  Because skin is not sterile, your skin needs to be as free of germs as possible.  You can reduce the number of germs on your skin by washing with CHG (chlorahexidine gluconate) soap before surgery.  CHG is an antiseptic cleaner which kills germs and bonds with the skin to continue killing germs even after washing. Please DO NOT use  if you have an allergy to CHG or antibacterial soaps.  If your skin becomes reddened/irritated stop using the CHG and inform your nurse when you arrive at Short Stay. Do not shave (including legs and underarms) for at least 48 hours prior to the first CHG shower.  You may shave your face/neck. Please follow these instructions carefully:  1.  Shower with CHG Soap the night before surgery and the  morning of Surgery.  2.  If you choose to wash your hair, wash your hair first as usual with your  normal  shampoo.  3.  After you shampoo, rinse your hair and body thoroughly to remove the  shampoo.                           4.  Use CHG as you would any other liquid soap.  You can  apply chg directly  to the skin and wash                       Gently with a scrungie or clean washcloth.  5.  Apply the CHG Soap to your body ONLY FROM THE NECK DOWN.   Do not use on face/ open                           Wound or open sores. Avoid contact with eyes, ears mouth and genitals (private parts).                       Wash face,  Genitals (private parts) with your normal soap.             6.  Wash thoroughly, paying special attention to the area where your surgery  will be performed.  7.  Thoroughly rinse your body with warm water from the neck down.  8.  DO NOT shower/wash with your normal soap after using and rinsing off  the CHG Soap.                9.  Pat yourself dry with a clean towel.            10.  Wear clean pajamas.            11.  Place clean sheets on your bed the night of your first shower and do not  sleep with pets. Day of Surgery : Do not apply any lotions/deodorants the morning of surgery.  Please wear clean clothes to the hospital/surgery center.  FAILURE TO FOLLOW THESE INSTRUCTIONS MAY RESULT IN THE CANCELLATION OF YOUR SURGERY PATIENT SIGNATURE_________________________________  NURSE  SIGNATURE__________________________________  ________________________________________________________________________

## 2018-05-21 NOTE — Progress Notes (Signed)
Cardiac clearance (see tele notes epic)  lov cards 04-20-2018 epic lov pulm 08-07-17 epic   EKG 05-13-2018 epic  Echo 11-24-2017 epic  CXR 05-13-2018

## 2018-05-22 ENCOUNTER — Ambulatory Visit (HOSPITAL_COMMUNITY): Payer: BLUE CROSS/BLUE SHIELD | Admitting: Registered Nurse

## 2018-05-22 ENCOUNTER — Other Ambulatory Visit: Payer: Self-pay

## 2018-05-22 ENCOUNTER — Encounter (HOSPITAL_COMMUNITY)
Admission: RE | Admit: 2018-05-22 | Discharge: 2018-05-22 | Disposition: A | Discharge status: Home / Self Care | Payer: BLUE CROSS/BLUE SHIELD | Source: Ambulatory Visit | Attending: Interventional Radiology | Admitting: Interventional Radiology

## 2018-05-22 ENCOUNTER — Ambulatory Visit (HOSPITAL_COMMUNITY): Payer: BLUE CROSS/BLUE SHIELD | Admitting: Physician Assistant

## 2018-05-22 ENCOUNTER — Encounter (HOSPITAL_COMMUNITY): Payer: Self-pay

## 2018-05-22 DIAGNOSIS — Z01812 Encounter for preprocedural laboratory examination: Secondary | ICD-10-CM | POA: Diagnosis not present

## 2018-05-22 HISTORY — DX: Hyperlipidemia, unspecified: E78.5

## 2018-05-22 HISTORY — DX: Localized edema: R60.0

## 2018-05-22 HISTORY — DX: Essential (primary) hypertension: I10

## 2018-05-22 HISTORY — DX: Atherosclerotic heart disease of native coronary artery without angina pectoris: I25.10

## 2018-05-22 HISTORY — DX: Unspecified viral hepatitis C without hepatic coma: B19.20

## 2018-05-22 LAB — CBC
HCT: 37.9 % — ABNORMAL LOW (ref 39.0–52.0)
Hemoglobin: 13.1 g/dL (ref 13.0–17.0)
MCH: 37.2 pg — ABNORMAL HIGH (ref 26.0–34.0)
MCHC: 34.6 g/dL (ref 30.0–36.0)
MCV: 107.7 fL — ABNORMAL HIGH (ref 80.0–100.0)
NRBC: 0 % (ref 0.0–0.2)
Platelets: 289 10*3/uL (ref 150–400)
RBC: 3.52 MIL/uL — ABNORMAL LOW (ref 4.22–5.81)
RDW: 10.9 % — ABNORMAL LOW (ref 11.5–15.5)
WBC: 19.9 10*3/uL — AB (ref 4.0–10.5)

## 2018-05-22 LAB — COMPREHENSIVE METABOLIC PANEL
ALT: 46 U/L — ABNORMAL HIGH (ref 0–44)
AST: 32 U/L (ref 15–41)
Albumin: 3.6 g/dL (ref 3.5–5.0)
Alkaline Phosphatase: 60 U/L (ref 38–126)
Anion gap: 7 (ref 5–15)
BUN: 18 mg/dL (ref 8–23)
CO2: 29 mmol/L (ref 22–32)
Calcium: 8.9 mg/dL (ref 8.9–10.3)
Chloride: 98 mmol/L (ref 98–111)
Creatinine, Ser: 0.64 mg/dL (ref 0.61–1.24)
GFR calc Af Amer: >60 mL/min (ref >60)
GFR calc non Af Amer: >60 mL/min (ref >60)
Glucose, Bld: 116 mg/dL — ABNORMAL HIGH (ref 70–99)
POTASSIUM: 4.7 mmol/L (ref 3.5–5.1)
Sodium: 134 mmol/L — ABNORMAL LOW (ref 135–145)
Total Bilirubin: 1.4 mg/dL — ABNORMAL HIGH (ref 0.3–1.2)
Total Protein: 6.5 g/dL (ref 6.5–8.1)

## 2018-05-22 NOTE — Progress Notes (Signed)
Patient recently dc'd from hospital for influenza , dc date 05-18-2018. dc'd with 4L home o2 , reports uses prn when exerting himself. Reports feels much improved today. O2sat monitored continuously with dinamap during pre-op appt. Patient maintained 92% on RA. RN made [place] PA aware .

## 2018-05-25 ENCOUNTER — Other Ambulatory Visit: Payer: Self-pay | Admitting: Radiology

## 2018-05-25 NOTE — Anesthesia Preprocedure Evaluation (Addendum)
Anesthesia Evaluation  Patient identified by MRN, date of birth, ID band Patient awake    Reviewed: Allergy & Precautions, NPO status , Patient's Chart, lab work & pertinent test results  Airway Mallampati: I  TM Distance: >3 FB Neck ROM: Full    Dental   Pulmonary COPD, Current Smoker,    Pulmonary exam normal        Cardiovascular hypertension, Pt. on medications + CAD and + Past MI  Normal cardiovascular exam     Neuro/Psych    GI/Hepatic (+) Hepatitis -, C  Endo/Other    Renal/GU      Musculoskeletal   Abdominal   Peds  Hematology   Anesthesia Other Findings   Reproductive/Obstetrics                            Anesthesia Physical Anesthesia Plan  ASA: III  Anesthesia Plan: General   Post-op Pain Management:    Induction: Intravenous  PONV Risk Score and Plan: 1  Airway Management Planned: Oral ETT  Additional Equipment:   Intra-op Plan:   Post-operative Plan: Extubation in OR  Informed Consent: I have reviewed the patients History and Physical, chart, labs and discussed the procedure including the risks, benefits and alternatives for the proposed anesthesia with the patient or authorized representative who has indicated his/her understanding and acceptance.       Plan Discussed with: CRNA and Surgeon  Anesthesia Plan Comments: (See PST note 05/22/2018, Konrad Felix, PA-C)       Anesthesia Quick Evaluation

## 2018-05-25 NOTE — Progress Notes (Signed)
Anesthesia Chart Review    Case:  [id number] Date/Time:  05/27/18 0815   Procedure:  RADIOLOGY WITH ANESTHESIA  CT MICROWAVE THERMAL ABLATION-LIVER (N/A )   Anesthesia type:  General   Pre-op diagnosis:  HEPATOCELLULAR CARCINOMA   Location:  [facility] / [facility]   Surgeon:  Aletta Edouard, MD      DISCUSSION: 68 yo current every day smoker (37.5 pack years) with h/o COPD, CAD (MI 11/2017 with synergy stent placed), HLD, , lung cancer (radiation therapy 05/2017), HTN, Hep C (on Mavyret), hepatocellular carcinoma scheduled for above procedure on 05/27/18 with Dr. Aletta Edouard.    Last seen by cardiology, Dr. Carlyle Dolly, on 04/20/2018.  Per Dr. Carlyle Dolly, "Chart reviewed as part of pre-operative protocol coverage. Given past medical history and time since last visit, based on ACC/AHA guidelines,Adam A Simpsonwould be at acceptable risk for the planned procedure without further cardiovascular testing.  OK to hold Brilinta for 5 days prior to biopsy. Synergy stent was used so Brilinta can be held sooner than normal, compared to other DES. Would restart Brilinta when safe from a bleeding standpoint."  Pt with recent hospital admission 05/13/2018-05/18/2018 due to COPD exacerbation and Influenza A.  Per pulmonologist, Dr. Sinda Du, at discharge "He is much improved.  He is scheduled for biopsy of a liver leasion in about 10 days and I think he will be okay to do that." Discharged on home O2, did not use at baseline.  He reports at PST visit on 05/22/18 that he is feeling better, using O2 occassionally.  Completed prednisone dosepak.   Pt can proceed with planned procedure barring acute status change.  VS: BP 99/60 (BP Location: Right Arm)   Pulse 72   Temp 36.7 C (Oral)   Resp 20   Ht _0  (1.753 m)   SpO2 90%   BMI 20.41 kg/m   PROVIDERS: Sinda Du, MD is PCP   Carlyle Dolly, MD is Cardiologist   Christinia Gully, MD is Pulmonologist  LABS: Labs reviewed: Acceptable for  surgery. (all labs ordered are listed, but only abnormal results are displayed)  Labs Reviewed  CBC - Abnormal; Notable for the following components:      Result Value   WBC 19.9 (*)    RBC 3.52 (*)    HCT 37.9 (*)    MCV 107.7 (*)    MCH 37.2 (*)    RDW 10.9 (*)    All other components within normal limits  COMPREHENSIVE METABOLIC PANEL - Abnormal; Notable for the following components:   Sodium 134 (*)    Glucose, Bld 116 (*)    ALT 46 (*)    Total Bilirubin 1.4 (*)    All other components within normal limits     IMAGES: Chest Xray 05/13/2018 IMPRESSION: Hyperinflation compatible with COPD/emphysema  Left upper lobe nodularity and adjacent scarring.  No significant interval change or superimposed acute process by plain radiography.  EKG: 05/13/2018 Rate 97 bpm Sinus rhythm Right superior axis Consider right ventricular hypertrophy   CV: Echo 11/24/2017 Study Conclusions  - Left ventricle: Cannot fully evaluate regional wall motion as   endocardium is not well seen in some views The basal nferior wall   does appear hypokinetic. OVerall LVEF is normal. The cavity size   was normal. Wall thickness was normal. Systolic function was   normal. The estimated ejection fraction was in the range of 55%   to 60%. - Right ventricle: The cavity size was mildly dilated.  11/23/2017 Cardiac Cath  Prox LAD lesion is 25% stenosed.  1st Mrg lesion is 25% stenosed.  Post Atrio lesion is 70% stenosed.  Dist RCA lesion is 25% stenosed.  Mid RCA lesion is 100% stenosed.  After percutaneous thrombectomy, a drug-eluting stent was successfully placed using a STENT SYNERGY DES [id number].  Post intervention, there is a 0% residual stenosis.  The left ventricular systolic function is normal.  LV end diastolic pressure is moderately elevated. LVEDP 27 mm Hg.  The left ventricular ejection fraction is 45-50% by visual estimate.  There is no aortic valve stenosis.    Recommend uninterrupted dual antiplatelet therapy with Aspirin 53m daily and Ticagrelor 967mtwice daily for a minimum of 12 months (ACS - Class I recommendation).   If antiplatelet therapy needed to be stopped earlier due to need for other invasive procedure, this could be discussed since a Synergy stent was placed.  Past Medical History:  Diagnosis Date  . Bilateral lower extremity edema    feet, noticed since discharge from hospital admission 05-18-2018, "i was laid up in the bed for five days"   . CAD (coronary artery disease)   . COPD [place] pulmonary disease) ([place]   for 10 years or more   . Dyspnea    with exertion   . Hepatitis C   . History of radiation therapy 06/03/17- 06/09/17   Left Lung treated to 54 Gy with 3 fx of 18 Gy. SBRT  . HLD (hyperlipidemia)   . Hypertension   . Lung cancer ([facility]  . Myocardial infarction ([facility]   reports 11-2017  . Skin cancer of face 07/2015   skin cancer removed from face by Dr. [place]arri Glennn [place]  Past Surgical History:  Procedure Laterality Date  . COLONOSCOPY  2004   Dr. JeTruett Perna. COLONOSCOPY N/A 08/27/2012   Procedure: COLONOSCOPY;  Surgeon: NaRogene HoustonMD;  [facility];  Service: Endoscopy;  Laterality: N/A;  1200  . CORONARY STENT INTERVENTION N/A 11/23/2017   Procedure: CORONARY STENT INTERVENTION;  Surgeon: VaJettie BoozeMD;  [facility]: [facility];  Service: Cardiovascular;  Laterality: N/A;  . IR RADIOLOGIST EVAL & MGMT  03/04/2018  . LEFT HEART CATH AND CORONARY ANGIOGRAPHY N/A 11/23/2017   Procedure: LEFT HEART CATH AND CORONARY ANGIOGRAPHY;  Surgeon: VaJettie BoozeMD;  [facility];  Service: Cardiovascular;  Laterality: N/A;    MEDICATIONS: . OXYGEN  . albuterol (PROAIR HFA) 108 (90 Base) MCG/ACT inhaler  . albuterol (PROVENTIL) 4 MG tablet  . aspirin EC 81 MG tablet  . azithromycin (ZITHROMAX Z-PAK) 250 MG tablet  . bisoprolol (ZEBETA) 5 MG tablet   . Blood Pressure KIT  . ezetimibe (ZETIA) 10 MG tablet  . famotidine (PEPCID) 20 MG tablet  . fluticasone (FLONASE) 50 MCG/ACT nasal spray  . Glecaprevir-Pibrentasvir (MAVYRET) 100-40 MG TABS  . lisinopril (PRINIVIL,ZESTRIL) 2.5 MG tablet  . Multiple Vitamins-Minerals (MULTIVITAMIN WITH MINERALS) tablet  . nitroGLYCERIN (NITROSTAT) 0.4 MG SL tablet  . pantoprazole (PROTONIX) 40 MG tablet  . Potassium 99 MG TABS  . predniSONE (STERAPRED UNI-PAK 21 TAB) 10 MG (21) TBPK tablet  . Tetrahydrozoline HCl (VISINE OP)  . ticagrelor (BRILINTA) 90 MG TABS tablet  . TRELEGY ELLIPTA 100-62.5-25 MCG/INH AEPB  . vitamin E 400 UNIT capsule   No current facility-administered medications for this encounter.     JeMaia PlanL Pre-Surgical Testing ([phone]/10/20 3:21 PM

## 2018-05-27 ENCOUNTER — Ambulatory Visit (HOSPITAL_COMMUNITY)
Admission: RE | Admit: 2018-05-27 | Discharge: 2018-05-27 | Disposition: A | Discharge status: Home / Self Care | Payer: BLUE CROSS/BLUE SHIELD | Attending: Interventional Radiology | Admitting: Interventional Radiology

## 2018-05-27 ENCOUNTER — Encounter (HOSPITAL_COMMUNITY): Payer: Self-pay

## 2018-05-27 ENCOUNTER — Encounter (HOSPITAL_COMMUNITY)
Admission: RE | Disposition: A | Discharge status: Home / Self Care | Payer: Self-pay | Source: Home / Self Care | Attending: Interventional Radiology

## 2018-05-27 ENCOUNTER — Ambulatory Visit (HOSPITAL_COMMUNITY)
Admission: RE | Admit: 2018-05-27 | Discharge: 2018-05-27 | Disposition: A | Discharge status: Home / Self Care | Payer: BLUE CROSS/BLUE SHIELD | Source: Ambulatory Visit | Attending: Interventional Radiology | Admitting: Interventional Radiology

## 2018-05-27 DIAGNOSIS — J449 Chronic obstructive pulmonary disease, unspecified: Secondary | ICD-10-CM | POA: Insufficient documentation

## 2018-05-27 DIAGNOSIS — F17200 Nicotine dependence, unspecified, uncomplicated: Secondary | ICD-10-CM | POA: Diagnosis not present

## 2018-05-27 DIAGNOSIS — Z923 Personal history of irradiation: Secondary | ICD-10-CM | POA: Diagnosis not present

## 2018-05-27 DIAGNOSIS — Z85118 Personal history of other malignant neoplasm of bronchus and lung: Secondary | ICD-10-CM | POA: Insufficient documentation

## 2018-05-27 DIAGNOSIS — Z955 Presence of coronary angioplasty implant and graft: Secondary | ICD-10-CM | POA: Diagnosis not present

## 2018-05-27 DIAGNOSIS — Z79899 Other long term (current) drug therapy: Secondary | ICD-10-CM | POA: Insufficient documentation

## 2018-05-27 DIAGNOSIS — I10 Essential (primary) hypertension: Secondary | ICD-10-CM | POA: Insufficient documentation

## 2018-05-27 DIAGNOSIS — Z85828 Personal history of other malignant neoplasm of skin: Secondary | ICD-10-CM | POA: Diagnosis not present

## 2018-05-27 DIAGNOSIS — I2510 Atherosclerotic heart disease of native coronary artery without angina pectoris: Secondary | ICD-10-CM | POA: Diagnosis not present

## 2018-05-27 DIAGNOSIS — Z7902 Long term (current) use of antithrombotics/antiplatelets: Secondary | ICD-10-CM | POA: Insufficient documentation

## 2018-05-27 DIAGNOSIS — Z7982 Long term (current) use of aspirin: Secondary | ICD-10-CM | POA: Insufficient documentation

## 2018-05-27 DIAGNOSIS — E785 Hyperlipidemia, unspecified: Secondary | ICD-10-CM | POA: Diagnosis not present

## 2018-05-27 DIAGNOSIS — C220 Liver cell carcinoma: Secondary | ICD-10-CM | POA: Insufficient documentation

## 2018-05-27 DIAGNOSIS — Z5309 Procedure and treatment not carried out because of other contraindication: Secondary | ICD-10-CM | POA: Diagnosis not present

## 2018-05-27 DIAGNOSIS — I252 Old myocardial infarction: Secondary | ICD-10-CM | POA: Insufficient documentation

## 2018-05-27 LAB — COMPREHENSIVE METABOLIC PANEL
ALT: 34 U/L (ref 0–44)
AST: 27 U/L (ref 15–41)
Albumin: 3.4 g/dL — ABNORMAL LOW (ref 3.5–5.0)
Alkaline Phosphatase: 82 U/L (ref 38–126)
Anion gap: 7 (ref 5–15)
BUN: 21 mg/dL (ref 8–23)
CO2: 30 mmol/L (ref 22–32)
Calcium: 7.9 mg/dL — ABNORMAL LOW (ref 8.9–10.3)
Chloride: 99 mmol/L (ref 98–111)
Creatinine, Ser: 0.59 mg/dL — ABNORMAL LOW (ref 0.61–1.24)
GFR calc Af Amer: >60 mL/min (ref >60)
Glucose, Bld: 96 mg/dL (ref 70–99)
Potassium: 3.7 mmol/L (ref 3.5–5.1)
Sodium: 136 mmol/L (ref 135–145)
Total Bilirubin: 1.1 mg/dL (ref 0.3–1.2)
Total Protein: 6.5 g/dL (ref 6.5–8.1)

## 2018-05-27 LAB — TYPE AND SCREEN
ABO/RH(D): O POS
Antibody Screen: NEGATIVE

## 2018-05-27 LAB — CBC WITH DIFFERENTIAL/PLATELET
Abs Immature Granulocytes: 0.29 10*3/uL — ABNORMAL HIGH (ref 0.00–0.07)
Basophils Absolute: 0.1 10*3/uL (ref 0.0–0.1)
Basophils Relative: 0 %
Eosinophils Absolute: 0.2 10*3/uL (ref 0.0–0.5)
Eosinophils Relative: 1 %
HCT: 37.6 % — ABNORMAL LOW (ref 39.0–52.0)
Hemoglobin: 12.3 g/dL — ABNORMAL LOW (ref 13.0–17.0)
Immature Granulocytes: 2 %
Lymphocytes Relative: 17 %
Lymphs Abs: 3.1 10*3/uL (ref 0.7–4.0)
MCH: 35.8 pg — ABNORMAL HIGH (ref 26.0–34.0)
MCHC: 32.7 g/dL (ref 30.0–36.0)
MCV: 109.3 fL — AB (ref 80.0–100.0)
Monocytes Absolute: 1.5 10*3/uL — ABNORMAL HIGH (ref 0.1–1.0)
Monocytes Relative: 8 %
Neutro Abs: 12.7 10*3/uL — ABNORMAL HIGH (ref 1.7–7.7)
Neutrophils Relative %: 72 %
PLATELETS: 266 10*3/uL (ref 150–400)
RBC: 3.44 MIL/uL — ABNORMAL LOW (ref 4.22–5.81)
RDW: 11.3 % — ABNORMAL LOW (ref 11.5–15.5)
WBC: 17.8 10*3/uL — ABNORMAL HIGH (ref 4.0–10.5)
nRBC: 0 % (ref 0.0–0.2)

## 2018-05-27 LAB — PROTIME-INR
INR: 0.98
Prothrombin Time: 12.9 seconds (ref 11.4–15.2)

## 2018-05-27 LAB — ABO/RH: ABO/RH(D): O POS

## 2018-05-27 SURGERY — RADIOLOGY WITH ANESTHESIA
Anesthesia: General

## 2018-05-27 MED ORDER — MIDAZOLAM HCL 2 MG/2ML IJ SOLN
INTRAMUSCULAR | Status: AC
  Filled 2018-05-27: qty 2

## 2018-05-27 MED ORDER — FENTANYL CITRATE (PF) 100 MCG/2ML IJ SOLN
INTRAMUSCULAR | Status: AC
  Filled 2018-05-27: qty 6

## 2018-05-27 MED ORDER — PIPERACILLIN-TAZOBACTAM 3.375 G IVPB 30 MIN
3.3750 g | Freq: Once | INTRAVENOUS | Status: DC
  Filled 2018-05-27: qty 50

## 2018-05-27 MED ORDER — PIPERACILLIN-TAZOBACTAM 3.375 G IVPB: 3.3750 g | Freq: Once | INTRAVENOUS | Status: DC

## 2018-05-27 MED ORDER — LACTATED RINGERS IV SOLN
INTRAVENOUS | Status: DC
  Administered 2018-05-27: 08:00:00 via INTRAVENOUS

## 2018-05-27 NOTE — Discharge Summary (Signed)
Discharge note: Patient presented today for CT-guided microwave ablation of hepatocellular carcinoma.Recently discharged on 2/3 from [facility] with COPD exacerbation due to Influenza A  Infection and discharged on 4L O2 by nasal canula. Today, clearly with labored breathing at rest, but no audible wheezing. Discussed case with Dr. Conrad [place] with Anesthesia who feels Adam Bass would be of clearly higher risk of requiring potential prolonged intubation after general anesthesia. Also discussed case with Dr. Luan Pulling by phone who agrees that we should delay ablation of liver mass under anesthesia. He will see patient in office and we will tentatively reschedule procedure for 2/26, two weeks from today. Patient agreeable and will go back on his Brilinta and hold for 5 days again prior to rescheduled procedure.

## 2018-05-27 NOTE — H&P (Signed)
Referring Physician(s): Rehman,N  Supervising Physician: Aletta Edouard  Patient Status:  [facility] TBA  Chief Complaint:  Hepatocellular carcinoma  Subjective: Patient familiar to IR service from prior left upper lobe lung nodule biopsy on 10/18/2016 as well as left lower lobe lung nodule biopsy on 04/10/2017.  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  He was seen in consultation by Dr. Kathlene Cote on 03/13/2018 to discuss treatment options for [facility] and deemed an appropriate candidate for CT-guided microwave/thermal ablation of a 2.6 cm left hepatic lobe HCC.  He presents today for the procedure.  He was diagnosed with the flu in late January of this year and has completed Tamiflu.  He was also given a steroid Dosepak for treatment of COPD exacerbation. He currently denies fever, headache, chest pain, back pain, nausea, vomiting or bleeding.  He does have dyspnea with exertion, occasional cough, indigestion, and some lower extremity swelling.  Past Medical History:  Diagnosis Date  . Bilateral lower extremity edema    feet, noticed since discharge from hospital admission 05-18-2018, "i was laid up in the bed for five days"   . CAD (coronary artery disease)   . COPD (chronic obstructive pulmonary disease) ([place])    for 10 years or more   . Dyspnea    with exertion   . Hepatitis C   . History of radiation therapy 06/03/17- 06/09/17   Left Lung treated to 54 Gy with 3 fx of 18 Gy. SBRT  . HLD (hyperlipidemia)   . Hypertension   . Lung cancer ([place])   . Myocardial infarction ([place])    reports 11-2017  . Skin cancer of face 07/2015   skin cancer removed from face by Dr. Tarri Glenn in [place]   Past Surgical History:  Procedure Laterality Date  . COLONOSCOPY  2004   Dr. Truett Perna  . COLONOSCOPY  N/A 08/27/2012   Procedure: COLONOSCOPY;  Surgeon: Rogene Houston, MD;  [facility];  Service: Endoscopy;  Laterality: N/A;  1200  . CORONARY STENT INTERVENTION N/A 11/23/2017   Procedure: CORONARY STENT INTERVENTION;  Surgeon: Jettie Booze, MD;  Location: [place] CV LAB;  Service: Cardiovascular;  Laterality: N/A;  . IR RADIOLOGIST EVAL & MGMT  03/04/2018  . LEFT HEART CATH AND CORONARY ANGIOGRAPHY N/A 11/23/2017   Procedure: LEFT HEART CATH AND CORONARY ANGIOGRAPHY;  Surgeon: Jettie Booze, MD;  Location: [place] CV LAB;  Service: Cardiovascular;  Laterality: N/A;        Allergies: Bee venom  Medications: Prior to Admission medications   Medication Sig Start Date End Date Taking? Authorizing Provider  albuterol (PROAIR HFA) 108 (90 Base) MCG/ACT inhaler Inhale 2 puffs into the lungs every 6 (six) hours as needed for wheezing.    Yes [provider]  albuterol (PROVENTIL) 4 MG tablet Take 4 mg by mouth 3 (three) times daily.   Yes [provider]  aspirin EC 81 MG tablet Take 81 mg by mouth daily. Take with brilanta   Yes [provider]  bisoprolol (ZEBETA) 5 MG tablet Take 0.5 tablets (2.5 mg total) by mouth daily. 11/28/17  Yes Cheryln Manly, NP  ezetimibe (ZETIA) 10 MG tablet Take 1 tablet (10 mg total) by mouth daily. 11/27/17 05/26/18 Yes Cheryln Manly, NP  famotidine (PEPCID) 20 MG tablet Take 20 mg by mouth daily as needed for heartburn or indigestion.   Yes [provider]  fluticasone (FLONASE) 50 MCG/ACT nasal spray Place 1 spray into both nostrils daily as needed for allergies.    Yes [provider]  Glecaprevir-Pibrentasvir (MAVYRET) 100-40 MG TABS Take 3 tablets by mouth daily.    Yes [provider]  lisinopril (PRINIVIL,ZESTRIL) 2.5 MG tablet Take 1 tablet (2.5 mg total) by mouth daily. 04/20/18 07/19/18 Yes BranchAlphonse Guild, MD  Multiple Vitamins-Minerals (MULTIVITAMIN WITH  MINERALS) tablet Take 1 tablet by mouth daily.   Yes [provider]  nitroGLYCERIN (NITROSTAT) 0.4 MG SL tablet Place 1 tablet (0.4 mg total) under the tongue every 5 (five) minutes as needed. 12/11/17  Yes [name], NP  pantoprazole (PROTONIX) 40 MG tablet Take 40 mg by mouth daily.  02/02/18  Yes [provider]  Potassium 99 MG TABS Take 1 tablet by mouth daily.    Yes [provider]  Tetrahydrozoline HCl (VISINE OP) Place 1 drop into both eyes daily as needed ([place]).   Yes [provider]  ticagrelor (BRILINTA) 90 MG TABS tablet Take 1 tablet (90 mg total) by mouth 2 (two) times daily. 11/27/17  Yes [name] B, NP  TRELEGY ELLIPTA 100-62.5-25 MCG/INH AEPB INHALE 1 PUFF INTO THE LUNGS EVERY DAY Patient taking differently: Inhale 1 puff into the lungs daily.  12/24/17  Yes Tanda Rockers, MD  vitamin E 400 UNIT capsule Take 1 tablet by mouth daily.    Yes [provider]  azithromycin (ZITHROMAX Z-PAK) 250 MG tablet Take by package instructions 05/18/18   Sinda Du, MD  Blood Pressure KIT 1 Units by Does not apply route daily. 11/27/17   Cheryln Manly, NP  OXYGEN Inhale into the lungs as needed. 4 Liters supplemental o2 , as needed "when im up moving around"    [provider]  predniSONE (STERAPRED UNI-PAK 21 TAB) 10 MG (21) TBPK tablet Take by package instructions 05/18/18   Sinda Du, MD     Vital Signs: BP (!) 89/65 (BP Location: Right Arm)   Pulse 70   Temp 97.7 F (36.5 C) (Oral)   Resp (!) 21   SpO2 100%  CURRENT BP 92/65  Physical Exam awake, alert.  Chest with distant breath sounds bilaterally.  Heart with regular rate and rhythm.  Abdomen soft, positive bowel sounds, mild diffuse tenderness to palpation.  Some mild edema noted bilateral ankle regions. Imaging: No results found.  Labs:  CBC: Recent Labs    11/25/17 0233 11/27/17 0406 05/13/18 1904 05/22/18 1413  WBC 10.8* 11.3* 11.2*  19.9*  HGB 14.3 13.7 16.7 13.1  HCT 42.6 40.1 49.1 37.9*  PLT 205 193 290 289    COAGS: Recent Labs    11/23/17 1532 02/20/18 1051  INR 1.17 1.1  APTT 140*  --     BMP: Recent Labs    11/27/17 0406 12/09/17 1257 02/17/18 1036 05/13/18 1904 05/22/18 1413  NA 138 139  --  133* 134*  K 4.0 4.7  --  3.5 4.7  CL 103 101  --  100 98  CO2 28 23  --  24 29  GLUCOSE 103* 96  --  163* 116*  BUN 13 16  --  14 18  CALCIUM 8.5* 9.0  --  8.6* 8.9  CREATININE 0.68 0.91 0.80 0.79 0.64  GFRNONAA >60 87  --  >60 >60  GFRAA >60 100  --  >60 >60    LIVER FUNCTION TESTS: Recent Labs    12/09/17 1257 01/01/18 0731 02/02/18 1426 05/13/18 1904 05/22/18 1413  BILITOT 0.7 0.4 0.7 1.8* 1.4*  AST 92* 68* 57* 36 32  ALT 121* 91* 76* 35 46*  ALKPHOS 102 112  --  97 60  PROT 7.2 6.9 6.9 8.5* 6.5  ALBUMIN 4.0 3.9  --  4.3 3.6    Assessment and Plan: Pt with history of COPD, long-term tobacco abuse, 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  He was seen in consultation by Dr. Kathlene Cote on 03/13/2018 to discuss treatment options for [facility] and deemed an appropriate candidate for CT-guided microwave/thermal ablation of a 2.6 cm left hepatic lobe HCC.  He presents today for the procedure.  Details/risks of procedure, including but not limited to, internal bleeding, infection, injury to adjacent structures, anesthesia related complications discussed with patient/daughter with their understanding and consent.This procedure involves the use of X-rays and because of the nature of the planned procedure, it is possible that we will have prolonged use of X-ray fluoroscopy.  Potential radiation risks to you include (but are not limited to) the following: - A slightly elevated risk for cancer  several years later in life. This risk is typically less than 0.5%  percent. This risk is low in comparison to the normal incidence of human cancer, which is 33% for women and 50% for men according to the [employer]. - Radiation induced injury can include skin redness, resembling a rash, tissue breakdown / ulcers and hair loss (which can be temporary or permanent).   The likelihood of either of these occurring depends on the difficulty of the procedure and whether you are sensitive to radiation due to previous procedures, disease, or genetic conditions.   IF your procedure requires a prolonged use of radiation, you will be notified and given written instructions for further action.  It is your responsibility to monitor the irradiated area for the 2 weeks following the procedure and to notify your physician if you are concerned that you have suffered a radiation induced injury.    Post procedure he will be admitted for overnight observation.  LABS PENDING   Electronically Signed: D. Rowe Robert, PA-C 05/27/2018, 7:47 AM   I spent a total of 30 minutes at the the patient's bedside AND on the patient's hospital floor or unit, greater than 50% of which was counseling/coordinating care for CT-guided microwave ablation/possible biopsy of left hepatic lobe tumor/hepatocellular carcinoma

## 2018-05-29 DIAGNOSIS — C3491 Malignant neoplasm of unspecified part of right bronchus or lung: Secondary | ICD-10-CM | POA: Diagnosis not present

## 2018-05-29 DIAGNOSIS — F17200 Nicotine dependence, unspecified, uncomplicated: Secondary | ICD-10-CM | POA: Diagnosis not present

## 2018-05-29 DIAGNOSIS — J441 Chronic obstructive pulmonary disease with (acute) exacerbation: Secondary | ICD-10-CM | POA: Diagnosis not present

## 2018-05-29 DIAGNOSIS — C787 Secondary malignant neoplasm of liver and intrahepatic bile duct: Secondary | ICD-10-CM | POA: Diagnosis not present

## 2018-05-30 DIAGNOSIS — J09X2 Influenza due to identified novel influenza A virus with other respiratory manifestations: Secondary | ICD-10-CM | POA: Diagnosis not present

## 2018-05-30 DIAGNOSIS — J441 Chronic obstructive pulmonary disease with (acute) exacerbation: Secondary | ICD-10-CM | POA: Diagnosis not present

## 2018-05-30 DIAGNOSIS — J9601 Acute respiratory failure with hypoxia: Secondary | ICD-10-CM | POA: Diagnosis not present

## 2018-05-30 DIAGNOSIS — B1920 Unspecified viral hepatitis C without hepatic coma: Secondary | ICD-10-CM | POA: Diagnosis not present

## 2018-05-30 DIAGNOSIS — I2510 Atherosclerotic heart disease of native coronary artery without angina pectoris: Secondary | ICD-10-CM | POA: Diagnosis not present

## 2018-05-30 DIAGNOSIS — I10 Essential (primary) hypertension: Secondary | ICD-10-CM | POA: Diagnosis not present

## 2018-05-30 DIAGNOSIS — E785 Hyperlipidemia, unspecified: Secondary | ICD-10-CM | POA: Diagnosis not present

## 2018-05-30 DIAGNOSIS — C3432 Malignant neoplasm of lower lobe, left bronchus or lung: Secondary | ICD-10-CM | POA: Diagnosis not present

## 2018-06-01 ENCOUNTER — Telehealth: Payer: Self-pay | Admitting: *Deleted

## 2018-06-01 DIAGNOSIS — J09X2 Influenza due to identified novel influenza A virus with other respiratory manifestations: Secondary | ICD-10-CM | POA: Diagnosis not present

## 2018-06-01 DIAGNOSIS — I10 Essential (primary) hypertension: Secondary | ICD-10-CM | POA: Diagnosis not present

## 2018-06-01 DIAGNOSIS — I2510 Atherosclerotic heart disease of native coronary artery without angina pectoris: Secondary | ICD-10-CM | POA: Diagnosis not present

## 2018-06-01 DIAGNOSIS — C3432 Malignant neoplasm of lower lobe, left bronchus or lung: Secondary | ICD-10-CM | POA: Diagnosis not present

## 2018-06-01 DIAGNOSIS — E785 Hyperlipidemia, unspecified: Secondary | ICD-10-CM | POA: Diagnosis not present

## 2018-06-01 DIAGNOSIS — B1920 Unspecified viral hepatitis C without hepatic coma: Secondary | ICD-10-CM | POA: Diagnosis not present

## 2018-06-01 DIAGNOSIS — J9601 Acute respiratory failure with hypoxia: Secondary | ICD-10-CM | POA: Diagnosis not present

## 2018-06-01 DIAGNOSIS — J441 Chronic obstructive pulmonary disease with (acute) exacerbation: Secondary | ICD-10-CM | POA: Diagnosis not present

## 2018-06-01 NOTE — Telephone Encounter (Signed)
Called patient to inform of CT for 06-04-18 - arrival time - 4:15 pm @ [facility], no restrictions to test, pt. to follow-up with Dr. Isidore Moos for results on 06-05-18, spoke with patient and he is aware of these appts.

## 2018-06-01 NOTE — Progress Notes (Signed)
error 

## 2018-06-03 DIAGNOSIS — J09X2 Influenza due to identified novel influenza A virus with other respiratory manifestations: Secondary | ICD-10-CM | POA: Diagnosis not present

## 2018-06-03 DIAGNOSIS — C3432 Malignant neoplasm of lower lobe, left bronchus or lung: Secondary | ICD-10-CM | POA: Diagnosis not present

## 2018-06-03 DIAGNOSIS — I10 Essential (primary) hypertension: Secondary | ICD-10-CM | POA: Diagnosis not present

## 2018-06-03 DIAGNOSIS — B1920 Unspecified viral hepatitis C without hepatic coma: Secondary | ICD-10-CM | POA: Diagnosis not present

## 2018-06-03 DIAGNOSIS — J9601 Acute respiratory failure with hypoxia: Secondary | ICD-10-CM | POA: Diagnosis not present

## 2018-06-03 DIAGNOSIS — E785 Hyperlipidemia, unspecified: Secondary | ICD-10-CM | POA: Diagnosis not present

## 2018-06-03 DIAGNOSIS — J441 Chronic obstructive pulmonary disease with (acute) exacerbation: Secondary | ICD-10-CM | POA: Diagnosis not present

## 2018-06-03 DIAGNOSIS — I2510 Atherosclerotic heart disease of native coronary artery without angina pectoris: Secondary | ICD-10-CM | POA: Diagnosis not present

## 2018-06-04 ENCOUNTER — Ambulatory Visit (HOSPITAL_COMMUNITY): Payer: BLUE CROSS/BLUE SHIELD

## 2018-06-05 ENCOUNTER — Telehealth: Payer: Self-pay | Admitting: *Deleted

## 2018-06-05 ENCOUNTER — Inpatient Hospital Stay
Admission: RE | Admit: 2018-06-05 | Discharge: 2018-06-05 | Disposition: A | Discharge status: Home / Self Care | Payer: Self-pay | Source: Ambulatory Visit | Attending: Radiation Oncology | Admitting: Radiation Oncology

## 2018-06-05 DIAGNOSIS — J9611 Chronic respiratory failure with hypoxia: Secondary | ICD-10-CM | POA: Diagnosis not present

## 2018-06-05 DIAGNOSIS — J449 Chronic obstructive pulmonary disease, unspecified: Secondary | ICD-10-CM | POA: Diagnosis not present

## 2018-06-05 DIAGNOSIS — I2510 Atherosclerotic heart disease of native coronary artery without angina pectoris: Secondary | ICD-10-CM | POA: Diagnosis not present

## 2018-06-05 DIAGNOSIS — C787 Secondary malignant neoplasm of liver and intrahepatic bile duct: Secondary | ICD-10-CM | POA: Diagnosis not present

## 2018-06-05 NOTE — Telephone Encounter (Signed)
Called patient to ask about missing scan, pt. states that he wasn't feeling well, he has rescheduled scan for 06-19-18 and his fu for 06-26-18 with Dr. Isidore Moos

## 2018-06-08 DIAGNOSIS — E785 Hyperlipidemia, unspecified: Secondary | ICD-10-CM | POA: Diagnosis not present

## 2018-06-08 DIAGNOSIS — B1920 Unspecified viral hepatitis C without hepatic coma: Secondary | ICD-10-CM | POA: Diagnosis not present

## 2018-06-08 DIAGNOSIS — I10 Essential (primary) hypertension: Secondary | ICD-10-CM | POA: Diagnosis not present

## 2018-06-08 DIAGNOSIS — J441 Chronic obstructive pulmonary disease with (acute) exacerbation: Secondary | ICD-10-CM | POA: Diagnosis not present

## 2018-06-08 DIAGNOSIS — J09X2 Influenza due to identified novel influenza A virus with other respiratory manifestations: Secondary | ICD-10-CM | POA: Diagnosis not present

## 2018-06-08 DIAGNOSIS — C3432 Malignant neoplasm of lower lobe, left bronchus or lung: Secondary | ICD-10-CM | POA: Diagnosis not present

## 2018-06-08 DIAGNOSIS — I2510 Atherosclerotic heart disease of native coronary artery without angina pectoris: Secondary | ICD-10-CM | POA: Diagnosis not present

## 2018-06-08 DIAGNOSIS — J9601 Acute respiratory failure with hypoxia: Secondary | ICD-10-CM | POA: Diagnosis not present

## 2018-06-08 NOTE — Patient Instructions (Addendum)
Adam Bass  May 29, 1950     Your procedure is scheduled on:   06-10-2018    Report to [facility] Main  Entrance,   Report to Radiology  at  7:00 AM    Call this number if you have problems the morning of surgery [phone]       Remember: Do not eat food or drink liquids After Midnight. This includes water, candy, gum, mints  BRUSH YOUR TEETH MORNING OF SURGERY AND RINSE YOUR MOUTH OUT.        Take these medicines the morning of surgery with A SIP OF WATER:  Trelegy-Ellipta inhaler, Albuterol tablet,  Bisprolol,  Pantoprzole (protonix)  Glecaprevir-Pibrentasuir (mavyret),  ProAir inhaler if needed and bring inhaler's with you day of sugery                                   You may not have any metal on your body including hair pins and               piercings  Do not wear jewelry, make-up, lotions, powders or perfumes, deodorant                          Men may shave face and neck.      Do not bring valuables to the hospital. [place].  Contacts, dentures or bridgework may not be worn into surgery.  Leave suitcase in the car. After surgery it may be brought to your room.     _____________________________________________________________________           [facility] - Preparing for Surgery Before surgery, you can play an important role.  Because skin is not sterile, your skin needs to be as free of germs as possible.  You can reduce the number of germs on your skin by washing with CHG (chlorahexidine gluconate) soap before surgery.  CHG is an antiseptic cleaner which kills germs and bonds with the skin to continue killing germs even after washing. Please DO NOT use if you have an allergy to CHG or antibacterial soaps.  If your skin becomes reddened/irritated stop using the CHG and inform your nurse when you arrive at Short Stay. Do not shave (including legs and underarms) for at  least 48 hours prior to the first CHG shower.  You may shave your face/neck. Please follow these instructions carefully:  1.  Shower with CHG Soap the night before surgery and the  morning of Surgery.  2.  If you choose to wash your hair, wash your hair first as usual with your  normal  shampoo.  3.  After you shampoo, rinse your hair and body thoroughly to remove the  shampoo.                            4.  Use CHG as you would any other liquid soap.  You can apply chg directly  to the skin and wash                       Gently with a scrungie or clean washcloth.  5.  Apply the  CHG Soap to your body ONLY FROM THE NECK DOWN.   Do not use on face/ open                           Wound or open sores. Avoid contact with eyes, ears mouth and genitals (private parts).                       Wash face,  Genitals (private parts) with your normal soap.             6.  Wash thoroughly, paying special attention to the area where your surgery  will be performed.  7.  Thoroughly rinse your body with warm water from the neck down.  8.  DO NOT shower/wash with your normal soap after using and rinsing off  the CHG Soap.             9.  Pat yourself dry with a clean towel.            10.  Wear clean pajamas.            11.  Place clean sheets on your bed the night of your first shower and do not  sleep with pets. Day of Surgery : Do not apply any lotions/deodorants the morning of surgery.  Please wear clean clothes to the hospital/surgery center.  FAILURE TO FOLLOW THESE INSTRUCTIONS MAY RESULT IN THE CANCELLATION OF YOUR SURGERY PATIENT SIGNATURE_________________________________  NURSE SIGNATURE__________________________________  ________________________________________________________________________

## 2018-06-09 ENCOUNTER — Other Ambulatory Visit: Payer: Self-pay | Admitting: Radiology

## 2018-06-09 ENCOUNTER — Other Ambulatory Visit: Payer: Self-pay | Admitting: Physician Assistant

## 2018-06-09 ENCOUNTER — Encounter (HOSPITAL_COMMUNITY)
Admission: RE | Admit: 2018-06-09 | Discharge: 2018-06-09 | Disposition: A | Discharge status: Home / Self Care | Payer: BLUE CROSS/BLUE SHIELD | Source: Ambulatory Visit

## 2018-06-09 ENCOUNTER — Encounter (HOSPITAL_COMMUNITY): Payer: Self-pay

## 2018-06-09 ENCOUNTER — Ambulatory Visit (HOSPITAL_COMMUNITY)
Admission: RE | Admit: 2018-06-09 | Discharge: 2018-06-09 | Disposition: A | Discharge status: Home / Self Care | Payer: BLUE CROSS/BLUE SHIELD | Source: Ambulatory Visit | Attending: Physician Assistant | Admitting: Physician Assistant

## 2018-06-09 DIAGNOSIS — Z01818 Encounter for other preprocedural examination: Secondary | ICD-10-CM | POA: Insufficient documentation

## 2018-06-09 DIAGNOSIS — J439 Emphysema, unspecified: Secondary | ICD-10-CM | POA: Diagnosis not present

## 2018-06-09 HISTORY — DX: Unspecified urinary incontinence: R32

## 2018-06-09 HISTORY — DX: Presence of coronary angioplasty implant and graft: Z95.5

## 2018-06-09 HISTORY — DX: Presence of dental prosthetic device (complete) (partial): Z97.2

## 2018-06-09 HISTORY — DX: Other forms of dyspnea: R06.09

## 2018-06-09 HISTORY — DX: Dependence on supplemental oxygen: Z99.81

## 2018-06-09 HISTORY — DX: Other specified cough: R05.8

## 2018-06-09 HISTORY — DX: Dyspnea, unspecified: R06.00

## 2018-06-09 HISTORY — DX: Presence of spectacles and contact lenses: Z97.3

## 2018-06-09 HISTORY — DX: Personal history of other malignant neoplasm of skin: Z85.828

## 2018-06-09 HISTORY — DX: Long term (current) use of anticoagulants: Z79.01

## 2018-06-09 HISTORY — DX: Cough: R05

## 2018-06-09 HISTORY — DX: Liver cell carcinoma: C22.0

## 2018-06-09 HISTORY — DX: Malignant neoplasm of unspecified part of left bronchus or lung: C34.92

## 2018-06-09 HISTORY — DX: Abdominal aortic ectasia: I77.811

## 2018-06-09 HISTORY — DX: Emphysema, unspecified: J43.9

## 2018-06-09 HISTORY — DX: Old myocardial infarction: I25.2

## 2018-06-09 LAB — COMPREHENSIVE METABOLIC PANEL
ALT: 34 U/L (ref 0–44)
AST: 28 U/L (ref 15–41)
Albumin: 4.1 g/dL (ref 3.5–5.0)
Alkaline Phosphatase: 71 U/L (ref 38–126)
Anion gap: 7 (ref 5–15)
BUN: 22 mg/dL (ref 8–23)
CO2: 32 mmol/L (ref 22–32)
CREATININE: 0.82 mg/dL (ref 0.61–1.24)
Calcium: 8.5 mg/dL — ABNORMAL LOW (ref 8.9–10.3)
Chloride: 99 mmol/L (ref 98–111)
GFR calc Af Amer: >60 mL/min (ref >60)
GFR calc non Af Amer: >60 mL/min (ref >60)
Glucose, Bld: 148 mg/dL — ABNORMAL HIGH (ref 70–99)
Potassium: 3.6 mmol/L (ref 3.5–5.1)
Sodium: 138 mmol/L (ref 135–145)
Total Bilirubin: 0.8 mg/dL (ref 0.3–1.2)
Total Protein: 7.3 g/dL (ref 6.5–8.1)

## 2018-06-09 MED ORDER — DEXTROSE 5 % IV SOLN: 2.0000 g | Freq: Once | INTRAVENOUS | Status: DC

## 2018-06-09 NOTE — Progress Notes (Signed)
Anesthesia Chart Review:   Case:  [id number] Date/Time:  06/10/18 0815   Procedure:  MICROWAVE THERMAL ABLATION LIVER (N/A )   Anesthesia type:  General   Pre-op diagnosis:  HEPATOCELLULAR CARCINOMA   Location:  [facility] / [facility]   Surgeon:  Adam Edouard, MD      DISCUSSION:  Pt is a 68 year old male with hx CAD (s/p thrombectomy and Synergy stent to RCA 11/23/17), lung cancer (s/p radiation 05/2017), COPD, hepatitis C (undergoing treatment), hepatocellular carcinoma  Procedure was scheduled for 05/27/18, but was postponed due to recent COPD exacerbation/influenza diagnosis (hospitalized 1/29-05/18/18).  Pt reported to PST RN today he feels at his usual baseline.   Pt to hold brilinta 5 days prior to procedure (per cardiology notes, synergy stent used 11/2017 in anticipation of pt needing intervention in near future; has received tx for 6 months, ok to hold brilinta for procedure).    VS: BP 134/61   Pulse 76   Temp 37 C (Oral)   Resp (!) 22   Ht 5' 9" (1.753 m)   Wt 65.3 kg   SpO2 95%   BMI 21.27 kg/m    PROVIDERS: - PCP is Adam Du, MD who is also pt's pulmonologist - Cardiologist is Adam Dolly, MD who cleared pt for procedure at last office visit 04/20/18.    LABS: Labs reviewed: Acceptable for surgery.  - CBC and PT/PTT will be obtained day of procedure.   (all labs ordered are listed, but only abnormal results are displayed)  Labs Reviewed  COMPREHENSIVE METABOLIC PANEL - Abnormal; Notable for the following components:      Result Value   Glucose, Bld 148 (*)    Calcium 8.5 (*)    All other components within normal limits  COMPREHENSIVE METABOLIC PANEL  TYPE AND SCREEN     IMAGES:  CXR 05/13/18:  - Hyperinflation compatible with COPD/emphysema - Left upper lobe nodularity and adjacent scarring. - No significant interval change or superimposed acute process by plain radiography.  EKG 05/13/18: Sinus rhythm. Right superior axis. Consider  RVH    CV:  Echo 11/24/17:  - Left ventricle: Cannot fully evaluate regional wall motion as endocardium is not well seen in some views The basal nferior wall does appear hypokinetic. OVerall LVEF is normal. The cavity sizewas normal. Wall thickness was normal. Systolic function wasnormal. The estimated ejection fraction was in the range of 55%to 60%. - Right ventricle: The cavity size was mildly dilated.  Cardiac cath 11/23/17:   Prox LAD lesion is 25% stenosed.  1st Mrg lesion is 25% stenosed.  Post Atrio lesion is 70% stenosed.  Dist RCA lesion is 25% stenosed.  Mid RCA lesion is 100% stenosed.  After percutaneous thrombectomy, DES successfully placed mid RCA using a STENT SYNERGY DES 3X28.  Post intervention, there is a 0% residual stenosis.  The left ventricular systolic function is normal.  LV end diastolic pressure is moderately elevated. LVEDP 27 mm Hg.  The left ventricular ejection fraction is 45-50% by visual estimate.  There is no aortic valve stenosis. - Recommend uninterrupted dual antiplatelet therapy with Aspirin 45m daily and Ticagrelor 915mtwice daily for a minimum of 12 months (ACS - Class I recommendation). - If antiplatelet therapy needed to be stopped earlier due to need for other invasive procedure, this could be discussed since a Synergy stent was placed.    Past Medical History:  Diagnosis Date  . Bilateral lower extremity edema    feet, noticed  since discharge from hospital admission 05-18-2018, "i was laid up in the bed for five days"   . CAD (coronary artery disease)   . COPD (chronic obstructive pulmonary disease) ([place])    for 10 years or more   . DOE (dyspnea on exertion)   . Hepatitis C   . History of radiation therapy 06/03/17- 06/09/17   Left Lung treated to 54 Gy with 3 fx of 18 Gy. SBRT  . HLD (hyperlipidemia)   . Hypertension   . Lung cancer ([place])   . Myocardial infarction ([place])    reports 11-2017  . Productive cough    per pt  mostly clear  . Skin cancer of face 07/2015   skin cancer removed from face by Dr. Tarri Bass in [place]  . Urinary incontinence    wear depends  . Wears dentures    fuller upper,  lower partial  . Wears glasses     Past Surgical History:  Procedure Laterality Date  . COLONOSCOPY N/A 08/27/2012   Procedure: COLONOSCOPY;  Surgeon: Adam Houston, MD;  [facility];  Service: Endoscopy;  Laterality: N/A;  1200  . CORONARY STENT INTERVENTION N/A 11/23/2017   Procedure: CORONARY STENT INTERVENTION;  Surgeon: Adam Booze, MD;  Location: [place] [facility];  Service: Cardiovascular;  Laterality: N/A;  . IR RADIOLOGIST EVAL & MGMT  03/04/2018  . LEFT HEART CATH AND CORONARY ANGIOGRAPHY N/A 11/23/2017   Procedure: LEFT HEART CATH AND CORONARY ANGIOGRAPHY;  Surgeon: Adam Booze, MD;  Location: [place] [facility];  Service: Cardiovascular;  Laterality: N/A;    MEDICATIONS: . albuterol (PROAIR HFA) 108 (90 Base) MCG/ACT inhaler  . albuterol (PROVENTIL) 4 MG tablet  . aspirin EC 81 MG tablet  . bisoprolol (ZEBETA) 5 MG tablet  . Blood Pressure KIT  . ezetimibe (ZETIA) 10 MG tablet  . famotidine (PEPCID) 20 MG tablet  . fluticasone (FLONASE) 50 MCG/ACT nasal spray  . Glecaprevir-Pibrentasvir (MAVYRET) 100-40 MG TABS  . lisinopril (PRINIVIL,ZESTRIL) 2.5 MG tablet  . Multiple Vitamins-Minerals (MULTIVITAMIN WITH MINERALS) tablet  . nitroGLYCERIN (NITROSTAT) 0.4 MG SL tablet  . OXYGEN  . pantoprazole (PROTONIX) 40 MG tablet  . Potassium 99 MG TABS  . Tetrahydrozoline HCl (VISINE OP)  . ticagrelor (BRILINTA) 90 MG TABS tablet  . TRELEGY ELLIPTA 100-62.5-25 MCG/INH AEPB  . vitamin E 400 UNIT capsule   No current facility-administered medications for this encounter.     If labs acceptable day of surgery, I anticipate pt can proceed with surgery as scheduled.  Willeen Cass, FNP-BC [facility]/Anesthesiology Phone: [phone] 06/09/2018 4:52  PM

## 2018-06-09 NOTE — Progress Notes (Signed)
Cardiac clearance dated 04-17-2018 with chart  , dr Lenna Sciara. Adam Bass, and  last office note dated 04-20-2018 in epic.   EKG dated 05-13-2018 in epic. ECHO dated 11-24-2017 in epic. Cardiac cath dated 11-23-2017 in epic.

## 2018-06-10 ENCOUNTER — Encounter (HOSPITAL_COMMUNITY)
Admission: AD | Disposition: A | Discharge status: Home / Self Care | Payer: Self-pay | Source: Home / Self Care | Attending: Interventional Radiology

## 2018-06-10 ENCOUNTER — Other Ambulatory Visit (INDEPENDENT_AMBULATORY_CARE_PROVIDER_SITE_OTHER): Payer: Self-pay | Admitting: *Deleted

## 2018-06-10 ENCOUNTER — Ambulatory Visit (HOSPITAL_COMMUNITY)
Admit: 2018-06-10 | Discharge: 2018-06-10 | Disposition: A | Discharge status: Home / Self Care | Payer: BLUE CROSS/BLUE SHIELD | Attending: Interventional Radiology | Admitting: Interventional Radiology

## 2018-06-10 ENCOUNTER — Ambulatory Visit (HOSPITAL_COMMUNITY): Payer: BLUE CROSS/BLUE SHIELD | Admitting: Certified Registered Nurse Anesthetist

## 2018-06-10 ENCOUNTER — Observation Stay (HOSPITAL_COMMUNITY)
Admission: AD | Admit: 2018-06-10 | Discharge: 2018-06-11 | Disposition: A | Discharge status: Home / Self Care | Payer: BLUE CROSS/BLUE SHIELD | Attending: Interventional Radiology | Admitting: Interventional Radiology

## 2018-06-10 ENCOUNTER — Encounter (HOSPITAL_COMMUNITY): Payer: Self-pay | Admitting: General Practice

## 2018-06-10 ENCOUNTER — Encounter (INDEPENDENT_AMBULATORY_CARE_PROVIDER_SITE_OTHER): Payer: Self-pay | Admitting: *Deleted

## 2018-06-10 DIAGNOSIS — Z923 Personal history of irradiation: Secondary | ICD-10-CM | POA: Diagnosis not present

## 2018-06-10 DIAGNOSIS — Z955 Presence of coronary angioplasty implant and graft: Secondary | ICD-10-CM | POA: Insufficient documentation

## 2018-06-10 DIAGNOSIS — Z7902 Long term (current) use of antithrombotics/antiplatelets: Secondary | ICD-10-CM | POA: Insufficient documentation

## 2018-06-10 DIAGNOSIS — I10 Essential (primary) hypertension: Secondary | ICD-10-CM | POA: Diagnosis not present

## 2018-06-10 DIAGNOSIS — Z85828 Personal history of other malignant neoplasm of skin: Secondary | ICD-10-CM | POA: Diagnosis not present

## 2018-06-10 DIAGNOSIS — J449 Chronic obstructive pulmonary disease, unspecified: Secondary | ICD-10-CM | POA: Insufficient documentation

## 2018-06-10 DIAGNOSIS — Z7951 Long term (current) use of inhaled steroids: Secondary | ICD-10-CM | POA: Diagnosis not present

## 2018-06-10 DIAGNOSIS — Z9981 Dependence on supplemental oxygen: Secondary | ICD-10-CM | POA: Insufficient documentation

## 2018-06-10 DIAGNOSIS — I2510 Atherosclerotic heart disease of native coronary artery without angina pectoris: Secondary | ICD-10-CM | POA: Diagnosis not present

## 2018-06-10 DIAGNOSIS — B1710 Acute hepatitis C without hepatic coma: Secondary | ICD-10-CM

## 2018-06-10 DIAGNOSIS — Z7982 Long term (current) use of aspirin: Secondary | ICD-10-CM | POA: Insufficient documentation

## 2018-06-10 DIAGNOSIS — K7460 Unspecified cirrhosis of liver: Secondary | ICD-10-CM | POA: Diagnosis not present

## 2018-06-10 DIAGNOSIS — E785 Hyperlipidemia, unspecified: Secondary | ICD-10-CM | POA: Insufficient documentation

## 2018-06-10 DIAGNOSIS — F17200 Nicotine dependence, unspecified, uncomplicated: Secondary | ICD-10-CM | POA: Insufficient documentation

## 2018-06-10 DIAGNOSIS — C3482 Malignant neoplasm of overlapping sites of left bronchus and lung: Secondary | ICD-10-CM | POA: Diagnosis not present

## 2018-06-10 DIAGNOSIS — I252 Old myocardial infarction: Secondary | ICD-10-CM | POA: Insufficient documentation

## 2018-06-10 DIAGNOSIS — C220 Liver cell carcinoma: Principal | ICD-10-CM | POA: Insufficient documentation

## 2018-06-10 DIAGNOSIS — Z79899 Other long term (current) drug therapy: Secondary | ICD-10-CM | POA: Insufficient documentation

## 2018-06-10 DIAGNOSIS — B1920 Unspecified viral hepatitis C without hepatic coma: Secondary | ICD-10-CM | POA: Insufficient documentation

## 2018-06-10 HISTORY — PX: RADIOFREQUENCY ABLATION: SHX2290

## 2018-06-10 LAB — CBC
HCT: 41.3 % (ref 39.0–52.0)
Hemoglobin: 13.3 g/dL (ref 13.0–17.0)
MCH: 36.5 pg — ABNORMAL HIGH (ref 26.0–34.0)
MCHC: 32.2 g/dL (ref 30.0–36.0)
MCV: 113.5 fL — ABNORMAL HIGH (ref 80.0–100.0)
Platelets: 346 10*3/uL (ref 150–400)
RBC: 3.64 MIL/uL — AB (ref 4.22–5.81)
RDW: 11.9 % (ref 11.5–15.5)
WBC: 14.8 10*3/uL — ABNORMAL HIGH (ref 4.0–10.5)
nRBC: 0 % (ref 0.0–0.2)

## 2018-06-10 LAB — APTT: aPTT: 26 seconds (ref 24–36)

## 2018-06-10 LAB — TYPE AND SCREEN
ABO/RH(D): O POS
Antibody Screen: NEGATIVE

## 2018-06-10 LAB — PROTIME-INR
INR: 0.9 (ref 0.8–1.2)
Prothrombin Time: 12.2 seconds (ref 11.4–15.2)

## 2018-06-10 SURGERY — RADIO FREQUENCY ABLATION
Anesthesia: General

## 2018-06-10 MED ORDER — FENTANYL CITRATE (PF) 100 MCG/2ML IJ SOLN
25.0000 ug | INTRAMUSCULAR | Status: DC | PRN
  Administered 2018-06-10 (×2): 50 ug via INTRAVENOUS

## 2018-06-10 MED ORDER — SENNOSIDES-DOCUSATE SODIUM 8.6-50 MG PO TABS
1.0000 | ORAL_TABLET | Freq: Every day | ORAL | Status: DC | PRN
  Filled 2018-06-10: qty 1

## 2018-06-10 MED ORDER — PIPERACILLIN-TAZOBACTAM 3.375 G IVPB 30 MIN
3.3750 g | Freq: Once | INTRAVENOUS | Status: AC
  Administered 2018-06-10: 3.375 g via INTRAVENOUS
  Filled 2018-06-10 (×2): qty 50

## 2018-06-10 MED ORDER — POTASSIUM CHLORIDE CRYS ER 10 MEQ PO TBCR
10.0000 meq | EXTENDED_RELEASE_TABLET | Freq: Every day | ORAL | Status: DC
  Administered 2018-06-11: 10 meq via ORAL
  Filled 2018-06-10: qty 1

## 2018-06-10 MED ORDER — ALBUTEROL SULFATE (2.5 MG/3ML) 0.083% IN NEBU
2.5000 mg | INHALATION_SOLUTION | Freq: Four times a day (QID) | RESPIRATORY_TRACT | Status: DC | PRN
  Administered 2018-06-11: 2.5 mg via RESPIRATORY_TRACT
  Filled 2018-06-10: qty 3

## 2018-06-10 MED ORDER — FENTANYL CITRATE (PF) 100 MCG/2ML IJ SOLN
INTRAMUSCULAR | Status: AC
  Administered 2018-06-10: 50 ug via INTRAVENOUS
  Filled 2018-06-10: qty 2

## 2018-06-10 MED ORDER — OXYCODONE HCL 5 MG PO TABS: 5.0000 mg | ORAL_TABLET | Freq: Once | ORAL | Status: DC | PRN

## 2018-06-10 MED ORDER — FLUTICASONE PROPIONATE 50 MCG/ACT NA SUSP
1.0000 | Freq: Every day | NASAL | Status: DC | PRN
  Filled 2018-06-10: qty 16

## 2018-06-10 MED ORDER — POTASSIUM 99 MG PO TABS: 1.0000 | ORAL_TABLET | Freq: Every day | ORAL | Status: DC

## 2018-06-10 MED ORDER — FLUTICASONE-UMECLIDIN-VILANT 100-62.5-25 MCG/INH IN AEPB: 1.0000 | INHALATION_SPRAY | Freq: Every day | RESPIRATORY_TRACT | Status: DC

## 2018-06-10 MED ORDER — PHENYLEPHRINE 40 MCG/ML (10ML) SYRINGE FOR IV PUSH (FOR BLOOD PRESSURE SUPPORT)
PREFILLED_SYRINGE | INTRAVENOUS | Status: DC | PRN
  Administered 2018-06-10 (×4): 80 ug via INTRAVENOUS
  Administered 2018-06-10: 100 ug via INTRAVENOUS
  Administered 2018-06-10: 120 ug via INTRAVENOUS
  Administered 2018-06-10 (×3): 80 ug via INTRAVENOUS

## 2018-06-10 MED ORDER — HYDROCODONE-ACETAMINOPHEN 5-325 MG PO TABS
1.0000 | ORAL_TABLET | ORAL | Status: DC | PRN
  Administered 2018-06-10 (×3): 1 via ORAL
  Filled 2018-06-10 (×2): qty 1

## 2018-06-10 MED ORDER — LISINOPRIL 2.5 MG PO TABS
2.5000 mg | ORAL_TABLET | Freq: Every day | ORAL | Status: DC
  Administered 2018-06-10 – 2018-06-11 (×2): 2.5 mg via ORAL
  Filled 2018-06-10 (×2): qty 1

## 2018-06-10 MED ORDER — ONDANSETRON HCL 4 MG/2ML IJ SOLN: 4.0000 mg | Freq: Four times a day (QID) | INTRAMUSCULAR | Status: DC | PRN

## 2018-06-10 MED ORDER — SODIUM CHLORIDE 0.9 % IV SOLN
INTRAVENOUS | Status: DC | PRN
  Administered 2018-06-10: 40 ug/min via INTRAVENOUS

## 2018-06-10 MED ORDER — ALBUTEROL SULFATE HFA 108 (90 BASE) MCG/ACT IN AERS: 2.0000 | INHALATION_SPRAY | Freq: Four times a day (QID) | RESPIRATORY_TRACT | Status: DC | PRN

## 2018-06-10 MED ORDER — FENTANYL CITRATE (PF) 100 MCG/2ML IJ SOLN
INTRAMUSCULAR | Status: DC | PRN
  Administered 2018-06-10 (×2): 50 ug via INTRAVENOUS

## 2018-06-10 MED ORDER — SUGAMMADEX SODIUM 200 MG/2ML IV SOLN
INTRAVENOUS | Status: DC | PRN
  Administered 2018-06-10: 200 mg via INTRAVENOUS

## 2018-06-10 MED ORDER — DOCUSATE SODIUM 100 MG PO CAPS
100.0000 mg | ORAL_CAPSULE | Freq: Two times a day (BID) | ORAL | Status: DC
  Administered 2018-06-10 – 2018-06-11 (×3): 100 mg via ORAL
  Filled 2018-06-10 (×4): qty 1

## 2018-06-10 MED ORDER — ROCURONIUM BROMIDE 10 MG/ML (PF) SYRINGE
PREFILLED_SYRINGE | INTRAVENOUS | Status: DC | PRN
  Administered 2018-06-10: 20 mg via INTRAVENOUS
  Administered 2018-06-10: 50 mg via INTRAVENOUS

## 2018-06-10 MED ORDER — HYDROMORPHONE HCL 1 MG/ML IJ SOLN: 0.5000 mg | INTRAMUSCULAR | Status: DC | PRN

## 2018-06-10 MED ORDER — HYDROCODONE-ACETAMINOPHEN 5-325 MG PO TABS
ORAL_TABLET | ORAL | Status: AC
  Filled 2018-06-10: qty 1

## 2018-06-10 MED ORDER — LACTATED RINGERS IV SOLN
INTRAVENOUS | Status: DC
  Administered 2018-06-10: 08:00:00 via INTRAVENOUS

## 2018-06-10 MED ORDER — PANTOPRAZOLE SODIUM 40 MG PO TBEC
40.0000 mg | DELAYED_RELEASE_TABLET | Freq: Every day | ORAL | Status: DC
  Administered 2018-06-11: 40 mg via ORAL
  Filled 2018-06-10: qty 1

## 2018-06-10 MED ORDER — EZETIMIBE 10 MG PO TABS
10.0000 mg | ORAL_TABLET | Freq: Every day | ORAL | Status: DC
  Administered 2018-06-10 – 2018-06-11 (×2): 10 mg via ORAL
  Filled 2018-06-10 (×2): qty 1

## 2018-06-10 MED ORDER — GLECAPREVIR-PIBRENTASVIR 100-40 MG PO TABS: 3.0000 | ORAL_TABLET | Freq: Every day | ORAL | Status: DC

## 2018-06-10 MED ORDER — ALBUTEROL SULFATE 4 MG PO TABS
4.0000 mg | ORAL_TABLET | Freq: Three times a day (TID) | ORAL | Status: DC
  Administered 2018-06-10 – 2018-06-11 (×3): 4 mg via ORAL
  Filled 2018-06-10 (×4): qty 1

## 2018-06-10 MED ORDER — FENTANYL CITRATE (PF) 100 MCG/2ML IJ SOLN
INTRAMUSCULAR | Status: AC
  Filled 2018-06-10: qty 6

## 2018-06-10 MED ORDER — NITROGLYCERIN 0.4 MG SL SUBL: 0.4000 mg | SUBLINGUAL_TABLET | SUBLINGUAL | Status: DC | PRN

## 2018-06-10 MED ORDER — BISOPROLOL FUMARATE 5 MG PO TABS
2.5000 mg | ORAL_TABLET | Freq: Every day | ORAL | Status: DC
  Administered 2018-06-11: 2.5 mg via ORAL
  Filled 2018-06-10: qty 1

## 2018-06-10 MED ORDER — ONDANSETRON HCL 4 MG/2ML IJ SOLN
INTRAMUSCULAR | Status: DC | PRN
  Administered 2018-06-10: 4 mg via INTRAVENOUS

## 2018-06-10 MED ORDER — SUCCINYLCHOLINE CHLORIDE 200 MG/10ML IV SOSY
PREFILLED_SYRINGE | INTRAVENOUS | Status: DC | PRN
  Administered 2018-06-10: 100 mg via INTRAVENOUS

## 2018-06-10 MED ORDER — FAMOTIDINE 20 MG PO TABS: 20.0000 mg | ORAL_TABLET | Freq: Every day | ORAL | Status: DC | PRN

## 2018-06-10 MED ORDER — MIDAZOLAM HCL 2 MG/2ML IJ SOLN
INTRAMUSCULAR | Status: AC
  Filled 2018-06-10: qty 2

## 2018-06-10 MED ORDER — OXYCODONE HCL 5 MG/5ML PO SOLN: 5.0000 mg | Freq: Once | ORAL | Status: DC | PRN

## 2018-06-10 MED ORDER — LIDOCAINE 2% (20 MG/ML) 5 ML SYRINGE
INTRAMUSCULAR | Status: DC | PRN
  Administered 2018-06-10: 60 mg via INTRAVENOUS

## 2018-06-10 MED ORDER — PROPOFOL 10 MG/ML IV BOLUS
INTRAVENOUS | Status: DC | PRN
  Administered 2018-06-10: 140 mg via INTRAVENOUS
  Administered 2018-06-10: 30 mg via INTRAVENOUS

## 2018-06-10 NOTE — Transfer of Care (Signed)
Immediate Anesthesia Transfer of Care Note  Patient: Adam Bass  Procedure(s) Performed: MICROWAVE THERMAL ABLATION LIVER (N/A )  Patient Location: PACU  Anesthesia Type:General  Level of Consciousness: awake, alert , oriented and patient cooperative  Airway & Oxygen Therapy: Patient Spontanous Breathing and Patient connected to nasal cannula oxygen  Post-op Assessment: Report given to RN and Post -op Vital signs reviewed and stable  Post vital signs: Reviewed and stable  Last Vitals:  Vitals Value Taken Time  BP 82/62 06/10/2018 10:55 AM  Temp    Pulse 68 06/10/2018 10:57 AM  Resp 16 06/10/2018 10:57 AM  SpO2 100 % 06/10/2018 10:57 AM  Vitals shown include unvalidated device data.  Last Pain:  Vitals:   06/10/18 0748  PainSc: 0-No pain         Complications: No apparent anesthesia complications

## 2018-06-10 NOTE — Sedation Documentation (Signed)
Anesthesia in to sedate and monitor. 

## 2018-06-10 NOTE — Anesthesia Postprocedure Evaluation (Signed)
Anesthesia Post Note  Patient: Adam Bass  Procedure(s) Performed: MICROWAVE THERMAL ABLATION LIVER (N/A )     Patient location during evaluation: PACU Anesthesia Type: General Level of consciousness: awake and alert Pain management: pain level controlled Vital Signs Assessment: post-procedure vital signs reviewed and stable Respiratory status: spontaneous breathing, nonlabored ventilation, respiratory function stable and patient connected to nasal cannula oxygen Cardiovascular status: blood pressure returned to baseline and stable Postop Assessment: no apparent nausea or vomiting Anesthetic complications: no    Last Vitals:  Vitals:   06/10/18 1400 06/10/18 1456  BP: (!) 96/51 115/72  Pulse: 67 69  Resp: 13 18  Temp: 36.9 C 36.7 C  SpO2: 100% 100%    Last Pain:  Vitals:   06/10/18 1456  TempSrc: Oral  PainSc:                  [place]

## 2018-06-10 NOTE — Anesthesia Procedure Notes (Signed)
Procedure Name: Intubation Date/Time: 06/10/2018 9:07 AM Performed by: Claudia Desanctis, CRNA Pre-anesthesia Checklist: Patient identified, Emergency Drugs available, Suction available and Patient being monitored Patient Re-evaluated:Patient Re-evaluated prior to induction Oxygen Delivery Method: Circle system utilized Preoxygenation: Pre-oxygenation with 100% oxygen Induction Type: IV induction Ventilation: Mask ventilation without difficulty Laryngoscope Size: 2 and Miller Grade View: Grade I Tube type: Oral Tube size: 7.5 mm Number of attempts: 1 Airway Equipment and Method: Stylet Placement Confirmation: ETT inserted through vocal cords under direct vision,  positive ETCO2 and breath sounds checked- equal and bilateral Secured at: 21 cm Tube secured with: Tape Dental Injury: Teeth and Oropharynx as per pre-operative assessment

## 2018-06-10 NOTE — Anesthesia Preprocedure Evaluation (Signed)
Anesthesia Evaluation  Patient identified by MRN, date of birth, ID band Patient awake    Reviewed: Allergy & Precautions, H&P , NPO status , Patient's Chart, lab work & pertinent test results  Airway Mallampati: II   Neck ROM: full    Dental   Pulmonary COPD, Current Smoker,  H/o lunCA   breath sounds clear to auscultation       Cardiovascular hypertension, + CAD, + Past MI and + Cardiac Stents   Rhythm:regular Rate:Normal     Neuro/Psych    GI/Hepatic (+) Hepatitis -Liver lesion   Endo/Other    Renal/GU      Musculoskeletal   Abdominal   Peds  Hematology   Anesthesia Other Findings   Reproductive/Obstetrics                             Anesthesia Physical Anesthesia Plan  ASA: III  Anesthesia Plan: General   Post-op Pain Management:    Induction: Intravenous  PONV Risk Score and Plan: 1 and Ondansetron and Treatment may vary due to age or medical condition  Airway Management Planned: Oral ETT  Additional Equipment:   Intra-op Plan:   Post-operative Plan: Extubation in OR  Informed Consent: I have reviewed the patients History and Physical, chart, labs and discussed the procedure including the risks, benefits and alternatives for the proposed anesthesia with the patient or authorized representative who has indicated his/her understanding and acceptance.       Plan Discussed with: CRNA, Anesthesiologist and Surgeon  Anesthesia Plan Comments:         Anesthesia Quick Evaluation

## 2018-06-10 NOTE — Procedures (Signed)
Interventional Radiology Procedure Note  Procedure: CT and US guided thermal ablation of hepatocellular carcinoma   Anesthesia: General  Complications: None  Estimated Blood Loss: < 10 mL  Findings: Under US guidance, single NeuWave XT probe advanced into left lobe HCC measuring approximately 2.4 x 1.5 x 2.0 cm by [place].  Tract cauterized.  Plan: PACU recovery followed by overnight observation.  Venetia Night. Kathlene Cote, M.D Pager:  [phone]

## 2018-06-10 NOTE — H&P (Signed)
Referring Physician(s): Rehman,N  Supervising Physician: Aletta Edouard  Patient Status:  [facility] OP TBA  Chief Complaint: Hepatocellular carcinoma   Subjective: Patient familiar to IR service from prior left upper lobe lung nodule biopsy on 10/18/2016 as well as left lower lobe lung nodule biopsy on 04/10/2017.  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  He was seen in consultation by Dr. Kathlene Cote on 03/13/2018 to discuss treatment options for [facility] and deemed an appropriate candidate for CT-guided microwave/thermal ablation of a 2.6 cm left hepatic lobe HCC.  Patient on 05/27/2022 above procedure however was not deemed stable from respiratory standpoint for intubation therefore was rescheduled for today.  He currently denies fever, headache, chest pain, back pain, nausea, vomiting or bleeding.  He does have some occasional indigestion, dyspnea with exertion, occasional cough and some abdominal fullness.  Past Medical History:  Diagnosis Date  . Anticoagulant long-term use    brillinta  . Bilateral lower extremity edema    feet, noticed since discharge from hospital admission 05-18-2018, "i was laid up in the bed for five days"   . CAD (coronary artery disease) cardiologist-- dr j. branch   hx STEMI 11-23-2017,  s/p  cardiac cath with Thrombectomy, PCI and DES x1 to midRCA, and other nonobstrucitve CAD involving pLAD, OM1, dRCA, posterior atrio, LVEF 45-50%,  LVEDP 61mHg  . COPD (chronic obstructive pulmonary disease) with emphysema ([facility]    pulmonology-- dr wert-- GOLD III mix (pt still smokes),  last exacerbation -due to the flu , [facility]  . DOE (dyspnea on exertion)   . Ectatic abdominal aorta ([place]    MRI 11/ 2019  . Hepatitis C GI-- dr rLaural Golden  dx  02-02-2018--- currently treated on antiviral medication  . [place]atocellular [place]   . History of radiation therapy 06/03/17- 06/09/17   Left Lung treated to 54 Gy with 3 fx of 18 Gy. SBRT  . History of skin cancer    04/ 2017 excision from face  . History of ST elevation myocardial infarction (STEMI) 11/23/2017   inferior wall-- s/p cardiac cath w/ thrombectomy, PCI, and DES  . HLD (hyperlipidemia)   . Hypertension   . Primary squamous cell carcinoma of left lung ([facility] oncologist-- dr sIsidore Moos  dx 02/ 2018-- Stage IA2 Left lower lung,  s/p  Stereotactic radiation completed 06-09-2017  . Productive cough    per pt mostly clear  . Requires supplemental oxygen    06-09-2018 currently pt is using a portable O2 as needed and at night  . S/P drug eluting coronary stent placement 11/23/2017   DES x1 to midRCA  . Urinary incontinence    wear depends  . Wears dentures    fuller upper,  lower partial  . Wears glasses    Past Surgical History:  Procedure Laterality Date  . COLONOSCOPY N/A 08/27/2012   Procedure: COLONOSCOPY;  Surgeon: NRogene Houston MD;  [facility];  Service: Endoscopy;  Laterality: N/A;  1200  . CORONARY STENT INTERVENTION N/A 11/23/2017   Procedure: CORONARY STENT INTERVENTION;  Surgeon: VJettie Booze MD;  [facility];  Service: Cardiovascular;  Laterality: N/A;  . IR RADIOLOGIST EVAL & MGMT  03/04/2018  . LEFT HEART CATH AND CORONARY ANGIOGRAPHY  N/A 11/23/2017   Procedure: LEFT HEART CATH AND CORONARY ANGIOGRAPHY;  Surgeon: Jettie Booze, MD;  Location: [place] [facility] LAB;  Service: Cardiovascular;  Laterality: N/A;       Allergies: Bee venom  Medications: Prior to Admission medications   Medication Sig Start Date End Date Taking? Authorizing Provider  albuterol (PROAIR HFA) 108 (90 Base) MCG/ACT inhaler Inhale 2 puffs into the lungs every 6 (six) hours as needed for wheezing.    Yes [provider]    albuterol (PROVENTIL) 4 MG tablet Take 4 mg by mouth 3 (three) times daily.   Yes [provider]  aspirin EC 81 MG tablet Take 81 mg by mouth daily. Take with brilanta   Yes [provider]  bisoprolol (ZEBETA) 5 MG tablet Take 0.5 tablets (2.5 mg total) by mouth daily. 11/28/17  Yes [name] B, NP  Blood Pressure KIT 1 Units by Does not apply route daily. 11/27/17  Yes Cheryln Manly, NP  ezetimibe (ZETIA) 10 MG tablet Take 1 tablet (10 mg total) by mouth daily. 11/27/17 06/10/18 Yes Cheryln Manly, NP  famotidine (PEPCID) 20 MG tablet Take 20 mg by mouth daily as needed for heartburn or indigestion.   Yes [provider]  fluticasone (FLONASE) 50 MCG/ACT nasal spray Place 1 spray into both nostrils daily as needed for allergies.    Yes [provider]  Glecaprevir-Pibrentasvir (MAVYRET) 100-40 MG TABS Take 3 tablets by mouth daily.    Yes [provider]  lisinopril (PRINIVIL,ZESTRIL) 2.5 MG tablet Take 1 tablet (2.5 mg total) by mouth daily. 04/20/18 07/19/18 Yes BranchAlphonse Guild, MD  Multiple Vitamins-Minerals (MULTIVITAMIN WITH MINERALS) tablet Take 1 tablet by mouth daily.   Yes [provider]  OXYGEN Inhale into the lungs as needed. 4 Liters supplemental o2 , as needed "when im up moving around"   Yes [provider]  pantoprazole (PROTONIX) 40 MG tablet Take 40 mg by mouth daily.  02/02/18  Yes [provider]  Potassium 99 MG TABS Take 1 tablet by mouth daily.    Yes [provider]  Tetrahydrozoline HCl (VISINE OP) Place 1 drop into both eyes daily as needed ([place]).   Yes [provider]  TRELEGY ELLIPTA 100-62.5-25 MCG/INH AEPB INHALE 1 PUFF INTO THE LUNGS EVERY DAY Patient taking differently: Inhale 1 puff into the lungs daily.  12/24/17  Yes Tanda Rockers, MD  vitamin E 400 UNIT capsule Take 1 tablet by mouth daily.    Yes [provider]  nitroGLYCERIN (NITROSTAT) 0.4  MG SL tablet Place 1 tablet (0.4 mg total) under the tongue every 5 (five) minutes as needed. 12/11/17   [name], NP  ticagrelor (BRILINTA) 90 MG TABS tablet Take 1 tablet (90 mg total) by mouth 2 (two) times daily. 11/27/17   Cheryln Manly, NP     Vital Signs: Blood pressure 107/57, heart rate 73, temp 97.8, respirations 20, O2 sat 96% room air Ht 5' 9"  (1.753 m)   Wt 144 lb (65.3 kg)   BMI 21.27 kg/m   Physical Exam awake, alert.  Chest with distant breath sounds bilaterally, occ wheeze noted right upper lung field.  Heart with regular rate and rhythm.  Abdomen soft, positive bowel sounds, some mild generalized tenderness to palpation.  No lower extremity edema.  Imaging: No results found.  Labs:  CBC: Recent Labs    05/13/18 1904 05/22/18 1413 05/27/18 0736 06/10/18 0740  WBC 11.2*  19.9* 17.8* 14.8*  HGB 16.7 13.1 12.3* 13.3  HCT 49.1 37.9* 37.6* 41.3  PLT 290 289 266 346    COAGS: Recent Labs    11/23/17 1532 02/20/18 1051 05/27/18 0736  INR 1.17 1.1 0.98  APTT 140*  --   --     BMP: Recent Labs    05/13/18 1904 05/22/18 1413 05/27/18 0736 06/09/18 1455  NA 133* 134* 136 138  K 3.5 4.7 3.7 3.6  CL 100 98 99 99  CO2 24 29 30  32  GLUCOSE 163* 116* 96 148*  BUN 14 18 21 22   CALCIUM 8.6* 8.9 7.9* 8.5*  CREATININE 0.79 0.64 0.59* 0.82  GFRNONAA >60 >60 >60 >60  GFRAA >60 >60 >60 >60    LIVER FUNCTION TESTS: Recent Labs    05/13/18 1904 05/22/18 1413 05/27/18 0736 06/09/18 1455  BILITOT 1.8* 1.4* 1.1 0.8  AST 36 32 27 28  ALT 35 46* 34 34  ALKPHOS 97 60 82 71  PROT 8.5* 6.5 6.5 7.3  ALBUMIN 4.3 3.6 3.4* 4.1    Assessment and Plan: Pt with history of COPD, long-term tobacco abuse, 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  He was seen in consultation by Dr. Kathlene Cote on 03/13/2018 to  discuss treatment options for [facility] and deemed an appropriate candidate for CT-guided microwave/thermal ablation of a 2.6 cm left hepatic lobe HCC.  He presents today for the procedure.  Details/risks of procedure, including but not limited to, internal bleeding, infection, injury to adjacent structures, anesthesia related complications discussed with patient/ex wife with their understanding and consent.This procedure involves the use of X-rays and because of the nature of the planned procedure, it is possible that we will have prolonged use of X-ray fluoroscopy.  Potential radiation risks to you include (but are not limited to) the following: - A slightly elevated risk for cancer                    several years later in life. This risk is typically less than 0.5% percent. This risk is low in comparison to the normal incidence of human cancer, which is 33% for women and 50% for men according to the [employer]. - Radiation induced injury can include skin redness, resembling a rash, tissue breakdown / ulcers and hair loss (which can be temporary or permanent).   The likelihood of either of these occurring depends on the difficulty of the procedure and whether you are sensitive to radiation due to previous procedures, disease, or genetic conditions.   IF your procedure requires a prolonged use of radiation, you will be notified and given written instructions for further action.  It is your responsibility to monitor the irradiated area for the 2 weeks following the procedure and to notify your physician if you are concerned that you have suffered a radiation induced injury.    Post procedure he will be admitted for overnight observation.   Electronically Signed: D. Rowe Robert, PA-C 06/10/2018, 8:07 AM   I spent a total of 30 minutes at the the patient's bedside AND on the patient's hospital floor or unit, greater than 50% of which was counseling/coordinating care for CT-guided  microwave ablation/possible biopsy of left hepatic lobe tumor/hepatocellular carcinoma

## 2018-06-10 NOTE — Progress Notes (Addendum)
Patient ID: OLAJUWON FOSDICK, male   DOB: 1950/12/12, 68 y.o.   MRN: [id number] Pt doing ok; has some mild RUQ discomfort after coughing; has tolerated diet; denies N/V or resp difficulties VSS; AF Puncture site RUQ abd clean and dry, mildly tender, no visible bleeding  A/P: Pt with hx cirrhosis, hep C, HCC; s/p thermal ablation left hepatic lobe HCC earlier today; for overnight obs; check am labs; f/u with Dr. Kathlene Cote in [place] clinic in 4 weeks  Rowe Robert, [place] [facility]

## 2018-06-11 ENCOUNTER — Encounter (HOSPITAL_COMMUNITY): Payer: Self-pay | Admitting: Interventional Radiology

## 2018-06-11 DIAGNOSIS — B1920 Unspecified viral hepatitis C without hepatic coma: Secondary | ICD-10-CM | POA: Diagnosis not present

## 2018-06-11 DIAGNOSIS — E785 Hyperlipidemia, unspecified: Secondary | ICD-10-CM | POA: Diagnosis not present

## 2018-06-11 DIAGNOSIS — Z85828 Personal history of other malignant neoplasm of skin: Secondary | ICD-10-CM | POA: Diagnosis not present

## 2018-06-11 DIAGNOSIS — Z7982 Long term (current) use of aspirin: Secondary | ICD-10-CM | POA: Diagnosis not present

## 2018-06-11 DIAGNOSIS — I252 Old myocardial infarction: Secondary | ICD-10-CM | POA: Diagnosis not present

## 2018-06-11 DIAGNOSIS — C3482 Malignant neoplasm of overlapping sites of left bronchus and lung: Secondary | ICD-10-CM | POA: Diagnosis not present

## 2018-06-11 DIAGNOSIS — I10 Essential (primary) hypertension: Secondary | ICD-10-CM | POA: Diagnosis not present

## 2018-06-11 DIAGNOSIS — Z79899 Other long term (current) drug therapy: Secondary | ICD-10-CM | POA: Diagnosis not present

## 2018-06-11 DIAGNOSIS — J449 Chronic obstructive pulmonary disease, unspecified: Secondary | ICD-10-CM | POA: Diagnosis not present

## 2018-06-11 DIAGNOSIS — Z7902 Long term (current) use of antithrombotics/antiplatelets: Secondary | ICD-10-CM | POA: Diagnosis not present

## 2018-06-11 DIAGNOSIS — F17200 Nicotine dependence, unspecified, uncomplicated: Secondary | ICD-10-CM | POA: Diagnosis not present

## 2018-06-11 DIAGNOSIS — K7460 Unspecified cirrhosis of liver: Secondary | ICD-10-CM | POA: Diagnosis not present

## 2018-06-11 DIAGNOSIS — C220 Liver cell carcinoma: Secondary | ICD-10-CM | POA: Diagnosis not present

## 2018-06-11 DIAGNOSIS — Z7951 Long term (current) use of inhaled steroids: Secondary | ICD-10-CM | POA: Diagnosis not present

## 2018-06-11 DIAGNOSIS — Z923 Personal history of irradiation: Secondary | ICD-10-CM | POA: Diagnosis not present

## 2018-06-11 DIAGNOSIS — I2510 Atherosclerotic heart disease of native coronary artery without angina pectoris: Secondary | ICD-10-CM | POA: Diagnosis not present

## 2018-06-11 LAB — CBC
HCT: 35.3 % — ABNORMAL LOW (ref 39.0–52.0)
Hemoglobin: 11.3 g/dL — ABNORMAL LOW (ref 13.0–17.0)
MCH: 36.7 pg — ABNORMAL HIGH (ref 26.0–34.0)
MCHC: 32 g/dL (ref 30.0–36.0)
MCV: 114.6 fL — ABNORMAL HIGH (ref 80.0–100.0)
Platelets: 246 10*3/uL (ref 150–400)
RBC: 3.08 MIL/uL — ABNORMAL LOW (ref 4.22–5.81)
RDW: 12.2 % (ref 11.5–15.5)
WBC: 12 10*3/uL — ABNORMAL HIGH (ref 4.0–10.5)
nRBC: 0 % (ref 0.0–0.2)

## 2018-06-11 LAB — COMPREHENSIVE METABOLIC PANEL
ALT: 121 U/L — ABNORMAL HIGH (ref 0–44)
AST: 144 U/L — AB (ref 15–41)
Albumin: 3.1 g/dL — ABNORMAL LOW (ref 3.5–5.0)
Alkaline Phosphatase: 71 U/L (ref 38–126)
Anion gap: 5 (ref 5–15)
BUN: 23 mg/dL (ref 8–23)
CO2: 32 mmol/L (ref 22–32)
Calcium: 8.1 mg/dL — ABNORMAL LOW (ref 8.9–10.3)
Chloride: 103 mmol/L (ref 98–111)
Creatinine, Ser: 0.81 mg/dL (ref 0.61–1.24)
GFR calc Af Amer: >60 mL/min (ref >60)
Glucose, Bld: 95 mg/dL (ref 70–99)
Potassium: 3.9 mmol/L (ref 3.5–5.1)
Sodium: 140 mmol/L (ref 135–145)
Total Bilirubin: 0.8 mg/dL (ref 0.3–1.2)
Total Protein: 5.9 g/dL — ABNORMAL LOW (ref 6.5–8.1)

## 2018-06-11 MED ORDER — OXYCODONE-ACETAMINOPHEN 2.5-325 MG PO TABS: 1.0000 | ORAL_TABLET | Freq: Four times a day (QID) | ORAL | 0 refills | Status: DC | PRN

## 2018-06-11 NOTE — Progress Notes (Signed)
Pt d/c home with a family member. He verbalized understanding of d/c instuctions. He was transported out in a wheelchair

## 2018-06-11 NOTE — Discharge Summary (Signed)
Patient ID: Adam Bass MRN: [id number] DOB/AGE: 1950/09/01 68 y.o.  Admit date: 06/10/2018 Discharge date: 06/11/2018  Supervising Physician: Jacqulynn Cadet  Patient Status: [facility] - In-pt  Admission Diagnoses: Hepatocellular carcinoma  Discharge Diagnoses:  Active Problems:   Hepatocellular carcinoma [facility])   Discharged Condition: stable  Hospital Course:  Patient presented to [facility] 06/10/2018 for an image-guided percutaneous thermal ablation of left lobe hepatocellular carcinoma by Dr. Kathlene Cote. Procedure occurred without major complications and patient was transferred to floor for overnight observation. No major events occurred overnight.  Patient awake and alert laying in bed. Complains of abdominal pain at incision site. Plan to discharge home today and follow-up with Dr. Kathlene Cote in clinic 4 weeks after discharge. Prescription for Percocet 2.5-325 mg tablets sent to patient pharmacy, patient to take one tablet by mouth every 6 hours as needed for pain.   Consults: None  Significant Diagnostic Studies: X-ray Chest Pa Or Ap  Result Date: 06/10/2018 CLINICAL DATA:  Hepatocellular carcinoma and recent COPD exacerbation. Preoperative respiratory exam prior to planned thermal ablation of hepatocellular carcinoma under general anesthesia on 06/10/2018. EXAM: CHEST  1 VIEW COMPARISON:  05/13/2018 FINDINGS: The heart size and mediastinal contours are within normal limits. Slightly diminished hyperinflation bilaterally compared to the prior examination. Stable chronic emphysema. There is no evidence of pulmonary edema, consolidation, pneumothorax, nodule or pleural fluid. Stable scarring at both lung apices. The visualized skeletal structures are unremarkable. IMPRESSION: Less prominent hyperinflation bilaterally compared to the prior chest x-ray with stable chronic emphysema. Electronically Signed   By: Aletta Edouard M.D.   On: 06/10/2018 09:01   Ct Guide Tissue  Ablation  Result Date: 06/10/2018 CLINICAL DATA:  Cirrhosis, hepatitis C, elevated AFP and 2.6 cm mass of the left lobe of the liver meeting imaging criteria for hepatocellular carcinoma. The patient presents for percutaneous ablation of the hepatocellular carcinoma. EXAM: CT-GUIDED PERCUTANEOUS THERMAL ABLATION OF LIVER COMPARISON:  MRI of the abdomen on 02/17/2018 ANESTHESIA/SEDATION: Anesthesia:  General Medications: 3.375 g IV Zosyn. The antibiotic was administered in an appropriate time interval prior to needle puncture of the skin. CONTRAST:  None. PROCEDURE: The procedure, risks, benefits, and alternatives were explained to the patient. Questions regarding the procedure were encouraged and answered. The patient understands and consents to the procedure. The patient was placed under general anesthesia. Initial unenhanced CT was performed in a supine position to localize the liver. The lesion in the left lobe of the liver was also localized and characterized by ultrasound. The anterior abdominal wall was prepped with chlorhexidine in a sterile fashion, and a sterile drape was applied covering the operative field. A sterile gown and sterile gloves were used for the procedure. A time-out was performed prior to initiating the procedure. Under ultrasound guidance, a NeuWave XT percutaneous thermal ablation probe was advanced into the left hepatic mass. Probe positioning was confirmed by ultrasound and CT prior to ablation. Thermal ablation was performed for 10 minutes at 65 watts. Ablation was monitored under ultrasound. Post-procedural CT was performed after completion of the 10 minutes cycle. Tract cautery was then performed as the probe was retracted under ultrasound guidance. COMPLICATIONS: None FINDINGS: The ovoid shaped mass in the left lobe of the liver was well visualized by ultrasound and measures approximately 2.4 x 1.5 x 2.0 cm. With the probe position through the center of the lesion and just through  the back wall, thermal ablation was performed resulting in adequate distribution micro bubbles under ultrasound guidance and visualization of what appear  to be inadequate ablation zone by CT. IMPRESSION: CT guided percutaneous thermal ablation of left lobe hepatocellular carcinoma. The patient will be observed overnight. Initial follow-up will be performed in approximately 4 weeks. Electronically Signed   By: Aletta Edouard M.D.   On: 06/10/2018 11:47   Dg Chest Portable 1 View  Result Date: 05/13/2018 CLINICAL DATA:  Shortness of breath, lung cancer, smoker, emphysema EXAM: PORTABLE CHEST 1 VIEW COMPARISON:  03/05/2018 FINDINGS: Hyperinflation noted compatible with background COPD/emphysema. Left upper lobe nodularity and bandlike scarring noted, better demonstrated by comparison CT. Additional nodules by CT in the left lung are not well appreciated by plain radiography. No superimposed pneumonia, significant collapse or consolidation. Negative for edema, large effusion or pneumothorax. Trachea midline. Normal heart size and vascularity. Aorta atherosclerotic. IMPRESSION: Hyperinflation compatible with COPD/emphysema Left upper lobe nodularity and adjacent scarring. No significant interval change or superimposed acute process by plain radiography. Electronically Signed   By: Jerilynn Mages.  Shick M.D.   On: 05/13/2018 19:40    Treatments: Percutaneous thermal ablation of left lobe hepatocellular carcinoma  Discharge Exam: Blood pressure 115/72, pulse 69, temperature 98 F (36.7 C), temperature source Oral, resp. rate 18, height '5\' 9"'$  (1.753 m), weight 147 lb (66.7 kg), SpO2 100 %. Physical Exam Vitals signs and nursing note reviewed.  Constitutional:      General: He is not in acute distress.    Appearance: Normal appearance.  Cardiovascular:     Rate and Rhythm: Normal rate and regular rhythm.     Heart sounds: Normal heart sounds. No murmur.  Pulmonary:     Effort: Pulmonary effort is normal. No  respiratory distress.     Breath sounds: Normal breath sounds. No wheezing.  Abdominal:     Palpations: Abdomen is soft.     Comments: RUQ incision site with mild tenderness, no erythema, bleeding, or drainage noted.  Skin:    General: Skin is warm and dry.  Neurological:     Mental Status: He is alert and oriented to person, place, and time.  Psychiatric:        Mood and Affect: Mood normal.        Behavior: Behavior normal.        Thought Content: Thought content normal.        Judgment: Judgment normal.     Disposition: Discharge disposition: 01-Home or Self Care       Discharge Instructions    Call MD for:  difficulty breathing, headache or visual disturbances   Complete by:  As directed    Call MD for:  extreme fatigue   Complete by:  As directed    Call MD for:  hives   Complete by:  As directed    Call MD for:  persistant dizziness or light-headedness   Complete by:  As directed    Call MD for:  persistant nausea and vomiting   Complete by:  As directed    Call MD for:  redness, tenderness, or signs of infection (pain, swelling, redness, odor or green/yellow discharge around incision site)   Complete by:  As directed    Call MD for:  severe uncontrolled pain   Complete by:  As directed    Call MD for:  temperature >100.4   Complete by:  As directed    Diet - low sodium heart healthy   Complete by:  As directed    Increase activity slowly   Complete by:  As directed      Allergies  as of 06/11/2018      Reactions   Bee Venom Hives      Medication List    TAKE these medications   aspirin EC 81 MG tablet Take 81 mg by mouth daily. Take with brilanta   bisoprolol 5 MG tablet Commonly known as:  ZEBETA Take 0.5 tablets (2.5 mg total) by mouth daily.   Blood Pressure Kit 1 Units by Does not apply route daily.   ezetimibe 10 MG tablet Commonly known as:  ZETIA Take 1 tablet (10 mg total) by mouth daily.   famotidine 20 MG tablet Commonly known as:   PEPCID Take 20 mg by mouth daily as needed for heartburn or indigestion.   fluticasone 50 MCG/ACT nasal spray Commonly known as:  FLONASE Place 1 spray into both nostrils daily as needed for allergies.   lisinopril 2.5 MG tablet Commonly known as:  PRINIVIL,ZESTRIL Take 1 tablet (2.5 mg total) by mouth daily.   MAVYRET 100-40 MG Tabs Generic drug:  Glecaprevir-Pibrentasvir Take 3 tablets by mouth daily.   multivitamin with minerals tablet Take 1 tablet by mouth daily.   nitroGLYCERIN 0.4 MG SL tablet Commonly known as:  NITROSTAT Place 1 tablet (0.4 mg total) under the tongue every 5 (five) minutes as needed.   oxycodone-acetaminophen 2.5-325 MG tablet Commonly known as:  PERCOCET Take 1 tablet by mouth every 6 (six) hours as needed for pain.   OXYGEN Inhale into the lungs as needed. 4 Liters supplemental o2 , as needed "when im up moving around"   pantoprazole 40 MG tablet Commonly known as:  PROTONIX Take 40 mg by mouth daily.   Potassium 99 MG Tabs Take 1 tablet by mouth daily.   albuterol 4 MG tablet Commonly known as:  PROVENTIL Take 4 mg by mouth 3 (three) times daily.   PROAIR HFA 108 (90 Base) MCG/ACT inhaler Generic drug:  albuterol Inhale 2 puffs into the lungs every 6 (six) hours as needed for wheezing.   ticagrelor 90 MG Tabs tablet Commonly known as:  BRILINTA Take 1 tablet (90 mg total) by mouth 2 (two) times daily.   TRELEGY ELLIPTA 100-62.5-25 MCG/INH Aepb Generic drug:  Fluticasone-Umeclidin-Vilant INHALE 1 PUFF INTO THE LUNGS EVERY DAY What changed:  See the new instructions.   VISINE OP Place 1 drop into both eyes daily as needed (Dry Eyes).   vitamin E 400 UNIT capsule Take 1 tablet by mouth daily.         Electronically Signed: Earley Abide, PA-C 06/11/2018, 9:50 AM   I have spent Less Than 30 Minutes discharging Adam Bass.

## 2018-06-12 ENCOUNTER — Other Ambulatory Visit: Payer: Self-pay | Admitting: Student

## 2018-06-12 ENCOUNTER — Other Ambulatory Visit (HOSPITAL_COMMUNITY): Payer: Self-pay | Admitting: Student

## 2018-06-12 DIAGNOSIS — J9601 Acute respiratory failure with hypoxia: Secondary | ICD-10-CM | POA: Diagnosis not present

## 2018-06-12 DIAGNOSIS — B1920 Unspecified viral hepatitis C without hepatic coma: Secondary | ICD-10-CM | POA: Diagnosis not present

## 2018-06-12 DIAGNOSIS — J09X2 Influenza due to identified novel influenza A virus with other respiratory manifestations: Secondary | ICD-10-CM | POA: Diagnosis not present

## 2018-06-12 DIAGNOSIS — C3432 Malignant neoplasm of lower lobe, left bronchus or lung: Secondary | ICD-10-CM | POA: Diagnosis not present

## 2018-06-12 DIAGNOSIS — I10 Essential (primary) hypertension: Secondary | ICD-10-CM | POA: Diagnosis not present

## 2018-06-12 DIAGNOSIS — E785 Hyperlipidemia, unspecified: Secondary | ICD-10-CM | POA: Diagnosis not present

## 2018-06-12 DIAGNOSIS — I2510 Atherosclerotic heart disease of native coronary artery without angina pectoris: Secondary | ICD-10-CM | POA: Diagnosis not present

## 2018-06-12 DIAGNOSIS — J441 Chronic obstructive pulmonary disease with (acute) exacerbation: Secondary | ICD-10-CM | POA: Diagnosis not present

## 2018-06-12 MED ORDER — HYDROCODONE-IBUPROFEN 5-200 MG PO TABS: 1.0000 | ORAL_TABLET | Freq: Three times a day (TID) | ORAL | 0 refills | Status: DC | PRN

## 2018-06-16 DIAGNOSIS — I2510 Atherosclerotic heart disease of native coronary artery without angina pectoris: Secondary | ICD-10-CM | POA: Diagnosis not present

## 2018-06-16 DIAGNOSIS — J09X2 Influenza due to identified novel influenza A virus with other respiratory manifestations: Secondary | ICD-10-CM | POA: Diagnosis not present

## 2018-06-16 DIAGNOSIS — J441 Chronic obstructive pulmonary disease with (acute) exacerbation: Secondary | ICD-10-CM | POA: Diagnosis not present

## 2018-06-16 DIAGNOSIS — C3432 Malignant neoplasm of lower lobe, left bronchus or lung: Secondary | ICD-10-CM | POA: Diagnosis not present

## 2018-06-16 DIAGNOSIS — I10 Essential (primary) hypertension: Secondary | ICD-10-CM | POA: Diagnosis not present

## 2018-06-16 DIAGNOSIS — E785 Hyperlipidemia, unspecified: Secondary | ICD-10-CM | POA: Diagnosis not present

## 2018-06-16 DIAGNOSIS — B1920 Unspecified viral hepatitis C without hepatic coma: Secondary | ICD-10-CM | POA: Diagnosis not present

## 2018-06-16 DIAGNOSIS — J9601 Acute respiratory failure with hypoxia: Secondary | ICD-10-CM | POA: Diagnosis not present

## 2018-06-17 DIAGNOSIS — J441 Chronic obstructive pulmonary disease with (acute) exacerbation: Secondary | ICD-10-CM | POA: Diagnosis not present

## 2018-06-18 DIAGNOSIS — J441 Chronic obstructive pulmonary disease with (acute) exacerbation: Secondary | ICD-10-CM | POA: Diagnosis not present

## 2018-06-19 ENCOUNTER — Ambulatory Visit (HOSPITAL_COMMUNITY)
Admission: RE | Admit: 2018-06-19 | Discharge: 2018-06-19 | Disposition: A | Discharge status: Home / Self Care | Payer: BLUE CROSS/BLUE SHIELD | Source: Ambulatory Visit | Attending: Radiation Oncology | Admitting: Radiation Oncology

## 2018-06-19 DIAGNOSIS — C3432 Malignant neoplasm of lower lobe, left bronchus or lung: Secondary | ICD-10-CM | POA: Diagnosis not present

## 2018-06-19 DIAGNOSIS — C3490 Malignant neoplasm of unspecified part of unspecified bronchus or lung: Secondary | ICD-10-CM | POA: Diagnosis not present

## 2018-06-22 ENCOUNTER — Ambulatory Visit (INDEPENDENT_AMBULATORY_CARE_PROVIDER_SITE_OTHER): Payer: BLUE CROSS/BLUE SHIELD | Admitting: Internal Medicine

## 2018-06-22 ENCOUNTER — Encounter (INDEPENDENT_AMBULATORY_CARE_PROVIDER_SITE_OTHER): Payer: Self-pay | Admitting: Internal Medicine

## 2018-06-22 VITALS — BP 123/74 | HR 92 | Temp 98.4°F | Resp 18 | Ht 69.0 in | Wt 139.3 lb

## 2018-06-22 DIAGNOSIS — B182 Chronic viral hepatitis C: Secondary | ICD-10-CM | POA: Diagnosis not present

## 2018-06-22 DIAGNOSIS — C220 Liver cell carcinoma: Secondary | ICD-10-CM | POA: Diagnosis not present

## 2018-06-22 NOTE — Patient Instructions (Signed)
Physician will call with results of blood test when completed. 

## 2018-06-22 NOTE — Progress Notes (Signed)
Presenting complaint;  Follow-up for chronic hepatitis C/cirrhosis and HCC.  Database and subjective:  Patient is 67 year old Caucasian male with significant comorbidities who was diagnosed with cirrhosis secondary to hepatitis C last year and he was also found to have 4 hepatic lesions 1 of which in the left lobe met criterion for [facility]. It was decided to treat patient with Mavyret(Glecaprevir '100mg'$  and Pibrentasvir 40 mg).  He finished therapy on 06/10/2018. Patient had ablation of left lobe [place] on 06/10/2018 by Dr. Kathlene Cote.  Said he had local pain for few days but it has resolved. Patient says he was also hospitalized in January this year for 5 days for flu.  He has been on oxygen ever since but now using as needed. He has lost over 20 pounds since he suffered MI back in August 2019.  His appetite is good.  He states weight loss has leveled off.  He denies nausea vomiting melena or rectal bleeding. Continues to smoke cigarettes.  He is smoking less than a pack per day.   Current Medications: Outpatient Encounter Medications as of 06/22/2018  Medication Sig  . albuterol (PROAIR HFA) 108 (90 Base) MCG/ACT inhaler Inhale 2 puffs into the lungs every 6 (six) hours as needed for wheezing.   Marland Kitchen albuterol (PROVENTIL) 4 MG tablet Take 4 mg by mouth 3 (three) times daily.  Marland Kitchen aspirin EC 81 MG tablet Take 81 mg by mouth daily. Take with brilanta  . bisoprolol (ZEBETA) 5 MG tablet Take 0.5 tablets (2.5 mg total) by mouth daily.  . Blood Pressure KIT 1 Units by Does not apply route daily.  Marland Kitchen ezetimibe (ZETIA) 10 MG tablet Take 1 tablet (10 mg total) by mouth daily.  . famotidine (PEPCID) 20 MG tablet Take 20 mg by mouth daily as needed for heartburn or indigestion.  . fluticasone (FLONASE) 50 MCG/ACT nasal spray Place 1 spray into both nostrils daily as needed for allergies.   Marland Kitchen lisinopril (PRINIVIL,ZESTRIL) 2.5 MG tablet Take 1 tablet (2.5 mg total) by mouth daily.  . Multiple Vitamins-Minerals  (MULTIVITAMIN WITH MINERALS) tablet Take 1 tablet by mouth daily.  . nitroGLYCERIN (NITROSTAT) 0.4 MG SL tablet Place 1 tablet (0.4 mg total) under the tongue every 5 (five) minutes as needed.  . OXYGEN Inhale into the lungs as needed. 4 Liters supplemental o2 , as needed "when im up moving around"  . pantoprazole (PROTONIX) 40 MG tablet Take 40 mg by mouth daily.   . Potassium 99 MG TABS Take 1 tablet by mouth daily.   . Tetrahydrozoline HCl (VISINE OP) Place 1 drop into both eyes daily as needed (Dry Eyes).  . ticagrelor (BRILINTA) 90 MG TABS tablet Take 1 tablet (90 mg total) by mouth 2 (two) times daily.  . TRELEGY ELLIPTA 100-62.5-25 MCG/INH AEPB INHALE 1 PUFF INTO THE LUNGS EVERY DAY (Patient taking differently: Inhale 1 puff into the lungs daily. )  . vitamin E 400 UNIT capsule Take 1 tablet by mouth daily.   . [DISCONTINUED] oxycodone-acetaminophen (PERCOCET) 2.5-325 MG tablet Take 1 tablet by mouth every 6 (six) hours as needed for pain.  . Glecaprevir-Pibrentasvir (MAVYRET) 100-40 MG TABS Take 3 tablets by mouth daily.   . [DISCONTINUED] hydrocodone-ibuprofen (VICOPROFEN) 5-200 MG tablet Take 1 tablet by mouth every 8 (eight) hours as needed for pain. (Patient not taking: Reported on 06/22/2018)   No facility-administered encounter medications on file as of 06/22/2018.      Objective: Blood pressure 123/74, pulse 92, temperature 98.4 F (36.9 C), temperature  source Oral, resp. rate 18, height 5' 9"  (1.753 m), weight 139 lb 4.8 oz (63.2 kg). Patient is alert and in no acute distress. He Is using nasal O2. Conjunctiva is pink. Sclera is nonicteric Oropharyngeal mucosa is normal. He has complete upper and partial lower dental plate. No neck masses or thyromegaly noted. Cardiac exam with regular rhythm normal S1 and S2. No murmur or gallop noted. Auscultation lungs reveal vesicular breath sounds bilaterally intensity is diminished. Abdomen is symmetrical.  Small scab noted at needle  entry site for ablative therapy below the right costal margin.  He has fullness in this area but no masses or organomegaly. No LE edema or clubbing noted.  Labs/studies Results:  CBC Latest Ref Rng & Units 06/11/2018 06/10/2018 05/27/2018  WBC 4.0 - 10.5 K/uL 12.0(H) 14.8(H) 17.8(H)  Hemoglobin 13.0 - 17.0 g/dL 11.3(L) 13.3 12.3(L)  Hematocrit 39.0 - 52.0 % 35.3(L) 41.3 37.6(L)  Platelets 150 - 400 K/uL 246 346 266    CMP Latest Ref Rng & Units 06/11/2018 06/09/2018 05/27/2018  Glucose 70 - 99 mg/dL 95 148(H) 96  BUN 8 - 23 mg/dL 23 22 21   Creatinine 0.61 - 1.24 mg/dL 0.81 0.82 0.59(L)  Sodium 135 - 145 mmol/L 140 138 136  Potassium 3.5 - 5.1 mmol/L 3.9 3.6 3.7  Chloride 98 - 111 mmol/L 103 99 99  CO2 22 - 32 mmol/L 32 32 30  Calcium 8.9 - 10.3 mg/dL 8.1(L) 8.5(L) 7.9(L)  Total Protein 6.5 - 8.1 g/dL 5.9(L) 7.3 6.5  Total Bilirubin 0.3 - 1.2 mg/dL 0.8 0.8 1.1  Alkaline Phos 38 - 126 U/L 71 71 82  AST 15 - 41 U/L 144(H) 28 27  ALT 0 - 44 U/L 121(H) 34 34    Hepatic Function Latest Ref Rng & Units 06/11/2018 06/09/2018 05/27/2018  Total Protein 6.5 - 8.1 g/dL 5.9(L) 7.3 6.5  Albumin 3.5 - 5.0 g/dL 3.1(L) 4.1 3.4(L)  AST 15 - 41 U/L 144(H) 28 27  ALT 0 - 44 U/L 121(H) 34 34  Alk Phosphatase 38 - 126 U/L 71 71 82  Total Bilirubin 0.3 - 1.2 mg/dL 0.8 0.8 1.1  Bilirubin, Direct 0.0 - 0.2 mg/dL - - -    No results found for: CRP    Assessment:  #1.  Chronic hepatitis C with cirrhosis.  He has finished antiviral therapy and is due for testing to document response at [facility]. Recent bump in transaminases appear to be due to intervention.  #2.  [place].  He is status post thermal ablation of lesion in left lobe by Dr. Kathlene Cote on 06/10/2018.  He has 3 other small lesions which will be closely monitored.  He is to follow-up with Dr. Kathlene Cote as well.  #3.  Anemia.  Recent drop in H&H appears to be due to post intervention.  He is on Brilinta but not experience any overt GI  bleed.   Plan:  Medication list updated.  Pain medication deleted as he is not taking anymore. Patient will go to the lab for CBC, LFTs, AFP and HCV RNA by PCR quantitative. Presuming HCV RNA by PCR is negative and will be repeated in 3 months to document SVR. Timing of follow-up imaging study per Dr. Kathlene Cote. Office visit in 6 months.

## 2018-06-24 ENCOUNTER — Other Ambulatory Visit: Payer: Self-pay | Admitting: Radiation Oncology

## 2018-06-24 DIAGNOSIS — E785 Hyperlipidemia, unspecified: Secondary | ICD-10-CM | POA: Diagnosis not present

## 2018-06-24 DIAGNOSIS — C3432 Malignant neoplasm of lower lobe, left bronchus or lung: Secondary | ICD-10-CM | POA: Diagnosis not present

## 2018-06-24 DIAGNOSIS — J441 Chronic obstructive pulmonary disease with (acute) exacerbation: Secondary | ICD-10-CM | POA: Diagnosis not present

## 2018-06-24 DIAGNOSIS — B1920 Unspecified viral hepatitis C without hepatic coma: Secondary | ICD-10-CM | POA: Diagnosis not present

## 2018-06-24 DIAGNOSIS — J09X2 Influenza due to identified novel influenza A virus with other respiratory manifestations: Secondary | ICD-10-CM | POA: Diagnosis not present

## 2018-06-24 DIAGNOSIS — I2510 Atherosclerotic heart disease of native coronary artery without angina pectoris: Secondary | ICD-10-CM | POA: Diagnosis not present

## 2018-06-24 DIAGNOSIS — I10 Essential (primary) hypertension: Secondary | ICD-10-CM | POA: Diagnosis not present

## 2018-06-24 DIAGNOSIS — J9601 Acute respiratory failure with hypoxia: Secondary | ICD-10-CM | POA: Diagnosis not present

## 2018-06-24 LAB — HEPATIC FUNCTION PANEL
AG Ratio: 1.1 (calc) (ref 1.0–2.5)
ALT: 39 U/L (ref 9–46)
AST: 34 U/L (ref 10–35)
Albumin: 3.8 g/dL (ref 3.6–5.1)
Alkaline phosphatase (APISO): 116 U/L (ref 35–144)
Bilirubin, Direct: 0.1 mg/dL (ref 0.0–0.2)
Globulin: 3.4 g/dL (calc) (ref 1.9–3.7)
Indirect Bilirubin: 0.6 mg/dL (calc) (ref 0.2–1.2)
Total Bilirubin: 0.7 mg/dL (ref 0.2–1.2)
Total Protein: 7.2 g/dL (ref 6.1–8.1)

## 2018-06-24 LAB — AFP TUMOR MARKER: AFP TUMOR MARKER: 161.4 ng/mL — AB (ref <6.1)

## 2018-06-24 LAB — HEPATITIS C RNA QUANTITATIVE
HCV Quantitative Log: <1.18 Log IU/mL
HCV RNA, PCR, QN: <15 IU/mL

## 2018-06-24 LAB — CBC
HEMATOCRIT: 38.8 % (ref 38.5–50.0)
Hemoglobin: 13.6 g/dL (ref 13.2–17.1)
MCH: 36.3 pg — ABNORMAL HIGH (ref 27.0–33.0)
MCHC: 35.1 g/dL (ref 32.0–36.0)
MCV: 103.5 fL — ABNORMAL HIGH (ref 80.0–100.0)
MPV: 10.4 fL (ref 7.5–12.5)
Platelets: 357 10*3/uL (ref 140–400)
RBC: 3.75 10*6/uL — ABNORMAL LOW (ref 4.20–5.80)
RDW: 11 % (ref 11.0–15.0)
WBC: 10.7 10*3/uL (ref 3.8–10.8)

## 2018-06-24 NOTE — Progress Notes (Signed)
I called the patient today about their upcoming follow-up appointment in radiation oncology.   Given concerns [place] I offered a phone assessment with the patient to determine if coming to the clinic was necessary. The patient accepted.  I let the patient know that I had spoken with Dr. Isidore Moos, and she wanted them to know the importance of washing their hands for at least 20 seconds at a time, especially after going out in public, and before they eat. Do not touch your face, unless your hands are clean, such as when bathing. Get plenty of rest, eat well, and stay hydrated.   I informed him that his recent CT Chest was reassuring with no signs of progression.   Symptomatically, the patient is doing relatively well. He reports currently using oxygen due to a recent hospitalization for the flu. He does tells me that he is slowly recovering and feeling better.   All questions were answered to the patient's satisfaction.  I encouraged the patient to call with any further questions. Otherwise, the plan is follow up in 4 months after a repeat CT scan.    Patient is pleased with this plan, and we will cancel their upcoming follow-up to reduce the risk of COVID-19 transmission.

## 2018-06-25 ENCOUNTER — Other Ambulatory Visit (INDEPENDENT_AMBULATORY_CARE_PROVIDER_SITE_OTHER): Payer: Self-pay | Admitting: *Deleted

## 2018-06-25 DIAGNOSIS — C220 Liver cell carcinoma: Secondary | ICD-10-CM

## 2018-06-26 ENCOUNTER — Ambulatory Visit
Admission: RE | Admit: 2018-06-26 | Discharge: 2018-06-26 | Disposition: A | Discharge status: Home / Self Care | Payer: BLUE CROSS/BLUE SHIELD | Source: Ambulatory Visit | Attending: Radiation Oncology | Admitting: Radiation Oncology

## 2018-06-26 ENCOUNTER — Other Ambulatory Visit (INDEPENDENT_AMBULATORY_CARE_PROVIDER_SITE_OTHER): Payer: Self-pay | Admitting: *Deleted

## 2018-06-26 DIAGNOSIS — C220 Liver cell carcinoma: Secondary | ICD-10-CM

## 2018-06-26 DIAGNOSIS — B1920 Unspecified viral hepatitis C without hepatic coma: Secondary | ICD-10-CM

## 2018-07-10 ENCOUNTER — Encounter (INDEPENDENT_AMBULATORY_CARE_PROVIDER_SITE_OTHER): Payer: Self-pay | Admitting: *Deleted

## 2018-07-13 DIAGNOSIS — J9611 Chronic respiratory failure with hypoxia: Secondary | ICD-10-CM | POA: Diagnosis not present

## 2018-07-13 DIAGNOSIS — J449 Chronic obstructive pulmonary disease, unspecified: Secondary | ICD-10-CM | POA: Diagnosis not present

## 2018-07-13 DIAGNOSIS — C3491 Malignant neoplasm of unspecified part of right bronchus or lung: Secondary | ICD-10-CM | POA: Diagnosis not present

## 2018-07-13 DIAGNOSIS — I2510 Atherosclerotic heart disease of native coronary artery without angina pectoris: Secondary | ICD-10-CM | POA: Diagnosis not present

## 2018-07-18 DIAGNOSIS — J441 Chronic obstructive pulmonary disease with (acute) exacerbation: Secondary | ICD-10-CM | POA: Diagnosis not present

## 2018-07-19 DIAGNOSIS — J441 Chronic obstructive pulmonary disease with (acute) exacerbation: Secondary | ICD-10-CM | POA: Diagnosis not present

## 2018-08-17 DIAGNOSIS — J441 Chronic obstructive pulmonary disease with (acute) exacerbation: Secondary | ICD-10-CM | POA: Diagnosis not present

## 2018-08-18 DIAGNOSIS — J441 Chronic obstructive pulmonary disease with (acute) exacerbation: Secondary | ICD-10-CM | POA: Diagnosis not present

## 2018-09-02 ENCOUNTER — Other Ambulatory Visit (INDEPENDENT_AMBULATORY_CARE_PROVIDER_SITE_OTHER): Payer: Self-pay | Admitting: *Deleted

## 2018-09-02 ENCOUNTER — Encounter (INDEPENDENT_AMBULATORY_CARE_PROVIDER_SITE_OTHER): Payer: Self-pay | Admitting: *Deleted

## 2018-09-02 DIAGNOSIS — B1920 Unspecified viral hepatitis C without hepatic coma: Secondary | ICD-10-CM

## 2018-09-02 DIAGNOSIS — C220 Liver cell carcinoma: Secondary | ICD-10-CM

## 2018-09-09 DIAGNOSIS — C3491 Malignant neoplasm of unspecified part of right bronchus or lung: Secondary | ICD-10-CM | POA: Diagnosis not present

## 2018-09-09 DIAGNOSIS — J449 Chronic obstructive pulmonary disease, unspecified: Secondary | ICD-10-CM | POA: Diagnosis not present

## 2018-09-09 DIAGNOSIS — I2510 Atherosclerotic heart disease of native coronary artery without angina pectoris: Secondary | ICD-10-CM | POA: Diagnosis not present

## 2018-09-09 DIAGNOSIS — C787 Secondary malignant neoplasm of liver and intrahepatic bile duct: Secondary | ICD-10-CM | POA: Diagnosis not present

## 2018-09-14 ENCOUNTER — Other Ambulatory Visit: Payer: Self-pay | Admitting: Interventional Radiology

## 2018-09-14 DIAGNOSIS — C220 Liver cell carcinoma: Secondary | ICD-10-CM

## 2018-09-17 DIAGNOSIS — J441 Chronic obstructive pulmonary disease with (acute) exacerbation: Secondary | ICD-10-CM | POA: Diagnosis not present

## 2018-09-17 DIAGNOSIS — J449 Chronic obstructive pulmonary disease, unspecified: Secondary | ICD-10-CM | POA: Diagnosis not present

## 2018-09-18 DIAGNOSIS — J441 Chronic obstructive pulmonary disease with (acute) exacerbation: Secondary | ICD-10-CM | POA: Diagnosis not present

## 2018-09-21 ENCOUNTER — Other Ambulatory Visit: Payer: Self-pay | Admitting: Cardiology

## 2018-09-23 ENCOUNTER — Ambulatory Visit (HOSPITAL_COMMUNITY)
Admission: RE | Admit: 2018-09-23 | Discharge: 2018-09-23 | Disposition: A | Discharge status: Home / Self Care | Payer: BC Managed Care – PPO | Source: Ambulatory Visit | Attending: Interventional Radiology | Admitting: Interventional Radiology

## 2018-09-23 ENCOUNTER — Other Ambulatory Visit: Payer: Self-pay

## 2018-09-23 ENCOUNTER — Encounter (HOSPITAL_COMMUNITY): Payer: Self-pay

## 2018-09-23 ENCOUNTER — Telehealth: Payer: Self-pay | Admitting: *Deleted

## 2018-09-23 DIAGNOSIS — C220 Liver cell carcinoma: Secondary | ICD-10-CM

## 2018-09-23 NOTE — Telephone Encounter (Signed)
CALLED PATIENT TO INFORM THAT DR. SQUIRE WANTS HIS CT AND FU IN July 2020, LVM FOR A RETURN CALL

## 2018-09-25 ENCOUNTER — Ambulatory Visit: Payer: BLUE CROSS/BLUE SHIELD | Admitting: Radiation Oncology

## 2018-10-02 ENCOUNTER — Ambulatory Visit (HOSPITAL_COMMUNITY)
Admission: RE | Admit: 2018-10-02 | Discharge: 2018-10-02 | Disposition: A | Discharge status: Home / Self Care | Payer: BC Managed Care – PPO | Source: Ambulatory Visit | Attending: Interventional Radiology | Admitting: Interventional Radiology

## 2018-10-02 ENCOUNTER — Other Ambulatory Visit: Payer: Self-pay

## 2018-10-02 DIAGNOSIS — C220 Liver cell carcinoma: Secondary | ICD-10-CM | POA: Insufficient documentation

## 2018-10-02 DIAGNOSIS — K7689 Other specified diseases of liver: Secondary | ICD-10-CM | POA: Diagnosis not present

## 2018-10-02 LAB — POCT I-STAT CREATININE: Creatinine, Ser: 0.7 mg/dL (ref 0.61–1.24)

## 2018-10-02 MED ORDER — GADOBUTROL 1 MMOL/ML IV SOLN
6.0000 mL | Freq: Once | INTRAVENOUS | Status: AC | PRN
  Administered 2018-10-02: 6 mL via INTRAVENOUS

## 2018-10-06 ENCOUNTER — Other Ambulatory Visit: Payer: Self-pay

## 2018-10-06 ENCOUNTER — Ambulatory Visit
Admission: RE | Admit: 2018-10-06 | Discharge: 2018-10-06 | Disposition: A | Discharge status: Home / Self Care | Payer: BC Managed Care – PPO | Source: Ambulatory Visit | Attending: Student | Admitting: Student

## 2018-10-06 ENCOUNTER — Encounter: Payer: Self-pay | Admitting: *Deleted

## 2018-10-06 DIAGNOSIS — C220 Liver cell carcinoma: Secondary | ICD-10-CM | POA: Diagnosis not present

## 2018-10-06 DIAGNOSIS — Z9981 Dependence on supplemental oxygen: Secondary | ICD-10-CM | POA: Diagnosis not present

## 2018-10-06 DIAGNOSIS — Z9889 Other specified postprocedural states: Secondary | ICD-10-CM | POA: Diagnosis not present

## 2018-10-06 HISTORY — PX: IR RADIOLOGIST EVAL & MGMT: IMG5224

## 2018-10-06 NOTE — Progress Notes (Signed)
Chief Complaint: Patient was consulted remotely today (TeleHealth) for follow up after liver ablation.  History of Present Illness: Adam Bass is a 68 y.o. male with a history of cirrhosis, hepatitis C and a 2.6 cm mass consistent with hepatocellular carcinoma in the left lobe of the liver treated with percutaneous microwave thermal ablation on 06/10/2018.  After the procedure Adam Bass had some mild abdominal discomfort for a few days.  He does state that it has taken several months to recover from a respiratory standpoint after general anesthesia.  He remains on home oxygen.   Past Medical History:  Diagnosis Date   Anticoagulant long-term use    brillinta   Bilateral lower extremity edema    feet, noticed since discharge from hospital admission 05-18-2018, "i was laid up in the bed for five days"    CAD (coronary artery disease) cardiologist-- dr j. branch   hx STEMI 11-23-2017,  s/p  cardiac cath with Thrombectomy, PCI and DES x1 to midRCA, and other nonobstrucitve CAD involving pLAD, OM1, dRCA, posterior atrio, LVEF 45-50%,  LVEDP 46mHg   COPD (chronic obstructive pulmonary disease) with emphysema ([facility]    pulmonology-- dr wert-- GOLD III mix (pt still smokes),  last exacerbation -due to the flu , [place] [facility]yspnea on exertion)    Ectatic abdominal aorta ([facility]    MRI 11/ 2019   Hepatitis C GI-- dr rLaural Golden  dx 02-02-2018--- currently treated on antiviral medication   Hepatocellular carcinoma ([facility]    History of radiation therapy 06/03/17- 06/09/17   Left Lung treated to 54 Gy with 3 fx of 18 Gy. SBRT   History of skin cancer    04/ 2017 excision from face   History of ST elevation myocardial infarction (STEMI) 11/23/2017   inferior wall-- s/p cardiac cath w/ thrombectomy, PCI, and DES   HLD (hyperlipidemia)    Hypertension    Primary squamous cell carcinoma of left lung ([facility] oncologist-- dr sIsidore Moos  dx 02/ 2018-- Stage  IA2 Left lower lung,  s/p  Stereotactic radiation completed 06-09-2017   Productive cough    per pt mostly clear   Requires supplemental oxygen    06-09-2018 currently pt is using a portable O2 as needed and at night   S/P drug eluting coronary stent placement 11/23/2017   [facility]  Urinary incontinence    wear depends   Wears dentures    fuller upper,  lower partial   Wears glasses     Past Surgical History:  Procedure Laterality Date   COLONOSCOPY N/A 08/27/2012   Procedure: COLONOSCOPY;  Surgeon: NRogene Houston MD;  [facility];  Service: Endoscopy;  Laterality: N/A;  1200   CORONARY STENT INTERVENTION N/A 11/23/2017   Procedure: CORONARY STENT INTERVENTION;  Surgeon: VJettie Booze MD;  [facility];  Service: Cardiovascular;  Laterality: N/A;   IR RADIOLOGIST EVAL & MGMT  03/04/2018   IR RADIOLOGIST EVAL & MGMT  10/06/2018   LEFT HEART CATH AND CORONARY ANGIOGRAPHY N/A 11/23/2017   Procedure: LEFT HEART CATH AND CORONARY ANGIOGRAPHY;  Surgeon: VJettie Booze MD;  [facility];  Service: Cardiovascular;  Laterality: N/A;   RADIOFREQUENCY ABLATION N/A 06/10/2018   Procedure: MICROWAVE THERMAL ABLATION LIVER;  Surgeon: YAletta Edouard MD;  Location: [facility];  Service: Anesthesiology;  Laterality: N/A;    Allergies: Bee venom  Medications: Prior to Admission  medications   Medication Sig Start Date End Date Taking? Authorizing Provider  albuterol (PROAIR HFA) 108 (90 Base) MCG/ACT inhaler Inhale 2 puffs into the lungs every 6 (six) hours as needed for wheezing.     [provider]  albuterol (PROVENTIL) 4 MG tablet Take 4 mg by mouth 3 (three) times daily.    [provider]  aspirin EC 81 MG tablet Take 81 mg by mouth daily. Take with brilanta    [provider]  bisoprolol (ZEBETA) 5 MG tablet Take 0.5 tablets (2.5 mg total) by mouth daily. 11/28/17   Adam Bellis B, NP    Blood Pressure KIT 1 Units by Does not apply route daily. 11/27/17   Cheryln Manly, NP  ezetimibe (ZETIA) 10 MG tablet Take 1 tablet (10 mg total) by mouth daily. 11/27/17 06/22/18  Cheryln Manly, NP  famotidine (PEPCID) 20 MG tablet Take 20 mg by mouth daily as needed for heartburn or indigestion.    [provider]  fluticasone (FLONASE) 50 MCG/ACT nasal spray Place 1 spray into both nostrils daily as needed for allergies.     [provider]  Glecaprevir-Pibrentasvir (MAVYRET) 100-40 MG TABS Take 3 tablets by mouth daily.     [provider]  lisinopril (PRINIVIL,ZESTRIL) 2.5 MG tablet Take 1 tablet (2.5 mg total) by mouth daily. 04/20/18 07/19/18  Arnoldo Lenis, MD  Multiple Vitamins-Minerals (MULTIVITAMIN WITH MINERALS) tablet Take 1 tablet by mouth daily.    [provider]  nitroGLYCERIN (NITROSTAT) 0.4 MG SL tablet PLACE 1 TABLET UNDER THE TONGUE EVERY 5 MINUTES AS NEEDED(FOLLOW MD DIRECTIONS FOR WHEN TO CALL 911) 09/21/18   Adam Serge, NP  OXYGEN Inhale into the lungs as needed. 4 Liters supplemental o2 , as needed "when im up moving around"    [provider]  pantoprazole (PROTONIX) 40 MG tablet Take 40 mg by mouth daily.  02/02/18   [provider]  Potassium 99 MG TABS Take 1 tablet by mouth daily.     [provider]  Tetrahydrozoline HCl (VISINE OP) Place 1 drop into both eyes daily as needed (Dry Eyes).    [provider]  ticagrelor (BRILINTA) 90 MG TABS tablet Take 1 tablet (90 mg total) by mouth 2 (two) times daily. 11/27/17   Cheryln Manly, NP  TRELEGY ELLIPTA 100-62.5-25 MCG/INH AEPB INHALE 1 PUFF INTO THE LUNGS EVERY DAY Patient taking differently: Inhale 1 puff into the lungs daily.  12/24/17   Tanda Rockers, MD  vitamin E 400 UNIT capsule Take 1 tablet by mouth daily.     [provider]     Family History  Problem Relation Age of Onset   Breast cancer Sister    Colon  cancer Neg Hx     Social History   Socioeconomic History   Marital status: Divorced    Spouse name: Not on file   Number of children: Not on file   Years of education: Not on file   Highest education level: Not on file  Occupational History   Not on file  Social Needs   Financial resource strain: Not on file   Food insecurity    Worry: Not on file    Inability: Not on file   Transportation needs    Medical: No    Non-medical: No  Tobacco Use   Smoking status: Current Every Day Smoker    Packs/day: 0.75    Years: 50.00    Pack  years: 37.50    Types: Cigars   Smokeless tobacco: Never Used   Tobacco comment: . He is smoking about 5-10 cigarettes daily.   Substance and Sexual Activity   Alcohol use: Yes    Alcohol/week: 8.0 standard drinks    Types: 8 Cans of beer per week    Comment: 8 beers weekly, give or take    Drug use: No   Sexual activity: Not on file  Lifestyle   Physical activity    Days per week: Not on file    Minutes per session: Not on file   Stress: Not on file  Relationships   Social connections    Talks on phone: Not on file    Gets together: Not on file    Attends religious service: Not on file    Active member of club or organization: Not on file    Attends meetings of clubs or organizations: Not on file    Relationship status: Not on file  Other Topics Concern   Not on file  Social History Narrative   Not on file    ECOG Status: 1 - Symptomatic but completely ambulatory  Review of Systems  Constitutional: Positive for activity change and fatigue.  Respiratory: Positive for shortness of breath.   Cardiovascular: Negative.   Gastrointestinal: Negative.   Genitourinary: Negative.   Musculoskeletal: Negative.   Neurological: Negative.     Review of Systems: A 12 point ROS discussed and pertinent positives are indicated in the HPI above.  All other systems are negative.  Physical Exam No direct physical exam was  performed (except for noted visual exam findings with Video Visits).   Vital Signs: There were no vitals taken for this visit.  Imaging: Mr Abdomen Wwo Contrast  Result Date: 10/02/2018 CLINICAL DATA:  Lung cancer. Follow-up liver mass identified on chest CT. Patient status percutaneous thermal ablation of liver lesion. Hepatitis C. Hepatocellular carcinoma. Focal EXAM: MRI ABDOMEN WITHOUT AND WITH CONTRAST TECHNIQUE: Multiplanar multisequence MR imaging of the abdomen was performed both before and after the administration of intravenous contrast. CONTRAST:  6 mL Gadavist COMPARISON:  None. FINDINGS: Lower chest:  Lung bases are clear. Hepatobiliary: Ablation defect within the central LEFT hepatic lobe (segment 4A) measures 3.7 by 2.8 cm and has inherent T1 shortening consistent with internal blood product. The ablation defect demonstrates no post-contrast enhancement as demonstrated on subtraction imaging (series 5008 and 5009). The ablation defect is immediately adjacent to the LEFT portal vein. Within the RIGHT hepatic lobe (segment 8) small peripheral enhancing lesion measures 15 mm (image 19/5005). This rim enhancing pattern persists on the more delayed sequences (5007 and 50010) Additionally several small 1 cm enhancing lesions in LEFT and RIGHT hepatic lobe which do not persist on the delayed sequences Pancreas: Normal pancreatic parenchymal intensity. No ductal dilatation or inflammation. Spleen: Normal spleen. Adrenals/urinary tract: Adrenal glands normal. Nonenhancing hepatic cysts. Stomach/Bowel: Stomach and limited of the small bowel is unremarkable Vascular/Lymphatic: Abdominal aortic normal caliber. No retroperitoneal periportal lymphadenopathy. Musculoskeletal: No aggressive osseous lesion IMPRESSION: 1. No evidence hepatocellular carcinoma recurrence in the LEFT hepatic lobe at thermal ablation site. 2. Marginal enhancing lesion in the superior RIGHT hepatic lobe (segment 8) without washout  is slightly enlarged from comparison exam. LiRAD- 3. Recommend close attention on follow- 3. Additional smaller enhancing lesions LEFT and RIGHT hepatic show arterial enhancement but no delayed visibility. Potential dysplastic nodules. Electronically Signed   By: Suzy Bouchard M.D.   On: 10/02/2018 14:28  Ir Radiologist Eval & Mgmt  Result Date: 10/06/2018 Please refer to notes tab for details about interventional procedure. (Op Note)   Labs:  CBC: Recent Labs    05/27/18 0736 06/10/18 0740 06/11/18 0317 06/22/18 1222  WBC 17.8* 14.8* 12.0* 10.7  HGB 12.3* 13.3 11.3* 13.6  HCT 37.6* 41.3 35.3* 38.8  PLT 266 346 246 357    COAGS: Recent Labs    11/23/17 1532 02/20/18 1051 05/27/18 0736 06/10/18 0740  INR 1.17 1.1 0.98 0.9  APTT 140*  --   --  26    BMP: Recent Labs    05/22/18 1413 05/27/18 0736 06/09/18 1455 06/11/18 0317 10/02/18 1117  NA 134* 136 138 140  --   K 4.7 3.7 3.6 3.9  --   CL 98 99 99 103  --   CO2 29 30 32 32  --   GLUCOSE 116* 96 148* 95  --   BUN 18 21 22 23   --   CALCIUM 8.9 7.9* 8.5* 8.1*  --   CREATININE 0.64 0.59* 0.82 0.81 0.70  GFRNONAA >60 >60 >60 >60  --   GFRAA >60 >60 >60 >60  --     LIVER FUNCTION TESTS: Recent Labs    05/22/18 1413 05/27/18 0736 06/09/18 1455 06/11/18 0317 06/22/18 1222  BILITOT 1.4* 1.1 0.8 0.8 0.7  AST 32 27 28 144* 34  ALT 46* 34 34 121* 39  ALKPHOS 60 82 71 71  --   PROT 6.5 6.5 7.3 5.9* 7.2  ALBUMIN 3.6 3.4* 4.1 3.1*  --     TUMOR MARKERS: Recent Labs    02/02/18 1426 06/22/18 1222  AFPTM 163.9* 161.4*    Assessment and Plan:  I met with Mr. Fleischer by video conference.  I reviewed the recent 10/02/2018 follow-up MRI imaging findings with him by screen sharing.  This demonstrates a nice large ablation defect in segment IV of the left lobe encompassing the previously enhancing left lobe carcinoma.  There does not appear to be any residual enhancement at the level of ablation.  Subtle  lesion in the superior aspect of segment VIII in the right lobe appears slightly larger and demonstrates ring enhancement, measuring roughly 1.3-1.4 cm in greatest diameter by my measurement.  This previously measured approximately 1.0 cm by MRI last year.  There are some additional small areas of potential enhancement in the left and right lobes on the arterial phase that do not persist.  Immediately following ablation, AFP level did not decrease significantly on 3/9 and was 161.  A follow-up AFP level has been scheduled by Dr. Laural Golden.  I recommended that we follow the liver with another MRI in roughly 3 months, especially if the AFP level does not decrease further.  The lesion in segment VIII showing slight growth could certainly represent a second hepatocellular carcinoma.  Electronically Signed: Azzie Roup 10/06/2018, 12:19 PM     I spent a total of 15 Minutes in remote  clinical consultation, greater than 50% of which was counseling/coordinating care post ablation of a hepatocellular carcinoma.    Visit type: Audio and video (WebEx).   Alternative for in-person consultation at [facility], [address] [place], [place], [place]. This visit type was conducted due to national recommendations for restrictions regarding the COVID-19 Pandemic (e.g. social distancing).  This format is felt to be most appropriate for this patient at this time.  All issues noted in this document were discussed and addressed.

## 2018-10-17 DIAGNOSIS — J449 Chronic obstructive pulmonary disease, unspecified: Secondary | ICD-10-CM | POA: Diagnosis not present

## 2018-10-17 DIAGNOSIS — J441 Chronic obstructive pulmonary disease with (acute) exacerbation: Secondary | ICD-10-CM | POA: Diagnosis not present

## 2018-10-18 DIAGNOSIS — J441 Chronic obstructive pulmonary disease with (acute) exacerbation: Secondary | ICD-10-CM | POA: Diagnosis not present

## 2018-10-19 ENCOUNTER — Telehealth: Payer: Self-pay | Admitting: *Deleted

## 2018-10-19 NOTE — Telephone Encounter (Signed)
Called patient to inform of CT for 10-26-18 - arrival time- 4:15 pm @ [facility], no restrictions to test, patient to receive results on 10-27-18 with Dr. Isidore Moos, spoke with patient and he is aware of these appts.

## 2018-10-21 DIAGNOSIS — Z23 Encounter for immunization: Secondary | ICD-10-CM | POA: Diagnosis not present

## 2018-10-21 DIAGNOSIS — J449 Chronic obstructive pulmonary disease, unspecified: Secondary | ICD-10-CM | POA: Diagnosis not present

## 2018-10-21 DIAGNOSIS — I2510 Atherosclerotic heart disease of native coronary artery without angina pectoris: Secondary | ICD-10-CM | POA: Diagnosis not present

## 2018-10-21 DIAGNOSIS — C3491 Malignant neoplasm of unspecified part of right bronchus or lung: Secondary | ICD-10-CM | POA: Diagnosis not present

## 2018-10-21 DIAGNOSIS — C787 Secondary malignant neoplasm of liver and intrahepatic bile duct: Secondary | ICD-10-CM | POA: Diagnosis not present

## 2018-10-26 ENCOUNTER — Ambulatory Visit (HOSPITAL_COMMUNITY)
Admission: RE | Admit: 2018-10-26 | Discharge: 2018-10-26 | Disposition: A | Discharge status: Home / Self Care | Payer: BC Managed Care – PPO | Source: Ambulatory Visit | Attending: Radiation Oncology | Admitting: Radiation Oncology

## 2018-10-26 ENCOUNTER — Other Ambulatory Visit: Payer: Self-pay

## 2018-10-26 DIAGNOSIS — C3432 Malignant neoplasm of lower lobe, left bronchus or lung: Secondary | ICD-10-CM | POA: Diagnosis not present

## 2018-10-27 ENCOUNTER — Ambulatory Visit
Admission: RE | Admit: 2018-10-27 | Discharge: 2018-10-27 | Disposition: A | Discharge status: Home / Self Care | Payer: Medicare Other | Source: Ambulatory Visit | Attending: Radiation Oncology | Admitting: Radiation Oncology

## 2018-10-27 ENCOUNTER — Ambulatory Visit: Payer: Self-pay | Admitting: Radiation Oncology

## 2018-10-27 DIAGNOSIS — R918 Other nonspecific abnormal finding of lung field: Secondary | ICD-10-CM | POA: Diagnosis not present

## 2018-10-27 DIAGNOSIS — Z923 Personal history of irradiation: Secondary | ICD-10-CM | POA: Diagnosis not present

## 2018-10-27 DIAGNOSIS — C3432 Malignant neoplasm of lower lobe, left bronchus or lung: Secondary | ICD-10-CM | POA: Diagnosis not present

## 2018-10-27 NOTE — Progress Notes (Signed)
Radiation Oncology         [phone]) [phone] ________________________________  Name: Adam Bass MRN: [id number]  Date: 10/27/2018  DOB: 1950/05/30  Follow-Up Visit Note by WebEx due to pandemic precautions  Outpatient  CC: Sinda Du, MD  Hildred Laser, MD  Diagnosis and Prior Radiotherapy:    ICD-10-CM   1. Primary cancer of left lower lobe of lung (HCC)  C34.32     Primary cancer of left lower lobe of lung ([place]) Staging form: Lung, AJCC 8th Edition - Clinical stage from 04/30/2017: Stage IA2 (cT1b, cN0, cM0) - Signed by Eppie Gibson, MD on 04/30/2017    SBRT dates:   06/03/17 - 06/09/17 Site/dose:   Left lung treated to 54 Gy in 3 fractions of 18 Gy, given every other day  CHIEF COMPLAINT: Here for follow-up and surveillance of left lower lung cancer  Narrative:  The patient returns today for routine follow-up since completion of his radiation therapy on 06/09/2017. He underwent a CT Chest without contrast yesterday, 10/26/2018 with results showing: Similar size of left upper lobe and left lower lobe pulmonary lesions. The 4 mm nodule within the superior segment is also unchanged. New, nonspecific nodule in the lateral left upper lobe measures 4 mm; advise attention on follow-up imaging.  I personally reviewed his imaging.  Since his last visit, he underwent microwave thermal ablation of the liver, performed by Dr. Kathlene Cote on 06/10/2018.  He has done well since then.  He also recently had MRI of the abdomen done on 10/02/2018 which did not show any evidence of hepatocellular carcinoma recurrence in the left hepatic lobe at thermal ablation site. Marginal enhancing lesion in the superior right hepatic lobe without washout is slightly enlarged from comparison exam (LIRAD-3). Additional smaller enhancing lesions left and right hepatic show arterial enhancement but no delayed visibility. Potential dysplastic nodules.  He reports that his breathing is a little worse but responds well to  his inhalers, he continues to follow with Dr. Luan Pulling for pulmonology issues.            ALLERGIES:  is allergic to bee venom.  Meds: Current Outpatient Medications  Medication Sig Dispense Refill   albuterol (PROAIR HFA) 108 (90 Base) MCG/ACT inhaler Inhale 2 puffs into the lungs every 6 (six) hours as needed for wheezing.      albuterol (PROVENTIL) 4 MG tablet Take 4 mg by mouth 3 (three) times daily.     aspirin EC 81 MG tablet Take 81 mg by mouth daily. Take with brilanta     bisoprolol (ZEBETA) 5 MG tablet Take 0.5 tablets (2.5 mg total) by mouth daily. 60 tablet 1   Blood Pressure KIT 1 Units by Does not apply route daily. 1 each 0   ezetimibe (ZETIA) 10 MG tablet Take 1 tablet (10 mg total) by mouth daily. 90 tablet 1   famotidine (PEPCID) 20 MG tablet Take 20 mg by mouth daily as needed for heartburn or indigestion.     fluticasone (FLONASE) 50 MCG/ACT nasal spray Place 1 spray into both nostrils daily as needed for allergies.      Glecaprevir-Pibrentasvir (MAVYRET) 100-40 MG TABS Take 3 tablets by mouth daily.      lisinopril (PRINIVIL,ZESTRIL) 2.5 MG tablet Take 1 tablet (2.5 mg total) by mouth daily. 90 tablet 3   Multiple Vitamins-Minerals (MULTIVITAMIN WITH MINERALS) tablet Take 1 tablet by mouth daily.     nitroGLYCERIN (NITROSTAT) 0.4 MG SL tablet PLACE 1 TABLET UNDER THE TONGUE EVERY 5  MINUTES AS NEEDED(FOLLOW MD DIRECTIONS FOR WHEN TO CALL 911) 25 tablet 3   OXYGEN Inhale into the lungs as needed. 4 Liters supplemental o2 , as needed "when im up moving around"     pantoprazole (PROTONIX) 40 MG tablet Take 40 mg by mouth daily.   5   Potassium 99 MG TABS Take 1 tablet by mouth daily.      Tetrahydrozoline HCl (VISINE OP) Place 1 drop into both eyes daily as needed (Dry Eyes).     ticagrelor (BRILINTA) 90 MG TABS tablet Take 1 tablet (90 mg total) by mouth 2 (two) times daily. 180 tablet 2   TRELEGY ELLIPTA 100-62.5-25 MCG/INH AEPB INHALE 1 PUFF INTO THE LUNGS  EVERY DAY (Patient taking differently: Inhale 1 puff into the lungs daily. ) 60 each 11   vitamin E 400 UNIT capsule Take 1 tablet by mouth daily.      No current facility-administered medications for this encounter.     Physical Findings: The patient is in no acute distress. Patient is alert and oriented.  vitals were not taken for this visit. .     Lab Findings: Lab Results  Component Value Date   WBC 10.7 06/22/2018   HGB 13.6 06/22/2018   HCT 38.8 06/22/2018   MCV 103.5 (H) 06/22/2018   PLT 357 06/22/2018    Radiographic Findings: Ct Chest Wo Contrast  Result Date: 10/27/2018 CLINICAL DATA:  Primary cancer of left lower lobe. Non-small cell lung cancer. Follow-up. EXAM: CT CHEST WITHOUT CONTRAST TECHNIQUE: Multidetector CT imaging of the chest was performed following the standard protocol without IV contrast. COMPARISON:  06/19/2018 FINDINGS: Cardiovascular: The heart size appears within normal limits. No pericardial effusion. Aortic atherosclerosis. Left main and 3 vessel coronary artery calcifications. Mediastinum/Nodes: Normal appearance of the thyroid gland. The trachea appears patent and is midline. No enlarged mediastinal or hilar lymph nodes. Lungs/Pleura: Severe changes of emphysema. No pleural effusion, airspace consolidation or atelectasis. -Index lesion within the left upper lobe measures 1.5 x 1.2 by 1.3 cm. Previously 1.5 x 1.2 by 1.1 cm. -index nodule within the left lower lobe measures 0.7 by 0.6 by 0.5 cm, image 112/5. Previously 0.7 x 0.7 by 0.6 cm. Small nodule in superior segment of left lower lobe measures 4 mm, image 62/5. Unchanged. -New tiny nodule in the lateral left upper lobe measures 4 mm, image 97/5. Nonspecific. Previous cluster of small nodules in the lateral right upper lobe has resolved compatible with inflammatory or infectious process. Upper Abdomen: Cirrhotic liver is again noted. Ablation defect within left hepatic lobe is again noted, similar to  previous exam. No acute abnormality. Musculoskeletal: No aggressive lytic or sclerotic bone lesions. IMPRESSION: 1. Similar size of left upper lobe and left lower lobe pulmonary lesions. The 4 mm nodule within the superior segment is also unchanged. 2. New, nonspecific nodule in the lateral left upper lobe measures 4 mm. Advise attention on follow-up imaging. 3. Aortic Atherosclerosis (ICD10-I70.0) and Emphysema (ICD10-J43.9). 4. Coronary artery calcifications. 5. Cirrhosis with stable left hepatic lobe ablation defect. Ablation defect is incompletely evaluated on non dedicated exam. Electronically Signed   By: Kerby Moors M.D.   On: 10/27/2018 08:29   Mr Abdomen Wwo Contrast  Result Date: 10/02/2018 CLINICAL DATA:  Lung cancer. Follow-up liver mass identified on chest CT. Patient status percutaneous thermal ablation of liver lesion. Hepatitis C. Hepatocellular carcinoma. Focal EXAM: MRI ABDOMEN WITHOUT AND WITH CONTRAST TECHNIQUE: Multiplanar multisequence MR imaging of the abdomen was performed both before  and after the administration of intravenous contrast. CONTRAST:  6 mL Gadavist COMPARISON:  None. FINDINGS: Lower chest:  Lung bases are clear. Hepatobiliary: Ablation defect within the central LEFT hepatic lobe (segment 4A) measures 3.7 by 2.8 cm and has inherent T1 shortening consistent with internal blood product. The ablation defect demonstrates no post-contrast enhancement as demonstrated on subtraction imaging (series 5008 and 5009). The ablation defect is immediately adjacent to the LEFT portal vein. Within the RIGHT hepatic lobe (segment 8) small peripheral enhancing lesion measures 15 mm (image 19/5005). This rim enhancing pattern persists on the more delayed sequences (5007 and 50010) Additionally several small 1 cm enhancing lesions in LEFT and RIGHT hepatic lobe which do not persist on the delayed sequences Pancreas: Normal pancreatic parenchymal intensity. No ductal dilatation or  inflammation. Spleen: Normal spleen. Adrenals/urinary tract: Adrenal glands normal. Nonenhancing hepatic cysts. Stomach/Bowel: Stomach and limited of the small bowel is unremarkable Vascular/Lymphatic: Abdominal aortic normal caliber. No retroperitoneal periportal lymphadenopathy. Musculoskeletal: No aggressive osseous lesion IMPRESSION: 1. No evidence hepatocellular carcinoma recurrence in the LEFT hepatic lobe at thermal ablation site. 2. Marginal enhancing lesion in the superior RIGHT hepatic lobe (segment 8) without washout is slightly enlarged from comparison exam. LiRAD- 3. Recommend close attention on follow- 3. Additional smaller enhancing lesions LEFT and RIGHT hepatic show arterial enhancement but no delayed visibility. Potential dysplastic nodules. Electronically Signed   By: Suzy Bouchard M.D.   On: 10/02/2018 14:28   Ir Radiologist Eval & Mgmt  Result Date: 10/06/2018 Please refer to notes tab for details about interventional procedure. (Op Note)   Impression/Plan:  This is a very nice gentleman with a history of left lung cancer, Lower lobe; responded well to SBRT. Scan appears stable no evidence of progression or recurrence on recent CT chest.      I will schedule his next CT of his chest to take place in 4 months with follow-up soon after.  He knows to call if he has any issues in the interim.  This encounter was provided by telemedicine platform Webex.  The patient has given verbal consent for this type of encounter and has been advised to only accept a meeting of this type in a secure network environment. The time spent during this encounter was over 10 minutes. The attendants for this meeting include Eppie Gibson  and [name].  During the encounter, Eppie Gibson was located at [facility].  [name] was located at home.   ___________________________________   Eppie Gibson, MD  This document serves as a record of  services personally performed by Eppie Gibson, MD. It was created on her behalf by Rae Lips, a trained medical scribe. The creation of this record is based on the scribe's personal observations and the provider's statements to them. This document has been checked and approved by the attending provider.

## 2018-10-28 ENCOUNTER — Other Ambulatory Visit: Payer: Self-pay | Admitting: Radiation Oncology

## 2018-10-28 ENCOUNTER — Encounter: Payer: Self-pay | Admitting: Radiation Oncology

## 2018-10-28 DIAGNOSIS — C3432 Malignant neoplasm of lower lobe, left bronchus or lung: Secondary | ICD-10-CM

## 2018-10-28 DIAGNOSIS — J449 Chronic obstructive pulmonary disease, unspecified: Secondary | ICD-10-CM | POA: Diagnosis not present

## 2018-10-28 DIAGNOSIS — J9611 Chronic respiratory failure with hypoxia: Secondary | ICD-10-CM | POA: Diagnosis not present

## 2018-11-05 DIAGNOSIS — J449 Chronic obstructive pulmonary disease, unspecified: Secondary | ICD-10-CM | POA: Diagnosis not present

## 2018-11-18 DIAGNOSIS — J441 Chronic obstructive pulmonary disease with (acute) exacerbation: Secondary | ICD-10-CM | POA: Diagnosis not present

## 2018-11-28 DIAGNOSIS — J9611 Chronic respiratory failure with hypoxia: Secondary | ICD-10-CM | POA: Diagnosis not present

## 2018-11-28 DIAGNOSIS — J449 Chronic obstructive pulmonary disease, unspecified: Secondary | ICD-10-CM | POA: Diagnosis not present

## 2018-12-06 DIAGNOSIS — J449 Chronic obstructive pulmonary disease, unspecified: Secondary | ICD-10-CM | POA: Diagnosis not present

## 2018-12-15 ENCOUNTER — Encounter (INDEPENDENT_AMBULATORY_CARE_PROVIDER_SITE_OTHER): Payer: Self-pay | Admitting: Internal Medicine

## 2018-12-15 ENCOUNTER — Ambulatory Visit (INDEPENDENT_AMBULATORY_CARE_PROVIDER_SITE_OTHER): Payer: BC Managed Care – PPO | Admitting: Internal Medicine

## 2018-12-15 ENCOUNTER — Other Ambulatory Visit: Payer: Self-pay

## 2018-12-15 VITALS — BP 133/82 | HR 65 | Temp 97.9°F | Ht 69.0 in | Wt 142.5 lb

## 2018-12-15 DIAGNOSIS — C220 Liver cell carcinoma: Secondary | ICD-10-CM | POA: Diagnosis not present

## 2018-12-15 DIAGNOSIS — K7460 Unspecified cirrhosis of liver: Secondary | ICD-10-CM | POA: Diagnosis not present

## 2018-12-15 DIAGNOSIS — B182 Chronic viral hepatitis C: Secondary | ICD-10-CM | POA: Diagnosis not present

## 2018-12-15 NOTE — Patient Instructions (Signed)
Please check with Dr. Luan Pulling about getting hepatitis A and B vaccination. Physician will call with results of blood test when completed.

## 2018-12-15 NOTE — Progress Notes (Signed)
Presenting complaint;  Follow-up for hepatitis C cirrhosis. History of hepatocellular carcinoma.  Database and subjective:  Patient is 68 year old Caucasian male who has history of multiple colonic adenomas with last colonoscopy in May 2014.  He was seen in fall of last year for elevated transaminases which she apparently had for few years as well as CT evidence of cirrhosis.  On further work-up he was found to have hepatitis C genotype 1a.  Because of cirrhosis he underwent MRI on 02/17/2018 revealing 2.6 cm lesion within lateral segment of left lobe with features of HCC.  He had 3 additional lesions which were light-RADS 3 and intermediate probability for for [facility].  Patient was referred to Dr. Aletta Edouard and underwent CT-guided percutaneous thermal ablation of left lobe lesion on 06/10/2018. Follow-up MRI on 10/02/2018 revealed no residual or recurrent lesion at site of prior therapy.  Small lesion was noted in segment 8 slightly enlarged compared to previous exam laterad-3 and follow-up recommended.  He also has smaller enhancing lesions in both lobes concerning for dysplastic nodules. He is scheduled to see Dr. Isidore Moos later this month and he believes he would have MRI of the liver prior to that visit.  He has good appetite.  He has gained 3 pounds since his last visit of March 2020.  He is now more than 4 months since he finished antiviral therapy.  He feels heartburn is well controlled with PPI.  He has heartburn only with certain foods that he should not eat to begin with.  He denies abdominal pain dysphagia nausea vomiting melena or rectal bleeding.  His bowels move every day.  He is on 2 L of nasal O2.  He continues to smoke cigars.  He says he smokes 20 cigars a day.  He he gets short of breath on walking more than a block or taking 1 flight of stairs.  He is still drinking beer about 1 can/day.   Current Medications: Outpatient Encounter Medications as of 12/15/2018  Medication Sig  .  albuterol (PROAIR HFA) 108 (90 Base) MCG/ACT inhaler Inhale 2 puffs into the lungs every 6 (six) hours as needed for wheezing.   Marland Kitchen albuterol (PROVENTIL) 4 MG tablet Take 4 mg by mouth 3 (three) times daily.  Marland Kitchen aspirin EC 81 MG tablet Take 81 mg by mouth daily. Take with brilanta  . bisoprolol (ZEBETA) 5 MG tablet Take 0.5 tablets (2.5 mg total) by mouth daily.  . Blood Pressure KIT 1 Units by Does not apply route daily.  . famotidine (PEPCID) 20 MG tablet Take 20 mg by mouth daily as needed for heartburn or indigestion.  . fluticasone (FLONASE) 50 MCG/ACT nasal spray Place 1 spray into both nostrils daily as needed for allergies.   . Multiple Vitamins-Minerals (MULTIVITAMIN WITH MINERALS) tablet Take 1 tablet by mouth daily.  . nitroGLYCERIN (NITROSTAT) 0.4 MG SL tablet PLACE 1 TABLET UNDER THE TONGUE EVERY 5 MINUTES AS NEEDED(FOLLOW MD DIRECTIONS FOR WHEN TO CALL 911)  . OXYGEN Inhale into the lungs as needed. 4 Liters supplemental o2 , as needed "when im up moving around"  . pantoprazole (PROTONIX) 40 MG tablet Take 40 mg by mouth daily.   . Potassium 99 MG TABS Take 1 tablet by mouth daily.   . Tetrahydrozoline HCl (VISINE OP) Place 1 drop into both eyes daily as needed (Dry Eyes).  . ticagrelor (BRILINTA) 90 MG TABS tablet Take 1 tablet (90 mg total) by mouth 2 (two) times daily.  . vitamin E 400 UNIT  capsule Take 1 tablet by mouth daily.   Marland Kitchen ezetimibe (ZETIA) 10 MG tablet Take 1 tablet (10 mg total) by mouth daily.  . TRELEGY ELLIPTA 100-62.5-25 MCG/INH AEPB INHALE 1 PUFF INTO THE LUNGS EVERY DAY (Patient taking differently: Inhale 1 puff into the lungs daily. )  . [DISCONTINUED] Glecaprevir-Pibrentasvir (MAVYRET) 100-40 MG TABS Take 3 tablets by mouth daily.   . [DISCONTINUED] lisinopril (PRINIVIL,ZESTRIL) 2.5 MG tablet Take 1 tablet (2.5 mg total) by mouth daily.   No facility-administered encounter medications on file as of 12/15/2018.      Objective: Blood pressure 133/82, pulse 65,  temperature 97.9 F (36.6 C), temperature source Oral, height 5' 9" (1.753 m), weight 142 lb 8 oz (64.6 kg). Patient is alert and in no acute distress. He does not have asterixis.   He is on nasal O2. Conjunctiva is pink. Sclera is nonicteric Oropharyngeal mucosa is normal. No neck masses or thyromegaly noted. Cardiac exam with regular rhythm normal S1 and S2. No murmur or gallop noted. Lungs are clear to auscultation. Abdomen is symmetrical.  Liver is easily palpable.  Is firm and nontender.  Spleen is nonpalpable. No LE edema or clubbing noted. He has nicotine staining to his fingers and right hand.  Labs/studies Results: Lab data from 06/22/2018 WBC 10.7 H&H 13.6 and 38.8 and platelet count 357K.  Bilirubin 0.7, AP 116, AST 34, ALT 39, total protein 7.2 and albumin 3.8.  AFP was 161.4.  HCVRNA by PCR undetectable.  Assessment:  #1.  Chronic hepatitis C genotype 1a.  He completed antiviral therapy in March this year and HCV RNA was undetectable at end of treatment.  He is due for a follow-up test to document sustained virologic response.  #2.  Cirrhosis secondary to chronic hepatitis C complicated by [place].  He underwent thermal ablation of lesion in left lobe in February 2020.  He has another concerning areas.  Last MRI was in June and he is due for 1 this month.  #3.  History of colonic adenomas.  Timing of next colonoscopy to be determined   Plan:  Once again patient counseled for the need to quit cigarette/cigar smoking. Patient will go to the lab for CBC, LFTs alpha-fetoprotein and HCVRNA. He is to have MRI of the liver later this month prior to his visit with Dr. Eppie Gibson. Office visit in 6 months.

## 2018-12-16 ENCOUNTER — Other Ambulatory Visit: Payer: Self-pay | Admitting: Internal Medicine

## 2018-12-18 LAB — HEPATIC FUNCTION PANEL
AG Ratio: 1.4 (calc) (ref 1.0–2.5)
ALT: 26 U/L (ref 9–46)
AST: 30 U/L (ref 10–35)
Albumin: 4.2 g/dL (ref 3.6–5.1)
Alkaline phosphatase (APISO): 73 U/L (ref 35–144)
Bilirubin, Direct: 0.2 mg/dL (ref 0.0–0.2)
Globulin: 3 g/dL (calc) (ref 1.9–3.7)
Indirect Bilirubin: 0.5 mg/dL (calc) (ref 0.2–1.2)
Total Bilirubin: 0.7 mg/dL (ref 0.2–1.2)
Total Protein: 7.2 g/dL (ref 6.1–8.1)

## 2018-12-18 LAB — CBC
HCT: 44.5 % (ref 38.5–50.0)
Hemoglobin: 14.9 g/dL (ref 13.2–17.1)
MCH: 35.1 pg — ABNORMAL HIGH (ref 27.0–33.0)
MCHC: 33.5 g/dL (ref 32.0–36.0)
MCV: 104.7 fL — ABNORMAL HIGH (ref 80.0–100.0)
MPV: 10.5 fL (ref 7.5–12.5)
Platelets: 305 10*3/uL (ref 140–400)
RBC: 4.25 10*6/uL (ref 4.20–5.80)
RDW: 11.5 % (ref 11.0–15.0)
WBC: 9.6 10*3/uL (ref 3.8–10.8)

## 2018-12-18 LAB — HEPATITIS C RNA QUANTITATIVE
HCV Quantitative Log: <1.18 Log IU/mL
HCV RNA, PCR, QN: <15 IU/mL

## 2018-12-18 LAB — AFP TUMOR MARKER: AFP-Tumor Marker: 17.2 ng/mL — ABNORMAL HIGH (ref <6.1)

## 2018-12-19 DIAGNOSIS — J441 Chronic obstructive pulmonary disease with (acute) exacerbation: Secondary | ICD-10-CM | POA: Diagnosis not present

## 2018-12-22 DIAGNOSIS — I2510 Atherosclerotic heart disease of native coronary artery without angina pectoris: Secondary | ICD-10-CM | POA: Diagnosis not present

## 2018-12-22 DIAGNOSIS — J449 Chronic obstructive pulmonary disease, unspecified: Secondary | ICD-10-CM | POA: Diagnosis not present

## 2018-12-22 DIAGNOSIS — J9611 Chronic respiratory failure with hypoxia: Secondary | ICD-10-CM | POA: Diagnosis not present

## 2018-12-22 DIAGNOSIS — Z23 Encounter for immunization: Secondary | ICD-10-CM | POA: Diagnosis not present

## 2018-12-29 DIAGNOSIS — J449 Chronic obstructive pulmonary disease, unspecified: Secondary | ICD-10-CM | POA: Diagnosis not present

## 2018-12-29 DIAGNOSIS — J9611 Chronic respiratory failure with hypoxia: Secondary | ICD-10-CM | POA: Diagnosis not present

## 2019-01-01 DIAGNOSIS — I2510 Atherosclerotic heart disease of native coronary artery without angina pectoris: Secondary | ICD-10-CM | POA: Diagnosis not present

## 2019-01-01 DIAGNOSIS — J449 Chronic obstructive pulmonary disease, unspecified: Secondary | ICD-10-CM | POA: Diagnosis not present

## 2019-01-01 DIAGNOSIS — R040 Epistaxis: Secondary | ICD-10-CM | POA: Diagnosis not present

## 2019-01-05 DIAGNOSIS — J449 Chronic obstructive pulmonary disease, unspecified: Secondary | ICD-10-CM | POA: Diagnosis not present

## 2019-01-05 DIAGNOSIS — J9611 Chronic respiratory failure with hypoxia: Secondary | ICD-10-CM | POA: Diagnosis not present

## 2019-01-05 DIAGNOSIS — I2510 Atherosclerotic heart disease of native coronary artery without angina pectoris: Secondary | ICD-10-CM | POA: Diagnosis not present

## 2019-01-05 DIAGNOSIS — J301 Allergic rhinitis due to pollen: Secondary | ICD-10-CM | POA: Diagnosis not present

## 2019-01-06 DIAGNOSIS — J449 Chronic obstructive pulmonary disease, unspecified: Secondary | ICD-10-CM | POA: Diagnosis not present

## 2019-01-13 ENCOUNTER — Other Ambulatory Visit: Payer: Self-pay | Admitting: Interventional Radiology

## 2019-01-13 DIAGNOSIS — C220 Liver cell carcinoma: Secondary | ICD-10-CM

## 2019-01-15 ENCOUNTER — Other Ambulatory Visit: Payer: Self-pay | Admitting: Internal Medicine

## 2019-01-18 DIAGNOSIS — J441 Chronic obstructive pulmonary disease with (acute) exacerbation: Secondary | ICD-10-CM | POA: Diagnosis not present

## 2019-01-21 ENCOUNTER — Other Ambulatory Visit: Payer: Self-pay

## 2019-01-21 ENCOUNTER — Ambulatory Visit (HOSPITAL_COMMUNITY)
Admission: RE | Admit: 2019-01-21 | Discharge: 2019-01-21 | Disposition: A | Discharge status: Home / Self Care | Payer: BC Managed Care – PPO | Source: Ambulatory Visit | Attending: Interventional Radiology | Admitting: Interventional Radiology

## 2019-01-21 DIAGNOSIS — C220 Liver cell carcinoma: Secondary | ICD-10-CM | POA: Insufficient documentation

## 2019-01-21 DIAGNOSIS — K7460 Unspecified cirrhosis of liver: Secondary | ICD-10-CM | POA: Diagnosis not present

## 2019-01-21 MED ORDER — GADOBUTROL 1 MMOL/ML IV SOLN
7.0000 mL | Freq: Once | INTRAVENOUS | Status: AC | PRN
  Administered 2019-01-21: 15:00:00 7 mL via INTRAVENOUS

## 2019-01-22 ENCOUNTER — Other Ambulatory Visit: Payer: Self-pay | Admitting: Internal Medicine

## 2019-01-22 LAB — POCT I-STAT CREATININE: Creatinine, Ser: 0.8 mg/dL (ref 0.61–1.24)

## 2019-01-25 ENCOUNTER — Telehealth: Payer: Self-pay | Admitting: Internal Medicine

## 2019-01-25 NOTE — Telephone Encounter (Signed)
Spoke with pt. He is needing a refill on Trelegy. Advised him that he would need an OV before we could refill this for him. States that he will call his PCP and see if they will refill it. Pt will call back if his PCP can't do this for him. Nothing further was needed at this time.

## 2019-01-26 ENCOUNTER — Encounter: Payer: Self-pay | Admitting: *Deleted

## 2019-01-26 ENCOUNTER — Ambulatory Visit
Admission: RE | Admit: 2019-01-26 | Discharge: 2019-01-26 | Disposition: A | Discharge status: Home / Self Care | Payer: BC Managed Care – PPO | Source: Ambulatory Visit | Attending: Interventional Radiology | Admitting: Interventional Radiology

## 2019-01-26 ENCOUNTER — Other Ambulatory Visit: Payer: Self-pay

## 2019-01-26 DIAGNOSIS — C220 Liver cell carcinoma: Secondary | ICD-10-CM | POA: Diagnosis not present

## 2019-01-26 HISTORY — PX: IR RADIOLOGIST EVAL & MGMT: IMG5224

## 2019-01-26 NOTE — Progress Notes (Signed)
Chief Complaint: Patient was consulted remotely today (TeleHealth) for follow up status post microwave thermal ablation of a left lobe hepatocellular carcinoma on 06/10/2018.  History of Present Illness: Adam Bass is a 68 y.o. male with a history of cirrhosis, hepatitis C and status post percutaneous thermal ablation of a 2.5 cm hepatocellular carcinoma of the left lobe of the liver in February.  He did well following the procedure with initial MRI on 10/02/2018 demonstrating a well-circumscribed ablation defect in segment 4A without enhancement.  There is suggestion of slight enlargement of a superior right hepatic enhancing lesion in segment 8 and two smaller enhancing lesions in left and right lobes.  Adam Bass has been doing well at home without any abdominal complaints.  He has been periodically using home oxygen whenever he exerts himself.  He saw Dr. Isidore Moos on 10/27/2018.  He has no evidence of recurrence of left lower lobe lung carcinoma after SBRT.  Dr. Isidore Moos is obtaining a follow-up chest CT in 4 months.  He followed up with Dr. Laural Golden on 12/15/2018.  After completing antiviral therapy, HCV RNA is no longer detected.  AFP level has decreased from 164 down to 17 after thermal ablation.  Past Medical History:  Diagnosis Date   Anticoagulant long-term use    brillinta   Bilateral lower extremity edema    feet, noticed since discharge from hospital admission 05-18-2018, "i was laid up in the bed for five days"    CAD (coronary artery disease) cardiologist-- dr j. branch   hx STEMI 11-23-2017,  s/p  cardiac cath with Thrombectomy, PCI and DES x1 to midRCA, and other nonobstrucitve CAD involving pLAD, OM1, dRCA, posterior atrio, LVEF 45-50%,  LVEDP 27mHg   COPD (chronic obstructive pulmonary disease) with emphysema ([facility]    pulmonology-- dr wert-- GOLD III mix (pt still smokes),  last exacerbation -due to the flu , [place] [facility]yspnea on  [place]ertion)    Ectatic abdominal aorta ([facility]    MRI 11/ 2019   Hepatitis C GI-- dr rLaural Golden  dx 02-02-2018--- currently treated on antiviral medication   [place]ocellular [place]    History of radiation therapy 06/03/17- 06/09/17   Left Lung treated to 54 Gy with 3 fx of 18 Gy. SBRT   History of skin cancer    04/ 2017 excision from face   History of ST elevation myocardial infarction (STEMI) 11/23/2017   inferior wall-- s/p cardiac cath w/ thrombectomy, PCI, and DES   HLD (hyperlipidemia)    Hypertension    Primary squamous cell carcinoma of left lung ([facility] oncologist-- dr sIsidore Moos  dx 02/ 2018-- Stage IA2 Left lower lung,  s/p  Stereotactic radiation completed 06-09-2017   Productive cough    per pt mostly clear   Requires supplemental oxygen    06-09-2018 currently pt is using a portable O2 as needed and at night   S/P drug eluting coronary stent placement 11/23/2017   [facility]  Urinary incontinence    wear depends   Wears dentures    fuller upper,  lower partial   Wears glasses     Past Surgical History:  Procedure Laterality Date   COLONOSCOPY N/A 08/27/2012   Procedure: COLONOSCOPY;  Surgeon: NRogene Houston MD;  Location: [facility];  Service: Endoscopy;  Laterality: N/A;  1200   CORONARY STENT INTERVENTION N/A 11/23/2017   Procedure: CORONARY STENT INTERVENTION;  Surgeon: VJettie Booze  MD;  Location: [facility];  Service: Cardiovascular;  Laterality: N/A;   IR RADIOLOGIST EVAL & MGMT  03/04/2018   IR RADIOLOGIST EVAL & MGMT  10/06/2018   IR RADIOLOGIST EVAL & MGMT  01/26/2019   LEFT HEART CATH AND CORONARY ANGIOGRAPHY N/A 11/23/2017   Procedure: LEFT HEART CATH AND CORONARY ANGIOGRAPHY;  Surgeon: Jettie Booze, MD;  Location: [place] [facility] LAB;  Service: Cardiovascular;  Laterality: N/A;   RADIOFREQUENCY ABLATION N/A 06/10/2018   Procedure: MICROWAVE THERMAL ABLATION LIVER;  Surgeon: Aletta Edouard, MD;  Location:  [facility];  Service: Anesthesiology;  Laterality: N/A;    Allergies: Bee venom  Medications: Prior to Admission medications   Medication Sig Start Date End Date Taking? Authorizing Provider  albuterol (PROAIR HFA) 108 (90 Base) MCG/ACT inhaler Inhale 2 puffs into the lungs every 6 (six) hours as needed for wheezing.     [provider]  albuterol (PROVENTIL) 4 MG tablet Take 4 mg by mouth 3 (three) times daily.    [provider]  aspirin EC 81 MG tablet Take 81 mg by mouth daily. Take with brilanta    [provider]  bisoprolol (ZEBETA) 5 MG tablet Take 0.5 tablets (2.5 mg total) by mouth daily. 11/28/17   [name] B, NP  Blood Pressure KIT 1 Units by Does not apply route daily. 11/27/17   Cheryln Manly, NP  ezetimibe (ZETIA) 10 MG tablet Take 1 tablet (10 mg total) by mouth daily. 11/27/17 06/22/18  Cheryln Manly, NP  famotidine (PEPCID) 20 MG tablet Take 20 mg by mouth daily as needed for heartburn or indigestion.    [provider]  fluticasone (FLONASE) 50 MCG/ACT nasal spray Place 1 spray into both nostrils daily as needed for allergies.     [provider]  Multiple Vitamins-Minerals (MULTIVITAMIN WITH MINERALS) tablet Take 1 tablet by mouth daily.    [provider]  nitroGLYCERIN (NITROSTAT) 0.4 MG SL tablet PLACE 1 TABLET UNDER THE TONGUE EVERY 5 MINUTES AS NEEDED(FOLLOW MD DIRECTIONS FOR WHEN TO CALL 911) 09/21/18   [name], NP  OXYGEN Inhale into the lungs as needed. 4 Liters supplemental o2 , as needed "when im up moving around"    [provider]  pantoprazole (PROTONIX) 40 MG tablet Take 40 mg by mouth daily.  02/02/18   [provider]  Potassium 99 MG TABS Take 1 tablet by mouth daily.     [provider]  Tetrahydrozoline HCl (VISINE OP) Place 1 drop into both eyes daily as needed (Dry Eyes).    [provider]  ticagrelor (BRILINTA) 90 MG TABS tablet Take 1 tablet (90  mg total) by mouth 2 (two) times daily. 11/27/17   Cheryln Manly, NP  TRELEGY ELLIPTA 100-62.5-25 MCG/INH AEPB INHALE 1 PUFF INTO THE LUNGS EVERY DAY 12/16/18   Tanda Rockers, MD  vitamin E 400 UNIT capsule Take 1 tablet by mouth daily.     [provider]     Family History  Problem Relation Age of Onset   Breast cancer Sister    Colon cancer Neg Hx     Social History   Socioeconomic History   Marital status: Divorced    Spouse name: Not on file   Number of children: Not on file   Years of education: Not on file   Highest education level: Not on file  Occupational History   Not on file  Social Needs  Financial resource strain: Not on file   Food insecurity    Worry: Not on file    Inability: Not on file   Transportation needs    Medical: No    Non-medical: No  Tobacco Use   Smoking status: Current Every Day Smoker    Packs/day: 0.75    Years: 50.00    Pack years: 37.50    Types: Cigars   Smokeless tobacco: Never Used   Tobacco comment: . He is smoking about 5-10 cigarettes daily.   Substance and Sexual Activity   Alcohol use: Yes    Alcohol/week: 8.0 standard drinks    Types: 8 Cans of beer per week    Comment: 8 beers weekly, give or take    Drug use: No   Sexual activity: Not on file  Lifestyle   Physical activity    Days per week: Not on file    Minutes per session: Not on file   Stress: Not on file  Relationships   Social connections    Talks on phone: Not on file    Gets together: Not on file    Attends religious service: Not on file    Active member of club or organization: Not on file    Attends meetings of clubs or organizations: Not on file    Relationship status: Not on file  Other Topics Concern   Not on file  Social History Narrative   Not on file    ECOG Status: 0 - Asymptomatic  Review of Systems  Constitutional: Negative.   Respiratory:       Some dyspnea with exertion.  Cardiovascular: Negative.    Gastrointestinal: Negative.   Genitourinary: Negative.   Musculoskeletal: Negative.   Neurological: Negative.     Review of Systems: A 12 point ROS discussed and pertinent positives are indicated in the HPI above.  All other systems are negative.  Physical Exam No direct physical exam was performed (except for noted visual exam findings with Video Visits).   Vital Signs: There were no vitals taken for this visit.  Imaging: Mr Abdomen Wwo Contrast  Result Date: 01/21/2019 CLINICAL DATA:  History of HCV cirrhosis and beaded if hepatocellular carcinoma in the left liver lobe status post percutaneous thermal ablation 06/10/2018. Restaging. Additional history of left lung cancer. EXAM: MRI ABDOMEN WITHOUT AND WITH CONTRAST TECHNIQUE: Multiplanar multisequence MR imaging of the abdomen was performed both before and after the administration of intravenous contrast. CONTRAST:  42m GADAVIST GADOBUTROL 1 MMOL/ML IV SOLN COMPARISON:  10/02/2018 MRI abdomen. FINDINGS: Examination is significantly limited by motion degradation. Lower chest: No acute abnormality at the lung bases. Hepatobiliary: Finely irregular liver surface with relative hypertrophy of the lateral segment left liver lobe, compatible with hepatic cirrhosis. No hepatic steatosis. Liver lesions as follows: -anterior left liver lobe 3.5 x 2.9 cm ablation defect with heterogeneous precontrast T1 hyperintensity (series 12/image 32), decreased from 3.9 x 3.2 cm on 10/02/2018 MRI, with no appreciable enhancement -small foci of arterial phase hyperenhancement measuring 0.6 cm in the segment 3 left liver lobe (series 13/image 42), 1.2 cm in the segment 4B left liver lobe (series 13/image 45) and 0.7 cm in the segment 8 right liver lobe (series 13/image 30), unchanged and without washout or capsular enhancement, considered LR-3 ([facility] No new liver lesions. Normal gallbladder with no cholelithiasis. No biliary ductal  dilatation. Common bile duct diameter 3 mm. No evidence of choledocholithiasis. Pancreas: No pancreatic mass or duct dilation.  No  pancreas divisum. Spleen: Normal size. No mass. Adrenals/Urinary Tract: Normal adrenals. No hydronephrosis. Partially exophytic 2.4 x 2.1 cm renal cortical lesion in the posterior upper left kidney with layering T1 hyperintense material (series 21/image 40) without appreciable enhancement on the motion degraded postcontrast sequences, unchanged in size, compatible with Bosniak category 2 hemorrhagic/proteinaceous renal cyst. Additional subcentimeter T1 hyperintense renal cortical lesions in both kidneys are too small to accurately characterize on this motion degraded scan, unchanged in size. Additional simple renal cysts scattered in both kidneys, largest 1.4 cm in the upper right kidney. No overtly suspicious renal masses. Stomach/Bowel: Normal non-distended stomach. Visualized small and large bowel is normal caliber, with no bowel wall thickening. Vascular/Lymphatic: Atherosclerotic abdominal aorta with ectatic 2.9 cm infrarenal abdominal aorta, unchanged. Patent portal, splenic, hepatic and renal veins. No pathologically enlarged lymph nodes in the abdomen. Other: No abdominal ascites or focal fluid collection. Musculoskeletal: No aggressive appearing focal osseous lesions. IMPRESSION: 1. Cirrhosis. No evidence of recurrent tumor at the ablation site in the anterior left liver lobe. 2. Small scattered liver foci of arterial phase hyperenhancement are all stable, considered LR-3 (intermediate probability for [facility]). No new liver lesions. Continued MRI surveillance warranted in 6 months. 3. No findings of metastatic disease in the abdomen. 4. Ectatic infrarenal 2.9 cm ectatic abdominal aorta, at risk for aneurysm development. Recommend follow-up aortic ultrasound in 5 years. This recommendation follows ACR consensus guidelines: White Paper of the ACR Incidental Findings Committee II on  Vascular Findings. J Am Coll Radiol 2013; 10:789-794. Electronically Signed   By: Ilona Sorrel M.D.   On: 01/21/2019 16:25   Ir Radiologist Eval & Mgmt  Result Date: 01/26/2019 Please refer to notes tab for details about interventional procedure. (Op Note)   Labs:  CBC: Recent Labs    06/10/18 0740 06/11/18 0317 06/22/18 1222 12/15/18 1124  WBC 14.8* 12.0* 10.7 9.6  HGB 13.3 11.3* 13.6 14.9  HCT 41.3 35.3* 38.8 44.5  PLT 346 246 357 305    COAGS: Recent Labs    02/20/18 1051 05/27/18 0736 06/10/18 0740  INR 1.1 0.98 0.9  APTT  --   --  26    BMP: Recent Labs    05/22/18 1413 05/27/18 0736 06/09/18 1455 06/11/18 0317 10/02/18 1117 01/21/19 1432  NA 134* 136 138 140  --   --   K 4.7 3.7 3.6 3.9  --   --   CL 98 99 99 103  --   --   CO2 29 30 32 32  --   --   GLUCOSE 116* 96 148* 95  --   --   BUN _0 --   --   CALCIUM 8.9 7.9* 8.5* 8.1*  --   --   CREATININE 0.64 0.59* 0.82 0.81 0.70 0.80  GFRNONAA >60 >60 >60 >60  --   --   GFRAA >60 >60 >60 >60  --   --     LIVER FUNCTION TESTS: Recent Labs    05/22/18 1413 05/27/18 0736 06/09/18 1455 06/11/18 0317 06/22/18 1222 12/15/18 1124  BILITOT 1.4* 1.1 0.8 0.8 0.7 0.7  AST 32 27 28 144* 34 30  ALT 46* 34 34 121* 39 26  ALKPHOS 60 82 71 71  --   --   PROT 6.5 6.5 7.3 5.9* 7.2 7.2  ALBUMIN 3.6 3.4* 4.1 3.1*  --   --     TUMOR MARKERS: Recent Labs    02/02/18 1426 06/22/18 1222 12/15/18  1124  AFPTM 163.9* 161.4* 17.2*    Assessment and Plan:  I spoke with Adam Bass by phone.  I reviewed findings from the most recent follow-up MRI of the abdomen dated 01/21/2019 with him.  The anterior left hepatic ablation defect shows slight decrease in size since the prior MRI in June with no appreciable internal enhancement to suggest carcinoma recurrence after ablation.  Three small foci of arterial phase hyperenhancement measure 0.6 cm in segment 3, 1.2 cm in segment 4B and 0.7 cm in segment 8 which  are considered LR-3 lesions and do not show significant increase in size since prior MRI.  The superior segment 8 lesion in particular appears somewhat smaller than on the prior MRI and does not appear to be definitively enlarging over the last year.  It is also encouraging that the AFP level has decreased substantially after ablation.  I recommended that we continue to follow the 3 foci of arterial enhancement with another MRI in approximately 6 months.  I imagine that Dr. Laural Golden will also be checking his AFP level around that time.   Electronically Signed: Azzie Roup 01/26/2019, 1:56 PM     I spent a total of 15 Minutes in remote  clinical consultation, greater than 50% of which was counseling/coordinating care post ablation of a left lobe hepatocellular carcinoma.    Visit type: Audio only (telephone). Audio (no video) only due to patient's lack of internet/smartphone capability.  The patient had Internet connection difficulty at the time of the meeting and could not meet by video conference software. Alternative for in-person consultation at [facility], [address], [place], [place]. This visit type was conducted due to national recommendations for restrictions regarding the COVID-19 Pandemic (e.g. social distancing).  This format is felt to be most appropriate for this patient at this time.  All issues noted in this document were discussed and addressed.

## 2019-01-28 DIAGNOSIS — J9611 Chronic respiratory failure with hypoxia: Secondary | ICD-10-CM | POA: Diagnosis not present

## 2019-01-28 DIAGNOSIS — J449 Chronic obstructive pulmonary disease, unspecified: Secondary | ICD-10-CM | POA: Diagnosis not present

## 2019-02-02 ENCOUNTER — Encounter: Payer: Self-pay | Admitting: Cardiology

## 2019-02-02 ENCOUNTER — Other Ambulatory Visit: Payer: Self-pay

## 2019-02-02 ENCOUNTER — Ambulatory Visit: Payer: BC Managed Care – PPO | Admitting: Cardiology

## 2019-02-02 VITALS — BP 146/77 | HR 88 | Temp 97.0°F | Ht 69.0 in | Wt 145.0 lb

## 2019-02-02 DIAGNOSIS — E782 Mixed hyperlipidemia: Secondary | ICD-10-CM

## 2019-02-02 DIAGNOSIS — I2510 Atherosclerotic heart disease of native coronary artery without angina pectoris: Secondary | ICD-10-CM | POA: Diagnosis not present

## 2019-02-02 NOTE — Patient Instructions (Signed)
Medication Instructions:  STOP Brilinta *If you need a refill on your cardiac medications before your next appointment, please call your pharmacy*  Lab Work: None today If you have labs (blood work) drawn today and your tests are completely normal, you will receive your results only by: Marland Kitchen MyChart Message (if you have MyChart) OR . A paper copy in the mail If you have any lab test that is abnormal or we need to change your treatment, we will call you to review the results.  Testing/Procedures: None today  Follow-Up: At [facility], you and your health needs are our priority.  As part of our continuing mission to provide you with exceptional heart care, we have created designated Provider Care Teams.  These Care Teams include your primary Cardiologist (physician) and Advanced Practice Providers (APPs -  Physician Assistants and Nurse Practitioners) who all work together to provide you with the care you need, when you need it.  Your next appointment:   6 months  The format for your next appointment:   In Person  Provider:   Carlyle Dolly, MD  Other Instructions None

## 2019-02-02 NOTE — Progress Notes (Signed)
Clinical Summary Adam Bass is a 68 y.o.male seen today for follow up of the following medical problems.     1. CAD - admit 11/2017 with inferior STEMI, mid RCA 100% lesion received thrombectomy and DES - 11/2017 echo LVEF 55-60% - medical therapy limited by soft bp's. Started on high dost statin but had LFT elevation and discontinued. Started on zetia 7m daily.   - no recent chest pain - compliant with meds. - had been on statin but had elevated LFTs, he was subsequently diagnosed with cirrhosis and HCC, not clear if statin was the true issue. Has been on zetia  2.Tobacco history/COPD - followed by pcp  3. Liver mass/Hepatocellular carcinoma - followed by GI    5. Ectatic abdominal aorta - noted on 02/2018 MRI abdomen  6. Lung cancer - followed by oncology, stable recent scan and in remission.    7. Hep C  Past Medical History:  Diagnosis Date   Anticoagulant long-term use    brillinta   Bilateral lower extremity edema    feet, noticed since discharge from hospital admission 05-18-2018, "i was laid up in the bed for five days"    CAD (coronary artery disease) cardiologist-- dr j. Garey Alleva   hx STEMI 11-23-2017,  s/p  cardiac cath with Thrombectomy, PCI and DES x1 to midRCA, and other nonobstrucitve CAD involving pLAD, OM1, dRCA, posterior atrio, LVEF 45-50%,  LVEDP 255mg   [facility]   [facility]-- dr wert-- GOLD III mix (pt still smokes),  last exacerbation -due to the flu , [facility]    [facility]   [facility]   Hepatitis C GI-- dr reLaural Golden dx 02-02-2018--- currently treated on antiviral medication   [place]   History of radiation therapy 06/03/17- 06/09/17   Left Lung treated to 54 Gy with 3 fx of 18 Gy. SBRT   History of skin cancer    04/ 2017 excision from face   History of ST  elevation myocardial infarction (STEMI) 11/23/2017   inferior wall-- s/p cardiac cath w/ thrombectomy, PCI, and DES   HLD (hyperlipidemia)    Hypertension    [facility] dr sqIsidore Moos dx 02/ 2018-- Stage IA2 Left lower lung,  s/p  Stereotactic radiation completed 06-09-2017   Productive cough    per pt mostly clear   Requires supplemental oxygen    06-09-2018 currently pt is using a portable O2 as needed and at night   S/P drug eluting coronary stent placement 11/23/2017   [facility]    wear depends   Wears dentures    fuller upper,  lower partial   Wears glasses      Allergies  Allergen Reactions   Bee Venom Hives     Current Outpatient Medications  Medication Sig Dispense Refill   albuterol (PROAIR HFA) 108 (90 Base) MCG/ACT inhaler Inhale 2 puffs into the lungs every 6 (six) hours as needed for wheezing.      albuterol (PROVENTIL) 4 MG tablet Take 4 mg by mouth 3 (three) times daily.     aspirin EC 81 MG tablet Take 81 mg by mouth daily. Take with brilanta     bisoprolol (ZEBETA) 5 MG tablet Take 0.5 tablets (2.5 mg total) by mouth daily. 60 tablet  1   Blood Pressure KIT 1 Units by Does not apply route daily. 1 each 0   ezetimibe (ZETIA) 10 MG tablet Take 1 tablet (10 mg total) by mouth daily. 90 tablet 1   famotidine (PEPCID) 20 MG tablet Take 20 mg by mouth daily as needed for heartburn or indigestion.     fluticasone (FLONASE) 50 MCG/ACT nasal spray Place 1 spray into both nostrils daily as needed for allergies.      Multiple Vitamins-Minerals (MULTIVITAMIN WITH MINERALS) tablet Take 1 tablet by mouth daily.     nitroGLYCERIN (NITROSTAT) 0.4 MG SL tablet PLACE 1 TABLET UNDER THE TONGUE EVERY 5 MINUTES AS NEEDED(FOLLOW MD DIRECTIONS FOR WHEN TO CALL 911) 25 tablet 3   OXYGEN Inhale into the lungs as needed. 4 Liters supplemental o2 , as needed "when im up moving around"      pantoprazole (PROTONIX) 40 MG tablet Take 40 mg by mouth daily.   5   Potassium 99 MG TABS Take 1 tablet by mouth daily.      Tetrahydrozoline HCl (VISINE OP) Place 1 drop into both eyes daily as needed (Dry Eyes).     ticagrelor (BRILINTA) 90 MG TABS tablet Take 1 tablet (90 mg total) by mouth 2 (two) times daily. 180 tablet 2   TRELEGY ELLIPTA 100-62.5-25 MCG/INH AEPB INHALE 1 PUFF INTO THE LUNGS EVERY DAY 60 each 0   vitamin E 400 UNIT capsule Take 1 tablet by mouth daily.      No current facility-administered medications for this visit.      Past Surgical History:  Procedure Laterality Date   COLONOSCOPY N/A 08/27/2012   Procedure: COLONOSCOPY;  Surgeon: Rogene Houston, MD;  Location[facility];  Service: Endoscopy;  Laterality: N/A;  1200   CORONARY STENT INTERVENTION N/A 11/23/2017   Procedure: CORONARY STENT INTERVENTION;  Surgeon: Jettie Booze, MD;  Location: [facility];  Service: Cardiovascular;  Laterality: N/A;   IR RADIOLOGIST EVAL & MGMT  03/04/2018   IR RADIOLOGIST EVAL & MGMT  10/06/2018   IR RADIOLOGIST EVAL & MGMT  01/26/2019   LEFT HEART CATH AND CORONARY ANGIOGRAPHY N/A 11/23/2017   Procedure: LEFT HEART CATH AND CORONARY ANGIOGRAPHY;  Surgeon: Jettie Booze, MD;  Location: [place] [facility];  Service: Cardiovascular;  Laterality: N/A;   RADIOFREQUENCY ABLATION N/A 06/10/2018   Procedure: MICROWAVE THERMAL ABLATION LIVER;  Surgeon: Aletta Edouard, MD;  Location: [facility];  Service: Anesthesiology;  Laterality: N/A;     Allergies  Allergen Reactions   Bee Venom Hives      Family History  Problem Relation Age of Onset   Breast cancer Sister    Colon cancer Neg Hx      Social History Adam Bass reports that he has been smoking cigars. He has a 37.50 pack-year smoking history. He has never used smokeless tobacco. Adam Bass reports current alcohol use of about 8.0 standard drinks of alcohol per week.   Review of  Systems CONSTITUTIONAL: No weight loss, fever, chills, weakness or fatigue.  HEENT: Eyes: No visual loss, blurred vision, double vision or yellow sclerae.No hearing loss, sneezing, congestion, runny nose or sore throat.  SKIN: No rash or itching.  CARDIOVASCULAR: per hpi RESPIRATORY: No shortness of breath, cough or sputum.  GASTROINTESTINAL: No anorexia, nausea, vomiting or diarrhea. No abdominal pain or blood.  GENITOURINARY: No burning on urination, no polyuria NEUROLOGICAL: No headache, dizziness, syncope, paralysis, ataxia, numbness or tingling in the extremities. No change  in bowel or bladder control.  MUSCULOSKELETAL: No muscle, back pain, joint pain or stiffness.  LYMPHATICS: No enlarged nodes. No history of splenectomy.  PSYCHIATRIC: No history of depression or anxiety.  ENDOCRINOLOGIC: No reports of sweating, cold or heat intolerance. No polyuria or polydipsia.  Marland Kitchen   Physical Examination Today's Vitals   02/02/19 1344  BP: (!) 146/77  Pulse: 88  Temp: (!) 97 F (36.1 C)  SpO2: 92%  Weight: 145 lb (65.8 kg)  Height: 5' 9"  (1.753 m)   Body mass index is 21.41 kg/m.  Gen: resting comfortably, no acute distress HEENT: no scleral icterus, pupils equal round and reactive, no palptable cervical adenopathy,  CV: RRR, no m/r/g, no jvd Resp: Clear to auscultation bilaterally GI: abdomen is soft, non-tender, non-distended, normal bowel sounds, no hepatosplenomegaly MSK: extremities are warm, no edema.  Skin: warm, no rash Neuro:  no focal deficits Psych: appropriate affect    Assessment and Plan   1. CAD -no symptmos. Soft bp's limit medication, also has not been on statin previously due to liver disease - stop brillinta, has completed 1 year    2. Hyperlipidemia - check with GI if ok to start statin, has been on zetia     F/u 6 months     Arnoldo Lenis, M.D.

## 2019-02-05 DIAGNOSIS — J449 Chronic obstructive pulmonary disease, unspecified: Secondary | ICD-10-CM | POA: Diagnosis not present

## 2019-02-06 ENCOUNTER — Encounter (INDEPENDENT_AMBULATORY_CARE_PROVIDER_SITE_OTHER): Payer: Self-pay

## 2019-02-17 ENCOUNTER — Telehealth: Payer: Self-pay | Admitting: *Deleted

## 2019-02-17 NOTE — Telephone Encounter (Signed)
Called patient to inform of Ct for 03-01-19- arrival time - 5:45 pm @ [facility] Radiology, no restrictions to test, and fu with Dr. Isidore Moos for results on 03-02-19 @ 2:20 pm, spoke with patient and he is aware of these appts.

## 2019-02-18 DIAGNOSIS — J441 Chronic obstructive pulmonary disease with (acute) exacerbation: Secondary | ICD-10-CM | POA: Diagnosis not present

## 2019-02-26 ENCOUNTER — Telehealth: Payer: Self-pay

## 2019-02-26 DIAGNOSIS — E782 Mixed hyperlipidemia: Secondary | ICD-10-CM

## 2019-02-26 MED ORDER — ATORVASTATIN CALCIUM 20 MG PO TABS: 20.0000 mg | ORAL_TABLET | Freq: Every day | ORAL | 3 refills | Status: DC

## 2019-02-26 NOTE — Telephone Encounter (Signed)
-----   Message from Arnoldo Lenis, MD sent at 02/25/2019  9:49 AM EST ----- Regarding: FW: Statin use Can we let patient know we heard back from Dr Laural Golden, he is ok with starting a statin. Can he start atorvastatin 20mg  daily, needs CMET in 1 month along with fasting lipid panel. Continue zetia for now but may be able to stop in the future   Zandra Abts MD ----- Message ----- From: Rogene Houston, MD Sent: 02/05/2019   1:06 PM EST To: Arnoldo Lenis, MD Subject: RE: Statin use                                 Hello Jonathon,  He has well-preserved hepatic function. No contraindication to using statin; any agent that you prefer is fine with me. Follow-up LFTs indicated as for the patient's who begin statin in 4 to 6 weeks. Najeeb ----- Message ----- From: Arnoldo Lenis, MD Sent: 02/02/2019   2:27 PM EDT To: Rogene Houston, MD Subject: Statin use                                     Najeeb,  Mutual patient of ours, given his liver history what are your thoughts about him starting a statin? If open to it would you favor one easier on the liver like pravastatin.   Zandra Abts MD

## 2019-02-28 DIAGNOSIS — J9611 Chronic respiratory failure with hypoxia: Secondary | ICD-10-CM | POA: Diagnosis not present

## 2019-02-28 DIAGNOSIS — J449 Chronic obstructive pulmonary disease, unspecified: Secondary | ICD-10-CM | POA: Diagnosis not present

## 2019-03-01 ENCOUNTER — Other Ambulatory Visit: Payer: Self-pay

## 2019-03-01 ENCOUNTER — Ambulatory Visit (HOSPITAL_COMMUNITY)
Admission: RE | Admit: 2019-03-01 | Discharge: 2019-03-01 | Disposition: A | Discharge status: Home / Self Care | Payer: BC Managed Care – PPO | Source: Ambulatory Visit | Attending: Radiation Oncology | Admitting: Radiation Oncology

## 2019-03-01 DIAGNOSIS — C3432 Malignant neoplasm of lower lobe, left bronchus or lung: Secondary | ICD-10-CM | POA: Diagnosis not present

## 2019-03-01 NOTE — Progress Notes (Signed)
Radiation Oncology         [phone]) [phone] ________________________________  Name: Adam Bass MRN: [id number]  Date: 03/02/2019  DOB: 1950-11-05  Follow-Up Visit Note by MyChart video due to pandemic precautions  Outpatient  CC: Sinda Du, MD  Hildred Laser, MD  Diagnosis and Prior Radiotherapy:    ICD-10-CM   1. 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 He underwent chest CT yesterday, 03/01/2019.  This is notable for good treatment response to the previously treated lesion in the left lower lung and stability of the lesion in the left upper lung which was biopsied 2 years ago and negative for carcinoma.  He also has a slowly growing 8 mm (mean diameter) nodule in the left lateral upper lung which is suspicious for cancer.  Since his last visit, he also underwent restaging abdomen MRI on 01/21/2019, which showed: cirrhosis, no evidence of recurrent tumor at the ablation site in the anterior left liver lobe; stable small scattered liver foci of arterial phase hyperenhancement, no new liver lesions; no findings of metastatic disease in the abdomen.  He continues to undergo close surveillance for this. He last followed up with Dr. Kathlene Cote on 01/26/2019 to discuss the recent abdomen MRI. Per Dr. Margaretmary Dys note, recommended to undergo repeat MRI in approximately 6 months.  The patient is on home oxygen which he uses during mild exertion.  ALLERGIES:   is allergic to bee venom.  Meds: Current Outpatient Medications  Medication Sig Dispense Refill   albuterol (PROAIR HFA) 108 (90 Base) MCG/ACT inhaler Inhale 2 puffs into the lungs every 6 (six) hours as needed for wheezing.      albuterol (PROVENTIL) 4 MG tablet Take 4 mg by mouth 3 (three) times daily.     aspirin EC 81 MG tablet Take 81 mg by mouth daily. Take with brilanta     atorvastatin (LIPITOR) 20 MG tablet Take 1 tablet (20 mg total) by mouth daily. 90 tablet 3   bisoprolol (ZEBETA) 5 MG tablet Take 0.5 tablets (2.5 mg total) by mouth daily. 60 tablet 1   Blood Pressure KIT 1 Units by Does not apply route daily. 1 each 0   famotidine (PEPCID) 20 MG tablet Take 20 mg by mouth daily as needed for heartburn or indigestion.     fluticasone (FLONASE) 50 MCG/ACT nasal spray Place 1 spray into both nostrils daily as needed for allergies.      Multiple Vitamins-Minerals (MULTIVITAMIN WITH MINERALS) tablet Take 1 tablet by mouth daily.     nitroGLYCERIN (NITROSTAT) 0.4 MG SL tablet PLACE 1 TABLET UNDER THE TONGUE EVERY 5 MINUTES AS NEEDED(FOLLOW MD DIRECTIONS FOR WHEN TO CALL 911) 25 tablet 3   OXYGEN Inhale into the lungs as needed. 4 Liters supplemental o2 , as needed "when im up moving around"     pantoprazole (PROTONIX) 40 MG tablet Take 40 mg by mouth daily.  5   Potassium 99 MG TABS Take 1 tablet by mouth daily.      Tetrahydrozoline HCl (VISINE OP) Place 1 drop into both eyes daily as needed (Dry Eyes).     TRELEGY ELLIPTA 100-62.5-25 MCG/INH AEPB INHALE 1 PUFF INTO THE LUNGS EVERY DAY 60 each 0   vitamin E 400 UNIT capsule Take 1 tablet by mouth daily.      ezetimibe (ZETIA) 10 MG tablet Take 1 tablet (10 mg total) by mouth daily. 90 tablet 1   No current facility-administered medications for this encounter.     Physical Findings: The patient is in no acute distress. Patient is alert and oriented.  vitals were not taken for this visit. .     Lab  Findings: Lab Results  Component Value Date   WBC 9.6 12/15/2018   HGB 14.9 12/15/2018   HCT 44.5 12/15/2018   MCV 104.7 (H) 12/15/2018   PLT 305 12/15/2018    Radiographic Findings: Ct Chest Wo Contrast  Result Date: 03/02/2019 CLINICAL DATA:  Left lower lung carcinoma.  Status post SBRT. EXAM: CT CHEST WITHOUT CONTRAST TECHNIQUE: Multidetector CT imaging of the chest was performed following the standard protocol without IV contrast. COMPARISON:  10/26/2018 FINDINGS: Cardiovascular: The heart size appears within normal limits. No pericardial effusion. Aortic atherosclerosis. Three vessel coronary artery atherosclerotic calcifications. Mediastinum/Nodes: No enlarged mediastinal or axillary lymph nodes. Thyroid gland, trachea, and esophagus demonstrate no significant findings. Lungs/Pleura: No pleural effusion identified. Advanced changes of emphysema. No airspace consolidation, atelectasis or pneumothorax identified. Index lesion within the left upper lobe measures a 1.5 x 1.1 cm, image 26/4. Unchanged. Index lesion within the left lower lobe measures 0.7 x 0.5 cm, image 107/4. Previously this measured the same. Index nodule in the lateral left upper lobe measures 1.0 x 0.7 cm, mean diameter 8 mm, image 90/4. Previously this had a mean diameter of 5 mm. Upper Abdomen: Cirrhotic appearing liver is again noted with ablation defect involving the left hepatic lobe. This is incompletely characterized without IV contrast. Musculoskeletal: No chest wall mass or suspicious bone lesions identified. IMPRESSION: 1. Stable treated lesions within the left upper lobe and left lower lobe. 2. Enlarging lesion within the left upper lobe is identified on today's exam with a mean diameter of 8 mm. On the exam from 10/26/2018 this nodule was new measuring 5 mm. Findings is concerning for new site of disease. 3. Similar appearance of cirrhotic liver with stable left hepatic lobe ablation defect. 4. Aortic Atherosclerosis  (ICD10-I70.0) and Emphysema (ICD10-J43.9). 5. Coronary artery calcifications. Electronically Signed   By: Kerby Moors M.D.   On: 03/02/2019 09:13    Impression/Plan:  This is a very nice gentleman with a history of left lung cancer, Lower lobe; responded well to SBRT. Scan is notable for overall stability but he has a slowly growing nodule in the left upper lobe, laterally, now measuring 8 mm in mean diameter.  We discussed the pros and cons of performing further procedures at this time versus keeping a close eye on this.  He is in favor of following up with me in 3 months with a repeat chest CT scan.  If there is obvious progression at that time we may more seriously consider acting up on this small nodule.  I will schedule his next CT of his chest to take place in 3 months with follow-up soon after.  He knows to call if he has any issues in the interim.  This encounter was  provided by telemedicine platform MyChart video The patient has given verbal consent for this type of encounter and has been advised to only accept a meeting of this type in a secure network environment. The time spent during this encounter was over 10 minutes. The attendants for this meeting include Eppie Gibson  and [name].  During the encounter, Eppie Gibson was located at [facility] Radiation Oncology Department.  [name] was located at home.   ___________________________________   Eppie Gibson, MD  This document serves as a record of services personally performed by Eppie Gibson, MD. It was created on her behalf by Wilburn Mylar, a trained medical scribe. The creation of this record is based on the scribe's personal observations and the provider's statements to them. This document has been checked and approved by the attending provider.

## 2019-03-02 ENCOUNTER — Encounter: Payer: Self-pay | Admitting: Radiation Oncology

## 2019-03-02 ENCOUNTER — Ambulatory Visit
Admission: RE | Admit: 2019-03-02 | Discharge: 2019-03-02 | Disposition: A | Discharge status: Home / Self Care | Payer: BC Managed Care – PPO | Source: Ambulatory Visit | Attending: Radiation Oncology | Admitting: Radiation Oncology

## 2019-03-02 DIAGNOSIS — R918 Other nonspecific abnormal finding of lung field: Secondary | ICD-10-CM | POA: Diagnosis not present

## 2019-03-02 DIAGNOSIS — Z08 Encounter for follow-up examination after completed treatment for malignant neoplasm: Secondary | ICD-10-CM | POA: Diagnosis not present

## 2019-03-02 DIAGNOSIS — R911 Solitary pulmonary nodule: Secondary | ICD-10-CM

## 2019-03-02 DIAGNOSIS — Z85118 Personal history of other malignant neoplasm of bronchus and lung: Secondary | ICD-10-CM | POA: Diagnosis not present

## 2019-03-02 DIAGNOSIS — C3432 Malignant neoplasm of lower lobe, left bronchus or lung: Secondary | ICD-10-CM

## 2019-03-08 DIAGNOSIS — J449 Chronic obstructive pulmonary disease, unspecified: Secondary | ICD-10-CM | POA: Diagnosis not present

## 2019-03-08 IMAGING — CT NM PET TUM IMG RESTAG (PS) SKULL BASE T - THIGH
1 of 8 series · 1 of 25 positions shown · non-contrast
Comparison: PET-CT 10/02/2016

CLINICAL DATA: Subsequent treatment strategy for pulmonary nodules.

EXAM:
NUCLEAR MEDICINE PET SKULL BASE TO THIGH
TECHNIQUE: 104 mCi F-18 FDG was injected intravenously. Full-ring PET imaging
was performed from the skull base to thigh after the radiotracer. CT
data was obtained and used for attenuation correction and anatomic
localization.
FASTING BLOOD GLUCOSE:  Value: 104 mg/dl

[Series 4: ct sk_thigh 5.0 b31f · axial · 5.0mm · 0.98mm/px · 1 of 231 slices shown]
[im 231/231  brain]
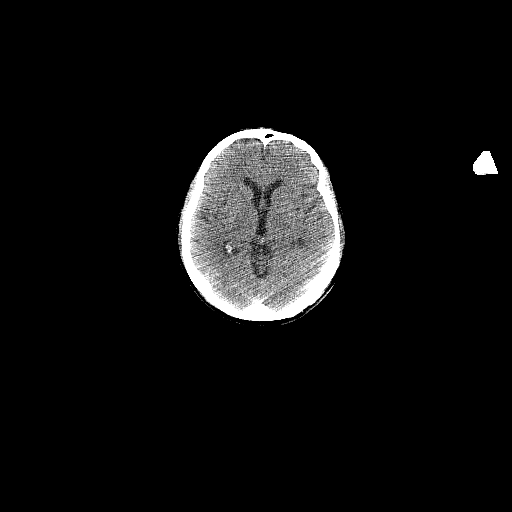

[1 of 25 positions shown; findings below may reference images not displayed]

FINDINGS: NECK

No hypermetabolic lymph nodes in the neck. Hypermetabolic nodules
within LEFT (14 mm) and RIGHT (14 mm) parotid glands with SUV max
equals

CHEST

Hypermetabolic nodule in the LEFT upper lobe is decreased activity
compared to prior with SUV max equal 3.6 compared with 4.5. CT
imaging lesion appears similar measuring 22 by 14 mm (image 53,
series 4) compared with 21 mm x 10 mm on prior

Lesion in the LEFT lower lobe measures 13 mm in greatest dimension
compared 8 mm on prior. This nodule has SUV max equal 2.9 increased
from SUV max equal 1.7.

No new pulmonary nodules are present.

ABDOMEN/PELVIS

No abnormal hypermetabolic activity within the liver, pancreas,
adrenal glands, or spleen. No hypermetabolic lymph nodes in the
abdomen or pelvis.

SKELETON

No focal hypermetabolic activity to suggest skeletal metastasis.
IMPRESSION: 1. Mild decrease in metabolic activity and similar size of LEFT
upper lobe nodule. Recommend follow-up CT in 3 6 months.
2. Increase in size and metabolic activity of the LEFT lower lobe
nodule is concerning for neoplasm. RECOMMEND TISSUE SAMPLING OF THE
LEFT LOWER LOBE NODULE.
3. Bilateral hypermetabolic parotid lesions are most consistent with
primary parotid neoplasms (Warthin's tumors favored). Consider ENT
consultation.
These results will be called to the ordering clinician or
representative by the Radiologist Assistant, and communication
documented in the PACS or zVision Dashboard.

## 2019-03-18 DIAGNOSIS — C787 Secondary malignant neoplasm of liver and intrahepatic bile duct: Secondary | ICD-10-CM | POA: Diagnosis not present

## 2019-03-18 DIAGNOSIS — C3491 Malignant neoplasm of unspecified part of right bronchus or lung: Secondary | ICD-10-CM | POA: Diagnosis not present

## 2019-03-18 DIAGNOSIS — I2510 Atherosclerotic heart disease of native coronary artery without angina pectoris: Secondary | ICD-10-CM | POA: Diagnosis not present

## 2019-03-18 DIAGNOSIS — J449 Chronic obstructive pulmonary disease, unspecified: Secondary | ICD-10-CM | POA: Diagnosis not present

## 2019-03-20 DIAGNOSIS — J441 Chronic obstructive pulmonary disease with (acute) exacerbation: Secondary | ICD-10-CM | POA: Diagnosis not present

## 2019-03-30 DIAGNOSIS — J449 Chronic obstructive pulmonary disease, unspecified: Secondary | ICD-10-CM | POA: Diagnosis not present

## 2019-03-30 DIAGNOSIS — J9611 Chronic respiratory failure with hypoxia: Secondary | ICD-10-CM | POA: Diagnosis not present

## 2019-04-07 DIAGNOSIS — J449 Chronic obstructive pulmonary disease, unspecified: Secondary | ICD-10-CM | POA: Diagnosis not present

## 2019-04-19 ENCOUNTER — Other Ambulatory Visit: Payer: Self-pay

## 2019-04-19 ENCOUNTER — Ambulatory Visit: Payer: BC Managed Care – PPO | Attending: Internal Medicine

## 2019-04-19 DIAGNOSIS — Z20822 Contact with and (suspected) exposure to covid-19: Secondary | ICD-10-CM

## 2019-04-20 DIAGNOSIS — J441 Chronic obstructive pulmonary disease with (acute) exacerbation: Secondary | ICD-10-CM | POA: Diagnosis not present

## 2019-04-20 LAB — NOVEL CORONAVIRUS, NAA: SARS-CoV-2, NAA: NOT DETECTED

## 2019-04-30 DIAGNOSIS — J9611 Chronic respiratory failure with hypoxia: Secondary | ICD-10-CM | POA: Diagnosis not present

## 2019-04-30 DIAGNOSIS — J449 Chronic obstructive pulmonary disease, unspecified: Secondary | ICD-10-CM | POA: Diagnosis not present

## 2019-05-03 DIAGNOSIS — Z23 Encounter for immunization: Secondary | ICD-10-CM | POA: Diagnosis not present

## 2019-05-08 DIAGNOSIS — J449 Chronic obstructive pulmonary disease, unspecified: Secondary | ICD-10-CM | POA: Diagnosis not present

## 2019-05-12 ENCOUNTER — Telehealth: Payer: Self-pay | Admitting: *Deleted

## 2019-05-12 NOTE — Telephone Encounter (Signed)
Called patient to inform of CT for 05-24-19 - arrival time- 3:45 pm , no restrictons to test, test to be @ [facility] and fu to be on 05-25-19 @ 2:20 pm with Dr. Isidore Moos for results, spoke with patient and he is aware of these appts.

## 2019-05-18 ENCOUNTER — Other Ambulatory Visit: Payer: Self-pay | Admitting: Cardiology

## 2019-05-21 DIAGNOSIS — J441 Chronic obstructive pulmonary disease with (acute) exacerbation: Secondary | ICD-10-CM | POA: Diagnosis not present

## 2019-05-24 ENCOUNTER — Ambulatory Visit (HOSPITAL_COMMUNITY)
Admission: RE | Admit: 2019-05-24 | Discharge: 2019-05-24 | Disposition: A | Discharge status: Home / Self Care | Payer: BC Managed Care – PPO | Source: Ambulatory Visit | Attending: Radiation Oncology | Admitting: Radiation Oncology

## 2019-05-24 ENCOUNTER — Other Ambulatory Visit: Payer: Self-pay

## 2019-05-24 DIAGNOSIS — R911 Solitary pulmonary nodule: Secondary | ICD-10-CM | POA: Insufficient documentation

## 2019-05-24 DIAGNOSIS — C3492 Malignant neoplasm of unspecified part of left bronchus or lung: Secondary | ICD-10-CM | POA: Diagnosis not present

## 2019-05-24 NOTE — Progress Notes (Signed)
Radiation Oncology         [phone]) [phone] ________________________________  Name: Adam Bass MRN: [id number]  Date: 05/25/2019  DOB: 09-Mar-1951  Follow-Up Visit Note by MyChart video due to pandemic precautions  Outpatient  CC: Sinda Du, MD  Hildred Laser, MD  Diagnosis and Prior Radiotherapy:    ICD-10-CM   1. 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 He is here to discuss recent chest CT  The patient is on home oxygen currently while speaking with me.  "Wheezing more and a little more short-winded."  Receives 2nd vaccine (COVID) next week. He has been cooped up at home and less active due to the pandemic.  Smoking 1ppd.   I personally reviewed the results to his CT chest from yesterday.  I compared this to previous scans from November and July 2020.  Over the past several months, he has developed slowly growing subcentimeter nodules in the superior right lower lobe and left upper lobe.  These appear below the PET threshold.  The left upper lobe nodule and surrounding scarring and left lower lobe nodule and surrounding scarring are relatively stable.  The patient continues to be followed by Dr. Kathlene Cote and Dr. Laural Golden for his liver cancer.    ALLERGIES:  is allergic to bee venom.  Meds: Current Outpatient Medications  Medication Sig Dispense Refill  . albuterol (PROAIR HFA) 108 (90 Base) MCG/ACT inhaler Inhale 2  puffs into the lungs every 6 (six) hours as needed for wheezing.     Marland Kitchen albuterol (PROVENTIL) 4 MG tablet Take 4 mg by mouth 3 (three) times daily.    Marland Kitchen aspirin EC 81 MG tablet Take 81 mg by mouth daily. Take with brilanta    . atorvastatin (LIPITOR) 20 MG tablet Take 1 tablet (20 mg total) by mouth daily. 90 tablet 3  . bisoprolol (ZEBETA) 5 MG tablet Take 0.5 tablets (2.5 mg total) by mouth daily. 60 tablet 1  . Blood Pressure KIT 1 Units by Does not apply route daily. 1 each 0  . ezetimibe (ZETIA) 10 MG tablet Take 1 tablet (10 mg total) by mouth daily. 90 tablet 1  . famotidine (PEPCID) 20 MG tablet Take 20 mg by mouth daily as needed for heartburn or indigestion.    . fluticasone (FLONASE) 50 MCG/ACT nasal spray Place 1 spray into both nostrils daily as needed for allergies.     . Multiple Vitamins-Minerals (MULTIVITAMIN WITH MINERALS) tablet Take 1 tablet by mouth daily.    . nitroGLYCERIN (NITROSTAT) 0.4 MG SL tablet PLACE 1 TABLET UNDER THE TONGUE EVERY 5 MINUTES AS NEEDED. FOLLOW MD DIRECTIONS FOR WHEN TO CALL 911 25 tablet 4  . OXYGEN Inhale into the lungs as needed. 4 Liters supplemental o2 , as needed "when im up moving around"    . pantoprazole (PROTONIX) 40 MG tablet Take 40 mg by mouth daily.  5  . Potassium 99 MG TABS Take 1 tablet by mouth daily.     . Tetrahydrozoline HCl (VISINE OP) Place 1 drop into both eyes daily as needed (Dry Eyes).    . TRELEGY ELLIPTA 100-62.5-25 MCG/INH AEPB INHALE 1 PUFF INTO THE LUNGS EVERY DAY 60 each 0  . vitamin E 400 UNIT capsule Take 1 tablet by mouth daily.      No current facility-administered medications for this encounter.    Physical Findings: The patient is in no acute distress. Patient is alert and oriented.  vitals were not taken for this visit. .   Oxygen per nasal cannula  Lab Findings: Lab Results  Component Value Date   WBC 9.6 12/15/2018   HGB 14.9 12/15/2018   HCT 44.5 12/15/2018   MCV 104.7 (H) 12/15/2018   PLT 305  12/15/2018    Radiographic Findings: CT Chest Wo Contrast  Result Date: 05/25/2019 CLINICAL DATA:  Surveillance of left lung non-small cell cancer, status post SBRT. EXAM: CT CHEST WITHOUT CONTRAST TECHNIQUE: Multidetector CT imaging of the chest was performed following the standard protocol without IV contrast. COMPARISON:  03/01/2019 FINDINGS: Cardiovascular: Coronary, aortic arch, and branch vessel atherosclerotic vascular disease. Mediastinum/Nodes: Unremarkable Lungs/Pleura: Stable biapical pleuroparenchymal scarring. Bandlike density extending posterior to a left upper lobe nodule which measures up to 1.2 cm in transverse diameter, previously the same by my measurements. The posterior margin of this lesion is obscured by the adjacent scarring and accordingly a long axis measurement is difficult to arrive at, but overall on a subjective basis the appearance similar. Mild airway thickening. Severe emphysema. Superior segment right lower lobe nodule 0.6 by 0.5 cm on image 71/4, stable. Left upper lobe nodule on image 97/4 measures 0.7 by 0.8 cm, previously 0.7 by 0.9 cm by my measurements. Considerable airway plugging in the left lower lobe bronchial tree. Sub solid architectural distortion with central solid nodularity in the left lower lobe, nodular portion has indistinct lateral margin obscuring size, but measures 0.7 cm in short axis (previously 0.6 cm by my measurements). The surrounding region of architectural distortion measures up to about 3.5 cm transverse. Upper Abdomen: Left kidney exophytic upper pole lesion partially included on today's exam. Ablation site in the left lower lobe appears similar with hypodensity capsular retraction. Morphologic indicators of hepatic cirrhosis. Musculoskeletal: Remote healed lower distal rib fractures. IMPRESSION: 1. The left upper lobe nodule is stable in size. The left lower lobe mixed density nodule has indistinct lateral margin and has a central solid  component minimally increased in size. 2. Stable superior segment right lower lobe nodule. Roughly stable additional left upper lobe nodule. 3. Airway plugging in portions of the left lower lobe. 4. Other imaging findings of potential clinical significance: Coronary, aortic arch, and branch vessel atherosclerotic vascular disease. Severe emphysema. Airway thickening is present, suggesting bronchitis or reactive airways disease. Ablation site in the left lower lobe. Hepatic cirrhosis. Aortic Atherosclerosis (ICD10-I70.0) and Emphysema (ICD10-J43.9). Electronically Signed   By: Van Clines M.D.   On: 05/25/2019 08:23    Impression/Plan:  This is a very nice gentleman with a history of left lung cancer, Lower lobe; responded well to SBRT.   I asked the patient today about tobacco use. The patient uses tobacco.  I advised the patient to quit. Services were offered by me today including outpatient counseling and pharmacotherapy. I assessed for the willingness to attempt to quit and provided encouragement and demonstrated willingness to make referrals and/or prescriptions to help the  patient attempt to quit. The patient has follow-up with the oncologic team to touch base on their tobacco use and /or cessation efforts.  Over 3 minutes were spent on this issue.  He does not feel ready to quit right now.  We will continue to follow his lungs closely.  He understands that the untreated bilateral subcentimeter lung nodules may warrant biopsy or SBRT in the future.  Right now they are not growing aggressively and they are below PET threshold.  We will arrange follow-up with CT scan of chest in May.  He knows to call if he has any issues in the interim.  This encounter was provided by telemedicine platform MyChart video The patient has given verbal consent for this type of encounter and has been advised to only accept a meeting of this type in a secure network environment. The time spent during this encounter  on date of service, in total, was over 40 minutes. The attendants for this meeting include Eppie Gibson and  [name].  During the encounter, Lamont Dowdy located at [facility] Radiation Oncology Department.  [name] was located at home.   ___________________________________   Eppie Gibson, MD  This document serves as a record of services personally performed by Eppie Gibson, MD. It was created on her behalf by Wilburn Mylar, a trained medical scribe. The creation of this record is based on the scribe's personal observations and the provider's statements to them. This document has been checked and approved by the attending provider.

## 2019-05-25 ENCOUNTER — Encounter: Payer: Self-pay | Admitting: Radiation Oncology

## 2019-05-25 ENCOUNTER — Ambulatory Visit
Admission: RE | Admit: 2019-05-25 | Discharge: 2019-05-25 | Disposition: A | Discharge status: Home / Self Care | Payer: BC Managed Care – PPO | Source: Ambulatory Visit | Attending: Radiation Oncology | Admitting: Radiation Oncology

## 2019-05-25 DIAGNOSIS — Z85118 Personal history of other malignant neoplasm of bronchus and lung: Secondary | ICD-10-CM | POA: Diagnosis not present

## 2019-05-25 DIAGNOSIS — C3432 Malignant neoplasm of lower lobe, left bronchus or lung: Secondary | ICD-10-CM

## 2019-05-25 DIAGNOSIS — F17210 Nicotine dependence, cigarettes, uncomplicated: Secondary | ICD-10-CM | POA: Diagnosis not present

## 2019-05-25 DIAGNOSIS — Z08 Encounter for follow-up examination after completed treatment for malignant neoplasm: Secondary | ICD-10-CM | POA: Diagnosis not present

## 2019-05-25 DIAGNOSIS — R918 Other nonspecific abnormal finding of lung field: Secondary | ICD-10-CM | POA: Diagnosis not present

## 2019-05-27 ENCOUNTER — Other Ambulatory Visit: Payer: Self-pay

## 2019-05-31 DIAGNOSIS — J9611 Chronic respiratory failure with hypoxia: Secondary | ICD-10-CM | POA: Diagnosis not present

## 2019-05-31 DIAGNOSIS — Z23 Encounter for immunization: Secondary | ICD-10-CM | POA: Diagnosis not present

## 2019-05-31 DIAGNOSIS — J449 Chronic obstructive pulmonary disease, unspecified: Secondary | ICD-10-CM | POA: Diagnosis not present

## 2019-06-01 ENCOUNTER — Other Ambulatory Visit: Payer: Self-pay | Admitting: Cardiology

## 2019-06-15 ENCOUNTER — Ambulatory Visit (INDEPENDENT_AMBULATORY_CARE_PROVIDER_SITE_OTHER): Payer: BC Managed Care – PPO | Admitting: Internal Medicine

## 2019-06-15 ENCOUNTER — Other Ambulatory Visit: Payer: Self-pay

## 2019-06-15 ENCOUNTER — Encounter (INDEPENDENT_AMBULATORY_CARE_PROVIDER_SITE_OTHER): Payer: Self-pay | Admitting: Internal Medicine

## 2019-06-15 VITALS — BP 102/61 | HR 60 | Temp 97.4°F | Ht 69.0 in | Wt 147.0 lb

## 2019-06-15 DIAGNOSIS — B182 Chronic viral hepatitis C: Secondary | ICD-10-CM | POA: Diagnosis not present

## 2019-06-15 DIAGNOSIS — K7460 Unspecified cirrhosis of liver: Secondary | ICD-10-CM

## 2019-06-15 DIAGNOSIS — C220 Liver cell carcinoma: Secondary | ICD-10-CM | POA: Diagnosis not present

## 2019-06-15 DIAGNOSIS — K219 Gastro-esophageal reflux disease without esophagitis: Secondary | ICD-10-CM

## 2019-06-15 MED ORDER — SIMETHICONE 180 MG PO CAPS: 189.0000 mg | ORAL_CAPSULE | Freq: Two times a day (BID) | ORAL | 0 refills | Status: DC | PRN

## 2019-06-15 NOTE — Patient Instructions (Addendum)
Remember to take pantoprazole 30 minutes before breakfast daily. Take famotidine/Pepcid OTC in the evening or at night on as-needed basis. Physician will call with results of MRI and blood work. Plan colonoscopy within the next 3 months.

## 2019-06-15 NOTE — Progress Notes (Signed)
Presenting complaint;  Follow-up for GERD, cirrhosis and history of hepatocellular carcinoma.  Database and subjective:  Patient is 69 year old Caucasian male who is here for scheduled visit. Patient was seen in fall 2019 for elevated transaminases and CT evidence of cirrhosis.  On work-up he was found to have hepatitis C genotype 1a.  Ultrasound in October 2019 revealed 2 solid lesions concerning for malignancy.  Therefore MRI was obtained revealing 2.6 cm lesion within the lateral segment of left lobe concerning for hepatocellular carcinoma.  Additionally there were 3 other lesions which were LI-RADS and indeterminate for [place].  His alpha-fetoprotein was elevated.  He underwent microwave thermal ablation of this lesion by Dr. Everlene Farrier on 06/10/2018 and he tolerated the procedure well.  His alpha-fetoprotein dropped significantly. Follow-up MR in June and October 2020 did not reveal any concerning lesion. Patient also has history of lung carcinoma and remains in remission.  He states he is feeling well.  He has good appetite.  He does complain of intermittent postprandial bloating which seem to affect his breathing.  He is using nasal O2.  He feels heartburn is well controlled and he may have an occasional episode with certain foods.  He denies nausea vomiting or dysphagia.  His bowels move 2-3 times a day.  Most of his stools are formed.  He denies melena or rectal bleeding.  He has history of colonic adenomas and is due for surveillance colonoscopy this year. He has gained 5 pounds since his last visit. He has quit cigarette smoking after 50 years and now smoking cigar about a pack a day.  He continues drinking 1 can of beer a day.   Current Medications: Outpatient Encounter Medications as of 06/15/2019  Medication Sig  . albuterol (PROAIR HFA) 108 (90 Base) MCG/ACT inhaler Inhale 2 puffs into the lungs every 6 (six) hours as needed for wheezing.   Marland Kitchen albuterol (PROVENTIL) 4 MG tablet Take 4  mg by mouth 3 (three) times daily.  Marland Kitchen aspirin EC 81 MG tablet Take 81 mg by mouth daily. Take with brilanta  . atorvastatin (LIPITOR) 20 MG tablet Take 1 tablet (20 mg total) by mouth daily.  . bisoprolol (ZEBETA) 5 MG tablet Take 0.5 tablets (2.5 mg total) by mouth daily.  . Blood Pressure KIT 1 Units by Does not apply route daily.  Marland Kitchen ezetimibe (ZETIA) 10 MG tablet Take 1 tablet (10 mg total) by mouth daily.  . famotidine (PEPCID) 20 MG tablet Take 20 mg by mouth daily as needed for heartburn or indigestion.  . fluticasone (FLONASE) 50 MCG/ACT nasal spray Place 1 spray into both nostrils daily as needed for allergies.   Marland Kitchen lisinopril (ZESTRIL) 2.5 MG tablet Take 2.5 mg by mouth daily.  . Multiple Vitamins-Minerals (MULTIVITAMIN WITH MINERALS) tablet Take 1 tablet by mouth daily.  . nitroGLYCERIN (NITROSTAT) 0.4 MG SL tablet PLACE 1 TABLET UNDER THE TONGUE EVERY 5 MINUTES AS NEEDED. FOLLOW MD DIRECTIONS FOR WHEN TO CALL 911  . OXYGEN Inhale into the lungs as needed. 4 Liters supplemental o2 , as needed "when im up moving around"  . pantoprazole (PROTONIX) 40 MG tablet Take 40 mg by mouth daily.   . Potassium 99 MG TABS Take 1 tablet by mouth daily.   . Tetrahydrozoline HCl (VISINE OP) Place 1 drop into both eyes daily as needed (Dry Eyes).  . TRELEGY ELLIPTA 100-62.5-25 MCG/INH AEPB INHALE 1 PUFF INTO THE LUNGS EVERY DAY  . vitamin E 400 UNIT capsule Take 1 tablet by  mouth daily.    No facility-administered encounter medications on file as of 06/15/2019.    Objective: Blood pressure 102/61, pulse 60, temperature (!) 97.4 F (36.3 C), temperature source Temporal, height 5' 9"  (1.753 m), weight 147 lb (66.7 kg). Patient is alert and in no acute distress. He is wearing a facial mask. He is on nasal O2. Conjunctiva is pink. Sclera is nonicteric Oropharyngeal mucosa is normal. No neck masses or thyromegaly noted. Cardiac exam with regular rhythm normal S1 and S2. No murmur or gallop  noted. Lungs are clear to auscultation. Abdomen is symmetrical soft and nontender without organomegaly or masses. No LE edema or clubbing noted.  Labs/studies Results:  CBC Latest Ref Rng & Units 12/15/2018 06/22/2018 06/11/2018  WBC 3.8 - 10.8 Thousand/uL 9.6 10.7 12.0(H)  Hemoglobin 13.2 - 17.1 g/dL 14.9 13.6 11.3(L)  Hematocrit 38.5 - 50.0 % 44.5 38.8 35.3(L)  Platelets 140 - 400 Thousand/uL 305 357 246    CMP Latest Ref Rng & Units 01/21/2019 12/15/2018 10/02/2018  Glucose 70 - 99 mg/dL - - -  BUN 8 - 23 mg/dL - - -  Creatinine 0.61 - 1.24 mg/dL 0.80 - 0.70  Sodium 135 - 145 mmol/L - - -  Potassium 3.5 - 5.1 mmol/L - - -  Chloride 98 - 111 mmol/L - - -  CO2 22 - 32 mmol/L - - -  Calcium 8.9 - 10.3 mg/dL - - -  Total Protein 6.1 - 8.1 g/dL - 7.2 -  Total Bilirubin 0.2 - 1.2 mg/dL - 0.7 -  Alkaline Phos 38 - 126 U/L - - -  AST 10 - 35 U/L - 30 -  ALT 9 - 46 U/L - 26 -    Hepatic Function Latest Ref Rng & Units 12/15/2018 06/22/2018 06/11/2018  Total Protein 6.1 - 8.1 g/dL 7.2 7.2 5.9(L)  Albumin 3.5 - 5.0 g/dL - - 3.1(L)  AST 10 - 35 U/L 30 34 144(H)  ALT 9 - 46 U/L 26 39 121(H)  Alk Phosphatase 38 - 126 U/L - - 71  Total Bilirubin 0.2 - 1.2 mg/dL 0.7 0.7 0.8  Bilirubin, Direct 0.0 - 0.2 mg/dL 0.2 0.1 -    Lab data from 12/15/2018  HCVRNA by PCR was negative 020. Alpha-fetoprotein was 17.2.  AFP was 161.4 on 06/22/2018  AFP was 163.9 on 02/02/2018   Assessment:  #1.  Cirrhosis secondary to hepatitis C for which she was treated with Mavyret (Glecaprevir 167m and Pibrentasvir 40 mg) with SVR.  He completed therapy 1 year ago and HCVRNA was -6 months ago.  No indication to repeat HCVRNA unless he is noted to have elevated transaminases.  #2.  Hepatocellular carcinoma in the background of cirrhosis secondary to chronic hepatitis C. he underwent microwave thermal ablation in February last year.  MR in October 2020 revealed small scattered foci of arterial phase hyperenhancement which  have remained stable since prior study.  He needs a follow-up study to document stability of these lesions.  Last alpha-fetoprotein was still mildly elevated and will be repeated.  #3.  Chronic GERD.  He is doing well with therapy.  We will continue pantoprazole 40 mg daily and as needed famotidine breakthrough symptoms.  #4.  History of colonic adenomas.  Last exam was in May 2014 with removal of 4 polyps.  One was lost and the 3 others were tubular adenomas.  He has overdue for surveillance colonoscopy.   Plan:  MR abdomen with and without contrast. Patient will go  to the lab for CBC, LFTs and alpha-fetoprotein. Surveillance colonoscopy will be scheduled within the next few months under monitored anesthesia care. Patient counseled regarding continuous smoking.  He is not smoking cigars.  He must quit.  He may want to talk with PCP to review treatment options that might be available to help him. Office visit in 6 months.

## 2019-06-18 DIAGNOSIS — J441 Chronic obstructive pulmonary disease with (acute) exacerbation: Secondary | ICD-10-CM | POA: Diagnosis not present

## 2019-06-22 ENCOUNTER — Ambulatory Visit
Admission: EM | Admit: 2019-06-22 | Discharge: 2019-06-22 | Disposition: A | Discharge status: Home / Self Care | Payer: BC Managed Care – PPO | Attending: Emergency Medicine | Admitting: Emergency Medicine

## 2019-06-22 ENCOUNTER — Ambulatory Visit (INDEPENDENT_AMBULATORY_CARE_PROVIDER_SITE_OTHER): Payer: BC Managed Care – PPO

## 2019-06-22 ENCOUNTER — Other Ambulatory Visit: Payer: Self-pay

## 2019-06-22 ENCOUNTER — Ambulatory Visit (INDEPENDENT_AMBULATORY_CARE_PROVIDER_SITE_OTHER)
Admission: RE | Admit: 2019-06-22 | Discharge: 2019-06-22 | Disposition: A | Discharge status: Other Acute Inpatient Hospital | Payer: BC Managed Care – PPO | Source: Ambulatory Visit

## 2019-06-22 DIAGNOSIS — F17210 Nicotine dependence, cigarettes, uncomplicated: Secondary | ICD-10-CM | POA: Diagnosis not present

## 2019-06-22 DIAGNOSIS — R0981 Nasal congestion: Secondary | ICD-10-CM | POA: Diagnosis not present

## 2019-06-22 DIAGNOSIS — J441 Chronic obstructive pulmonary disease with (acute) exacerbation: Secondary | ICD-10-CM | POA: Diagnosis not present

## 2019-06-22 DIAGNOSIS — R058 Other specified cough: Secondary | ICD-10-CM

## 2019-06-22 DIAGNOSIS — R05 Cough: Secondary | ICD-10-CM

## 2019-06-22 DIAGNOSIS — J449 Chronic obstructive pulmonary disease, unspecified: Secondary | ICD-10-CM

## 2019-06-22 DIAGNOSIS — R0602 Shortness of breath: Secondary | ICD-10-CM

## 2019-06-22 DIAGNOSIS — J439 Emphysema, unspecified: Secondary | ICD-10-CM

## 2019-06-22 MED ORDER — PREDNISONE 10 MG (21) PO TBPK: ORAL_TABLET | ORAL | 0 refills | Status: DC

## 2019-06-22 MED ORDER — FLUTICASONE PROPIONATE 50 MCG/ACT NA SUSP: 1.0000 | Freq: Every day | NASAL | 0 refills | Status: DC

## 2019-06-22 MED ORDER — AZITHROMYCIN 250 MG PO TABS: 250.0000 mg | ORAL_TABLET | Freq: Every day | ORAL | 0 refills | Status: DC

## 2019-06-22 MED ORDER — BENZONATATE 100 MG PO CAPS: 100.0000 mg | ORAL_CAPSULE | Freq: Three times a day (TID) | ORAL | 0 refills | Status: DC

## 2019-06-22 NOTE — ED Provider Notes (Signed)
[facility]    CSN: [id number] Arrival date & time: 06/22/19  1732      History   Chief Complaint Chief Complaint  Patient presents with  . Cough    HPI Adam Bass is a 69 y.o. male.   With history of COPD  presented to the urgent care with a  complaint of stuffy nose, productive cough with yellowish-green mucus, shortness of breath for the past 4 days.  Denies any precipitating event.  Denies exposure to Covid, flu or strep.  Was previously seen today via telehealth and was advised to be seen.  He is currently on 4 L of oxygen at home.  Report he has been using his albuterol inhaler more often.  Denies chills,  fever, nausea, vomiting, diarrhea, chest pain, chest tightness.  The history is provided by the patient. No language interpreter was used.  Cough Associated symptoms: shortness of breath     Past Medical History:  Diagnosis Date  . Anticoagulant long-term use    brillinta  . Bilateral lower extremity edema    feet, noticed since discharge from hospital admission 05-18-2018, "i was laid up in the bed for five days"   . CAD (coronary artery disease) cardiologist-- dr j. branch   hx STEMI 11-23-2017,  s/p  cardiac cath with Thrombectomy, PCI and DES x1 to midRCA, and other nonobstrucitve CAD involving pLAD, OM1, dRCA, posterior atrio, LVEF 45-50%,  LVEDP 8mHg  . COPD (chronic obstructive pulmonary disease) with emphysema ([facility]    pulmonology-- dr wert-- GOLD III mix (pt still smokes),  last exacerbation -due to the flu , [facility]  . DOE (dyspnea on exertion)   . Ectatic abdominal aorta ([place]    MRI 11/ 2019  . Hepatitis C GI-- dr rLaural Golden  dx 02-02-2018--- currently treated on antiviral medication  . Hepatocellular carcinoma ([place]   . History of radiation therapy 06/03/17- 06/09/17   Left Lung treated to 54 Gy with 3 fx of 18 Gy. SBRT  . History of skin cancer    04/ 2017 excision from face  . History of ST elevation  myocardial infarction (STEMI) 11/23/2017   inferior wall-- s/p cardiac cath w/ thrombectomy, PCI, and DES  . HLD (hyperlipidemia)   . Hypertension   . Primary squamous cell carcinoma of left lung ([facility] oncologist-- dr sIsidore Moos  dx 02/ 2018-- Stage IA2 Left lower lung,  s/p  Stereotactic radiation completed 06-09-2017  . Productive cough    per pt mostly clear  . Requires supplemental oxygen    06-09-2018 currently pt is using a portable O2 as needed and at night  . S/P drug eluting coronary stent placement 11/23/2017   DES x1 to midRCA  . Urinary incontinence    wear depends  . Wears dentures    fuller upper,  lower partial  . Wears glasses     Patient Active Problem List   Diagnosis Date Noted  . GERD (gastroesophageal reflux disease) 06/15/2019  . Hepatic cirrhosis due to chronic hepatitis C infection ([place] 12/15/2018  . Hepatocellular carcinoma ([place] 06/10/2018  . Influenza due to identified novel influenza A virus with other respiratory manifestations 05/17/2018  . Coronary artery disease, occlusive 05/16/2018  . COPD exacerbation ([place] 05/13/2018  . Hepatitis C 05/13/2018  . Smoking 11/27/2017  . ETOH abuse 11/27/2017  . Heart attack ([place]   . Hyperlipidemia with target LDL less than 70   . LFT elevation   . Acute  ST elevation myocardial infarction (STEMI) of inferior wall ([place]) 11/23/2017  . 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 Primary cancer of left lower lobe of lung ([place]) 04/30/2017  . COPD GOLD III/ still smoking cigars  07/21/2012    Past Surgical History:  Procedure Laterality Date  . COLONOSCOPY N/A 08/27/2012   Procedure: COLONOSCOPY;  Surgeon: Rogene [place], MD;  [facility];  Service: Endoscopy;  Laterality: N/A;  1200  . CORONARY STENT INTERVENTION N/A 11/23/2017   Procedure: CORONARY STENT INTERVENTION;  Surgeon: Jettie Booze, MD;  Location: [place] CV LAB;  Service:  Cardiovascular;  Laterality: N/A;  . IR RADIOLOGIST EVAL & MGMT  03/04/2018  . IR RADIOLOGIST EVAL & MGMT  10/06/2018  . IR RADIOLOGIST EVAL & MGMT  01/26/2019  . LEFT HEART CATH AND CORONARY ANGIOGRAPHY N/A 11/23/2017   Procedure: LEFT HEART CATH AND CORONARY ANGIOGRAPHY;  Surgeon: Jettie Booze, MD;  Location: [place] CV LAB;  Service: Cardiovascular;  Laterality: N/A;  . RADIOFREQUENCY ABLATION N/A 06/10/2018   Procedure: MICROWAVE THERMAL ABLATION LIVER;  Surgeon: Aletta Edouard, MD;  [facility];  Service: Anesthesiology;  Laterality: N/A;       Home Medications    Prior to Admission medications   Medication Sig Start Date End Date Taking? Authorizing Provider  albuterol (PROAIR HFA) 108 (90 Base) MCG/ACT inhaler Inhale 2 puffs into the lungs every 6 (six) hours as needed for wheezing.     [provider]  albuterol (PROVENTIL) 4 MG tablet Take 4 mg by mouth 3 (three) times daily.    [provider]  aspirin EC 81 MG tablet Take 81 mg by mouth daily. Take with brilanta    [provider]  atorvastatin (LIPITOR) 20 MG tablet Take 1 tablet (20 mg total) by mouth daily. 02/26/19 06/15/19  Arnoldo Lenis, MD  azithromycin (ZITHROMAX) 250 MG tablet Take 1 tablet (250 mg total) by mouth daily. Take first 2 tablets together, then 1 every day until finished. 06/22/19   Dossie Ocanas, Darrelyn Hillock, FNP  benzonatate (TESSALON) 100 MG capsule Take 1 capsule (100 mg total) by mouth every 8 (eight) hours. 06/22/19   Mykal Kirchman, Darrelyn Hillock, FNP  bisoprolol (ZEBETA) 5 MG tablet Take 0.5 tablets (2.5 mg total) by mouth daily. 11/28/17   [name] B, NP  Blood Pressure KIT 1 Units by Does not apply route daily. 11/27/17   Cheryln Manly, NP  ezetimibe (ZETIA) 10 MG tablet Take 1 tablet (10 mg total) by mouth daily. 11/27/17 06/15/19  Cheryln Manly, NP  famotidine (PEPCID) 20 MG tablet Take 20 mg by mouth daily as needed for heartburn or indigestion.    [provider]  fluticasone (FLONASE) 50 MCG/ACT nasal spray Place 1 spray into both nostrils daily for 14 days. 06/22/19 07/06/19  Johsua Shevlin, Darrelyn Hillock, FNP  lisinopril (ZESTRIL) 2.5 MG tablet Take 2.5 mg by mouth daily. 03/06/19   [provider]  Multiple Vitamins-Minerals (MULTIVITAMIN WITH MINERALS) tablet Take 1 tablet by mouth daily.    [provider]  nitroGLYCERIN (NITROSTAT) 0.4 MG SL tablet PLACE 1 TABLET UNDER THE TONGUE EVERY 5 MINUTES AS NEEDED. FOLLOW MD DIRECTIONS FOR WHEN TO CALL 911 05/18/19   [name], NP  OXYGEN Inhale into the lungs as needed. 4 Liters supplemental o2 , as needed "when im up moving around"    [provider]  pantoprazole (PROTONIX) 40  MG tablet Take 40 mg by mouth daily.  02/02/18   [provider]  Potassium 99 MG TABS Take 1 tablet by mouth daily.     [provider]  predniSONE (STERAPRED UNI-PAK 21 TAB) 10 MG (21) TBPK tablet Take 6 tabs by mouth daily  for 2 days, then 5 tabs for 2 days, then 4 tabs for 2 days, then 3 tabs for 2 days, 2 tabs for 2 days, then 1 tab by mouth daily for 2 days 06/22/19   Emerson Monte, FNP  Simethicone 180 MG CAPS Take 1.05 capsules (189 mg total) by mouth 2 (two) times daily as needed. 06/15/19   Rehman, Mechele Dawley, MD  Tetrahydrozoline HCl (VISINE OP) Place 1 drop into both eyes daily as needed (Dry Eyes).    [provider]  Donnal Debar 100-62.5-25 MCG/INH AEPB INHALE 1 PUFF INTO THE LUNGS EVERY DAY 12/16/18   Tanda Rockers, MD  vitamin E 400 UNIT capsule Take 1 tablet by mouth daily.     [provider]    Family History Family History  Problem Relation Age of Onset  . Breast cancer Sister   . Colon cancer Neg Hx     Social History Social History   Tobacco Use  . Smoking status: Current Every Day Smoker    Packs/day: 0.75    Years: 50.00    Pack years: 37.50    Types: Cigars  . Smokeless tobacco: Never Used  . Tobacco comment: . He is smoking  about 5-10 cigarettes daily.   Substance Use Topics  . Alcohol use: Yes    Alcohol/week: 8.0 standard drinks    Types: 8 Cans of beer per week    Comment: 8 beers weekly, give or take   . Drug use: No     Allergies   Bee venom   Review of Systems Review of Systems  Constitutional: Negative.   HENT: Positive for congestion.   Respiratory: Positive for cough and shortness of breath.   Cardiovascular: Negative.   All other systems reviewed and are negative.    Physical Exam Triage Vital Signs ED Triage Vitals  Enc Vitals Group     BP 06/22/19 1748 113/66     Pulse Rate 06/22/19 1748 83     Resp 06/22/19 1748 (!) 24     Temp 06/22/19 1748 98.1 F (36.7 C)     Temp src --      SpO2 06/22/19 1748 92 %     Weight --      Height --      Head Circumference --      Peak Flow --      Pain Score 06/22/19 1745 0     Pain Loc --      Pain Edu? --      Excl. in [place]? --    No data found.  Updated Vital Signs BP 113/66   Pulse 83   Temp 98.1 F (36.7 C)   Resp (!) 24   SpO2 92%   Visual Acuity Right Eye Distance:   Left Eye Distance:   Bilateral Distance:    Right Eye Near:   Left Eye Near:    Bilateral Near:     Physical Exam Vitals and nursing note reviewed.  Constitutional:      General: He is not in acute distress.    Appearance: Normal appearance. He is normal weight. He is not ill-appearing, toxic-appearing or diaphoretic.  HENT:  Head: Normocephalic.     Nose: Congestion present.  Cardiovascular:     Rate and Rhythm: Normal rate and regular rhythm.     Pulses: Normal pulses.     Heart sounds: Normal heart sounds.  Pulmonary:     Effort: Pulmonary effort is normal. No respiratory distress.     Breath sounds: Normal breath sounds. No stridor. No wheezing, rhonchi or rales.  Chest:     Chest wall: No tenderness.  Neurological:     Mental Status: He is alert and oriented to person, place, and time.      UC Treatments / Results  Labs (all  labs ordered are listed, but only abnormal results are displayed) Labs Reviewed - No data to display  EKG   Radiology DG Chest 2 View  Result Date: 06/22/2019 CLINICAL DATA:  Cough and shortness of breath for 3 days. Emphysema. Smoker. EXAM: CHEST - 2 VIEW COMPARISON:  06/09/2018 and 11/23/2017 FINDINGS: The heart size and mediastinal contours are within normal limits. Aortic atherosclerosis incidentally noted. Marked pulmonary hyperinflation again seen, consistent with COPD. Nodular density in left lung apex remains stable. No evidence of pulmonary infiltrate or edema. No evidence of pleural effusion. The visualized skeletal structures are unremarkable. IMPRESSION: Stable exam.  Emphysema.  No active disease. Electronically Signed   By: Marlaine Hind M.D.   On: 06/22/2019 18:11    Procedures Procedures (including critical care time)  Medications Ordered in UC Medications - No data to display  Initial Impression / Assessment and Plan / UC Course  I have reviewed the triage vital signs and the nursing notes.  Pertinent labs & imaging results that were available during my care of the patient were reviewed by me and considered in my medical decision making (see chart for details).    X-ray is negative for active pulmonary disease.  I have reviewed the x-ray myself and the radiologist interpretation.  I am in agreement with the radiologist interpretation.  Patient is stable at discharge. Tessalon Perles prescribed for cough Prednisone taper will be prescribed Flonase will be prescribed for congestion Azithromycin was prescribed Advised patient to follow-up with PCP as soon as possible Go to ED for worsening of symptoms  Final Clinical Impressions(s) / UC Diagnoses   Final diagnoses:  COPD exacerbation ([place])     Discharge Instructions     Prednisone taper was prescribed Tessalon Perles prescribed for cough Flonase for congestion Take azithromycin as prescribed until  completion Follow-up with primary care Go to ED for worsening of symptoms     ED Prescriptions    Medication Sig Dispense Auth. Provider   azithromycin (ZITHROMAX) 250 MG tablet Take 1 tablet (250 mg total) by mouth daily. Take first 2 tablets together, then 1 every day until finished. 6 tablet Daja Shuping S, FNP   fluticasone (FLONASE) 50 MCG/ACT nasal spray Place 1 spray into both nostrils daily for 14 days. 16 g Ellsie Violette S, FNP   benzonatate (TESSALON) 100 MG capsule Take 1 capsule (100 mg total) by mouth every 8 (eight) hours. 30 capsule Anders Hohmann S, FNP   predniSONE (STERAPRED UNI-PAK 21 TAB) 10 MG (21) TBPK tablet Take 6 tabs by mouth daily  for 2 days, then 5 tabs for 2 days, then 4 tabs for 2 days, then 3 tabs for 2 days, 2 tabs for 2 days, then 1 tab by mouth daily for 2 days 42 tablet Loy Little, Darrelyn Hillock, FNP     PDMP not reviewed this encounter.  Emerson Monte, FNP 06/22/19 [phone]

## 2019-06-22 NOTE — Discharge Instructions (Addendum)
Prednisone taper was prescribed Tessalon Perles prescribed for cough Flonase for congestion Take azithromycin as prescribed until completion Follow-up with primary care Go to ED for worsening of symptoms

## 2019-06-22 NOTE — ED Triage Notes (Signed)
Pt is on 2 L continuous O2 has had both covid vaccines

## 2019-06-22 NOTE — ED Provider Notes (Signed)
Virtual Visit via Video Note:  Adam Bass  initiated request for Telemedicine visit with [facility] team. I connected with Adam Bass  on 06/22/2019 at 4:45 PM  for a synchronized telemedicine visit using a video enabled HIPPA compliant telemedicine application. I verified that I am speaking with Adam Bass  using two identifiers. Adam Eagles, PA-C  was physically located in a [facility]gent care site and Adam Bass was located at a different location.   The limitations of evaluation and management by telemedicine as well as the availability of in-person appointments were discussed. Patient was informed that he  may incur a bill ( including co-pay) for this virtual visit encounter. Adam Bass  expressed understanding and gave verbal consent to proceed with virtual visit.     History of Present Illness:Adam Bass  is a 69 y.o. male presents with 4 day hx of stuffy nose, acute on chronic productive cough. Has had some mild worsening of his shortness of breath, chest congestion.  Has COPD, has 4L of oxygen at home.  Feels like he needs to be using his albuterol inhaler more frequently now.  Patient is requesting antibiotic. Denies fever, chest pain.  ROS  Current Outpatient Medications  Medication Sig Dispense Refill  . albuterol (PROAIR HFA) 108 (90 Base) MCG/ACT inhaler Inhale 2 puffs into the lungs every 6 (six) hours as needed for wheezing.     Marland Kitchen albuterol (PROVENTIL) 4 MG tablet Take 4 mg by mouth 3 (three) times daily.    Marland Kitchen aspirin EC 81 MG tablet Take 81 mg by mouth daily. Take with brilanta    . atorvastatin (LIPITOR) 20 MG tablet Take 1 tablet (20 mg total) by mouth daily. 90 tablet 3  . bisoprolol (ZEBETA) 5 MG tablet Take 0.5 tablets (2.5 mg total) by mouth daily. 60 tablet 1  . Blood Pressure KIT 1 Units by Does not apply route daily. 1 each 0  . ezetimibe (ZETIA) 10 MG tablet Take 1 tablet (10 mg total) by mouth daily. 90 tablet 1  .  famotidine (PEPCID) 20 MG tablet Take 20 mg by mouth daily as needed for heartburn or indigestion.    . fluticasone (FLONASE) 50 MCG/ACT nasal spray Place 1 spray into both nostrils daily as needed for allergies.     Marland Kitchen lisinopril (ZESTRIL) 2.5 MG tablet Take 2.5 mg by mouth daily.    . Multiple Vitamins-Minerals (MULTIVITAMIN WITH MINERALS) tablet Take 1 tablet by mouth daily.    . nitroGLYCERIN (NITROSTAT) 0.4 MG SL tablet PLACE 1 TABLET UNDER THE TONGUE EVERY 5 MINUTES AS NEEDED. FOLLOW MD DIRECTIONS FOR WHEN TO CALL 911 25 tablet 4  . OXYGEN Inhale into the lungs as needed. 4 Liters supplemental o2 , as needed "when im up moving around"    . pantoprazole (PROTONIX) 40 MG tablet Take 40 mg by mouth daily.   5  . Potassium 99 MG TABS Take 1 tablet by mouth daily.     . Simethicone 180 MG CAPS Take 1.05 capsules (189 mg total) by mouth 2 (two) times daily as needed.  0  . Tetrahydrozoline HCl (VISINE OP) Place 1 drop into both eyes daily as needed (Dry Eyes).    . TRELEGY ELLIPTA 100-62.5-25 MCG/INH AEPB INHALE 1 PUFF INTO THE LUNGS EVERY DAY 60 each 0  . vitamin E 400 UNIT capsule Take 1 tablet by mouth daily.      No current facility-administered medications for this  encounter.     Allergies  Allergen Reactions  . Bee Venom Hives     Past Medical History:  Diagnosis Date  . Anticoagulant long-term use    brillinta  . Bilateral lower extremity edema    feet, noticed since discharge from hospital admission 05-18-2018, "i was laid up in the bed for five days"   . CAD (coronary artery disease) cardiologist-- dr j. branch   hx STEMI 11-23-2017,  s/p  cardiac cath with Thrombectomy, PCI and DES x1 to midRCA, and other nonobstrucitve CAD involving pLAD, OM1, dRCA, posterior atrio, LVEF 45-50%,  LVEDP 76mHg  . COPD (chronic obstructive pulmonary disease) with emphysema ([facility]    pulmonology-- dr wert-- GOLD III mix (pt still smokes),  last exacerbation -due to the flu , [facility]  . DOE (dyspnea on exertion)   . Ectatic abdominal aorta ([place]    MRI 11/ 2019  . Hepatitis C GI-- dr rLaural Golden  dx 02-02-2018--- currently treated on antiviral medication  . Hepatocellular carcinoma ([place]   . History of radiation therapy 06/03/17- 06/09/17   Left Lung treated to 54 Gy with 3 fx of 18 Gy. SBRT  . History of skin cancer    04/ 2017 excision from face  . History of ST elevation myocardial infarction (STEMI) 11/23/2017   inferior wall-- s/p cardiac cath w/ thrombectomy, PCI, and DES  . HLD (hyperlipidemia)   . Hypertension   . Primary squamous cell carcinoma of left lung ([facility] oncologist-- dr sIsidore Moos  dx 02/ 2018-- Stage IA2 Left lower lung,  s/p  Stereotactic radiation completed 06-09-2017  . Productive cough    per pt mostly clear  . Requires supplemental oxygen    06-09-2018 currently pt is using a portable O2 as needed and at night  . S/P drug eluting coronary stent placement 11/23/2017   DES x1 to midRCA  . Urinary incontinence    wear depends  . Wears dentures    fuller upper,  lower partial  . Wears glasses     Past Surgical History:  Procedure Laterality Date  . COLONOSCOPY N/A 08/27/2012   Procedure: COLONOSCOPY;  Surgeon: NRogene Houston MD;  [facility];  Service: Endoscopy;  Laterality: N/A;  1200  . CORONARY STENT INTERVENTION N/A 11/23/2017   Procedure: CORONARY STENT INTERVENTION;  Surgeon: VJettie Booze MD;  [facility];  Service: Cardiovascular;  Laterality: N/A;  . IR RADIOLOGIST EVAL & MGMT  03/04/2018  . IR RADIOLOGIST EVAL & MGMT  10/06/2018  . IR RADIOLOGIST EVAL & MGMT  01/26/2019  . LEFT HEART CATH AND CORONARY ANGIOGRAPHY N/A 11/23/2017   Procedure: LEFT HEART CATH AND CORONARY ANGIOGRAPHY;  Surgeon: VJettie Booze MD;  [facility];  Service: Cardiovascular;  Laterality: N/A;  . RADIOFREQUENCY ABLATION N/A 06/10/2018   Procedure: MICROWAVE THERMAL ABLATION LIVER;  Surgeon:  YAletta Edouard MD;  [facility];  Service: Anesthesiology;  Laterality: N/A;      Observations/Objective: Wt Readings from Last 3 Encounters:  06/15/19 147 lb (66.7 kg)  02/02/19 145 lb (65.8 kg)  12/15/18 142 lb 8 oz (64.6 kg)   Temp Readings from Last 3 Encounters:  06/15/19 (!) 97.4 F (36.3 C) (Temporal)  02/02/19 (!) 97 F (36.1 C)  12/15/18 97.9 F (36.6 C) (Oral)   BP Readings from Last 3 Encounters:  06/15/19 102/61  02/02/19 (!) 146/77  12/15/18 133/82   Pulse Readings from  Last 3 Encounters:  06/15/19 60  02/02/19 88  12/15/18 65   Physical Exam Constitutional:      General: He is not in acute distress.    Appearance: Normal appearance. He is not ill-appearing, toxic-appearing or diaphoretic.  HENT:     Head: Atraumatic.  Eyes:     General:        Right eye: No discharge.        Left eye: No discharge.     Extraocular Movements: Extraocular movements intact.  Pulmonary:     Effort: Pulmonary effort is normal.  Neurological:     Mental Status: He is alert and oriented to person, place, and time.  Psychiatric:        Mood and Affect: Mood normal.        Behavior: Behavior normal.        Thought Content: Thought content normal.        Judgment: Judgment normal.      Assessment and Plan:  1. Sinus congestion   2. Shortness of breath   3. Productive cough   4. Chronic obstructive pulmonary disease, unspecified COPD type ([place])    Counseled patient that the most appropriate way to provide him an evaluation is in person.  He was agreeable to this, needs an in person lung exam and vital signs, consideration for chest x-ray.  Patient lives in [place] and is going to had to the clinic soon.   Follow Up Instructions:    I discussed the assessment and treatment plan with the patient. The patient was provided an opportunity to ask questions and all were answered. The patient agreed with the plan and demonstrated an understanding of the  instructions.   The patient was advised to call back or seek an in-person evaluation if the symptoms worsen or if the condition fails to improve as anticipated.  I provided 15 minutes of non-face-to-face time during this encounter.    Adam Eagles, PA-C  06/22/2019 4:45 PM         Adam Eagles, PA-C 06/22/19 1657

## 2019-06-22 NOTE — ED Triage Notes (Signed)
Pt had video visit earlier and was advised to be seen in person for cough and sinus congestion and increased sob

## 2019-06-28 DIAGNOSIS — J9611 Chronic respiratory failure with hypoxia: Secondary | ICD-10-CM | POA: Diagnosis not present

## 2019-06-28 DIAGNOSIS — J449 Chronic obstructive pulmonary disease, unspecified: Secondary | ICD-10-CM | POA: Diagnosis not present

## 2019-06-29 ENCOUNTER — Other Ambulatory Visit: Payer: Self-pay | Admitting: Interventional Radiology

## 2019-06-29 DIAGNOSIS — C220 Liver cell carcinoma: Secondary | ICD-10-CM

## 2019-07-06 DIAGNOSIS — J449 Chronic obstructive pulmonary disease, unspecified: Secondary | ICD-10-CM | POA: Diagnosis not present

## 2019-07-07 ENCOUNTER — Telehealth: Payer: Self-pay

## 2019-07-07 NOTE — Telephone Encounter (Signed)
Virtual Visit Pre-Appointment Phone Call  "(Name), I am calling you today to discuss your upcoming appointment. We are currently trying to limit exposure to the virus that causes COVID-19 by seeing patients at home rather than in the office."  1. "What is the BEST phone number to call the day of the visit?" - include this in appointment notes  2. "Do you have or have access to (through a family member/friend) a smartphone with video capability that we can use for your visit?" a. If yes - list this number in appt notes as "cell" (if different from BEST phone #) and list the appointment type as a VIDEO visit in appointment notes b. If no - list the appointment type as a PHONE visit in appointment notes  Confirm consent - "In the setting of the current Covid19 crisis, you are scheduled for a (phone or video) visit with your provider on (date) at (time).  Just as we do with many in-office visits, in order for you to participate in this visit, we must obtain consent.  If you'd like, I can send this to your mychart (if signed up) or email for you to review.  Otherwise, I can obtain your verbal consent now.  All virtual visits are billed to your insurance company just like a normal visit would be.  By agreeing to a virtual visit, we'd like you to understand that the technology does not allow for your provider to perform an examination, and thus may limit your provider's ability to fully assess your condition. If your provider identifies any concerns that need to be evaluated in person, we will make arrangements to do so.  Finally, though the technology is pretty good, we cannot assure that it will always work on either your or our end, and in the setting of a video visit, we may have to convert it to a phone-only visit.  In either situation, we cannot ensure that we have a secure connection.  Are you willing to proceed?" STAFF: Did the patient verbally acknowledge consent to telehealth visit? Document  YES/NO here:  3. Advise patient to be prepared - "Two hours prior to your appointment, go ahead and check your blood pressure, pulse, oxygen saturation, and your weight (if you have the equipment to check those) and write them all down. When your visit starts, your provider will ask you for this information. If you have an Apple Watch or Kardia device, please plan to have heart rate information ready on the day of your appointment. Please have a pen and paper handy nearby the day of the visit as well."  4. Give patient instructions for MyChart download to smartphone OR Doximity/Doxy.me as below if video visit (depending on what platform provider is using)  5. Inform patient they will receive a phone call 15 minutes prior to their appointment time (may be from unknown caller ID) so they should be prepared to answer    TELEPHONE CALL NOTE  Adam Bass has been deemed a candidate for a follow-up tele-health visit to limit community exposure during the Covid-19 pandemic. I spoke with the patient via phone to ensure availability of phone/video source, confirm preferred email & phone number, and discuss instructions and expectations.  I reminded Adam Bass to be prepared with any vital sign and/or heart rhythm information that could potentially be obtained via home monitoring, at the time of his visit. I reminded Adam Bass to expect a phone call prior to his visit.  [name] 07/07/2019 10:03 AM   INSTRUCTIONS FOR DOWNLOADING THE MYCHART APP TO SMARTPHONE  - The patient must first make sure to have activated MyChart and know their login information - If Apple, go to [employer] and type in MyChart in the search bar and download the app. If Android, ask patient to go to [employer] and type in [place] in the search bar and download the app. The app is free but as with any other app downloads, their phone may require them to verify saved payment information or Apple/Android  password.  - The patient will need to then log into the app with their MyChart username and password, and select [facility] as their healthcare provider to link the account. When it is time for your visit, go to the MyChart app, find appointments, and click Begin Video Visit. Be sure to Select Allow for your device to access the Microphone and Camera for your visit. You will then be connected, and your provider will be with you shortly.  **If they have any issues connecting, or need assistance please contact MyChart service desk [phone])**  **If using a computer, in order to ensure the best quality for their visit they will need to use either of the following Internet Browsers: [employer], or Google Chrome**  IF USING DOXIMITY or DOXY.ME - The patient will receive a link just prior to their visit by text.     FULL LENGTH CONSENT FOR TELE-HEALTH VISIT   I hereby voluntarily request, consent and authorize [facility] and its employed or contracted physicians, physician assistants, nurse practitioners or other licensed health care professionals (the Practitioner), to provide me with telemedicine health care services (the "Services") as deemed necessary by the treating Practitioner. I acknowledge and consent to receive the Services by the Practitioner via telemedicine. I understand that the telemedicine visit will involve communicating with the Practitioner through live audiovisual communication technology and the disclosure of certain medical information by electronic transmission. I acknowledge that I have been given the opportunity to request an in-person assessment or other available alternative prior to the telemedicine visit and am voluntarily participating in the telemedicine visit.  I understand that I have the right to withhold or withdraw my consent to the use of telemedicine in the course of my care at any time, without affecting my right to future care or treatment,  and that the Practitioner or I may terminate the telemedicine visit at any time. I understand that I have the right to inspect all information obtained and/or recorded in the course of the telemedicine visit and may receive copies of available information for a reasonable fee.  I understand that some of the potential risks of receiving the Services via telemedicine include:  Marland Kitchen Delay or interruption in medical evaluation due to technological equipment failure or disruption; . Information transmitted may not be sufficient (e.g. poor resolution of images) to allow for appropriate medical decision making by the Practitioner; and/or  . In rare instances, security protocols could fail, causing a breach of personal health information.  Furthermore, I acknowledge that it is my responsibility to provide information about my medical history, conditions and care that is complete and accurate to the best of my ability. I acknowledge that Practitioner's advice, recommendations, and/or decision may be based on factors not within their control, such as incomplete or inaccurate data provided by me or distortions of diagnostic images or specimens that may result from electronic transmissions. I understand that the practice  of medicine is not an Chief Strategy Officer and that Practitioner makes no warranties or guarantees regarding treatment outcomes. I acknowledge that I will receive a copy of this consent concurrently upon execution via email to the email address I last provided but may also request a printed copy by calling the office of [facility].    I understand that my insurance will be billed for this visit.   I have read or had this consent read to me. . I understand the contents of this consent, which adequately explains the benefits and risks of the Services being provided via telemedicine.  . I have been provided ample opportunity to ask questions regarding this consent and the Services and have had my questions  answered to my satisfaction. . I give my informed consent for the services to be provided through the use of telemedicine in my medical care  By participating in this telemedicine visit I agree to the above.

## 2019-07-14 ENCOUNTER — Other Ambulatory Visit (INDEPENDENT_AMBULATORY_CARE_PROVIDER_SITE_OTHER): Payer: Self-pay | Admitting: *Deleted

## 2019-07-14 ENCOUNTER — Ambulatory Visit (HOSPITAL_COMMUNITY)
Admission: RE | Admit: 2019-07-14 | Discharge: 2019-07-14 | Disposition: A | Discharge status: Home / Self Care | Payer: BC Managed Care – PPO | Source: Ambulatory Visit | Attending: Internal Medicine | Admitting: Internal Medicine

## 2019-07-14 ENCOUNTER — Telehealth (INDEPENDENT_AMBULATORY_CARE_PROVIDER_SITE_OTHER): Payer: Self-pay | Admitting: *Deleted

## 2019-07-14 ENCOUNTER — Other Ambulatory Visit: Payer: Self-pay

## 2019-07-14 DIAGNOSIS — C220 Liver cell carcinoma: Secondary | ICD-10-CM

## 2019-07-14 NOTE — Telephone Encounter (Signed)
Went for MRI today. Patient on O2. Per Audelia Acton in MRI patient needed assistance due do shortness of breathe. There can be no Oz in the MRI (truck)  Audelia Acton had done appointment for 08/03/19, if not okay call him.  Per Dr.Rehman make appointment at [facility] due to patient's diagnosis. I will call Audelia Acton and let him know.

## 2019-07-15 NOTE — Addendum Note (Signed)
Addended by: Grayland Ormond on: 07/15/2019 08:53 AM   Modules accepted: Orders

## 2019-07-15 NOTE — Telephone Encounter (Signed)
[facility] next available is 4/26, per Dr Laural Golden leave MRI sch;d at [facility] on 08/03/19

## 2019-07-19 DIAGNOSIS — J441 Chronic obstructive pulmonary disease with (acute) exacerbation: Secondary | ICD-10-CM | POA: Diagnosis not present

## 2019-07-22 ENCOUNTER — Telehealth: Payer: BC Managed Care – PPO

## 2019-07-27 ENCOUNTER — Encounter: Payer: Self-pay | Admitting: Cardiology

## 2019-07-27 ENCOUNTER — Telehealth (INDEPENDENT_AMBULATORY_CARE_PROVIDER_SITE_OTHER): Payer: BC Managed Care – PPO | Admitting: Cardiology

## 2019-07-27 VITALS — Ht 69.0 in | Wt 145.0 lb

## 2019-07-27 DIAGNOSIS — I2510 Atherosclerotic heart disease of native coronary artery without angina pectoris: Secondary | ICD-10-CM | POA: Diagnosis not present

## 2019-07-27 DIAGNOSIS — E785 Hyperlipidemia, unspecified: Secondary | ICD-10-CM | POA: Diagnosis not present

## 2019-07-27 DIAGNOSIS — Z79899 Other long term (current) drug therapy: Secondary | ICD-10-CM | POA: Diagnosis not present

## 2019-07-27 NOTE — Addendum Note (Signed)
Addended by: Levonne Hubert on: 07/27/2019 01:09 PM   Modules accepted: Orders

## 2019-07-27 NOTE — Progress Notes (Signed)
Virtual Visit via Telephone Note   This visit type was conducted due to national recommendations for restrictions regarding the COVID-19 Pandemic (e.g. social distancing) in an effort to limit this patient's exposure and mitigate transmission in our community.  Due to his co-morbid illnesses, this patient is at least at moderate risk for complications without adequate follow up.  This format is felt to be most appropriate for this patient at this time.  The patient did not have access to video technology/had technical difficulties with video requiring transitioning to audio format only (telephone).  All issues noted in this document were discussed and addressed.  No physical exam could be performed with this format.  Please refer to the patient's chart for his  consent to telehealth for [facility].   The patient was identified using 2 identifiers.  Date:  07/27/2019   ID:  Adam Bass, DOB May 23, 1950, MRN [id number]  Patient Location: Home Provider Location: Office  PCP:  [facility]  Cardiologist:  Adam Dolly, MD  Electrophysiologist:  None   Evaluation Performed:  Follow-Up Visit  Chief Complaint:  Follow up  History of Present Illness:    Adam Bass is a 69 y.o. male seen today for follow up of the following medical problems.   1. CAD - admit 11/2017 with inferior STEMI, mid RCA 100% lesion received thrombectomy and DES - 11/2017 echo LVEF 55-60% - medical therapy limited by soft bp's. Started on high dost statin but had LFT elevation and discontinued. Started on zetia 66m daily.    - no recent chest pain No SOB/DOE - compliant with meds  2.Tobacco history/COPD - followed by pcp  3. Liver mass/Hepatocellular carcinoma - followed by GI    5. Ectatic abdominal aorta - noted on 02/2018 MRI abdomen  6. Lung cancer - followed by oncology, stable recent scan and in remission.   7. Hep C   Has completed covid  vaccine x 2    The patient does not have symptoms concerning for COVID-19 infection (fever, chills, cough, or new shortness of breath).    Past Medical History:  Diagnosis Date  . Anticoagulant long-term use    brillinta  . Bilateral lower extremity edema    feet, noticed since discharge from hospital admission 05-18-2018, "i was laid up in the bed for five days"   . CAD (coronary artery disease) cardiologist-- Adam Bass   hx STEMI 11-23-2017,  s/p  cardiac cath with Thrombectomy, PCI and DES x1 to midRCA, and other nonobstrucitve CAD involving pLAD, OM1, dRCA, posterior atrio, LVEF 45-50%,  LVEDP 22mg  . COPD (chronic obstructive pulmonary disease) [facility]   pulmonology-- Adam wert-- GOLD III mix (pt still smokes),  last exacerbation -due to the flu , [facility] . DOE (dyspnea on exertion)   . [place]   [facility]  . Hepatitis C GI-- Adam Adam Bass dx 02-02-2018--- currently treated on antiviral medication  . Hepatocellular carcinoma ([facility]  . History of radiation therapy 06/03/17- 06/09/17   Left Lung treated to 54 Gy with 3 fx of 18 Gy. SBRT  . History of skin cancer    04/ 2017 excision from face  . History of ST elevation myocardial infarction (STEMI) 11/23/2017   inferior wall-- s/p cardiac cath w/ thrombectomy, PCI, and DES  . HLD (hyperlipidemia)   . Hypertension   . Primary squamous cell carcinoma of left  lung [facility]) oncologist-- Adam Adam Bass   dx 02/ 2018-- Stage IA2 Left lower lung,  s/p  Stereotactic radiation completed 06-09-2017  . Productive cough    per pt mostly clear  . Requires supplemental oxygen    06-09-2018 currently pt is using a portable O2 as needed and at night  . S/P drug eluting coronary stent placement 11/23/2017   DES x1 to midRCA  . Urinary incontinence    wear depends  . Wears dentures    fuller upper,  lower partial  . Wears glasses    Past Surgical History:  Procedure Laterality  Date  . COLONOSCOPY N/A 08/27/2012   Procedure: COLONOSCOPY;  Surgeon: Rogene Houston, MD;  Location[facility];  Service: Endoscopy;  Laterality: N/A;  1200  . CORONARY STENT INTERVENTION N/A 11/23/2017   Procedure: CORONARY STENT INTERVENTION;  Surgeon: Adam Booze, MD;  Location: [place] CV LAB;  Service: Cardiovascular;  Laterality: N/A;  . IR RADIOLOGIST EVAL & MGMT  03/04/2018  . IR RADIOLOGIST EVAL & MGMT  10/06/2018  . IR RADIOLOGIST EVAL & MGMT  01/26/2019  . LEFT HEART CATH AND CORONARY ANGIOGRAPHY N/A 11/23/2017   Procedure: LEFT HEART CATH AND CORONARY ANGIOGRAPHY;  Surgeon: Adam Booze, MD;  Location: [facility] CV LAB;  Service: Cardiovascular;  Laterality: N/A;  . RADIOFREQUENCY ABLATION N/A 06/10/2018   Procedure: MICROWAVE THERMAL ABLATION LIVER;  Surgeon: Adam Edouard, MD;  Location: [facility] ORS;  Service: Anesthesiology;  Laterality: N/A;     Current Meds  Medication Sig  . albuterol (PROAIR HFA) 108 (90 Base) MCG/ACT inhaler Inhale 2 puffs into the lungs every 6 (six) hours as needed for wheezing.   Adam Bass albuterol (PROVENTIL) 4 MG tablet Take 4 mg by mouth 3 (three) times daily.  Adam Bass aspirin EC 81 MG tablet Take 81 mg by mouth daily. Take with brilanta  . atorvastatin (LIPITOR) 20 MG tablet Take 1 tablet (20 mg total) by mouth daily.  . bisoprolol (ZEBETA) 5 MG tablet Take 0.5 tablets (2.5 mg total) by mouth daily.  . Blood Pressure KIT 1 Units by Does not apply route daily.  Adam Bass ezetimibe (ZETIA) 10 MG tablet Take 1 tablet (10 mg total) by mouth daily.  . famotidine (PEPCID) 20 MG tablet Take 20 mg by mouth daily as needed for heartburn or indigestion.  Adam Bass lisinopril (ZESTRIL) 2.5 MG tablet Take 2.5 mg by mouth daily.  . Multiple Vitamins-Minerals (MULTIVITAMIN WITH MINERALS) tablet Take 1 tablet by mouth daily.  . nitroGLYCERIN (NITROSTAT) 0.4 MG SL tablet PLACE 1 TABLET UNDER THE TONGUE EVERY 5 MINUTES AS NEEDED. FOLLOW MD DIRECTIONS FOR WHEN TO CALL 911    . OXYGEN Inhale into the lungs as needed. 4 Liters supplemental o2 , as needed "when im up moving around"  . pantoprazole (PROTONIX) 40 MG tablet Take 40 mg by mouth daily.   . Potassium 99 MG TABS Take 1 tablet by mouth daily.   . Tetrahydrozoline HCl (VISINE OP) Place 1 drop into both eyes daily as needed (Dry Eyes).  . TRELEGY ELLIPTA 100-62.5-25 MCG/INH AEPB INHALE 1 PUFF INTO THE LUNGS EVERY DAY  . vitamin E 400 UNIT capsule Take 1 tablet by mouth daily.      Allergies:   Bee venom   Social History   Tobacco Use  . Smoking status: Current Every Day Smoker    Packs/day: 0.75    Years: 50.00    Pack years: 37.50    Types: Cigars  .  Smokeless tobacco: Never Used  . Tobacco comment: . He is smoking about 5-10 cigarettes daily.   Substance Use Topics  . Alcohol use: Yes    Alcohol/week: 8.0 standard drinks    Types: 8 Cans of beer per week    Comment: 8 beers weekly, give or take   . Drug use: No     Family Hx: The patient's family history includes Breast cancer in his sister. There is no history of Colon cancer.  ROS:   Please see the history of present illness.     All other systems reviewed and are negative.   Prior CV studies:   The following studies were reviewed today:   Labs/Other Tests and Data Reviewed:    EKG:  No ECG reviewed.  Recent Labs: 12/15/2018: ALT 26; Hemoglobin 14.9; Platelets 305 01/21/2019: Creatinine, Ser 0.80   Recent Lipid Panel Lab Results  Component Value Date/Time   CHOL 140 11/24/2017 12:24 AM   TRIG 116 11/24/2017 12:24 AM   HDL 33 (L) 11/24/2017 12:24 AM   CHOLHDL 4.2 11/24/2017 12:24 AM   LDLCALC 84 11/24/2017 12:24 AM    Wt Readings from Last 3 Encounters:  07/27/19 145 lb (65.8 kg)  06/15/19 147 lb (66.7 kg)  02/02/19 145 lb (65.8 kg)     Objective:    Vital Signs:  Ht _0  (1.753 m)   Wt 145 lb (65.8 kg)   BMI 21.41 kg/m    Normal affect. Normal speech pattern and tone. Comfortable, no apparent distress. No  audible signs of sob or wheezing  ASSESSMENT & PLAN:    1. CAD -no recent symptoms - medical therapy has been limited due to soft bp - continue current meds - restarted statin low dose after it was cleared by GI. He did not go for his repeat CMET and FLP, will reorder. Pending cholesterol likely d/c zetia     COVID-19 Education: The signs and symptoms of COVID-19 were discussed with the patient and how to seek care for testing (follow up with PCP or arrange E-visit).  The importance of social distancing was discussed today.  Time:   Today, I have spent 16 minutes with the patient with telehealth technology discussing the above problems.     Medication Adjustments/Labs and Tests Ordered: Current medicines are reviewed at length with the patient today.  Concerns regarding medicines are outlined above.   Tests Ordered: No orders of the defined types were placed in this encounter.   Medication Changes: No orders of the defined types were placed in this encounter.   Follow Up:  Either In Person or Virtual in 1 year(s)  Signed, Adam Dolly, MD  07/27/2019 11:14 AM    [place]

## 2019-07-27 NOTE — Patient Instructions (Signed)
Medication Instructions:  Your physician recommends that you continue on your current medications as directed. Please refer to the Current Medication list given to you today.  *If you need a refill on your cardiac medications before your next appointment, please call your pharmacy*   Lab Work: Your physician recommends that you return for lab work in: Today   If you have labs (blood work) drawn today and your tests are completely normal, you will receive your results only by: Marland Kitchen MyChart Message (if you have MyChart) OR . A paper copy in the mail If you have any lab test that is abnormal or we need to change your treatment, we will call you to review the results.   Testing/Procedures: NONE    Follow-Up: At [facility], you and your health needs are our priority.  As part of our continuing mission to provide you with exceptional heart care, we have created designated Provider Care Teams.  These Care Teams include your primary Cardiologist (physician) and Advanced Practice Providers (APPs -  Physician Assistants and Nurse Practitioners) who all work together to provide you with the care you need, when you need it.  We recommend signing up for the patient portal called "MyChart".  Sign up information is provided on this After Visit Summary.  MyChart is used to connect with patients for Virtual Visits (Telemedicine).  Patients are able to view lab/test results, encounter notes, upcoming appointments, etc.  Non-urgent messages can be sent to your provider as well.   To learn more about what you can do with MyChart, go to [email or web address].    Your next appointment:   1 year(s)  The format for your next appointment:   In Person  Provider:   Carlyle Dolly, MD   Other Instructions Thank you for choosing [facility]!

## 2019-07-29 DIAGNOSIS — J9611 Chronic respiratory failure with hypoxia: Secondary | ICD-10-CM | POA: Diagnosis not present

## 2019-07-29 DIAGNOSIS — J449 Chronic obstructive pulmonary disease, unspecified: Secondary | ICD-10-CM | POA: Diagnosis not present

## 2019-08-03 ENCOUNTER — Ambulatory Visit (HOSPITAL_COMMUNITY)
Admission: RE | Admit: 2019-08-03 | Discharge: 2019-08-03 | Disposition: A | Discharge status: Home / Self Care | Payer: BC Managed Care – PPO | Source: Ambulatory Visit | Attending: Internal Medicine | Admitting: Internal Medicine

## 2019-08-03 ENCOUNTER — Other Ambulatory Visit: Payer: Self-pay

## 2019-08-03 DIAGNOSIS — C220 Liver cell carcinoma: Secondary | ICD-10-CM | POA: Diagnosis not present

## 2019-08-03 DIAGNOSIS — K7460 Unspecified cirrhosis of liver: Secondary | ICD-10-CM | POA: Diagnosis not present

## 2019-08-03 LAB — POCT I-STAT CREATININE: Creatinine, Ser: 0.8 mg/dL (ref 0.61–1.24)

## 2019-08-03 MED ORDER — GADOBUTROL 1 MMOL/ML IV SOLN
7.0000 mL | Freq: Once | INTRAVENOUS | Status: AC | PRN
  Administered 2019-08-03: 12:00:00 7 mL via INTRAVENOUS

## 2019-08-05 DIAGNOSIS — I2510 Atherosclerotic heart disease of native coronary artery without angina pectoris: Secondary | ICD-10-CM | POA: Diagnosis not present

## 2019-08-05 DIAGNOSIS — J302 Other seasonal allergic rhinitis: Secondary | ICD-10-CM | POA: Diagnosis not present

## 2019-08-05 DIAGNOSIS — Z9981 Dependence on supplemental oxygen: Secondary | ICD-10-CM | POA: Diagnosis not present

## 2019-08-05 DIAGNOSIS — Z1389 Encounter for screening for other disorder: Secondary | ICD-10-CM | POA: Diagnosis not present

## 2019-08-05 DIAGNOSIS — N400 Enlarged prostate without lower urinary tract symptoms: Secondary | ICD-10-CM | POA: Diagnosis not present

## 2019-08-05 DIAGNOSIS — Z6822 Body mass index (BMI) 22.0-22.9, adult: Secondary | ICD-10-CM | POA: Diagnosis not present

## 2019-08-05 DIAGNOSIS — J449 Chronic obstructive pulmonary disease, unspecified: Secondary | ICD-10-CM | POA: Diagnosis not present

## 2019-08-05 DIAGNOSIS — Z719 Counseling, unspecified: Secondary | ICD-10-CM | POA: Diagnosis not present

## 2019-08-05 DIAGNOSIS — Z23 Encounter for immunization: Secondary | ICD-10-CM | POA: Diagnosis not present

## 2019-08-05 DIAGNOSIS — F17200 Nicotine dependence, unspecified, uncomplicated: Secondary | ICD-10-CM | POA: Diagnosis not present

## 2019-08-05 DIAGNOSIS — I252 Old myocardial infarction: Secondary | ICD-10-CM | POA: Diagnosis not present

## 2019-08-06 DIAGNOSIS — J449 Chronic obstructive pulmonary disease, unspecified: Secondary | ICD-10-CM | POA: Diagnosis not present

## 2019-08-10 IMAGING — CT CT CHEST W/O CM
2 of 3 series · 15 of 36 positions shown, 18 images · non-contrast
Comparison: 02/19/2017

CLINICAL DATA: Followup lung cancer. Left lower lobe tumor. Status
post radiation therapy.

EXAM:
CT CHEST WITHOUT CONTRAST
TECHNIQUE: Multidetector CT imaging of the chest was performed following the
standard protocol without IV contrast.

[Series 2: thorax · axial · 0.66mm/px · z∈[+1146,+1472]mm · 12 of 193 slices shown, 15 images]
[im 15/193  mediastinal]
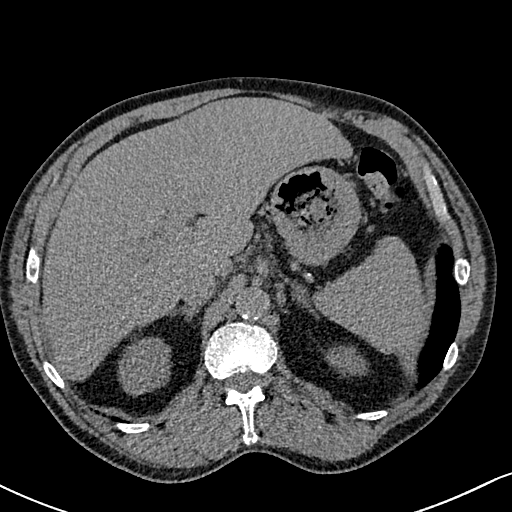
[im 15/193  lung]
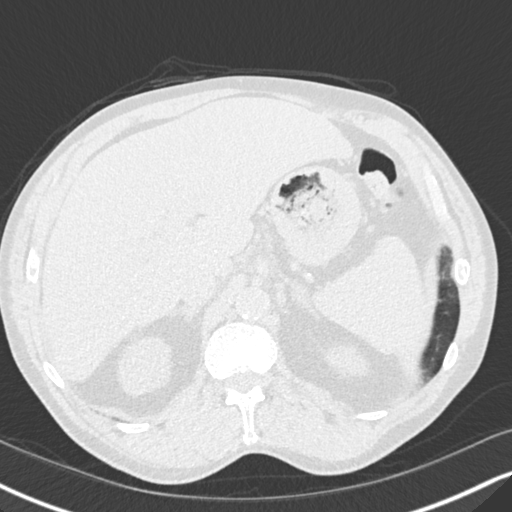
[im 29/193  lung]
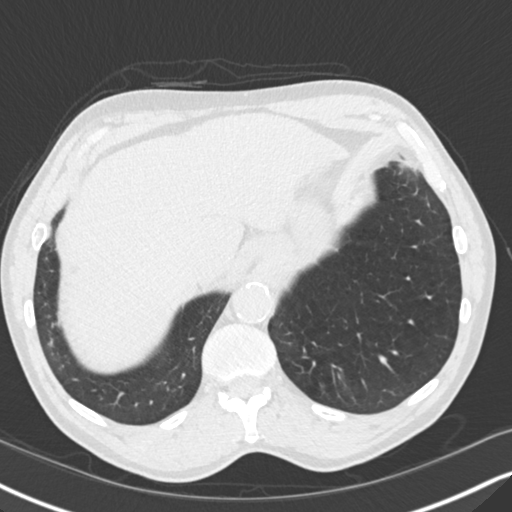
[im 43/193  lung]
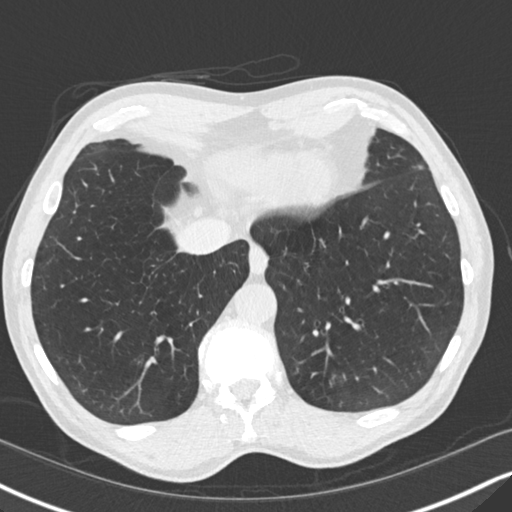
[im 57/193  lung]
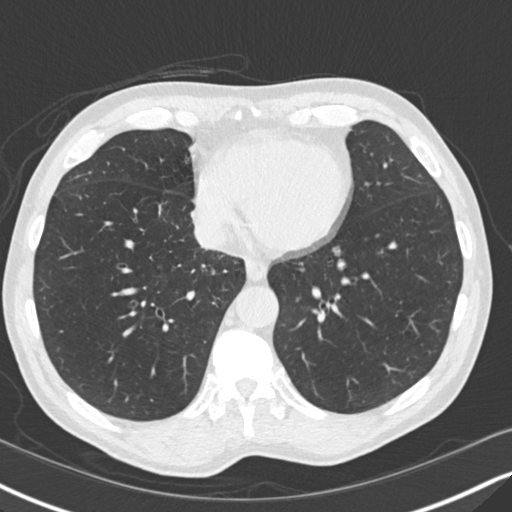
[im 72/193  mediastinal]
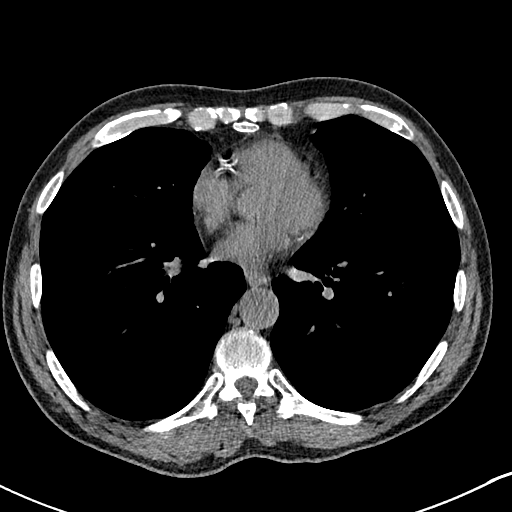
[im 72/193  lung]
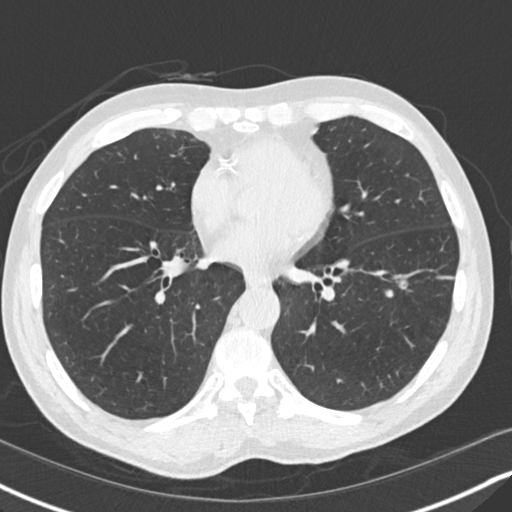
[im 86/193  lung]
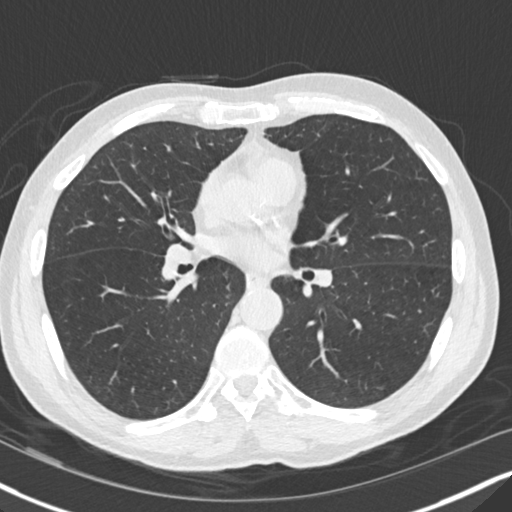
[im 107/193  lung]
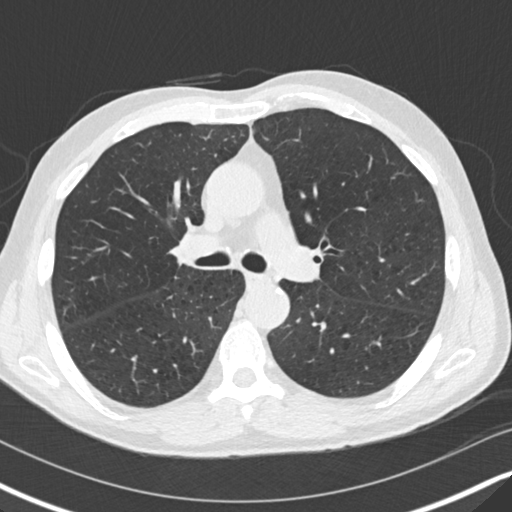
[im 121/193  lung]
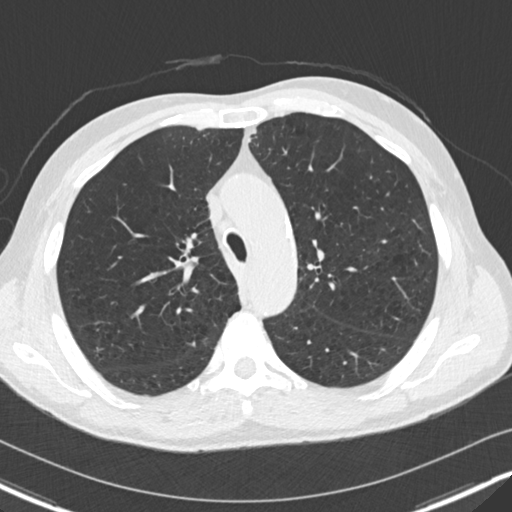
[im 136/193  mediastinal]
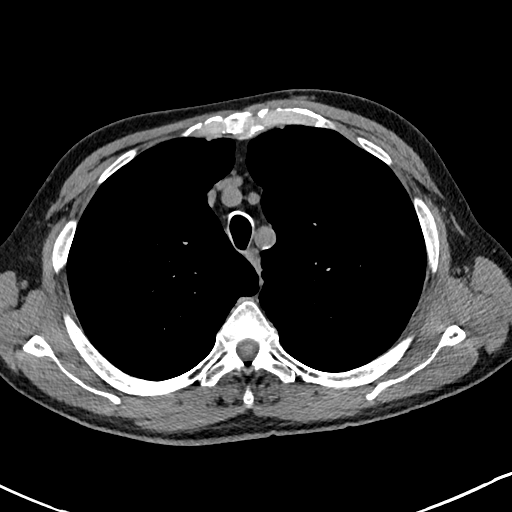
[im 136/193  lung]
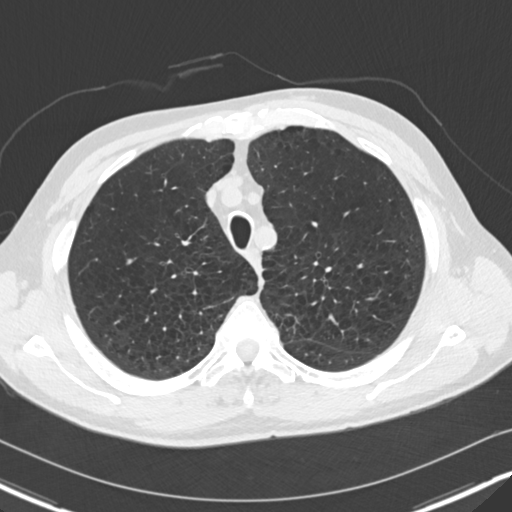
[im 150/193  lung]
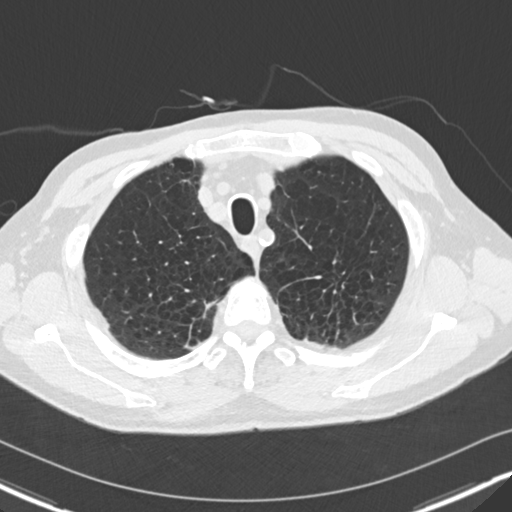
[im 164/193  lung]
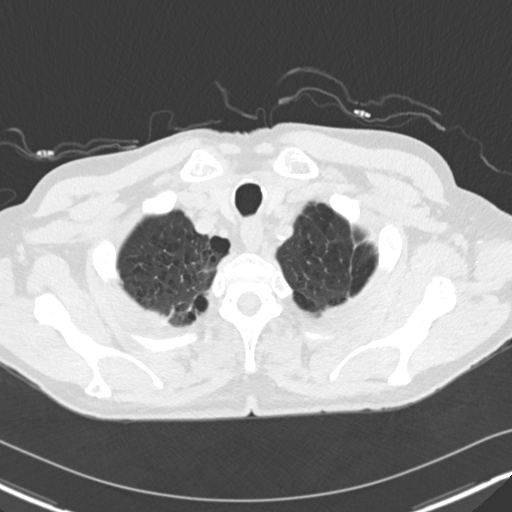
[im 178/193  lung]
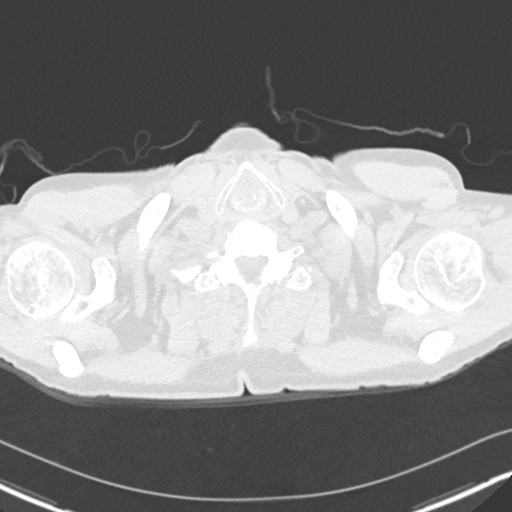

[Series 5: coronal · coronal · 0.75mm/px · 3 of 138 slices shown]
[im 28/138  lung]
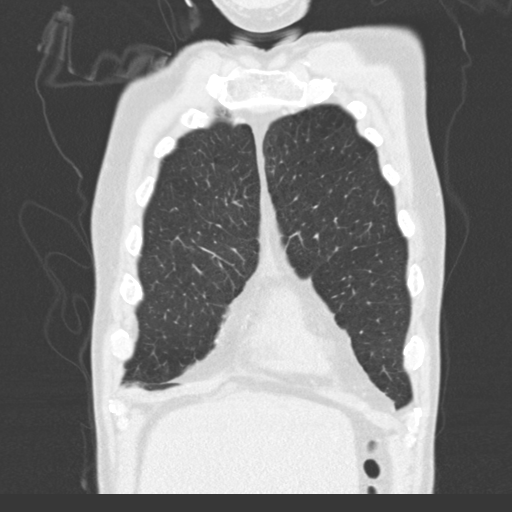
[im 55/138  lung]
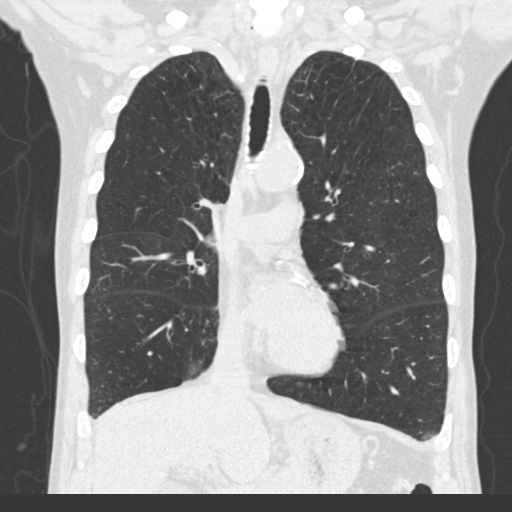
[im 83/138  lung]
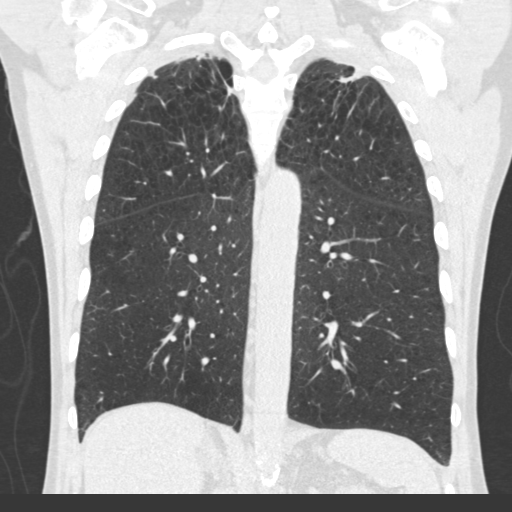

[15 of 36 positions shown; findings below may reference images not displayed]

FINDINGS: Cardiovascular: The heart size appears normal. Aortic
atherosclerosis. Calcification within the RCA, LAD and left
circumflex coronary artery.

Mediastinum/Nodes: Normal appearance of the thyroid gland. The
trachea appears patent and is midline. Normal appearance of the
esophagus. No enlarged mediastinal or hilar lymph nodes.

Lungs/Pleura: No pleural effusion. Advanced changes of emphysema
again noted. Diffuse bronchial wall thickening noted. No airspace
consolidation, atelectasis or pneumothorax. The index lesion within
the posterior left upper lobe measures 2.3 by 1.1 by 1.1 cm, image
38/4 and image 112/6. Unchanged. The index lesion within the left
lower lobe measures 0.8 x 0.7 cm, image 121/4. Previously 0.9 x
cm.New peripheral nodular densities within the posterior right lung
base are likely post inflammatory or infectious in etiology, image
164/4.

Upper Abdomen: No acute abnormality.

Musculoskeletal: No chest wall mass or suspicious bone lesions
identified.
IMPRESSION: 1. Decrease in size of left lower lobe pulmonary nodule. The left
upper lobe lung lesion is stable in the interval.
2. Diffuse bronchial wall thickening with emphysema, as above;
imaging findings suggestive of underlying COPD.. Emphysema
(P2SYS-[id number]).
3. Aortic atherosclerosis and 3 vessel coronary artery
atherosclerotic calcifications. Aortic Atherosclerosis
([id number]-RVZ.[id number]).
4. New patchy area of nodularity within the posterior right lung
base appears post infectious/inflammatory. Attention in this area on
follow-up imaging.

## 2019-08-11 ENCOUNTER — Ambulatory Visit
Admission: RE | Admit: 2019-08-11 | Discharge: 2019-08-11 | Disposition: A | Discharge status: Home / Self Care | Payer: BC Managed Care – PPO | Source: Ambulatory Visit | Attending: Interventional Radiology | Admitting: Interventional Radiology

## 2019-08-11 ENCOUNTER — Other Ambulatory Visit: Payer: Self-pay

## 2019-08-11 ENCOUNTER — Encounter: Payer: Self-pay | Admitting: *Deleted

## 2019-08-11 DIAGNOSIS — Z125 Encounter for screening for malignant neoplasm of prostate: Secondary | ICD-10-CM | POA: Diagnosis not present

## 2019-08-11 DIAGNOSIS — Z13220 Encounter for screening for lipoid disorders: Secondary | ICD-10-CM | POA: Diagnosis not present

## 2019-08-11 DIAGNOSIS — Z1389 Encounter for screening for other disorder: Secondary | ICD-10-CM | POA: Diagnosis not present

## 2019-08-11 DIAGNOSIS — Z8505 Personal history of malignant neoplasm of liver: Secondary | ICD-10-CM | POA: Diagnosis not present

## 2019-08-11 DIAGNOSIS — E782 Mixed hyperlipidemia: Secondary | ICD-10-CM | POA: Diagnosis not present

## 2019-08-11 DIAGNOSIS — Z23 Encounter for immunization: Secondary | ICD-10-CM | POA: Diagnosis not present

## 2019-08-11 DIAGNOSIS — C220 Liver cell carcinoma: Secondary | ICD-10-CM

## 2019-08-11 HISTORY — PX: IR RADIOLOGIST EVAL & MGMT: IMG5224

## 2019-08-11 NOTE — Progress Notes (Signed)
Chief Complaint: Patient was consulted remotely today (TeleHealth) for follow up after ablation of a hepatocellular carcinoma.  History of Present Illness: Adam Bass is a 69 y.o. male with a history of cirrhosis, hepatitis C and status post percutaneous thermal ablation of a 2.5 cm hepatocellular carcinoma of the left lobe of the liver on 06/10/2018 .Initial MRI on 10/02/2018 demonstrating a well-circumscribed ablation defect in segment 4A without enhancement.  There is suggestion of slight enlargement of a superior right hepatic enhancing lesion in segment 8 and two smaller enhancing lesions in left and right lobes. AFP level came down significantly after ablation.  Follow up MRI was performed on 08/03/2019. Adam Bass states that the scan was performed on a mobile truck at [facility] and that they had a difficult time finding extension tubing for oxygen prior to the scan.  Past Medical History:  Diagnosis Date  . Anticoagulant long-term use    brillinta  . Bilateral lower extremity edema    feet, noticed since discharge from hospital admission 05-18-2018, "i was laid up in the bed for five days"   . CAD (coronary artery disease) cardiologist-- dr j. branch   hx STEMI 11-23-2017,  s/p  cardiac cath with Thrombectomy, PCI and DES x1 to midRCA, and other nonobstrucitve CAD involving pLAD, OM1, dRCA, posterior atrio, LVEF 45-50%,  LVEDP 46mHg  . COPD (chronic obstructive pulmonary disease) with emphysema ([facility]    pulmonology-- dr wert-- GOLD III mix (pt still smokes),  last exacerbation -due to the flu , [facility]  . DOE (dyspnea on exertion)   . Ectatic abdominal aorta ([facility]    MRI 11/ 2019  . Hepatitis C GI-- dr rLaural Golden  dx 02-02-2018--- currently treated on antiviral medication  . Hepatocellular carcinoma ([facility]   . History of radiation therapy 06/03/17- 06/09/17   Left Lung treated to 54 Gy with 3 fx of 18 Gy. SBRT  . History of skin cancer    04/ 2017  excision from face  . History of ST elevation myocardial infarction (STEMI) 11/23/2017   inferior wall-- s/p cardiac cath w/ thrombectomy, PCI, and DES  . HLD (hyperlipidemia)   . Hypertension   . Primary squamous cell carcinoma of left lung ([facility] oncologist-- dr sIsidore Moos  dx 02/ 2018-- Stage IA2 Left lower lung,  s/p  Stereotactic radiation completed 06-09-2017  . Productive cough    per pt mostly clear  . Requires supplemental oxygen    06-09-2018 currently pt is using a portable O2 as needed and at night  . S/P drug eluting coronary stent placement 11/23/2017   DES x1 to midRCA  . Urinary incontinence    wear depends  . Wears dentures    fuller upper,  lower partial  . Wears glasses     Past Surgical History:  Procedure Laterality Date  . COLONOSCOPY N/A 08/27/2012   Procedure: COLONOSCOPY;  Surgeon: NRogene Houston MD;  [facility];  Service: Endoscopy;  Laterality: N/A;  1200  . CORONARY STENT INTERVENTION N/A 11/23/2017   Procedure: CORONARY STENT INTERVENTION;  Surgeon: VJettie Booze MD;  [facility];  Service: Cardiovascular;  Laterality: N/A;  . IR RADIOLOGIST EVAL & MGMT  03/04/2018  . IR RADIOLOGIST EVAL & MGMT  10/06/2018  . IR RADIOLOGIST EVAL & MGMT  01/26/2019  . IR RADIOLOGIST EVAL & MGMT  08/11/2019  . LEFT HEART CATH AND CORONARY ANGIOGRAPHY N/A 11/23/2017  Procedure: LEFT HEART CATH AND CORONARY ANGIOGRAPHY;  Surgeon: Varanasi, Jayadeep S, MD;  [facility];  Service: Cardiovascular;  Laterality: N/A;  . RADIOFREQUENCY ABLATION N/A 06/10/2018   Procedure: MICROWAVE THERMAL ABLATION LIVER;  Surgeon: Yamagata, Glenn, MD;  Location: [facility] ORS;  Service: Anesthesiology;  Laterality: N/A;    Allergies: Bee venom  Medications: Prior to Admission medications   Medication Sig Start Date End Date Taking? Authorizing Provider  albuterol (PROAIR HFA) 108 (90 Base) MCG/ACT inhaler Inhale 2 puffs into the lungs every 6 (six)  hours as needed for wheezing.     [provider]  albuterol (PROVENTIL) 4 MG tablet Take 4 mg by mouth 3 (three) times daily.    [provider]  aspirin EC 81 MG tablet Take 81 mg by mouth daily. Take with brilanta    [provider]  atorvastatin (LIPITOR) 20 MG tablet Take 1 tablet (20 mg total) by mouth daily. 02/26/19 07/27/19  Branch, Jonathan F, MD  bisoprolol (ZEBETA) 5 MG tablet Take 0.5 tablets (2.5 mg total) by mouth daily. 11/28/17   Roberts, Lindsay B, NP  Blood Pressure KIT 1 Units by Does not apply route daily. 11/27/17   Roberts, Lindsay B, NP  ezetimibe (ZETIA) 10 MG tablet Take 1 tablet (10 mg total) by mouth daily. 11/27/17 07/27/19  Roberts, Lindsay B, NP  famotidine (PEPCID) 20 MG tablet Take 20 mg by mouth daily as needed for heartburn or indigestion.    [provider]  fluticasone (FLONASE) 50 MCG/ACT nasal spray Place 1 spray into both nostrils daily for 14 days. 06/22/19 07/06/19  Avegno, Komlanvi S, FNP  lisinopril (ZESTRIL) 2.5 MG tablet Take 2.5 mg by mouth daily. 03/06/19   [provider]  Multiple Vitamins-Minerals (MULTIVITAMIN WITH MINERALS) tablet Take 1 tablet by mouth daily.    [provider]  nitroGLYCERIN (NITROSTAT) 0.4 MG SL tablet PLACE 1 TABLET UNDER THE TONGUE EVERY 5 MINUTES AS NEEDED. FOLLOW MD DIRECTIONS FOR WHEN TO CALL 911 05/18/19   Ingold, Laura R, NP  OXYGEN Inhale into the lungs as needed. 4 Liters supplemental o2 , as needed "when im up moving around"    [provider]  pantoprazole (PROTONIX) 40 MG tablet Take 40 mg by mouth daily.  02/02/18   [provider]  Potassium 99 MG TABS Take 1 tablet by mouth daily.     [provider]  Tetrahydrozoline HCl (VISINE OP) Place 1 drop into both eyes daily as needed (Dry Eyes).    [provider]  TRELEGY ELLIPTA 100-62.5-25 MCG/INH AEPB INHALE 1 PUFF INTO THE LUNGS EVERY DAY 12/16/18   Wert, Michael B, MD  vitamin E 400 UNIT  capsule Take 1 tablet by mouth daily.     [provider]     Family History  Problem Relation Age of Onset  . Breast cancer Sister   . Colon cancer Neg Hx     Social History   Socioeconomic History  . Marital status: Divorced    Spouse name: Not on file  . Number of children: Not on file  . Years of education: Not on file  . Highest education level: Not on file  Occupational History  . Not on file  Tobacco Use  . Smoking status: Current Every Day Smoker    Packs/day: 0.75    Years: 50.00    Pack years: 37.50    Types: Cigars  . Smokeless tobacco: Never Used  . Tobacco comment: .   He is smoking about 5-10 cigarettes daily.   Substance and Sexual Activity  . Alcohol use: Yes    Alcohol/week: 8.0 standard drinks    Types: 8 Cans of beer per week    Comment: 8 beers weekly, give or take   . Drug use: No  . Sexual activity: Not on file  Other Topics Concern  . Not on file  Social History Narrative  . Not on file   Social Determinants of Health   Financial Resource Strain:   . Difficulty of Paying Living Expenses:   Food Insecurity:   . Worried About Charity fundraiser in the Last Year:   . Arboriculturist in the Last Year:   Transportation Needs:   . Film/video editor (Medical):   Marland Kitchen Lack of Transportation (Non-Medical):   Physical Activity:   . Days of Exercise per Week:   . Minutes of Exercise per Session:   Stress:   . Feeling of Stress :   Social Connections:   . Frequency of Communication with Friends and Family:   . Frequency of Social Gatherings with Friends and Family:   . Attends Religious Services:   . Active Member of Clubs or Organizations:   . Attends Archivist Meetings:   Marland Kitchen Marital Status:     ECOG Status: 0 - Asymptomatic  Review of Systems  Constitutional: Negative.   Respiratory: Negative.   Cardiovascular: Negative.   Gastrointestinal: Negative.   Genitourinary: Negative.   Musculoskeletal: Negative.     Neurological: Negative.     Review of Systems: A 12 point ROS discussed and pertinent positives are indicated in the HPI above.  All other systems are negative.  Physical Exam No direct physical exam was performed (except for noted visual exam findings with Video Visits).   Vital Signs: There were no vitals taken for this visit.  Imaging: Adam Abdomen W Wo Contrast  Result Date: 08/03/2019 CLINICAL DATA:  Cirrhosis, history of HCC status post ablation EXAM: MRI ABDOMEN WITHOUT AND WITH CONTRAST TECHNIQUE: Multiplanar multisequence Adam imaging of the abdomen was performed both before and after the administration of intravenous contrast. CONTRAST:  74m GADAVIST GADOBUTROL 1 MMOL/ML IV SOLN COMPARISON:  MRI abdomen dated 01/21/2019 FINDINGS: Severely motion degraded images. Lower chest: Lung bases are clear. Hepatobiliary: Cirrhosis. Prior ablation in segment 4A, without residual enhancement. 11 mm enhancing lesion in segment 8 (series 13/image 24), without true central washout, pseudo capsule, or threshold growth. This reflects a LI-RADS 3 lesion. No additional lesions are evident on this motion degraded study. Gallbladder sludge, without associated inflammatory changes. No intrahepatic or extrahepatic ductal dilatation. Pancreas:  Within normal limits. Spleen:  Within normal limits. Adrenals/Urinary Tract:  Adrenal glands are within normal limits. Bilateral renal cysts, some of which are simple and some of which are hemorrhagic, including a dominant 2.1 cm cyst in the lateral left upper kidney with layering hemorrhage (series 20/image 15), benign (Bosniak 2). No enhancing renal lesions. No hydronephrosis. Stomach/Bowel: Stomach is within normal limits. Visualized bowel is grossly unremarkable. Vascular/Lymphatic:  No evidence of abdominal aortic aneurysm. No suspicious abdominal lymphadenopathy. Other:  No abdominal ascites. Musculoskeletal: Degenerative changes of the lumbar spine with lumbar  levoscoliosis. IMPRESSION: Limited evaluation due to severe motion degradation. Cirrhosis.  Stable ablation zone in segment 4A. 11 mm enhancing lesion in segment 8 (LI-RADS 3). Attention on follow-up is suggested. Given degree of motion degradation on the current study, consider liver protocol CT (with/without contrast) in 6 months.  Additional stable ancillary findings as above. Electronically Signed   By: Julian Hy M.D.   On: 08/03/2019 17:01   IR Radiologist Eval & Mgmt  Result Date: 08/11/2019 Please refer to notes tab for details about interventional procedure. (Op Note)   Labs:  CBC: Recent Labs    12/15/18 1124  WBC 9.6  HGB 14.9  HCT 44.5  PLT 305    COAGS: No results for input(s): INR, APTT in the last 8760 hours.  BMP: Recent Labs    10/02/18 1117 01/21/19 1432 08/03/19 1140  CREATININE 0.70 0.80 0.80    LIVER FUNCTION TESTS: Recent Labs    12/15/18 1124  BILITOT 0.7  AST 30  ALT 26  PROT 7.2    TUMOR MARKERS: Recent Labs    12/15/18 1124  AFPTM 17.2*    Assessment and Plan:  I reviewed MRI findings with Adam Bass by phone. The left lobe ablation defect appears stable and there is no evidence of tumor recurrence in segment IV. There is a roughly 10 mm area of arterial enhancement in segment VIII, considered an LR-3 lesion. The MRI is significantly impaired by respiratory motion.  I have also reviewed findings with Dr. Laural Golden. I recommended that a follow up MRI be performed on the higher field 3T MRI at [facility] in 3-6 months. Dr. Laural Golden will also be rechecking AFP level.  Electronically Signed: Azzie Roup 08/11/2019, 4:46 PM     I spent a total of  10 Minutes in remote  clinical consultation, greater than 50% of which was counseling/coordinating care for hepatocellular carcinoma.    Visit type: Audio only (telephone). Audio (no video) only due to patient's lack of internet/smartphone capability. Alternative for in-person  consultation at [facility], [address] [place], [place], [place]. This visit type was conducted due to national recommendations for restrictions regarding the COVID-19 Pandemic (e.g. social distancing).  This format is felt to be most appropriate for this patient at this time.  All issues noted in this document were discussed and addressed.

## 2019-08-18 DIAGNOSIS — C3432 Malignant neoplasm of lower lobe, left bronchus or lung: Secondary | ICD-10-CM | POA: Diagnosis not present

## 2019-08-18 DIAGNOSIS — J441 Chronic obstructive pulmonary disease with (acute) exacerbation: Secondary | ICD-10-CM | POA: Diagnosis not present

## 2019-08-18 DIAGNOSIS — Z720 Tobacco use: Secondary | ICD-10-CM | POA: Diagnosis not present

## 2019-08-18 DIAGNOSIS — Z515 Encounter for palliative care: Secondary | ICD-10-CM | POA: Diagnosis not present

## 2019-08-18 DIAGNOSIS — K7460 Unspecified cirrhosis of liver: Secondary | ICD-10-CM | POA: Diagnosis not present

## 2019-08-18 DIAGNOSIS — J449 Chronic obstructive pulmonary disease, unspecified: Secondary | ICD-10-CM | POA: Diagnosis not present

## 2019-08-28 ENCOUNTER — Ambulatory Visit
Admission: EM | Admit: 2019-08-28 | Discharge: 2019-08-28 | Disposition: A | Discharge status: Home / Self Care | Payer: Medicare Other | Attending: Emergency Medicine | Admitting: Emergency Medicine

## 2019-08-28 ENCOUNTER — Other Ambulatory Visit: Payer: Self-pay

## 2019-08-28 DIAGNOSIS — J9611 Chronic respiratory failure with hypoxia: Secondary | ICD-10-CM | POA: Diagnosis not present

## 2019-08-28 DIAGNOSIS — Z760 Encounter for issue of repeat prescription: Secondary | ICD-10-CM

## 2019-08-28 DIAGNOSIS — J449 Chronic obstructive pulmonary disease, unspecified: Secondary | ICD-10-CM

## 2019-08-28 MED ORDER — TRELEGY ELLIPTA 100-62.5-25 MCG/INH IN AEPB: INHALATION_SPRAY | RESPIRATORY_TRACT | 0 refills | Status: AC

## 2019-08-28 NOTE — ED Provider Notes (Signed)
[place]   [id number] 08/28/19 Arrival Time: 2549  CC: Medication refill  SUBJECTIVE:  Adam Bass is a 69 y.o. male who presents for trilogy inhaler medication refill.  Hx of COPD x multiple years.  Does have a PCP, but was unable to be seen today.  Complains of SOB, and cough, but is normal for patient.  On oxygen 2-4 L.  Denies HA,  lightheadedness, chest pain, wheezing, numbness or tingling in extremities, abdominal pain, changes in bowel or bladder habits.    ROS: As per HPI.  All other pertinent ROS negative.     Past Medical History:  Diagnosis Date  . Anticoagulant long-term use    brillinta  . Bilateral lower extremity edema    feet, noticed since discharge from hospital admission 05-18-2018, "i was laid up in the bed for five days"   . CAD (coronary artery disease) cardiologist-- dr j. branch   hx STEMI 11-23-2017,  s/p  cardiac cath with Thrombectomy, PCI and DES x1 to midRCA, and other nonobstrucitve CAD involving pLAD, OM1, dRCA, posterior atrio, LVEF 45-50%,  LVEDP 36mHg  . COPD (chronic obstructive pulmonary disease) with emphysema ([facility]    pulmonology-- dr wert-- GOLD III mix (pt still smokes),  last exacerbation -due to the flu , [facility]  . DOE (dyspnea on exertion)   . Ectatic abdominal aorta ([facility]    MRI 11/ 2019  . Hepatitis C GI-- dr rLaural Golden  dx 02-02-2018--- currently treated on antiviral medication  . Hepatocellular carcinoma ([place]   . History of radiation therapy 06/03/17- 06/09/17   Left Lung treated to 54 Gy with 3 fx of 18 Gy. SBRT  . History of skin cancer    04/ 2017 excision from face  . History of ST elevation myocardial infarction (STEMI) 11/23/2017   inferior wall-- s/p cardiac cath w/ thrombectomy, PCI, and DES  . HLD (hyperlipidemia)   . Hypertension   . Primary squamous cell carcinoma of left lung ([facility] oncologist-- dr sIsidore Moos  dx 02/ 2018-- Stage IA2 Left lower lung,  s/p  Stereotactic radiation  completed 06-09-2017  . Productive cough    per pt mostly clear  . Requires supplemental oxygen    06-09-2018 currently pt is using a portable O2 as needed and at night  . S/P drug eluting coronary stent placement 11/23/2017   DES x1 to midRCA  . Urinary incontinence    wear depends  . Wears dentures    fuller upper,  lower partial  . Wears glasses    Past Surgical History:  Procedure Laterality Date  . COLONOSCOPY N/A 08/27/2012   Procedure: COLONOSCOPY;  Surgeon: NRogene Houston MD;  [facility];  Service: Endoscopy;  Laterality: N/A;  1200  . CORONARY STENT INTERVENTION N/A 11/23/2017   Procedure: CORONARY STENT INTERVENTION;  Surgeon: VJettie Booze MD;  [place] [facility];  Service: Cardiovascular;  Laterality: N/A;  . IR RADIOLOGIST EVAL & MGMT  03/04/2018  . IR RADIOLOGIST EVAL & MGMT  10/06/2018  . IR RADIOLOGIST EVAL & MGMT  01/26/2019  . IR RADIOLOGIST EVAL & MGMT  08/11/2019  . LEFT HEART CATH AND CORONARY ANGIOGRAPHY N/A 11/23/2017   Procedure: LEFT HEART CATH AND CORONARY ANGIOGRAPHY;  Surgeon: VJettie Booze MD;  [place] [facility];  Service: Cardiovascular;  Laterality: N/A;  . RADIOFREQUENCY ABLATION N/A 06/10/2018   Procedure: MICROWAVE THERMAL ABLATION LIVER;  Surgeon: YAletta Edouard MD;  Location: [facility] ORS;  Service: Anesthesiology;  Laterality: N/A;   Allergies  Allergen Reactions  . Bee Venom Hives   No current facility-administered medications on file prior to encounter.   Current Outpatient Medications on File Prior to Encounter  Medication Sig Dispense Refill  . albuterol (PROAIR HFA) 108 (90 Base) MCG/ACT inhaler Inhale 2 puffs into the lungs every 6 (six) hours as needed for wheezing.     Marland Kitchen albuterol (PROVENTIL) 4 MG tablet Take 4 mg by mouth 3 (three) times daily.    Marland Kitchen aspirin EC 81 MG tablet Take 81 mg by mouth daily. Take with brilanta    . atorvastatin (LIPITOR) 20 MG tablet Take 1 tablet (20 mg total) by  mouth daily. 90 tablet 3  . bisoprolol (ZEBETA) 5 MG tablet Take 0.5 tablets (2.5 mg total) by mouth daily. 60 tablet 1  . Blood Pressure KIT 1 Units by Does not apply route daily. 1 each 0  . ezetimibe (ZETIA) 10 MG tablet Take 1 tablet (10 mg total) by mouth daily. 90 tablet 1  . famotidine (PEPCID) 20 MG tablet Take 20 mg by mouth daily as needed for heartburn or indigestion.    . fluticasone (FLONASE) 50 MCG/ACT nasal spray Place 1 spray into both nostrils daily for 14 days. 16 g 0  . lisinopril (ZESTRIL) 2.5 MG tablet Take 2.5 mg by mouth daily.    . Multiple Vitamins-Minerals (MULTIVITAMIN WITH MINERALS) tablet Take 1 tablet by mouth daily.    . nitroGLYCERIN (NITROSTAT) 0.4 MG SL tablet PLACE 1 TABLET UNDER THE TONGUE EVERY 5 MINUTES AS NEEDED. FOLLOW MD DIRECTIONS FOR WHEN TO CALL 911 25 tablet 4  . OXYGEN Inhale into the lungs as needed. 4 Liters supplemental o2 , as needed "when im up moving around"    . pantoprazole (PROTONIX) 40 MG tablet Take 40 mg by mouth daily.   5  . Potassium 99 MG TABS Take 1 tablet by mouth daily.     . Tetrahydrozoline HCl (VISINE OP) Place 1 drop into both eyes daily as needed (Dry Eyes).    . vitamin E 400 UNIT capsule Take 1 tablet by mouth daily.      Social History   Socioeconomic History  . Marital status: Divorced    Spouse name: Not on file  . Number of children: Not on file  . Years of education: Not on file  . Highest education level: Not on file  Occupational History  . Not on file  Tobacco Use  . Smoking status: Current Every Day Smoker    Packs/day: 0.75    Years: 50.00    Pack years: 37.50    Types: Cigars  . Smokeless tobacco: Never Used  . Tobacco comment: . He is smoking about 5-10 cigarettes daily.   Substance and Sexual Activity  . Alcohol use: Yes    Alcohol/week: 8.0 standard drinks    Types: 8 Cans of beer per week    Comment: 8 beers weekly, give or take   . Drug use: No  . Sexual activity: Not on file  Other Topics  Concern  . Not on file  Social History Narrative  . Not on file   Social Determinants of Health   Financial Resource Strain:   . Difficulty of Paying Living Expenses:   Food Insecurity:   . Worried About Charity fundraiser in the Last Year:   . Arboriculturist in the Last Year:   Transportation Needs:   .  Lack of Transportation (Medical):   Marland Kitchen Lack of Transportation (Non-Medical):   Physical Activity:   . Days of Exercise per Week:   . Minutes of Exercise per Session:   Stress:   . Feeling of Stress :   Social Connections:   . Frequency of Communication with Friends and Family:   . Frequency of Social Gatherings with Friends and Family:   . Attends Religious Services:   . Active Member of Clubs or Organizations:   . Attends Archivist Meetings:   Marland Kitchen Marital Status:   Intimate Partner Violence:   . Fear of Current or Ex-Partner:   . Emotionally Abused:   Marland Kitchen Physically Abused:   . Sexually Abused:    Family History  Problem Relation Age of Onset  . Breast cancer Sister   . Colon cancer Neg Hx     OBJECTIVE:  Vitals:   08/28/19 1140  BP: (!) 143/61  Pulse: 82  Resp: (!) 24  Temp: 99.1 F (37.3 C)  SpO2: 90%    General appearance: alert; no distress Eyes: PERRLA; EOMI HENT: normocephalic; atraumatic Neck: supple with FROM Lungs: clear to auscultation bilaterally Heart: difficult to assess heart sounds Extremities: no edema; symmetrical with no gross deformities Skin: warm and dry Neurologic: ambulates without difficulty Psychological: alert and cooperative; normal mood and affect   ASSESSMENT & PLAN:  1. Chronic obstructive pulmonary disease, unspecified COPD type ([place])   2. Medication refill     Meds ordered this encounter  Medications  . Fluticasone-Umeclidin-Vilant (TRELEGY ELLIPTA) 100-62.5-25 MCG/INH AEPB    Sig: INHALE 1 PUFF INTO THE LUNGS EVERY DAY    Dispense:  60 each    Refill:  0    No further refills defer to pcp    Order  Specific Question:   Supervising Provider    Answer:   Raylene Everts [[id number]]    Medication refill Use as directed Follow up with PCP as needed Return or go to the ED if you have any new or worsening symptoms such as vision changes, fatigue, dizziness, chest pain, shortness of breath, nausea, swelling in your hands or feet, urinary symptoms, etc...  Reviewed expectations re: course of current medical issues. Questions answered. Outlined signs and symptoms indicating need for more acute intervention. Patient verbalized understanding. After Visit Summary given.   Lestine Box, PA-C 08/28/19 1217

## 2019-08-28 NOTE — Discharge Instructions (Signed)
Medication refill Use as directed Follow up with PCP as needed Return or go to the ED if you have any new or worsening symptoms such as vision changes, fatigue, dizziness, chest pain, shortness of breath, nausea, swelling in your hands or feet, urinary symptoms, etc..Marland Kitchen

## 2019-08-28 NOTE — ED Triage Notes (Signed)
Pt needs refill for trilagy inhaler

## 2019-08-31 ENCOUNTER — Encounter (INDEPENDENT_AMBULATORY_CARE_PROVIDER_SITE_OTHER): Payer: Self-pay | Admitting: *Deleted

## 2019-09-03 ENCOUNTER — Telehealth: Payer: Self-pay | Admitting: *Deleted

## 2019-09-03 NOTE — Telephone Encounter (Signed)
Called patient to inform that his test will need to be pre-certed through new insurance, then his test will be scheduled and his fu will be rescheduled to accomdate his scan, patient verified understanding this

## 2019-09-05 DIAGNOSIS — J449 Chronic obstructive pulmonary disease, unspecified: Secondary | ICD-10-CM | POA: Diagnosis not present

## 2019-09-07 ENCOUNTER — Ambulatory Visit: Payer: BC Managed Care – PPO | Admitting: Radiation Oncology

## 2019-09-15 DIAGNOSIS — C3432 Malignant neoplasm of lower lobe, left bronchus or lung: Secondary | ICD-10-CM | POA: Diagnosis not present

## 2019-09-15 DIAGNOSIS — J449 Chronic obstructive pulmonary disease, unspecified: Secondary | ICD-10-CM | POA: Diagnosis not present

## 2019-09-15 DIAGNOSIS — K7460 Unspecified cirrhosis of liver: Secondary | ICD-10-CM | POA: Diagnosis not present

## 2019-09-15 DIAGNOSIS — Z515 Encounter for palliative care: Secondary | ICD-10-CM | POA: Diagnosis not present

## 2019-09-17 ENCOUNTER — Other Ambulatory Visit: Payer: Self-pay

## 2019-09-17 ENCOUNTER — Ambulatory Visit: Payer: Medicare Other | Admitting: Internal Medicine

## 2019-09-17 ENCOUNTER — Encounter: Payer: Self-pay | Admitting: Internal Medicine

## 2019-09-17 DIAGNOSIS — J449 Chronic obstructive pulmonary disease, unspecified: Secondary | ICD-10-CM | POA: Diagnosis not present

## 2019-09-17 DIAGNOSIS — J9611 Chronic respiratory failure with hypoxia: Secondary | ICD-10-CM | POA: Insufficient documentation

## 2019-09-17 DIAGNOSIS — I10 Essential (primary) hypertension: Secondary | ICD-10-CM | POA: Diagnosis not present

## 2019-09-17 MED ORDER — PREDNISONE 10 MG PO TABS: ORAL_TABLET | ORAL | 0 refills | Status: DC

## 2019-09-17 NOTE — Progress Notes (Signed)
Subjective:     Patient ID: Adam Bass, male   DOB: 05/31/1950,    MRN: [id number]    Brief patient profile:  69 yowm active cigar smoker changed form cigs around 2005 dx as copd 2011 maintained on spiriva and then anoro  referred to pulmonary clinic 08/07/2017 by Dr Adam Bass for sob/ followed by Dr Adam Bass    Primary cancer of left lower lobe of lung ([place]) LtLL Lung Bx: Positive for presence of malignant cells, consistent with squamous cell carcinoma. IHC -- positive for CK5/6, p63 & negative for TTF-1 & Napsin-A  - Clinical stage from 04/30/2017: Stage IA2 (cT1b, cN0, cM0) - Signed by Adam Gibson, MD on 04/30/2017  Radiation treatment dates:06/03/2017 - 06/09/2017 Site/dose:Left lung treated to 54 Gy in 3 fractions of 18 Gy     08/07/2017 1st [facility] office visit/ Adam Bass  GOLD III copd / on anoro maint  Chief Complaint  Patient presents with  . Consult    Found it follow up on a previous noudle, started radiation 3 treatment. Follow up ct next month, Wheezing and some cough.  on anoro daily baseline rare saba need   but for at least one month feeling needs saba rx every  day and not usually  noct Assoc cough is mucoid/ worse in am/ still smoking cigars  Doe = MMRC3 = can't walk 100 yards even at a slow pace at a flat grade s stopping due to sob   rec If not improving >> Prednisone 10 mg take  4 each am x 2 days,   2 each am x 2 days,  1 each am x 2 days and stop  Plan A = Automatic = Trelegy one click first thing each am, take two drags  Plan B = Backup Only use your albuterol as a rescue medication to be used if you can't catch your breath by resting or doing a relaxed purse lip breathing pattern.  - The less you use it, the better it will work when you need it. - Ok to use the inhaler up to 2 puffs  every 4 hours if you must but call for appointment if use goes up over your usual need - Don't leave home without it !!  (think of it like the spare tire for  your car)  Plan C = Crisis - only use your albuterol nebulizer if you first try Plan B and it fails to help > ok to use the nebulizer up to every 4 hours but if start needing it regularly call for immediate appointment   09/17/2019  f/u ov/Adam Bass re: ? aecopd  On acei and trelegy  Chief Complaint  Patient presents with  . Follow-up    Last seen here in 2019 and then followed by Dr Adam Bass. He states has good days and bad days with breathing. He states breathing "may be getting a little worse".   He is using albuterol inhaler and neb 2 x daily and albuterol tablets 2-3 x per day.   Dyspnea:  MMRC3 = can't walk 100 yards even at a slow pace at a flat grade s stopping due to sob   Cough: dry / hoarseness  Sleeping: esp at hs / like choking  SABA use: multiple forms over using   02: 2lpm hs and up  Up to 4lpm    No obvious day to day or daytime variability or assoc excess/ purulent sputum or mucus plugs or hemoptysis or cp or chest tightness, subjective  wheeze or overt sinus or hb symptoms.    Also denies any obvious fluctuation of symptoms with weather or environmental changes or other aggravating or alleviating factors except as outlined above   No unusual exposure hx or h/o childhood pna/ asthma or knowledge of premature birth.  Current Allergies, Complete Past Medical History, Past Surgical History, Family History, and Social History were reviewed in [employer] record.  ROS  The following are not active complaints unless bolded Hoarseness, sore throat, dysphagia, dental problems, itching, sneezing,  nasal congestion or discharge of excess mucus or purulent secretions, ear ache,   fever, chills, sweats, unintended wt loss or wt gain, classically pleuritic or exertional cp,  orthopnea pnd or arm/hand swelling  or leg swelling, presyncope, palpitations, abdominal pain, anorexia, nausea, vomiting, diarrhea  or change in bowel habits or change in bladder habits, change in  stools or change in urine, dysuria, hematuria,  rash, arthralgias, visual complaints, headache, numbness, weakness or ataxia or problems with walking or coordination,  change in mood or  memory.        Current Meds  Medication Sig  . albuterol (PROAIR HFA) 108 (90 Base) MCG/ACT inhaler Inhale 2 puffs into the lungs every 6 (six) hours as needed for wheezing.   Marland Kitchen albuterol (PROVENTIL) (2.5 MG/3ML) 0.083% nebulizer solution 1 vial in neb 4 x daily as needed  . albuterol (PROVENTIL) 4 MG tablet Take 4 mg by mouth 3 (three) times daily.  Marland Kitchen aspirin EC 81 MG tablet Take 81 mg by mouth daily. Take with brilanta  . atorvastatin (LIPITOR) 20 MG tablet Take 1 tablet (20 mg total) by mouth daily.  . bisoprolol (ZEBETA) 5 MG tablet Take 0.5 tablets (2.5 mg total) by mouth daily.  . Blood Pressure KIT 1 Units by Does not apply route daily.  . famotidine (PEPCID) 20 MG tablet Take 20 mg by mouth daily as needed for heartburn or indigestion.  . Fluticasone-Umeclidin-Vilant (TRELEGY ELLIPTA) 100-62.5-25 MCG/INH AEPB INHALE 1 PUFF INTO THE LUNGS EVERY DAY  . lisinopril (ZESTRIL) 2.5 MG tablet Take 2.5 mg by mouth daily.  . Multiple Vitamins-Minerals (MULTIVITAMIN WITH MINERALS) tablet Take 1 tablet by mouth daily.  . nitroGLYCERIN (NITROSTAT) 0.4 MG SL tablet PLACE 1 TABLET UNDER THE TONGUE EVERY 5 MINUTES AS NEEDED. FOLLOW MD DIRECTIONS FOR WHEN TO CALL 911  . OXYGEN Inhale into the lungs as needed. 4 Liters supplemental o2 , as needed "when im up moving around"  . pantoprazole (PROTONIX) 40 MG tablet Take 40 mg by mouth daily.   . Potassium 99 MG TABS Take 1 tablet by mouth daily.   . Tetrahydrozoline HCl (VISINE OP) Place 1 drop into both eyes daily as needed (Dry Eyes).  . vitamin E 400 UNIT capsule Take 1 tablet by mouth daily.                    Objective:   Physical Exam    09/17/2019          143   08/07/17 174 lb (78.9 kg)  07/14/17 161 lb 4.8 oz (73.2 kg)  07/04/17 153 lb 6.4 oz (69.6  kg)      Vital signs reviewed  09/17/2019  - Note at rest 02 sats  96% on 2lpm pulsed      HEENT : pt wearing mask not removed for exam due to covid - 19 concerns.    NECK :  without JVD/Nodes/TM/ nl carotid upstrokes bilaterally   LUNGS: no  acc muscle use,  Mild barrel  contour chest wall with bilateral  Distant bs s audible wheeze and  without cough on insp or exp maneuvers  and mild  Hyperresonant  to  percussion bilaterally     CV:  RRR  no s3 or murmur or increase in P2, and no edema   ABD:  soft and nontender with pos end  insp Hoover's  in the supine position. No bruits or organomegaly appreciated, bowel sounds nl  MS:   Nl gait/  ext warm without deformities, calf tenderness, cyanosis or clubbing No obvious joint restrictions   SKIN: warm and dry without lesions    NEURO:  alert, approp, nl sensorium with  no motor or cerebellar deficits apparent.     I personally reviewed images and agree with radiology impression as follows:  Pa and lat cxr 06/22/19 Stable exam.  Emphysema.  No active disease.      Assessment:

## 2019-09-17 NOTE — Assessment & Plan Note (Signed)
As of 09/17/2019  2lpm 24/7   Advised  Make sure you check your oxygen saturations at highest level of activity to be sure it stays over 90% and adjust upward to maintain this level if needed but remember to turn it back to previous settings when you stop (to conserve your supply).

## 2019-09-17 NOTE — Assessment & Plan Note (Signed)
D/c acei 09/17/2019 due to cough/ noct choking   In the best review of chronic cough to date ( NEJM 2016 375 [id number]) ,  ACEi are now felt to cause cough in up to  20% of pts which is a 4 fold increase from previous reports and does not include the variety of non-specific complaints we see in pulmonary clinic in pts on ACEi but previously attributed to another dx like  Copd/asthma and  include PNDS, throat and chest congestion, "bronchitis", unexplained dyspnea and noct "strangling or choking" sensations, and hoarseness, but also  atypical /refractory GERD symptoms like dysphagia and "bad heartburn"   The only way I know  to prove this is not an "ACEi Case" is a trial off ACEi x a minimum of 6 weeks then regroup.   Since only on 2.5 mg daily try off and monitor bp x 4 weeks then regroup          Each maintenance medication was reviewed in detail including emphasizing most importantly the difference between maintenance and prns and under what circumstances the prns are to be triggered using an action plan format where appropriate.  Total time for H and P, chart review, counseling, teaching devices/02 rx  and generating customized AVS unique to this office visit to re-establish with pt not seen in > 2y  / charting = 30 min

## 2019-09-17 NOTE — Assessment & Plan Note (Addendum)
Active cigar smoker PFT's  08/07/2017  FEV1 1.52 (47 % ) ratio 37  p 17 % improvement from saba p ? prior to study with DLCO  37/36 % corrects to 38  % for alv volume   - 08/07/2017  After extensive coaching inhaler device  effectiveness =    90% with DPI - d/c acei 09/17/2019   Atypical aecopd may be due to acei > try off x 4 weeks and regroup. ? AB component > Prednisone 10 mg take  4 each am x 2 days,   2 each am x 2 days,  1 each am x 2 days and stop  ? gerd component > max rx x 4 weeks   Note overusing saba I spent extra time with pt today reviewing appropriate use of albuterol for prn use on exertion with the following points: 1) saba is for relief of sob that does not improve by walking a slower pace or resting but rather if the pt does not improve after trying this first. 2) If the pt is convinced, as many are, that saba helps recover from activity faster then it's easy to tell if this is the case by re-challenging : ie stop, take the inhaler, then p 5 minutes try the exact same activity (intensity of workload) that just caused the symptoms and see if they are substantially diminished or not after saba 3) if there is an activity that reproducibly causes the symptoms, try the saba 15 min before the activity on alternate days   If in fact the saba really does help, then fine to continue to use it prn but advised may need to look closer at the maintenance regimen being used to achieve better control of airways disease with exertion.

## 2019-09-17 NOTE — Patient Instructions (Addendum)
Stop lisinopril and the albuterol tablets  And all smoking   Prednisone 10 mg take  4 each am x 2 days,   2 each am x 2 days,  1 each am x 2 days and stop  Continue the Pantoprazole (protonix) 40 mg   Take  30-60 min before first meal of the day and Pepcid (famotidine)  20 mg one after supper until return to office - this is the best way to tell whether stomach acid is contributing to your problem.     Plan A = Automatic = Always=    Trelegy one click take two drags   Plan B = Backup (to supplement plan A, not to replace it) Only use your albuterol inhaler as a rescue medication to be used if you can't catch your breath by resting or doing a relaxed purse lip breathing pattern.  - The less you use it, the better it will work when you need it. - Ok to use the inhaler up to 2 puffs  every 4 hours if you must but call for appointment if use goes up over your usual need - Don't leave home without it !!  (think of it like the spare tire for your car)   Plan C = Crisis (instead of Plan B but only if Plan B stops working) - only use your albuterol nebulizer if you first try Plan B and it fails to help > ok to use the nebulizer up to every 4 hours but if start needing it regularly call for immediate appointment     Please schedule a follow up office visit in 4 weeks, sooner if needed  with all medications /inhalers/ solutions in hand so we can verify exactly what you are taking. This includes all medications from all doctors and over the counters

## 2019-09-28 ENCOUNTER — Other Ambulatory Visit: Payer: Self-pay | Admitting: Cardiology

## 2019-10-06 ENCOUNTER — Encounter (INDEPENDENT_AMBULATORY_CARE_PROVIDER_SITE_OTHER): Payer: Self-pay | Admitting: *Deleted

## 2019-10-12 ENCOUNTER — Other Ambulatory Visit (INDEPENDENT_AMBULATORY_CARE_PROVIDER_SITE_OTHER): Payer: Self-pay | Admitting: Internal Medicine

## 2019-10-12 DIAGNOSIS — C220 Liver cell carcinoma: Secondary | ICD-10-CM

## 2019-10-12 NOTE — Progress Notes (Signed)
Please ask patient to go lab for AFP next week.

## 2019-10-13 DIAGNOSIS — Z515 Encounter for palliative care: Secondary | ICD-10-CM | POA: Diagnosis not present

## 2019-10-13 DIAGNOSIS — J449 Chronic obstructive pulmonary disease, unspecified: Secondary | ICD-10-CM | POA: Diagnosis not present

## 2019-10-13 DIAGNOSIS — C3432 Malignant neoplasm of lower lobe, left bronchus or lung: Secondary | ICD-10-CM | POA: Diagnosis not present

## 2019-10-13 DIAGNOSIS — K7460 Unspecified cirrhosis of liver: Secondary | ICD-10-CM | POA: Diagnosis not present

## 2019-10-15 ENCOUNTER — Ambulatory Visit: Payer: Medicare Other | Admitting: Internal Medicine

## 2019-10-15 ENCOUNTER — Encounter: Payer: Self-pay | Admitting: Internal Medicine

## 2019-10-15 ENCOUNTER — Other Ambulatory Visit: Payer: Self-pay

## 2019-10-15 DIAGNOSIS — J9611 Chronic respiratory failure with hypoxia: Secondary | ICD-10-CM | POA: Diagnosis not present

## 2019-10-15 DIAGNOSIS — I10 Essential (primary) hypertension: Secondary | ICD-10-CM | POA: Diagnosis not present

## 2019-10-15 DIAGNOSIS — J449 Chronic obstructive pulmonary disease, unspecified: Secondary | ICD-10-CM | POA: Diagnosis not present

## 2019-10-15 MED ORDER — MONTELUKAST SODIUM 10 MG PO TABS: ORAL_TABLET | ORAL | 11 refills | Status: DC

## 2019-10-15 MED ORDER — MONTELUKAST SODIUM 10 MG PO TABS: ORAL_TABLET | ORAL | 2 refills | Status: DC

## 2019-10-15 MED ORDER — BREZTRI AEROSPHERE 160-9-4.8 MCG/ACT IN AERO: 2.0000 | INHALATION_SPRAY | Freq: Two times a day (BID) | RESPIRATORY_TRACT | 11 refills | Status: DC

## 2019-10-15 MED ORDER — PREDNISONE 10 MG PO TABS: ORAL_TABLET | ORAL | 0 refills | Status: DC

## 2019-10-15 MED ORDER — BREZTRI AEROSPHERE 160-9-4.8 MCG/ACT IN AERO: 2.0000 | INHALATION_SPRAY | Freq: Two times a day (BID) | RESPIRATORY_TRACT | 0 refills | Status: DC

## 2019-10-15 NOTE — Assessment & Plan Note (Signed)
As of 09/17/2019  2lpm 24/7   Advised:  Make sure you check your oxygen saturations at highest level of activity to be sure it stays over 90% and adjust upward to maintain this level if needed but remember to turn it back to previous settings when you stop (to conserve your supply).           Each maintenance medication was reviewed in detail including emphasizing most importantly the difference between maintenance and prns and under what circumstances the prns are to be triggered using an action plan format where appropriate.  Total time for H and P, chart review, counseling, teaching device and generating customized AVS unique to this office visit / charting = 30 min

## 2019-10-15 NOTE — Progress Notes (Signed)
Subjective:     Patient ID: Adam Bass, male   DOB: [birth year],    MRN: [id number]    Brief patient profile:  51 yowm active cigar smoker changed form cigs around 2005 dx as copd 2011 maintained on spiriva and then anoro  referred to pulmonary clinic 08/07/2017 by Dr Lanell Persons for sob/ followed by Dr Luan Pulling    Primary cancer of left lower lobe of lung ([place]) LtLL Lung Bx: Positive for presence of malignant cells, consistent with squamous cell carcinoma. IHC -- positive for CK5/6, p63 & negative for TTF-1 & Napsin-A  - Clinical stage from 04/30/2017: Stage IA2 (cT1b, cN0, cM0) - Signed by Eppie Gibson, MD on 04/30/2017  Radiation treatment dates:06/03/2017 - 06/09/2017 Site/dose:Left lung treated to 54 Gy in 3 fractions of 18 Gy     08/07/2017 1st [facility] office visit/ [name]  GOLD III copd / on anoro maint  Chief Complaint  Patient presents with  . Consult    Found it follow up on a previous noudle, started radiation 3 treatment. Follow up ct next month, Wheezing and some cough.  on anoro daily baseline rare saba need   but for at least one month feeling needs saba rx every  day and not usually  noct Assoc cough is mucoid/ worse in am/ still smoking cigars  Doe = MMRC3 = can't walk 100 yards even at a slow pace at a flat grade s stopping due to sob   rec If not improving >> Prednisone 10 mg take  4 each am x 2 days,   2 each am x 2 days,  1 each am x 2 days and stop  Plan A = Automatic = Trelegy one click first thing each am, take two drags  Plan B = Backup Only use your albuterol as a rescue medication to be used if you can't catch your breath by resting or doing a relaxed purse lip breathing pattern.  - The less you use it, the better it will work when you need it. - Ok to use the inhaler up to 2 puffs  every 4 hours if you must but call for appointment if use goes up over your usual need - Don't leave home without it !!  (think of it like the spare tire for  your car)  Plan C = Crisis - only use your albuterol nebulizer if you first try Plan B and it fails to help > ok to use the nebulizer up to every 4 hours but if start needing it regularly call for immediate appointment   09/17/2019  f/u ov/[name] re: ? aecopd  On acei and trelegy  Chief Complaint  Patient presents with  . Follow-up    Last seen here in 2019 and then followed by Dr Luan Pulling. He states has good days and bad days with breathing. He states breathing "may be getting a little worse".   He is using albuterol inhaler and neb 2 x daily and albuterol tablets 2-3 x per day.   Dyspnea:  MMRC3 = can't walk 100 yards even at a slow pace at a flat grade s stopping due to sob   Cough: dry / hoarseness  Sleeping: esp at hs / like choking  SABA use: multiple forms over using   02: 2lpm hs and up  Up to 4lpm  rec Stop lisinopril and the albuterol tablets  And all smoking  Prednisone 10 mg take  4 each am x 2 days,   2  each am x 2 days,  1 each am x 2 days and stop  Continue the Pantoprazole (protonix) 40 mg   Take  30-60 min before first meal of the day and Pepcid (famotidine)  20 mg one after supper until return to office - this is the best way to tell whether stomach acid is contributing to your problem.    Plan A = Automatic = Always=    Trelegy one click take two drags  Plan B = Backup (to supplement plan A, not to replace it) Only use your albuterol inhaler   Plan C = Crisis (instead of Plan B but only if Plan B stops working) - only use your albuterol nebulizer if you first try Plan B and it fails to help       10/15/2019  f/u ov/[name] re: GOLD III/group D/ 02 dep  Chief Complaint  Patient presents with  . Follow-up    Breathing is about the same since the last visit. He is asking for rx for singulair to help with "allergies"- wheezing. He is using his albuterol inhaler about 4 x per day.   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   Sleeping  without nocturnal  or early am exacerbation  of respiratory  c/o's or need for noct saba. Also denies any obvious fluctuation of symptoms with weather or environmental changes or other aggravating or alleviating factors except as outlined above   No unusual exposure hx or h/o childhood pna/ asthma or knowledge of premature birth.  Current Allergies, Complete Past Medical History, Past Surgical History, Family History, and Social History were reviewed in [employer] record.  ROS  The following are not active complaints unless bolded Hoarseness, sore throat, dysphagia, dental problems, itching, sneezing,  nasal congestion or discharge of excess mucus or purulent secretions, ear ache,   fever, chills, sweats, unintended wt loss or wt gain, classically pleuritic or exertional cp,  orthopnea pnd or arm/hand swelling  or leg swelling, presyncope, palpitations, abdominal pain, anorexia, nausea, vomiting, diarrhea  or change in bowel habits or change in bladder habits, change in stools or change in urine, dysuria, hematuria,  rash, arthralgias, visual complaints, headache, numbness, weakness or ataxia or problems with walking or coordination,  change in mood or  memory.        Current Meds  Medication Sig  . albuterol (PROAIR HFA) 108 (90 Base) MCG/ACT inhaler Inhale 2 puffs into the lungs every 6 (six) hours as needed for wheezing.   Marland Kitchen albuterol (PROVENTIL) (2.5 MG/3ML) 0.083% nebulizer solution 1 vial in neb 4 x daily as needed  . aspirin EC 81 MG tablet Take 81 mg  by mouth daily. Take with brilanta  . atorvastatin (LIPITOR) 20 MG tablet Take 1 tablet (20 mg total) by mouth daily.  . bisoprolol (ZEBETA) 5 MG tablet Take 0.5 tablets (2.5 mg total) by mouth daily.  . famotidine (PEPCID) 20 MG tablet Take 20 mg  by mouth daily as needed for heartburn or indigestion.  . Fluticasone-Umeclidin-Vilant (TRELEGY ELLIPTA) 100-62.5-25 MCG/INH AEPB INHALE 1 PUFF INTO THE LUNGS EVERY DAY  . melatonin 5 MG TABS Take 5 mg by mouth at bedtime as needed.  . Multiple Vitamins-Minerals (MULTIVITAMIN WITH MINERALS) tablet Take 1 tablet by mouth daily.  . nitroGLYCERIN (NITROSTAT) 0.4 MG SL tablet PLACE 1 TABLET UNDER THE TONGUE EVERY 5 MINUTES AS NEEDED. FOLLOW MD DIRECTIONS FOR WHEN TO CALL 911  . Omega-3 Fatty Acids (FISH OIL PO) Take 1 capsule by mouth daily.  . OXYGEN Inhale into the lungs as needed. 4 Liters supplemental o2 , as needed "when im up moving around"  . oxymetazoline (AFRIN) 0.05 % nasal spray Place 1 spray into both nostrils 2 (two) times daily.  . pantoprazole (PROTONIX) 40 MG tablet Take 40 mg by mouth daily.   . tamsulosin (FLOMAX) 0.4 MG CAPS capsule Take 0.4 mg by mouth daily.                       Objective:   Physical Exam    10/15/2019           138   09/17/2019          143   08/07/17 174 lb (78.9 kg)  07/14/17 161 lb 4.8 oz (73.2 kg)  07/04/17 153 lb 6.4 oz (69.6 kg)      Vital signs reviewed  10/15/2019  - Note at rest 02 sats  95% on 2lpm pulsed       amb thin wm hoarse with gruff sounding cough otherwise nad   HEENT : pt wearing mask not removed for exam due to covid - 19 concerns.    NECK :  without JVD/Nodes/TM/ nl carotid upstrokes bilaterally   LUNGS: no acc muscle use,  Mild barrel  contour chest wall with bilateral  Distant bs s audible wheeze and  without cough on insp or exp maneuvers  and mild  Hyperresonant  to  percussion bilaterally     CV:  RRR  no s3 or murmur or increase in P2, and no edema   ABD:  soft and  nontender with pos end  insp Hoover's  in the supine position. No bruits or organomegaly appreciated, bowel sounds nl  MS:   Nl gait/  ext warm without deformities, calf tenderness, cyanosis or clubbing No obvious joint restrictions   SKIN: warm and dry without lesions    NEURO:  alert, approp, nl sensorium with  no motor or cerebellar deficits apparent.                 Assessment:

## 2019-10-15 NOTE — Patient Instructions (Addendum)
We will refill your singulair and also rec Prednisone 10 mg take  4 each am x 2 days,   2 each am x 2 days,  1 each am x 2 days and stop   Leave your 02 on 2lpm bedtime and rest,  4lpm with activity   Plan A = Automatic = Always=    Breztri (instead of Trelegy) Take 2 puffs first thing in am and then another 2 puffs about 12 hours later.   .Work on inhaler technique:  relax and gently blow all the way out then take a nice smooth deep breath back in, triggering the inhaler at same time you start breathing in.  Hold for up to 5 seconds if you can. Blow out thru nose. Rinse and gargle with water when done      Plan B = Backup (to supplement plan A, not to replace it) Only use your albuterol inhaler as a rescue medication to be used if you can't catch your breath by resting or doing a relaxed purse lip breathing pattern.  - The less you use it, the better it will work when you need it. - Ok to use the inhaler up to 2 puffs  every 4 hours if you must but call for appointment if use goes up over your usual need - Don't leave home without it !!  (think of it like the spare tire for your car)   Plan C = Crisis (instead of Plan B but only if Plan B stops working) - only use your albuterol nebulizer if you first try Plan B and it fails to help > ok to use the nebulizer up to every 4 hours but if start needing it regularly call for immediate appointment     Please schedule a follow up visit in 3 months but call sooner if needed

## 2019-10-15 NOTE — Assessment & Plan Note (Signed)
D/c acei 09/17/2019 due to cough/ noct choking  > improved some 10/15/2019   Although even in retrospect it may not be clear the ACEi contributed to the pt's symptoms,   adding them back at this point or in the future would risk confusion in interpretation of non-specific respiratory symptoms to which this patient is prone  ie  Better not to muddy the waters here.   Adequate control on present rx, reviewed in detail with pt > no change in rx needed

## 2019-10-15 NOTE — Assessment & Plan Note (Addendum)
Active cigar smoker PFT's  08/07/2017  FEV1 1.52 (47 % ) ratio 37  p 17 % improvement from saba p ? prior to study with DLCO  37/36 % corrects to 38  % for alv volume   - d/c acei 09/17/2019  - 10/15/2019  After extensive coaching inhaler device,  effectiveness =   75% (short ti) try breztri 2bid    Group D in terms of symptom/risk and laba/lama/ICS  therefore appropriate rx at this point >>>  Change trelegy to breztri plus Prednisone 10 mg take  4 each am x 2 days,   2 each am x 2 days,  1 each am x 2 days and stop   Added back singulair   I spent extra time with pt today reviewing appropriate use of albuterol for prn use on exertion with the following points: 1) saba is for relief of sob that does not improve by walking a slower pace or resting but rather if the pt does not improve after trying this first. 2) If the pt is convinced, as many are, that saba helps recover from activity faster then it's easy to tell if this is the case by re-challenging : ie stop, take the inhaler, then p 5 minutes try the exact same activity (intensity of workload) that just caused the symptoms and see if they are substantially diminished or not after saba 3) if there is an activity that reproducibly causes the symptoms, try the saba 15 min before the activity on alternate days   If in fact the saba really does help, then fine to continue to use it prn but advised may need to look closer at the maintenance regimen being used to achieve better control of airways disease with exertion.

## 2019-10-16 ENCOUNTER — Other Ambulatory Visit: Payer: Self-pay | Admitting: Cardiology

## 2019-11-18 ENCOUNTER — Other Ambulatory Visit: Payer: Self-pay | Admitting: Internal Medicine

## 2019-12-06 ENCOUNTER — Ambulatory Visit
Admission: EM | Admit: 2019-12-06 | Discharge: 2019-12-06 | Disposition: A | Discharge status: Home / Self Care | Payer: Medicare Other | Attending: Emergency Medicine | Admitting: Emergency Medicine

## 2019-12-06 DIAGNOSIS — L853 Xerosis cutis: Secondary | ICD-10-CM | POA: Diagnosis not present

## 2019-12-06 DIAGNOSIS — M25829 Other specified joint disorders, unspecified elbow: Secondary | ICD-10-CM | POA: Diagnosis not present

## 2019-12-06 MED ORDER — HYDROCORTISONE 1 % EX CREA: TOPICAL_CREAM | CUTANEOUS | 0 refills | Status: DC

## 2019-12-06 NOTE — ED Provider Notes (Addendum)
[place]   [id number] 12/06/19 Arrival Time: 1526   Chief Complaint  Patient presents with  . Rash     SUBJECTIVE: History from: patient.  Adam Bass is a 68 y.o. male who presented to the urgent care with a complaint of knot to right elbow for the past few days.  Reported friend noticed it today.  Denies pain.  Denies any alleviating or aggravating factor.  Has not tried any medication.  He denies similar symptoms in the past.  Denies chills, nausea, vomiting, diarrhea.  Patient also complaining of rash at the nasal philtrum.  Reported he has been using oxygen tube for breathing.  Developed a rash today after.  Has not tried any OTC medication.  Denies similar symptoms in the past.  Denies alleviating or aggravating factors.  ROS: As per HPI.  All other pertinent ROS negative.     Past Medical History:  Diagnosis Date  . Anticoagulant long-term use    brillinta  . Bilateral lower extremity edema    feet, noticed since discharge from hospital admission 05-18-2018, "i was laid up in the bed for five days"   . CAD (coronary artery disease) cardiologist-- dr j. branch   hx STEMI 11-23-2017,  s/p  cardiac cath with Thrombectomy, PCI and DES x1 to midRCA, and other nonobstrucitve CAD involving pLAD, OM1, dRCA, posterior atrio, LVEF 45-50%,  LVEDP 41mmHg  . COPD (chronic obstructive pulmonary disease) with emphysema [facility])    pulmonology-- dr wert-- GOLD III mix (pt still smokes),  last exacerbation -due to the flu , discharged 05-18-2018 at [facility]   . DOE (dyspnea on exertion)   . Ectatic abdominal aorta ([place])    MRI 11/ 2019  . Hepatitis C GI-- dr Laural Golden   dx 02-02-2018--- currently treated on antiviral medication  . Hepatocellular carcinoma ([place])   . History of radiation therapy 06/03/17- 06/09/17   Left Lung treated to 54 Gy with 3 fx of 18 Gy. SBRT  . History of skin cancer    04/ 2017 excision from face  . History of ST elevation myocardial infarction  (STEMI) 11/23/2017   inferior wall-- s/p cardiac cath w/ thrombectomy, PCI, and DES  . HLD (hyperlipidemia)   . Hypertension   . Primary squamous cell carcinoma of left lung [facility]) oncologist-- dr Isidore Moos   dx 02/ 2018-- Stage IA2 Left lower lung,  s/p  Stereotactic radiation completed 06-09-2017  . Productive cough    per pt mostly clear  . Requires supplemental oxygen    06-09-2018 currently pt is using a portable O2 as needed and at night  . S/P drug eluting coronary stent placement 11/23/2017   DES x1 to midRCA  . Urinary incontinence    wear depends  . Wears dentures    fuller upper,  lower partial  . Wears glasses    Past Surgical History:  Procedure Laterality Date  . COLONOSCOPY N/A 08/27/2012   Procedure: COLONOSCOPY;  Surgeon: Rogene Houston, MD;  Location[facility];  Service: Endoscopy;  Laterality: N/A;  1200  . CORONARY STENT INTERVENTION N/A 11/23/2017   Procedure: CORONARY STENT INTERVENTION;  Surgeon: Jettie Booze, MD;  Location: [place] CV LAB;  Service: Cardiovascular;  Laterality: N/A;  . IR RADIOLOGIST EVAL & MGMT  03/04/2018  . IR RADIOLOGIST EVAL & MGMT  10/06/2018  . IR RADIOLOGIST EVAL & MGMT  01/26/2019  . IR RADIOLOGIST EVAL & MGMT  08/11/2019  . LEFT HEART CATH AND CORONARY  ANGIOGRAPHY N/A 11/23/2017   Procedure: LEFT HEART CATH AND CORONARY ANGIOGRAPHY;  Surgeon: Jettie Booze, MD;  Location: [place] CV LAB;  Service: Cardiovascular;  Laterality: N/A;  . RADIOFREQUENCY ABLATION N/A 06/10/2018   Procedure: MICROWAVE THERMAL ABLATION LIVER;  Surgeon: Aletta Edouard, MD;  Location: [facility] ORS;  Service: Anesthesiology;  Laterality: N/A;   Allergies  Allergen Reactions  . Bee Venom Hives   No current facility-administered medications on file prior to encounter.   Current Outpatient Medications on File Prior to Encounter  Medication Sig Dispense Refill  . albuterol (PROAIR HFA) 108 (90 Base) MCG/ACT inhaler Inhale 2 puffs into the  lungs every 6 (six) hours as needed for wheezing.     Marland Kitchen albuterol (PROVENTIL) (2.5 MG/3ML) 0.083% nebulizer solution 1 vial in neb 4 x daily as needed    . aspirin EC 81 MG tablet Take 81 mg by mouth daily. Take with brilanta    . atorvastatin (LIPITOR) 20 MG tablet Take 1 tablet (20 mg total) by mouth daily. 90 tablet 3  . bisoprolol (ZEBETA) 5 MG tablet TAKE 1/2 TABLET BY MOUTH EVERY DAY 45 tablet 3  . Budeson-Glycopyrrol-Formoterol (BREZTRI AEROSPHERE) 160-9-4.8 MCG/ACT AERO Inhale 2 puffs into the lungs 2 (two) times daily. 10.7 g 11  . Budeson-Glycopyrrol-Formoterol (BREZTRI AEROSPHERE) 160-9-4.8 MCG/ACT AERO Inhale 2 puffs into the lungs 2 (two) times daily. 5.9 g 0  . ezetimibe (ZETIA) 10 MG tablet Take 1 tablet (10 mg total) by mouth daily. 90 tablet 1  . famotidine (PEPCID) 20 MG tablet Take 20 mg by mouth daily as needed for heartburn or indigestion.    . Fluticasone-Umeclidin-Vilant (TRELEGY ELLIPTA) 100-62.5-25 MCG/INH AEPB INHALE 1 PUFF INTO THE LUNGS EVERY DAY 60 each 0  . melatonin 5 MG TABS Take 5 mg by mouth at bedtime as needed.    . montelukast (SINGULAIR) 10 MG tablet One at bedtime every night 30 tablet 11  . Multiple Vitamins-Minerals (MULTIVITAMIN WITH MINERALS) tablet Take 1 tablet by mouth daily.    . nitroGLYCERIN (NITROSTAT) 0.4 MG SL tablet PLACE 1 TABLET UNDER THE TONGUE EVERY 5 MINUTES AS NEEDED. FOLLOW MD DIRECTIONS FOR WHEN TO CALL 911 25 tablet 5  . Omega-3 Fatty Acids (FISH OIL PO) Take 1 capsule by mouth daily.    . OXYGEN Inhale into the lungs as needed. 4 Liters supplemental o2 , as needed "when im up moving around"    . oxymetazoline (AFRIN) 0.05 % nasal spray Place 1 spray into both nostrils 2 (two) times daily.    . pantoprazole (PROTONIX) 40 MG tablet Take 40 mg by mouth daily.   5  . predniSONE (DELTASONE) 10 MG tablet Take  4 each am x 2 days,   2 each am x 2 days,  1 each am x 2 days and stop 14 tablet 0  . tamsulosin (FLOMAX) 0.4 MG CAPS capsule Take  0.4 mg by mouth daily.     Social History   Socioeconomic History  . Marital status: Divorced    Spouse name: Not on file  . Number of children: Not on file  . Years of education: Not on file  . Highest education level: Not on file  Occupational History  . Not on file  Tobacco Use  . Smoking status: Current Every Day Smoker    Packs/day: 0.75    Years: 50.00    Pack years: 37.50    Types: Cigars  . Smokeless tobacco: Never Used  . Tobacco comment: . He  is smoking about 5-10 cigarettes daily.   Vaping Use  . Vaping Use: Former  . Quit date: 10/07/2017  Substance and Sexual Activity  . Alcohol use: Yes    Alcohol/week: 8.0 standard drinks    Types: 8 Cans of beer per week    Comment: 8 beers weekly, give or take   . Drug use: No  . Sexual activity: Not on file  Other Topics Concern  . Not on file  Social History Narrative  . Not on file   Social Determinants of Health   Financial Resource Strain:   . Difficulty of Paying Living Expenses: Not on file  Food Insecurity:   . Worried About Charity fundraiser in the Last Year: Not on file  . Ran Out of Food in the Last Year: Not on file  Transportation Needs:   . Lack of Transportation (Medical): Not on file  . Lack of Transportation (Non-Medical): Not on file  Physical Activity:   . Days of Exercise per Week: Not on file  . Minutes of Exercise per Session: Not on file  Stress:   . Feeling of Stress : Not on file  Social Connections:   . Frequency of Communication with Friends and Family: Not on file  . Frequency of Social Gatherings with Friends and Family: Not on file  . Attends Religious Services: Not on file  . Active Member of Clubs or Organizations: Not on file  . Attends Archivist Meetings: Not on file  . Marital Status: Not on file  Intimate Partner Violence:   . Fear of Current or Ex-Partner: Not on file  . Emotionally Abused: Not on file  . Physically Abused: Not on file  . Sexually Abused:  Not on file   Family History  Problem Relation Age of Onset  . Breast cancer Sister   . Colon cancer Neg Hx     OBJECTIVE:  Vitals:   12/06/19 1537  BP: 113/64  Pulse: 74  Resp: 18  Temp: 98.7 F (37.1 C)  TempSrc: Oral  SpO2: 96%     Physical Exam Vitals and nursing note reviewed.  Constitutional:      General: He is not in acute distress.    Appearance: Normal appearance. He is normal weight. He is not ill-appearing, toxic-appearing or diaphoretic.  Cardiovascular:     Rate and Rhythm: Normal rate and regular rhythm.     Pulses: Normal pulses.     Heart sounds: Normal heart sounds. No murmur heard.  No friction rub. No gallop.   Pulmonary:     Effort: Pulmonary effort is normal. No respiratory distress.     Breath sounds: Normal breath sounds. No stridor. No wheezing, rhonchi or rales.  Chest:     Chest wall: No tenderness.  Musculoskeletal:     Right elbow: Normal.     Left elbow: Normal.     Comments: Mass present at the elbow olecranon.  Measuring 6 x 4 cm   Skin:    General: Skin is warm.     Findings: Rash present. Rash is scaling.     Comments: Scaling rash at the philtrum (white in color and peeling off)  Neurological:     Mental Status: He is alert and oriented to person, place, and time.     LABS:  No results found for this or any previous visit (from the past 24 hour(s)).   ASSESSMENT & PLAN:  1. Dry skin dermatitis   2. Mass of  joint of elbow     Meds ordered this encounter  Medications  . hydrocortisone cream 1 %    Sig: Apply to affected area 2 times daily    Dispense:  15 g    Refill:  0    Discharge Instructions Mix hydrocortisone cream with facial lotion and apply twice daily Follow-up with PCP for further evaluation Return or go to ED for worsening of symptoms  Reviewed expectations re: course of current medical issues. Questions answered. Outlined signs and symptoms indicating need for more acute intervention. Patient  verbalized understanding. After Visit Summary given.      Note: This document was prepared using Dragon voice recognition software and may include unintentional dictation errors.    Emerson Monte, FNP 12/06/19 1606    Emerson Monte, FNP 12/06/19 1606

## 2019-12-06 NOTE — ED Triage Notes (Signed)
Pt has rash from wearing oxygen cannula and sore elbow

## 2019-12-06 NOTE — Discharge Instructions (Addendum)
Mix hydrocortisone cream with facial lotion and apply twice daily Follow-up with PCP for further evaluation Return or go to ED for worsening of symptoms

## 2019-12-31 ENCOUNTER — Other Ambulatory Visit: Payer: Self-pay | Admitting: Interventional Radiology

## 2020-01-04 ENCOUNTER — Other Ambulatory Visit: Payer: Self-pay

## 2020-01-04 ENCOUNTER — Ambulatory Visit (INDEPENDENT_AMBULATORY_CARE_PROVIDER_SITE_OTHER): Payer: BC Managed Care – PPO | Admitting: Internal Medicine

## 2020-01-04 ENCOUNTER — Encounter (INDEPENDENT_AMBULATORY_CARE_PROVIDER_SITE_OTHER): Payer: Self-pay | Admitting: Internal Medicine

## 2020-01-04 VITALS — BP 124/73 | HR 89 | Temp 96.8°F | Ht 69.0 in | Wt 136.6 lb

## 2020-01-04 DIAGNOSIS — C220 Liver cell carcinoma: Secondary | ICD-10-CM

## 2020-01-04 DIAGNOSIS — Z860100 Personal history of colon polyps, unspecified: Secondary | ICD-10-CM

## 2020-01-04 DIAGNOSIS — K7460 Unspecified cirrhosis of liver: Secondary | ICD-10-CM

## 2020-01-04 DIAGNOSIS — B182 Chronic viral hepatitis C: Secondary | ICD-10-CM | POA: Diagnosis not present

## 2020-01-04 DIAGNOSIS — K219 Gastro-esophageal reflux disease without esophagitis: Secondary | ICD-10-CM | POA: Diagnosis not present

## 2020-01-04 MED ORDER — ESOMEPRAZOLE MAGNESIUM 40 MG PO CPDR: 40.0000 mg | DELAYED_RELEASE_CAPSULE | Freq: Every day | ORAL | 11 refills | Status: DC

## 2020-01-04 NOTE — Progress Notes (Signed)
Presenting complaint;  Follow-up for GERD cirrhosis and HCC.  Database and subjective:  Patient is 69 year old Caucasian male who has chronic GERD as well as history of cirrhosis secondary to hepatitis C which was successfully treated last year.  He was also found to have [facility] for which she underwent thermal ablation in therapy last year.  On follow-up MRIs there has been no evidence of recurrent disease although his alpha-fetoprotein has been mildly elevated.  He was last seen in March.  He was supposed to have alpha-fetoprotein in June but did not go to the lab as recommended.  Patient says he has too many doctor visits and forgot about it. He states he is doing well.  He feels heartburn is well controlled with Nexium.  He needs a refill.  He denies dysphagia nausea or vomiting.  His appetite is good.  Bowels move daily.  He denies melena or rectal bleeding. He has lost 2 pounds since his last visit 6 months ago. He is still smoking cigarettes.  He smokes 1 pack/day. He says he has not had any alcohol in 1 month. He uses nasal O2 2 L while he is resting and 4 L when he is walking.  Current Medications: Outpatient Encounter Medications as of 01/04/2020  Medication Sig  . albuterol (PROAIR HFA) 108 (90 Base) MCG/ACT inhaler Inhale 2 puffs into the lungs every 6 (six) hours as needed for wheezing.   Marland Kitchen albuterol (PROVENTIL) (2.5 MG/3ML) 0.083% nebulizer solution 1 vial in neb 4 x daily as needed  . ALPRAZolam (XANAX) 0.25 MG tablet Take 0.25 mg by mouth at bedtime.  Marland Kitchen aspirin EC 81 MG tablet Take 81 mg by mouth daily. Take with brilanta  . bisoprolol (ZEBETA) 5 MG tablet TAKE 1/2 TABLET BY MOUTH EVERY DAY  . Budeson-Glycopyrrol-Formoterol (BREZTRI AEROSPHERE) 160-9-4.8 MCG/ACT AERO Inhale 2 puffs into the lungs 2 (two) times daily.  . Budeson-Glycopyrrol-Formoterol (BREZTRI AEROSPHERE) 160-9-4.8 MCG/ACT AERO Inhale 2 puffs into the lungs 2 (two) times daily.  . famotidine (PEPCID) 20 MG tablet  Take 20 mg by mouth daily as needed for heartburn or indigestion.  . Fluticasone-Umeclidin-Vilant (TRELEGY ELLIPTA) 100-62.5-25 MCG/INH AEPB INHALE 1 PUFF INTO THE LUNGS EVERY DAY  . hydrocortisone cream 1 % Apply to affected area 2 times daily  . melatonin 5 MG TABS Take 5 mg by mouth at bedtime as needed.  . montelukast (SINGULAIR) 10 MG tablet One at bedtime every night  . Multiple Vitamins-Minerals (MULTIVITAMIN WITH MINERALS) tablet Take 1 tablet by mouth daily.  . nitroGLYCERIN (NITROSTAT) 0.4 MG SL tablet PLACE 1 TABLET UNDER THE TONGUE EVERY 5 MINUTES AS NEEDED. FOLLOW MD DIRECTIONS FOR WHEN TO CALL 911  . Omega-3 Fatty Acids (FISH OIL PO) Take 1 capsule by mouth daily.  . OXYGEN Inhale into the lungs as needed. 4 Liters supplemental o2 , as needed "when im up moving around"  . oxymetazoline (AFRIN) 0.05 % nasal spray Place 1 spray into both nostrils 2 (two) times daily.  . pantoprazole (PROTONIX) 40 MG tablet Take 40 mg by mouth daily.   . predniSONE (DELTASONE) 10 MG tablet Take  4 each am x 2 days,   2 each am x 2 days,  1 each am x 2 days and stop  . tamsulosin (FLOMAX) 0.4 MG CAPS capsule Take 0.4 mg by mouth daily.  Marland Kitchen atorvastatin (LIPITOR) 20 MG tablet Take 1 tablet (20 mg total) by mouth daily.  Marland Kitchen ezetimibe (ZETIA) 10 MG tablet Take 1 tablet (10 mg  total) by mouth daily.   No facility-administered encounter medications on file as of 01/04/2020.     Objective: Blood pressure 124/73, pulse 89, temperature (!) 96.8 F (36 C), temperature source Temporal, height 5' 9"  (1.753 m), weight 136 lb 9.6 oz (62 kg). Patient is alert and in no acute distress. He is wearing a mask. He is on nasal O2. Conjunctiva is pink. Sclera is nonicteric Oropharyngeal mucosa is normal. No neck masses or thyromegaly noted. Cardiac exam with regular rhythm normal S1 and S2. No murmur or gallop noted. Auscultation of lungs reveal few scattered rhonchi and diminished intensity of breath sounds  bilaterally.. Abdomen is flat soft and nontender with no organomegaly or masses. No LE edema or clubbing noted.  Labs/studies Results:  CBC Latest Ref Rng & Units 12/15/2018 06/22/2018 06/11/2018  WBC 3.8 - 10.8 Thousand/uL 9.6 10.7 12.0(H)  Hemoglobin 13.2 - 17.1 g/dL 14.9 13.6 11.3(L)  Hematocrit 38 - 50 % 44.5 38.8 35.3(L)  Platelets 140 - 400 Thousand/uL 305 357 246    CMP Latest Ref Rng & Units 08/03/2019 01/21/2019 12/15/2018  Glucose 70 - 99 mg/dL - - -  BUN 8 - 23 mg/dL - - -  Creatinine 0.61 - 1.24 mg/dL 0.80 0.80 -  Sodium 135 - 145 mmol/L - - -  Potassium 3.5 - 5.1 mmol/L - - -  Chloride 98 - 111 mmol/L - - -  CO2 22 - 32 mmol/L - - -  Calcium 8.9 - 10.3 mg/dL - - -  Total Protein 6.1 - 8.1 g/dL - - 7.2  Total Bilirubin 0.2 - 1.2 mg/dL - - 0.7  Alkaline Phos 38 - 126 U/L - - -  AST 10 - 35 U/L - - 30  ALT 9 - 46 U/L - - 26    Hepatic Function Latest Ref Rng & Units 12/15/2018 06/22/2018 06/11/2018  Total Protein 6.1 - 8.1 g/dL 7.2 7.2 5.9(L)  Albumin 3.5 - 5.0 g/dL - - 3.1(L)  AST 10 - 35 U/L 30 34 144(H)  ALT 9 - 46 U/L 26 39 121(H)  Alk Phosphatase 38 - 126 U/L - - 71  Total Bilirubin 0.2 - 1.2 mg/dL 0.7 0.7 0.8  Bilirubin, Direct 0.0 - 0.2 mg/dL 0.2 0.1 -     Assessment:  #1.  Chronic GERD.  He is doing well with Nexium/esomeprazole.  Medication will be refilled.  #2.  Cirrhosis secondary to hepatitis C for which she was treated with [place] over 18 months ago with SVR.  #3.  History of hepatocellular carcinoma.  [place] was diagnosed when he was seen for hepatitis C and cirrhosis.  He had thermal ablation by Dr. Aletta Edouard in February last year.  He has mildly elevated alpha-fetoprotein but much less than pretreatment levels.  He was supposed to have blood work in June but did not do so.  Last MRI was in April 2021 and did not show any worrisome lesions.  #4.  History of colonic adenomas.  His last colonoscopy was in 2014 and he is overdue for 1.  Plan:  New  prescription given for Nexium/Esomeprazole 40 mg p.o. every morning 30 with 11 refills. Patient will go to the lab for CBC, LFTs and alpha-fetoprotein. Once again patient counseled for the need to quit cigarette smoking.  He needs to review all options with his primary care physician. Office visit in 6 months.

## 2020-01-04 NOTE — Patient Instructions (Signed)
Physician will call with results of blood test when completed Take Nexium/esomeprazole 40 mg by mouth 30 minutes before breakfast daily. Will consider colonoscopy when pandemic over.

## 2020-01-05 ENCOUNTER — Encounter (INDEPENDENT_AMBULATORY_CARE_PROVIDER_SITE_OTHER): Payer: Self-pay

## 2020-01-05 LAB — CBC
HCT: 42.5 % (ref 38.5–50.0)
Hemoglobin: 14.1 g/dL (ref 13.2–17.1)
MCH: 34.3 pg — ABNORMAL HIGH (ref 27.0–33.0)
MCHC: 33.2 g/dL (ref 32.0–36.0)
MCV: 103.4 fL — ABNORMAL HIGH (ref 80.0–100.0)
MPV: 10.8 fL (ref 7.5–12.5)
Platelets: 299 10*3/uL (ref 140–400)
RBC: 4.11 10*6/uL — ABNORMAL LOW (ref 4.20–5.80)
RDW: 10.9 % — ABNORMAL LOW (ref 11.0–15.0)
WBC: 10.9 10*3/uL — ABNORMAL HIGH (ref 3.8–10.8)

## 2020-01-05 LAB — COMPREHENSIVE METABOLIC PANEL
AG Ratio: 1.6 (calc) (ref 1.0–2.5)
ALT: 41 U/L (ref 9–46)
AST: 34 U/L (ref 10–35)
Albumin: 4.2 g/dL (ref 3.6–5.1)
Alkaline phosphatase (APISO): 109 U/L (ref 35–144)
BUN/Creatinine Ratio: 26 (calc) — ABNORMAL HIGH (ref 6–22)
BUN: 15 mg/dL (ref 7–25)
CO2: 37 mmol/L — ABNORMAL HIGH (ref 20–32)
Calcium: 9.3 mg/dL (ref 8.6–10.3)
Chloride: 96 mmol/L — ABNORMAL LOW (ref 98–110)
Creat: 0.58 mg/dL — ABNORMAL LOW (ref 0.70–1.25)
Globulin: 2.7 g/dL (calc) (ref 1.9–3.7)
Glucose, Bld: 115 mg/dL — ABNORMAL HIGH (ref 65–99)
Potassium: 4.1 mmol/L (ref 3.5–5.3)
Sodium: 138 mmol/L (ref 135–146)
Total Bilirubin: 0.6 mg/dL (ref 0.2–1.2)
Total Protein: 6.9 g/dL (ref 6.1–8.1)

## 2020-01-05 LAB — AFP TUMOR MARKER: AFP-Tumor Marker: 39.6 ng/mL — ABNORMAL HIGH (ref <6.1)

## 2020-01-06 ENCOUNTER — Telehealth (INDEPENDENT_AMBULATORY_CARE_PROVIDER_SITE_OTHER): Payer: Self-pay

## 2020-01-06 DIAGNOSIS — R772 Abnormality of alphafetoprotein: Secondary | ICD-10-CM

## 2020-01-06 NOTE — Telephone Encounter (Signed)
LeighAnn Eren [place], CMA  

## 2020-01-07 ENCOUNTER — Encounter (INDEPENDENT_AMBULATORY_CARE_PROVIDER_SITE_OTHER): Payer: Self-pay

## 2020-01-07 ENCOUNTER — Telehealth (INDEPENDENT_AMBULATORY_CARE_PROVIDER_SITE_OTHER): Payer: Self-pay | Admitting: *Deleted

## 2020-01-07 NOTE — Telephone Encounter (Signed)
Please call patient [phone]) he's looked at blood work on my chart and has questions about is glucose level

## 2020-01-08 ENCOUNTER — Other Ambulatory Visit: Payer: Self-pay | Admitting: Cardiology

## 2020-01-10 ENCOUNTER — Other Ambulatory Visit: Payer: Self-pay | Admitting: Cardiology

## 2020-01-15 NOTE — Telephone Encounter (Signed)
Patient's call returned. This glucose is random therefore not helpful.   He will have fasting blood glucose and hemoglobin A1c in December 2021 when he has alpha-fetoprotein.

## 2020-01-17 ENCOUNTER — Other Ambulatory Visit (INDEPENDENT_AMBULATORY_CARE_PROVIDER_SITE_OTHER): Payer: Self-pay | Admitting: *Deleted

## 2020-01-17 DIAGNOSIS — R772 Abnormality of alphafetoprotein: Secondary | ICD-10-CM

## 2020-01-17 DIAGNOSIS — R7309 Other abnormal glucose: Secondary | ICD-10-CM

## 2020-01-17 DIAGNOSIS — C220 Liver cell carcinoma: Secondary | ICD-10-CM

## 2020-01-17 NOTE — Telephone Encounter (Signed)
All labs have been ordered for December 2021.

## 2020-01-18 ENCOUNTER — Encounter (INDEPENDENT_AMBULATORY_CARE_PROVIDER_SITE_OTHER): Payer: Self-pay

## 2020-01-18 ENCOUNTER — Other Ambulatory Visit: Payer: Self-pay | Admitting: Interventional Radiology

## 2020-01-18 DIAGNOSIS — C220 Liver cell carcinoma: Secondary | ICD-10-CM

## 2020-01-19 ENCOUNTER — Other Ambulatory Visit: Payer: Self-pay

## 2020-01-19 ENCOUNTER — Ambulatory Visit (HOSPITAL_COMMUNITY)
Admission: RE | Admit: 2020-01-19 | Discharge: 2020-01-19 | Disposition: A | Discharge status: Home / Self Care | Payer: Medicare Other | Source: Ambulatory Visit | Attending: Internal Medicine | Admitting: Internal Medicine

## 2020-01-19 ENCOUNTER — Encounter (INDEPENDENT_AMBULATORY_CARE_PROVIDER_SITE_OTHER): Payer: Self-pay

## 2020-01-19 DIAGNOSIS — R772 Abnormality of alphafetoprotein: Secondary | ICD-10-CM | POA: Insufficient documentation

## 2020-01-19 MED ORDER — GADOBUTROL 1 MMOL/ML IV SOLN
7.0000 mL | Freq: Once | INTRAVENOUS | Status: AC | PRN
  Administered 2020-01-19: 7 mL via INTRAVENOUS

## 2020-01-21 ENCOUNTER — Telehealth: Payer: Self-pay | Admitting: *Deleted

## 2020-01-21 NOTE — Telephone Encounter (Signed)
CALLED PATIENT TO INFORM OF CT FOR 01-28-20 - ARRIVAL TIME- 6:30 PM @ [facility], NO RESTRICITIONS TO TEST, PATIENT TO FU WITH DR. Isidore Moos ON 02-01-20 @ 2:20 PM FOR RESULTS, SPOKE WITH PATIENT AND HE IS AWARE OF THESE APPTS.

## 2020-01-25 ENCOUNTER — Other Ambulatory Visit: Payer: Self-pay | Admitting: Radiation Oncology

## 2020-01-25 ENCOUNTER — Other Ambulatory Visit: Payer: Self-pay

## 2020-01-25 ENCOUNTER — Encounter: Payer: Self-pay | Admitting: *Deleted

## 2020-01-25 ENCOUNTER — Ambulatory Visit
Admission: RE | Admit: 2020-01-25 | Discharge: 2020-01-25 | Disposition: A | Discharge status: Home / Self Care | Payer: Medicare Other | Source: Ambulatory Visit | Attending: Interventional Radiology | Admitting: Interventional Radiology

## 2020-01-25 DIAGNOSIS — C3432 Malignant neoplasm of lower lobe, left bronchus or lung: Secondary | ICD-10-CM

## 2020-01-25 DIAGNOSIS — C220 Liver cell carcinoma: Secondary | ICD-10-CM

## 2020-01-25 HISTORY — PX: IR RADIOLOGIST EVAL & MGMT: IMG5224

## 2020-01-25 NOTE — Progress Notes (Signed)
Chief Complaint: Patient was consulted remotely today (TeleHealth) for follow up after ablation of a hepatocellular carcinoma.  History of Present Illness: Adam Bass is a 69 y.o. male with a history of cirrhosis, hepatitis C and status post percutaneous thermal ablation of a 2.5 cm hepatocellular carcinoma of the left lobe of the liver on 06/10/2018 .Initial MRI on 10/02/2018 demonstrating a well-circumscribed ablation defect in segment 4A without enhancement. There is suggestion of slight enlargement of a superior right hepatic enhancing lesion in segment 8 and twosmaller enhancing lesions in left and right lobes. AFP level came down significantly after ablation from 164 to 17.  A follow-up MRI on 08/03/2019 demonstrates no evidence of tumor recurrence in segment IV and a roughly 10 mm area of arterial enhancement in segment VIII.  The study was significantly impaired by respiratory motion.  Since that time AFP has risen to 39.6 on 01/04/2020. A follow-up MRI of the abdomen was performed on 01/19/2020.  Adam Bass states that recently his COPD has been significantly worse and he is now on continuous 4 L of oxygen by nasal cannula 24 hours a day.      Past Medical History:  Diagnosis Date   Anticoagulant long-term use    brillinta   Bilateral lower extremity edema    feet, noticed since discharge from hospital admission 05-18-2018, "i was laid up in the bed for five days"    CAD (coronary artery disease) cardiologist-- dr j. branch   hx STEMI 11-23-2017,  s/p  cardiac cath with Thrombectomy, PCI and DES x1 to midRCA, and other nonobstrucitve CAD involving pLAD, OM1, dRCA, posterior atrio, LVEF 45-50%,  LVEDP 75mmHg   COPD (chronic obstructive pulmonary disease) with emphysema [facility])    pulmonology-- dr wert-- GOLD III mix (pt still smokes),  last exacerbation -due to the flu , discharged 05-18-2018 at [place] (dyspnea on exertion)    Ectatic abdominal aorta [facility])     MRI 11/ 2019   Hepatitis C GI-- dr Laural Golden   dx 02-02-2018--- currently treated on antiviral medication   Hepatocellular carcinoma ([place])    History of radiation therapy 06/03/17- 06/09/17   Left Lung treated to 54 Gy with 3 fx of 18 Gy. SBRT   History of skin cancer    04/ 2017 excision from face   History of ST elevation myocardial infarction (STEMI) 11/23/2017   inferior wall-- s/p cardiac cath w/ thrombectomy, PCI, and DES   HLD (hyperlipidemia)    Hypertension    Primary squamous cell carcinoma of left lung [facility]) oncologist-- dr Isidore Moos   dx 02/ 2018-- Stage IA2 Left lower lung,  s/p  Stereotactic radiation completed 06-09-2017   Productive cough    per pt mostly clear   Requires supplemental oxygen    06-09-2018 currently pt is using a portable O2 as needed and at night   S/P drug eluting coronary stent placement 11/23/2017   DES x1 to [facility]   Urinary incontinence    wear depends   Wears dentures    fuller upper,  lower partial   Wears glasses     Past Surgical History:  Procedure Laterality Date   COLONOSCOPY N/A 08/27/2012   Procedure: COLONOSCOPY;  Surgeon: Rogene Houston, MD;  Location[facility];  Service: Endoscopy;  Laterality: N/A;  1200   CORONARY STENT INTERVENTION N/A 11/23/2017   Procedure: CORONARY STENT INTERVENTION;  Surgeon: Jettie Booze, MD;  Location: [place] CV LAB;  Service: Cardiovascular;  Laterality: N/A;   IR RADIOLOGIST EVAL & MGMT  03/04/2018   IR RADIOLOGIST EVAL & MGMT  10/06/2018   IR RADIOLOGIST EVAL & MGMT  01/26/2019   IR RADIOLOGIST EVAL & MGMT  08/11/2019   LEFT HEART CATH AND CORONARY ANGIOGRAPHY N/A 11/23/2017   Procedure: LEFT HEART CATH AND CORONARY ANGIOGRAPHY;  Surgeon: Jettie Booze, MD;  Location: [place] CV LAB;  Service: Cardiovascular;  Laterality: N/A;   RADIOFREQUENCY ABLATION N/A 06/10/2018   Procedure: MICROWAVE THERMAL ABLATION LIVER;  Surgeon: Aletta Edouard, MD;  Location: [facility]  ORS;  Service: Anesthesiology;  Laterality: N/A;    Allergies: Bee venom  Medications: Prior to Admission medications   Medication Sig Start Date End Date Taking? Authorizing Provider  albuterol (PROAIR HFA) 108 (90 Base) MCG/ACT inhaler Inhale 2 puffs into the lungs every 6 (six) hours as needed for wheezing.     [provider]  albuterol (PROVENTIL) (2.5 MG/3ML) 0.083% nebulizer solution 1 vial in neb 4 x daily as needed 08/06/19   [provider]  ALPRAZolam Duanne Moron) 0.25 MG tablet Take 0.25 mg by mouth at bedtime. 11/12/19   [provider]  aspirin EC 81 MG tablet Take 81 mg by mouth daily. Take with brilanta    [provider]  atorvastatin (LIPITOR) 20 MG tablet Take 1 tablet (20 mg total) by mouth daily. 02/26/19 10/15/19  Arnoldo Lenis, MD  bisoprolol (ZEBETA) 5 MG tablet TAKE 1/2 TABLET BY MOUTH EVERY DAY 10/19/19   Arnoldo Lenis, MD  Budeson-Glycopyrrol-Formoterol (BREZTRI AEROSPHERE) 160-9-4.8 MCG/ACT AERO Inhale 2 puffs into the lungs 2 (two) times daily. 10/15/19   Tanda Rockers, MD  esomeprazole (NEXIUM) 40 MG capsule Take 1 capsule (40 mg total) by mouth daily before breakfast. 01/04/20   Rehman, Mechele Dawley, MD  ezetimibe (ZETIA) 10 MG tablet Take 1 tablet (10 mg total) by mouth daily. 11/27/17 07/27/19  Cheryln Manly, NP  famotidine (PEPCID) 20 MG tablet Take 20 mg by mouth daily as needed for heartburn or indigestion.    [provider]  Fluticasone-Umeclidin-Vilant (TRELEGY ELLIPTA) 100-62.5-25 MCG/INH AEPB INHALE 1 PUFF INTO THE LUNGS EVERY DAY 08/28/19   Wurst, [place], PA-C  hydrocortisone cream 1 % Apply to affected area 2 times daily 12/06/19   Avegno, Darrelyn Hillock, FNP  melatonin 5 MG TABS Take 5 mg by mouth at bedtime as needed.    [provider]  montelukast (SINGULAIR) 10 MG tablet One at bedtime every night 10/15/19   Tanda Rockers, MD  Multiple Vitamins-Minerals (MULTIVITAMIN WITH MINERALS) tablet Take 1  tablet by mouth daily.    [provider]  nitroGLYCERIN (NITROSTAT) 0.4 MG SL tablet PLACE 1 TABLET UNDER THE TONGUE EVERY 5 MINUTES AS NEEDED. FOLLOW MD DIRECTIONS FOR WHEN TO CALL 911 01/10/20   Arnoldo Lenis, MD  Omega-3 Fatty Acids (FISH OIL PO) Take 1 capsule by mouth daily.    [provider]  OXYGEN Inhale into the lungs as needed. 4 Liters supplemental o2 , as needed "when im up moving around"    [provider]  oxymetazoline (AFRIN) 0.05 % nasal spray Place 1 spray into both nostrils 2 (two) times daily.    [provider]  tamsulosin (FLOMAX) 0.4 MG CAPS capsule Take 0.4 mg by mouth daily. 09/20/19   [provider]     Family History  Problem Relation Age of Onset   Breast cancer Sister    Colon cancer Neg  Hx     Social History   Socioeconomic History   Marital status: Divorced    Spouse name: Not on file   Number of children: Not on file   Years of education: Not on file   Highest education level: Not on file  Occupational History   Not on file  Tobacco Use   Smoking status: Current Every Day Smoker    Packs/day: 0.75    Years: 50.00    Pack years: 37.50    Types: Cigars   Smokeless tobacco: Never Used   Tobacco comment: . He is smoking about 5-10 cigarettes daily.   Vaping Use   Vaping Use: Former   Quit date: 10/07/2017  Substance and Sexual Activity   Alcohol use: Yes    Alcohol/week: 8.0 standard drinks    Types: 8 Cans of beer per week    Comment: 8 beers weekly, give or take    Drug use: No   Sexual activity: Not on file  Other Topics Concern   Not on file  Social History Narrative   Not on file   Social Determinants of Health   Financial Resource Strain:    Difficulty of Paying Living Expenses: Not on file  Food Insecurity:    Worried About [place] in the Last Year: Not on file   Ran Out of Food in the Last Year: Not on file  Transportation Needs:    Lack of  Transportation (Medical): Not on file   Lack of Transportation (Non-Medical): Not on file  Physical Activity:    Days of Exercise per Week: Not on file   Minutes of Exercise per Session: Not on file  Stress:    Feeling of Stress : Not on file  Social Connections:    Frequency of Communication with Friends and Family: Not on file   Frequency of Social Gatherings with Friends and Family: Not on file   Attends Religious Services: Not on file   Active Member of Clubs or Organizations: Not on file   Attends Archivist Meetings: Not on file   Marital Status: Not on file    ECOG Status: 2 - Symptomatic, <50% confined to bed  Review of Systems  Review of Systems: A 12 point ROS discussed and pertinent positives are indicated in the HPI above.  All other systems are negative.  Physical Exam No direct physical exam was performed (except for noted visual exam findings with Video Visits).   Vital Signs: There were no vitals taken for this visit.  Imaging: MR LIVER W WO CONTRAST  Result Date: 01/19/2020 CLINICAL DATA:  Hepatocellular carcinoma treated with thermal ablation. Elevated alpha fetoprotein. Restaging assessment. EXAM: MRI ABDOMEN WITHOUT AND WITH CONTRAST TECHNIQUE: Multiplanar multisequence MR imaging of the abdomen was performed both before and after the administration of intravenous contrast. CONTRAST:  87mL GADAVIST GADOBUTROL 1 MMOL/ML IV SOLN COMPARISON:  Multiple exams, including 08/03/2019 FINDINGS: Despite efforts by the technologist and patient, motion artifact is present on today's exam and could not be eliminated. This reduces exam sensitivity and specificity. Lower chest: New or progressive 3.0 by 2.1 cm left lower lobe mass on image 22 of series 3 with adjacent 2.3 by 1.3 cm left lower lobe nodule on image 21 of series 3. Appearance is highly concerning for malignancy and chest CT with contrast is recommended for better characterization. Hepatobiliary:  Centrally in the right hepatic lobe, a 1.7 by 1.3 cm arterial phase enhancing nodule demonstrates washout and capsule  appearance. This lesion was previously LI-RADS category LR-3 and previously measured 1.1 cm in diameter, and is currently considered LI-RADS category LR-5 (definite hepatocellular carcinoma). Current lesion size formerly qualifies as interval growth. Several other LI-RADS category LR 3 lesions are present. In segment 4a of the liver, a 1.1 by 0.7 cm arterial phase enhancing lesion is present without capsule or washout appearance. In the lateral segment left hepatic lobe, a 0.9 by 0.7 cm arterial phase enhancing nodule is present on image 39 of series 14 without washout or capsule appearance. Several additional tiny peripheral foci of arterial phase enhancement are observed in the left hepatic lobe and right hepatic lobe and likewise are potential LR 3 lesions. Prior ablation site in segment 4 adjacent to the falciform ligament, without local recurrence along the ablation site. Pancreas:  Unremarkable Spleen:  Unremarkable Adrenals/Urinary Tract: Adrenal glands normal. Multiple Bosniak category 1 and category 2 cysts are present. No worrisome renal lesion enhancement. Stomach/Bowel: Descending colon diverticulosis. Vascular/Lymphatic: Aortoiliac atherosclerotic vascular disease. Infrarenal abdominal aorta measures 2.7 cm in diameter. No pathologic adenopathy identified. Other:  No supplemental non-categorized findings. Musculoskeletal: Dextroconvex lumbar scoliosis with lumbar spondylosis and degenerative disc disease. IMPRESSION: 1. New or progressive 3.0 by 2.1 cm left lower lobe lung mass with adjacent 2.3 by 1.3 cm left lower lobe nodule. Appearance is highly concerning for malignancy and chest CT with contrast is recommended for better characterization. 2. LI-RADS category LR-5 (definite hepatocellular carcinoma) central in the right hepatic lobe. 3. Several LI-RADS category LR 3 lesions are  present scattered in the liver. 4. Prior ablation site in segment 4 adjacent to the falciform ligament. 5. Descending colon diverticulosis. 6. Dextroconvex lumbar scoliosis with lumbar spondylosis and degenerative disc disease. 7.  Aortic Atherosclerosis (ICD10-I70.0). Electronically Signed   By: Van Clines M.D.   On: 01/19/2020 13:53    Labs:  CBC: Recent Labs    01/04/20 1139  WBC 10.9*  HGB 14.1  HCT 42.5  PLT 299    COAGS: No results for input(s): INR, APTT in the last 8760 hours.  BMP: Recent Labs    08/03/19 1140 01/04/20 1139  NA  --  138  K  --  4.1  CL  --  96*  CO2  --  37*  GLUCOSE  --  115*  BUN  --  15  CALCIUM  --  9.3  CREATININE 0.80 0.58*    LIVER FUNCTION TESTS: Recent Labs    01/04/20 1139  BILITOT 0.6  AST 34  ALT 41  PROT 6.9    TUMOR MARKERS: Recent Labs    01/04/20 1139  AFPTM 39.6*    Assessment and Plan:  I reviewed imaging findings with Adam Bass by phone from the MRI of the abdomen dated 01/19/2020.  This demonstrates a stable ablation site with no evidence of recurrent enhancing tumor in segment IV adjacent to the falciform ligament.  The area of central arterial enhancement within segment VIII of the right lobe demonstrates enlargement and now measures approximately 1.7 x 1.3 cm and is suspicious for hepatocellular carcinoma and now categorized as an LR-5 lesion.  Several small LR 3 lesions are identified in the left and right lobes.  Incidentally noted was a progressive left lower lobe lung mass by MRI measuring up to potentially 3 cm and an adjacent more medial satellite nodule of up to 2.5 cm.  Given history of prior treated squamous carcinoma of the left lower lobe by SBRT, are suspicious for recurrent carcinoma  given that this area did not show significant nodularity by CT in February.  Dr. Laural Golden has also reviewed imaging findings with Adam Bass.  Dr. Isidore Moos has ordered a follow-up CT of the chest which is scheduled  to be performed on Friday.  This has been scheduled without contrast but we are determining if it can be performed with contrast in order to increase sensitivity of detecting potential enlarged lymph nodes.  I recommended that Adam Bass initially pursue work-up of potential recurrent lung carcinoma before we decide how to proceed with findings in the liver.  He may require either additional radiation therapy or chemotherapy for recurrent lung carcinoma.  A PET scan may also be helpful for further staging evaluation.  With regard to the liver, the more suspicious 1.7 cm area of arterial enhancement was reviewed.  This likely measures more in the range of 1.5 cm.  Although this is certainly within range of being treatable with a second thermal ablation procedure, I am concerned about placing Adam Bass under general anesthesia given his clinical progression of COPD and increased continuous oxygen requirement.  In the future, should he show some disease progression in the liver, we may have to consider liver directed treatment with radioembolization rather than ablation since it can be performed with conscious sedation.  In addition, Y-90 radioembolization treatment would also potentially treat other smaller foci of [employer] in the liver.   Electronically Signed: Azzie Roup 01/25/2020, 1:49 PM     I spent a total of 15 Minutes in remote  clinical consultation, greater than 50% of which was counseling/coordinating care for hepatocellular carcinoma.    Visit type: Audio only (telephone). Audio (no video) only due to patient's lack of internet/smartphone capability. Alternative for in-person consultation at [facility], [address] [place], [place], [place]. This visit type was conducted due to national recommendations for restrictions regarding the COVID-19 Pandemic (e.g. social distancing).  This format is felt to be most appropriate for this patient at this time.  All issues noted in this  document were discussed and addressed.

## 2020-01-28 ENCOUNTER — Other Ambulatory Visit: Payer: Self-pay

## 2020-01-28 ENCOUNTER — Ambulatory Visit (HOSPITAL_COMMUNITY)
Admission: RE | Admit: 2020-01-28 | Discharge: 2020-01-28 | Disposition: A | Discharge status: Home / Self Care | Payer: Medicare Other | Source: Ambulatory Visit | Attending: Radiation Oncology | Admitting: Radiation Oncology

## 2020-01-28 DIAGNOSIS — C3432 Malignant neoplasm of lower lobe, left bronchus or lung: Secondary | ICD-10-CM | POA: Diagnosis present

## 2020-01-28 MED ORDER — IOHEXOL 300 MG/ML  SOLN
75.0000 mL | Freq: Once | INTRAMUSCULAR | Status: AC | PRN
  Administered 2020-01-28: 75 mL via INTRAVENOUS

## 2020-02-01 ENCOUNTER — Other Ambulatory Visit: Payer: Self-pay

## 2020-02-01 ENCOUNTER — Ambulatory Visit
Admission: RE | Admit: 2020-02-01 | Discharge: 2020-02-01 | Disposition: A | Discharge status: Home / Self Care | Payer: Medicare Other | Source: Ambulatory Visit | Attending: Radiation Oncology | Admitting: Radiation Oncology

## 2020-02-01 VITALS — BP 103/69 | HR 63 | Temp 97.7°F | Resp 18 | Ht 69.0 in | Wt 136.1 lb

## 2020-02-01 DIAGNOSIS — Z79899 Other long term (current) drug therapy: Secondary | ICD-10-CM | POA: Diagnosis not present

## 2020-02-01 DIAGNOSIS — M47816 Spondylosis without myelopathy or radiculopathy, lumbar region: Secondary | ICD-10-CM | POA: Insufficient documentation

## 2020-02-01 DIAGNOSIS — R772 Abnormality of alphafetoprotein: Secondary | ICD-10-CM | POA: Diagnosis not present

## 2020-02-01 DIAGNOSIS — Z7982 Long term (current) use of aspirin: Secondary | ICD-10-CM | POA: Diagnosis not present

## 2020-02-01 DIAGNOSIS — C3432 Malignant neoplasm of lower lobe, left bronchus or lung: Secondary | ICD-10-CM | POA: Diagnosis present

## 2020-02-01 DIAGNOSIS — C220 Liver cell carcinoma: Secondary | ICD-10-CM

## 2020-02-01 DIAGNOSIS — I700 Atherosclerosis of aorta: Secondary | ICD-10-CM | POA: Diagnosis not present

## 2020-02-01 DIAGNOSIS — K5730 Diverticulosis of large intestine without perforation or abscess without bleeding: Secondary | ICD-10-CM | POA: Diagnosis not present

## 2020-02-01 DIAGNOSIS — M5136 Other intervertebral disc degeneration, lumbar region: Secondary | ICD-10-CM | POA: Diagnosis not present

## 2020-02-01 NOTE — Progress Notes (Signed)
Ms. Meskill presents for follow up of SBRT completed on 06/09/2017 to his left lung, and to review CT scan results from 01/28/2020  Pain: patient denies Cough: patient reports occasional productive cough (states he doesn't look at the sputum to characterize it). He states it's worse first thing in the morning, and then lessens throughout the day. SOB: patient reports SOB with minimal acitivity. He does report that since hes started wearing the nicotine patch his SOB has improved and he is more mobile than he was. (Daughters note that they feel his mobility is still very limited, and he struggles with short distances and stairs) Appetite: reports improvement in appetite since starting his nicotine patch Wt Readings from Last 3 Encounters:  02/01/20 136 lb 2 oz (61.7 kg)  01/04/20 136 lb 9.6 oz (62 kg)  10/15/19 138 lb (62.6 kg)   Tobacco use: started nicotine patch 1 week ago today. States he is down from 1-1.5 packs a day to 2-3 cigarettes. Other issues of note: Requires at least 2-L of supplemental oxygen at all times. He is concerned about prognosis given most recent imaging.  Vitals:   02/01/20 1445  BP: 103/69  Pulse: 63  Resp: 18  Temp: 97.7 F (36.5 C)  SpO2: 93%

## 2020-02-04 ENCOUNTER — Other Ambulatory Visit: Payer: Self-pay

## 2020-02-04 DIAGNOSIS — C3432 Malignant neoplasm of lower lobe, left bronchus or lung: Secondary | ICD-10-CM

## 2020-02-04 DIAGNOSIS — C220 Liver cell carcinoma: Secondary | ICD-10-CM

## 2020-02-07 ENCOUNTER — Encounter: Payer: Self-pay | Admitting: Radiation Oncology

## 2020-02-07 NOTE — Progress Notes (Signed)
Radiation Oncology         [phone]) [phone] ________________________________  Name: FISHEL WAMBLE MRN: [id number]  Date: 02/01/2020  DOB: 04-Apr-1951  Follow-Up Visit Note in person  Outpatient  CC: [facility], [place]  Hildred Laser, MD  Diagnosis and Prior Radiotherapy:    ICD-10-CM   1. 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 Hepatocellular carcinoma (HCC)  C22.0 CANCELED: NM PET Image Restag (PS) Skull Base To Thigh    Primary cancer of left lower lobe of lung ([place]) Staging form: Lung, AJCC 8th Edition - Clinical stage from 04/30/2017: Stage IA2 (cT1b, cN0, cM0) - Signed by Eppie Gibson, MD on 04/30/2017    SBRT dates:   06/03/17 - 06/09/17 Site/dose:   left lower lung tumor  treated to 54 Gy in 3 fractions of 18 Gy, given every other day  (Biopsy of the left lower lobe nodule on 04/10/2017 which revealed non-small cell carcinoma in the left lower lobe - favoring squamous cell carcinoma.)  CHIEF COMPLAINT: Here for follow-up and surveillance of left lower lung cancer  Narrative:    Ms. Mozingo presents for follow up of SBRT completed on 06/09/2017 to his left lung, and to review CT scan results from 01/28/2020  Pain: patient denies Cough: patient reports occasional productive cough (states he doesn't look at the sputum to characterize it). He states it's worse first thing in the morning, and then lessens throughout the day. SOB: patient reports SOB with minimal acitivity. He does report that since he started wearing the nicotine patch his SOB has improved and he is more mobile than he was. (Daughters note that they feel his mobility is still very limited, and he struggles with short distances and stairs) Appetite: reports improvement in appetite since starting his nicotine patch Wt Readings from Last 3 Encounters:  02/01/20 136 lb 2 oz (61.7 kg)  01/04/20 136 lb 9.6 oz (62 kg)  10/15/19 138  lb (62.6 kg)   Tobacco use: started nicotine patch 1 week ago today. States he is down from 1-1.5 packs a day to 2-3 cigarettes. Other issues of note: Requires at least 2-L of supplemental oxygen at all times. He is concerned about prognosis given most recent imaging.    ALLERGIES:  is allergic to bee venom.  Meds: Current Outpatient Medications  Medication Sig Dispense Refill  . nicotine (NICODERM CQ - DOSED IN MG/24 HOURS) 21 mg/24hr patch Place 21 mg onto the skin daily.    Marland Kitchen albuterol (PROAIR HFA) 108 (90 Base) MCG/ACT inhaler Inhale 2 puffs into the lungs every 6 (six) hours as needed for wheezing.     Marland Kitchen albuterol (PROVENTIL) (2.5 MG/3ML) 0.083% nebulizer solution 1 vial in neb 4 x daily as needed    . ALPRAZolam (XANAX) 0.25 MG tablet Take 0.25 mg by mouth at bedtime.    Marland Kitchen aspirin EC 81 MG tablet Take 81 mg by mouth daily. Take with brilanta    . atorvastatin (LIPITOR) 20 MG tablet Take 1 tablet (20 mg total) by mouth daily. 90 tablet 3  . bisoprolol (ZEBETA) 5 MG tablet TAKE 1/2 TABLET BY MOUTH EVERY DAY 45 tablet 3  . Budeson-Glycopyrrol-Formoterol (BREZTRI AEROSPHERE) 160-9-4.8 MCG/ACT AERO Inhale 2 puffs into the lungs 2 (two) times daily. 10.7 g 11  . ezetimibe (ZETIA) 10 MG tablet Take 1 tablet (10 mg total) by mouth daily. 90 tablet 1  . Fluticasone-Umeclidin-Vilant (TRELEGY  ELLIPTA) 100-62.5-25 MCG/INH AEPB INHALE 1 PUFF INTO THE LUNGS EVERY DAY 60 each 0  . hydrocortisone cream 1 % Apply to affected area 2 times daily 15 g 0  . melatonin 5 MG TABS Take 5 mg by mouth at bedtime as needed.    . montelukast (SINGULAIR) 10 MG tablet One at bedtime every night 30 tablet 11  . Multiple Vitamins-Minerals (MULTIVITAMIN WITH MINERALS) tablet Take 1 tablet by mouth daily.    . nitroGLYCERIN (NITROSTAT) 0.4 MG SL tablet PLACE 1 TABLET UNDER THE TONGUE EVERY 5 MINUTES AS NEEDED. FOLLOW MD DIRECTIONS FOR WHEN TO CALL 911 25 tablet 3  . Omega-3 Fatty Acids (FISH OIL PO) Take 1 capsule by  mouth daily.    . OXYGEN Inhale into the lungs as needed. 4 Liters supplemental o2 , as needed "when im up moving around"    . oxymetazoline (AFRIN) 0.05 % nasal spray Place 1 spray into both nostrils 2 (two) times daily.    . pantoprazole (PROTONIX) 40 MG tablet Take 40 mg by mouth daily.    . tamsulosin (FLOMAX) 0.4 MG CAPS capsule Take 0.4 mg by mouth daily.     No current facility-administered medications for this encounter.    Physical Findings: The patient is in no acute distress. Patient is alert and oriented.  height is 5\' 9"  (1.753 m) and weight is 136 lb 2 oz (61.7 kg). His temporal temperature is 97.7 F (36.5 C). His blood pressure is 103/69 and his pulse is 63. His respiration is 18 and oxygen saturation is 93%. Marland Kitchen   Heart: RRR Chest: CTAB Sitting in a wheelchair  Lab Findings: Lab Results  Component Value Date   WBC 10.9 (H) 01/04/2020   HGB 14.1 01/04/2020   HCT 42.5 01/04/2020   MCV 103.4 (H) 01/04/2020   PLT 299 01/04/2020    Radiographic Findings: CT Chest W Contrast  Result Date: 01/29/2020 CLINICAL DATA:  Lung mass. History of lung carcinoma status post radiation therapy. EXAM: CT CHEST WITH CONTRAST TECHNIQUE: Multidetector CT imaging of the chest was performed during intravenous contrast administration. CONTRAST:  50mL OMNIPAQUE IOHEXOL 300 MG/ML  SOLN COMPARISON:  CT 05/24/2019 of the chest, MRI abdomen 01/19/2020 new FINDINGS: Cardiovascular: Coronary artery calcification and aortic atherosclerotic calcification. Mediastinum/Nodes: New LEFT hilar lymph node measures 1.8 cm. Adjacent peribronchial node measuring 1.8 cm. Lungs/Pleura: Two adjacent rounded lesions in the RIGHT lower lobe at site of small nodule on comparison CT. Larger mass lesions measures 3.2 cm (image 129/4). Adjacent nodular lesion measures 1.9 cm. Stable small LEFT upper lobe nodule measuring 6 mm (image 95/4). Within the RIGHT upper lobe thick-walled cavitary nodule measuring 2.1 cm (image  37/4) is new from prior. Peripheral nodular densities in the RIGHT lower lobe are new. For example 10 mm nodule on image 164/4. Extensive centrilobular emphysema the upper lobes. Upper Abdomen: Hepatic lesion. See comparison MRI for suspicion of hepatocellular carcinoma. Musculoskeletal: No aggressive osseous lesion. IMPRESSION: 1. New mass lesion and adjacent nodule in the LEFT lower lobe consistent with lung cancer recurrence. 2. New LEFT hilar lymph nodes consistent with nodal metastasis. 3. New thick-walled cavitary lesion in the RIGHT upper lobe is concerning for metachronous bronchogenic carcinoma versus metastasis. 4. Additional nodules in the periphery of the RIGHT lower lobe are concerning. 5. Consider FDG PET scan for further evaluation. 6. Hepatocellular carcinoma lesions in the liver described on comparison MRI Electronically Signed   By: Suzy Bouchard M.D.   On: 01/29/2020 09:55  MR LIVER W WO CONTRAST  Result Date: 01/19/2020 CLINICAL DATA:  Hepatocellular carcinoma treated with thermal ablation. Elevated alpha fetoprotein. Restaging assessment. EXAM: MRI ABDOMEN WITHOUT AND WITH CONTRAST TECHNIQUE: Multiplanar multisequence MR imaging of the abdomen was performed both before and after the administration of intravenous contrast. CONTRAST:  17mL GADAVIST GADOBUTROL 1 MMOL/ML IV SOLN COMPARISON:  Multiple exams, including 08/03/2019 FINDINGS: Despite efforts by the technologist and patient, motion artifact is present on today's exam and could not be eliminated. This reduces exam sensitivity and specificity. Lower chest: New or progressive 3.0 by 2.1 cm left lower lobe mass on image 22 of series 3 with adjacent 2.3 by 1.3 cm left lower lobe nodule on image 21 of series 3. Appearance is highly concerning for malignancy and chest CT with contrast is recommended for better characterization. Hepatobiliary: Centrally in the right hepatic lobe, a 1.7 by 1.3 cm arterial phase enhancing nodule demonstrates  washout and capsule appearance. This lesion was previously LI-RADS category LR-3 and previously measured 1.1 cm in diameter, and is currently considered LI-RADS category LR-5 (definite hepatocellular carcinoma). Current lesion size formerly qualifies as interval growth. Several other LI-RADS category LR 3 lesions are present. In segment 4a of the liver, a 1.1 by 0.7 cm arterial phase enhancing lesion is present without capsule or washout appearance. In the lateral segment left hepatic lobe, a 0.9 by 0.7 cm arterial phase enhancing nodule is present on image 39 of series 14 without washout or capsule appearance. Several additional tiny peripheral foci of arterial phase enhancement are observed in the left hepatic lobe and right hepatic lobe and likewise are potential LR 3 lesions. Prior ablation site in segment 4 adjacent to the falciform ligament, without local recurrence along the ablation site. Pancreas:  Unremarkable Spleen:  Unremarkable Adrenals/Urinary Tract: Adrenal glands normal. Multiple Bosniak category 1 and category 2 cysts are present. No worrisome renal lesion enhancement. Stomach/Bowel: Descending colon diverticulosis. Vascular/Lymphatic: Aortoiliac atherosclerotic vascular disease. Infrarenal abdominal aorta measures 2.7 cm in diameter. No pathologic adenopathy identified. Other:  No supplemental non-categorized findings. Musculoskeletal: Dextroconvex lumbar scoliosis with lumbar spondylosis and degenerative disc disease. IMPRESSION: 1. New or progressive 3.0 by 2.1 cm left lower lobe lung mass with adjacent 2.3 by 1.3 cm left lower lobe nodule. Appearance is highly concerning for malignancy and chest CT with contrast is recommended for better characterization. 2. LI-RADS category LR-5 (definite hepatocellular carcinoma) central in the right hepatic lobe. 3. Several LI-RADS category LR 3 lesions are present scattered in the liver. 4. Prior ablation site in segment 4 adjacent to the falciform  ligament. 5. Descending colon diverticulosis. 6. Dextroconvex lumbar scoliosis with lumbar spondylosis and degenerative disc disease. 7.  Aortic Atherosclerosis (ICD10-I70.0). Electronically Signed   By: Van Clines M.D.   On: 01/19/2020 13:53   IR Radiologist Eval & Mgmt  Result Date: 01/25/2020 Please refer to notes tab for details about interventional procedure. (Op Note)   Impression/Plan:  This is a very nice gentleman with a history of left lung cancer, Lower lobe; responded well to SBRT.   I personally reviewed his imaging.  He has new lung nodules/masses and enlarged lymph nodes that are concerning for cancer; given his history of hepatocellular carcinoma and primary lung cancer (biopsy of the left lower lobe nodule on 04/10/2017 which revealed non-small cell carcinoma in the left lower lobe - favoring squamous cell carcinoma), it is difficult to discern if any of his disease is metastatic versus second primaries.  I think a PET scan  would be helpful to determine which lesions are most suspicious and help with strategize for the best lesion(s) to biopsy.  Of note the patient has had complications in the past with biopsy of the chest.  He also has concerning signs of progressive hepatocellular carcinoma in the liver.  The family and patient are agreeable to proceeding with the PET and strategizing based on those results.  We will arrange PET scan as soon as possible; patient prefers to get his PET scan in [place].  I will call him with the results.  I will ask for him to be discussed at [facility] so we can determine if CT-guided biopsy versus biopsy by bronchoscopy is best.  On date of service, in total, I spent 20 minutes on this encounter. Patient was seen in person.  ___________________________________   Eppie Gibson, MD

## 2020-02-09 ENCOUNTER — Telehealth: Payer: Self-pay | Admitting: *Deleted

## 2020-02-09 NOTE — Telephone Encounter (Signed)
Called patient to inform of Pet Scan for 02-21-20 - arrival time- 7:30 pm @ [facility], patient to have water only- 6 hrs. prior to test, spoke with patient and he is aware of this test.

## 2020-02-21 ENCOUNTER — Other Ambulatory Visit: Payer: Self-pay

## 2020-02-21 ENCOUNTER — Ambulatory Visit (HOSPITAL_COMMUNITY)
Admission: RE | Admit: 2020-02-21 | Discharge: 2020-02-21 | Disposition: A | Discharge status: Home / Self Care | Payer: Medicare Other | Source: Ambulatory Visit | Attending: Radiation Oncology | Admitting: Radiation Oncology

## 2020-02-21 DIAGNOSIS — C3432 Malignant neoplasm of lower lobe, left bronchus or lung: Secondary | ICD-10-CM | POA: Diagnosis present

## 2020-02-21 DIAGNOSIS — C220 Liver cell carcinoma: Secondary | ICD-10-CM | POA: Diagnosis present

## 2020-02-21 MED ORDER — FLUDEOXYGLUCOSE F - 18 (FDG) INJECTION
7.3000 | Freq: Once | INTRAVENOUS | Status: AC | PRN
  Administered 2020-02-21: 7.3 via INTRAVENOUS

## 2020-02-24 ENCOUNTER — Other Ambulatory Visit: Payer: Self-pay | Admitting: *Deleted

## 2020-02-24 NOTE — Progress Notes (Signed)
The proposed treatment discussed in cancer conference on 02/24/20 is for discussion purpose only and is not a binding recommendation.  The patient was not physically examined nor present for their treatment options.  Therefore, final treatment plans cannot be decided.

## 2020-02-25 ENCOUNTER — Telehealth: Payer: Self-pay | Admitting: Pulmonary Disease

## 2020-02-25 ENCOUNTER — Encounter: Payer: Self-pay | Admitting: Radiation Oncology

## 2020-02-25 DIAGNOSIS — R918 Other nonspecific abnormal finding of lung field: Secondary | ICD-10-CM

## 2020-02-25 NOTE — Telephone Encounter (Signed)
PCCM:  I called and spoke with the patient as well as the patient's daughter regarding recommendations for tissue biopsy.  They are agreeable to proceed with outpatient evaluation for bronchoscopy.  I briefly discussed the risk benefits and alternatives via the phone.  They are agreeable to meet me the day of the procedure on Tuesday of next week which is my next available procedure day.  They are very appreciative of the call.  Orders placed for navigational bronchoscopy for tissue sampling of the bilateral lung lesions as well as video bronchoscopy with endobronchial ultrasound for TB NA of the left hilum.  As for the patient's super D CT request needed.  We will try to reformat the recent CT scan of the chest that was completed in October.  If we are unable to do this he will need a repeat noncontrasted CT of the chest super D format.  Please contact Claiborne Billings at [facility], [place] department for help regarding this.  Cc: Larene Beach love, Endo pulmonary coordinator.  Procedure pool, [facility] pool.  Garner Nash, DO [facility] 02/25/2020 4:12 PM

## 2020-02-25 NOTE — Progress Notes (Signed)
Spoke with patient and daughter about PET results.  I let them know that I have discussed this case with Dr. Valeta Harms who can perform bronchoscopy and biopsy next week.  The patient is enthusiastic about this option as he has concerns about pneumothorax risk from a CT-guided biopsy.  They look forward to the consultation procedure next week.  -----------------------------------  Adam Gibson, MD

## 2020-02-28 ENCOUNTER — Encounter (HOSPITAL_COMMUNITY): Payer: Self-pay | Admitting: Pulmonary Disease

## 2020-02-28 NOTE — Progress Notes (Signed)
PCP:  [facility] in [place], [place] Cardiologist:  Carlyle Dolly, MD Pulmonologist:  Christinia Gully  EKG:  DOS CXR:  06/22/19 ECHO:  11/24/17 Stress Test:  Denies Cardiac Cath:  11/23/17  Fasting Blood Sugar-  N/A Checks Blood Sugar__N/A_ times a day   ASA:  Yes, patient states that he was told to continue.  Advised him to call Dr. Juline Patch office to confirm.   Blood Thinners:  No  OSA/CPAP:  No  Oxygen:  Yes, 4L 02 24 hours per day  Covid test DOS.  Jovita Kussmaul, RN aware.  Anesthesia Review:  Yes, spoke with Jeneen Rinks, [place] regarding cardiac history and oxygen use. He will look into chart.   Patient denies shortness of breath, fever, cough, and chest pain at PAT appointment.  Patient verbalized understanding of instructions provided today at the PAT appointment.  Patient asked to review instructions at home and day of surgery.

## 2020-02-28 NOTE — Progress Notes (Signed)
Anesthesia Chart Review: Same-day work-up  Follows with cardiology for history of CAD status post inferior STEMI 11/2017 treated with thrombectomy and DES to the mid RCA.  Echo showed LVEF 55-60%.  Last seen by Dr. Harl Bowie 07/27/2019, no recent cardiac symptoms.  Recommend continue current medical therapy.  Follow-up in 1 year.  History of left lower lobe lung cancer treated with SBRT February 2019.  History of chronic respiratory failure with hypoxia on continuous O2 2-4 LPM.  History of cirrhosis secondary to hepatitis C (for which he previously been treated treatment with [place]) and hepatocellular carcinoma s/p thermal ablation therapy followed by gastroenterology.  Last seen by Dr. Laural Golden 01/04/2020.  His AFP was noted to have increased and MRI liver was ordered.  Test was done 01/19/2020 and Dr. Laural Golden commented on result, "Results have been reviewed with patient. He has 2 lung lesions in left lower lobe and multiple liver lesions which are LI-RADS 3 but one lesion is LI-RADS 5 consistent with recurrent HCC."  We will need day of surgery labs and evaluation.  CT chest 01/28/2020: IMPRESSION: 1. New mass lesion and adjacent nodule in the LEFT lower lobe consistent with lung cancer recurrence. 2. New LEFT hilar lymph nodes consistent with nodal metastasis. 3. New thick-walled cavitary lesion in the RIGHT upper lobe is concerning for metachronous bronchogenic carcinoma versus metastasis. 4. Additional nodules in the periphery of the RIGHT lower lobe are concerning. 5. Consider FDG PET scan for further evaluation. 6. Hepatocellular carcinoma lesions in the liver described on comparison MRI  TTE 11/24/2017: - Left ventricle: Cannot fully evaluate regional wall motion as  endocardium is not well seen in some views The basal nferior wall  does appear hypokinetic. OVerall LVEF is normal. The cavity size  was normal. Wall thickness was normal. Systolic function was  normal. The  estimated ejection fraction was in the range of 55%  to 60%.  - Right ventricle: The cavity size was mildly dilated.   Cath and PCI 11/23/2017:  Prox LAD lesion is 25% stenosed.  1st Mrg lesion is 25% stenosed.  Post Atrio lesion is 70% stenosed.  Dist RCA lesion is 25% stenosed.  Mid RCA lesion is 100% stenosed.  After percutaneous thrombectomy, a drug-eluting stent was successfully placed using a STENT SYNERGY DES 3X28.  Post intervention, there is a 0% residual stenosis.  The left ventricular systolic function is normal.  LV end diastolic pressure is moderately elevated. LVEDP 27 mm Hg.  The left ventricular ejection fraction is 45-50% by visual estimate.  There is no aortic valve stenosis.   Recommend uninterrupted dual antiplatelet therapy with Aspirin 81mg  daily and Ticagrelor 90mg  twice daily for a minimum of 12 months (ACS - Class I recommendation).    PFT 05/06/2017: FVC-%Pred-Pre Latest Units: % 93  FEV1-%Pred-Pre Latest Units: % 40  FEV1FVC-%Pred-Pre Latest Units: % 43  TLC % pred Latest Units: % 139  RV % pred Latest Units: % 234  DLCO unc % pred Latest Units: % 37    Wynonia Musty [facility] Short Stay Center/Anesthesiology Phone [phone] 02/28/2020 1:00 PM

## 2020-02-28 NOTE — Anesthesia Preprocedure Evaluation (Addendum)
Anesthesia Evaluation  Patient identified by MRN, date of birth, ID band Patient awake    Reviewed: Allergy & Precautions, NPO status , Patient's Chart, lab work & pertinent test results  Airway Mallampati: II  TM Distance: >3 FB Neck ROM: Full    Dental  (+) Edentulous Upper, Partial Lower   Pulmonary COPD, Current Smoker,    Pulmonary exam normal breath sounds clear to auscultation       Cardiovascular hypertension, Pt. on home beta blockers and Pt. on medications + CAD, + Past MI and + DOE  Normal cardiovascular exam Rhythm:Regular Rate:Normal  Echo 11/2017 - Left ventricle: Cannot fully evaluate regional wall motion as endocardium is not well seen in some views The basal nferior wall does appear hypokinetic. OVerall LVEF is normal. The cavity size was normal. Wall thickness was normal. Systolic function was normal. The estimated ejection fraction was in the range of 55% to 60%.  - Right ventricle: The cavity size was mildly dilated.    Neuro/Psych PSYCHIATRIC DISORDERS negative neurological ROS     GI/Hepatic GERD  ,(+) Hepatitis -  Endo/Other  negative endocrine ROS  Renal/GU negative Renal ROS     Musculoskeletal negative musculoskeletal ROS (+)   Abdominal   Peds  Hematology negative hematology ROS (+)   Anesthesia Other Findings   Reproductive/Obstetrics                          Anesthesia Physical Anesthesia Plan  ASA: III  Anesthesia Plan: General   Post-op Pain Management:    Induction: Intravenous  PONV Risk Score and Plan: 2 and Treatment may vary due to age or medical condition, Ondansetron and Dexamethasone  Airway Management Planned: Oral ETT  Additional Equipment: None  Intra-op Plan:   Post-operative Plan: Extubation in OR  Informed Consent: I have reviewed the patients History and Physical, chart, labs and discussed the procedure including the risks,  benefits and alternatives for the proposed anesthesia with the patient or authorized representative who has indicated his/her understanding and acceptance.     Dental advisory given  Plan Discussed with: CRNA  Anesthesia Plan Comments: (PAT note by Karoline Caldwell, PA-C: Follows with cardiology for history of CAD status post inferior STEMI 11/2017 treated with thrombectomy and DES to the mid RCA.  Echo showed LVEF 55-60%.  Last seen by Dr. Harl Bowie 07/27/2019, no recent cardiac symptoms.  Recommend continue current medical therapy.  Follow-up in 1 year.  History of left lower lobe lung cancer treated with SBRT February 2019.  History of chronic respiratory failure with hypoxia on continuous O2 2-4 LPM.  History of cirrhosis secondary to hepatitis C (for which he previously been treated treatment with [place]) and hepatocellular carcinoma s/p thermal ablation therapy followed by gastroenterology.  Last seen by Dr. Laural Golden 01/04/2020.  His AFP was noted to have increased and MRI liver was ordered.  Test was done 01/19/2020 and Dr. Laural Golden commented on result, "Results have been reviewed with patient. He has 2 lung lesions in left lower lobe and multiple liver lesions which are LI-RADS 3 but one lesion is LI-RADS 5 consistent with recurrent HCC."  We will need day of surgery labs and evaluation.  CT chest 01/28/2020: IMPRESSION: 1. New mass lesion and adjacent nodule in the LEFT lower lobe consistent with lung cancer recurrence. 2. New LEFT hilar lymph nodes consistent with nodal metastasis. 3. New thick-walled cavitary lesion in the RIGHT upper lobe is concerning for metachronous bronchogenic carcinoma  versus metastasis. 4. Additional nodules in the periphery of the RIGHT lower lobe are concerning. 5. Consider FDG PET scan for further evaluation. 6. Hepatocellular carcinoma lesions in the liver described on comparison MRI  TTE 11/24/2017: - Left ventricle: Cannot fully evaluate regional wall  motion as  endocardium is not well seen in some views The basal nferior wall  does appear hypokinetic. OVerall LVEF is normal. The cavity size  was normal. Wall thickness was normal. Systolic function was  normal. The estimated ejection fraction was in the range of 55%  to 60%.  - Right ventricle: The cavity size was mildly dilated.   Cath and PCI 11/23/2017: Prox LAD lesion is 25% stenosed. 1st Mrg lesion is 25% stenosed. Post Atrio lesion is 70% stenosed. Dist RCA lesion is 25% stenosed. Mid RCA lesion is 100% stenosed. After percutaneous thrombectomy, a drug-eluting stent was successfully placed using a STENT SYNERGY DES 3X28. Post intervention, there is a 0% residual stenosis. The left ventricular systolic function is normal. LV end diastolic pressure is moderately elevated. LVEDP 27 mm Hg. The left ventricular ejection fraction is 45-50% by visual estimate. There is no aortic valve stenosis.   Recommend uninterrupted dual antiplatelet therapy with Aspirin 81mg  daily and Ticagrelor 90mg  twice daily for a minimum of 12 months (ACS - Class I recommendation).    PFT 05/06/2017: FVC-%Pred-Pre Latest Units: % 93 FEV1-%Pred-Pre Latest Units: % 40 FEV1FVC-%Pred-Pre Latest Units: % 43 TLC % pred Latest Units: % 139 RV % pred Latest Units: % 234 DLCO unc % pred Latest Units: % 37  )      Anesthesia Quick Evaluation

## 2020-02-29 ENCOUNTER — Ambulatory Visit (HOSPITAL_COMMUNITY): Payer: Medicare Other | Admitting: Physician Assistant

## 2020-02-29 ENCOUNTER — Other Ambulatory Visit: Payer: Self-pay

## 2020-02-29 ENCOUNTER — Ambulatory Visit (HOSPITAL_COMMUNITY): Payer: Medicare Other

## 2020-02-29 ENCOUNTER — Encounter (HOSPITAL_COMMUNITY)
Admission: RE | Disposition: A | Discharge status: Home / Self Care | Payer: Self-pay | Source: Home / Self Care | Attending: Pulmonary Disease

## 2020-02-29 ENCOUNTER — Ambulatory Visit (HOSPITAL_COMMUNITY)
Admission: RE | Admit: 2020-02-29 | Discharge: 2020-02-29 | Disposition: A | Discharge status: Home / Self Care | Payer: Medicare Other | Attending: Pulmonary Disease | Admitting: Pulmonary Disease

## 2020-02-29 ENCOUNTER — Encounter (HOSPITAL_COMMUNITY): Payer: Self-pay | Admitting: Pulmonary Disease

## 2020-02-29 DIAGNOSIS — Z20822 Contact with and (suspected) exposure to covid-19: Secondary | ICD-10-CM | POA: Diagnosis not present

## 2020-02-29 DIAGNOSIS — R846 Abnormal cytological findings in specimens from respiratory organs and thorax: Secondary | ICD-10-CM | POA: Insufficient documentation

## 2020-02-29 DIAGNOSIS — R918 Other nonspecific abnormal finding of lung field: Secondary | ICD-10-CM | POA: Diagnosis not present

## 2020-02-29 DIAGNOSIS — Z955 Presence of coronary angioplasty implant and graft: Secondary | ICD-10-CM | POA: Diagnosis not present

## 2020-02-29 DIAGNOSIS — F17210 Nicotine dependence, cigarettes, uncomplicated: Secondary | ICD-10-CM | POA: Diagnosis not present

## 2020-02-29 DIAGNOSIS — R599 Enlarged lymph nodes, unspecified: Secondary | ICD-10-CM

## 2020-02-29 DIAGNOSIS — R590 Localized enlarged lymph nodes: Secondary | ICD-10-CM | POA: Insufficient documentation

## 2020-02-29 DIAGNOSIS — Z85118 Personal history of other malignant neoplasm of bronchus and lung: Secondary | ICD-10-CM | POA: Diagnosis not present

## 2020-02-29 DIAGNOSIS — Z8505 Personal history of malignant neoplasm of liver: Secondary | ICD-10-CM | POA: Diagnosis not present

## 2020-02-29 DIAGNOSIS — Z9889 Other specified postprocedural states: Secondary | ICD-10-CM | POA: Diagnosis not present

## 2020-02-29 HISTORY — PX: BRONCHIAL BIOPSY: SHX5109

## 2020-02-29 HISTORY — PX: BRONCHIAL BRUSHINGS: SHX5108

## 2020-02-29 HISTORY — PX: VIDEO BRONCHOSCOPY WITH ENDOBRONCHIAL NAVIGATION: SHX6175

## 2020-02-29 HISTORY — PX: BRONCHIAL NEEDLE ASPIRATION BIOPSY: SHX5106

## 2020-02-29 HISTORY — PX: BRONCHIAL WASHINGS: SHX5105

## 2020-02-29 HISTORY — PX: ENDOBRONCHIAL ULTRASOUND: SHX5096

## 2020-02-29 HISTORY — PX: FINE NEEDLE ASPIRATION: SHX6590

## 2020-02-29 LAB — COMPREHENSIVE METABOLIC PANEL
ALT: 47 U/L — ABNORMAL HIGH (ref 0–44)
AST: 39 U/L (ref 15–41)
Albumin: 3.5 g/dL (ref 3.5–5.0)
Alkaline Phosphatase: 112 U/L (ref 38–126)
Anion gap: 12 (ref 5–15)
BUN: 14 mg/dL (ref 8–23)
CO2: 30 mmol/L (ref 22–32)
Calcium: 9.2 mg/dL (ref 8.9–10.3)
Chloride: 97 mmol/L — ABNORMAL LOW (ref 98–111)
Creatinine, Ser: 0.53 mg/dL — ABNORMAL LOW (ref 0.61–1.24)
GFR, Estimated: >60 mL/min (ref >60)
Glucose, Bld: 92 mg/dL (ref 70–99)
Potassium: 4.3 mmol/L (ref 3.5–5.1)
Sodium: 139 mmol/L (ref 135–145)
Total Bilirubin: 0.6 mg/dL (ref 0.3–1.2)
Total Protein: 6.9 g/dL (ref 6.5–8.1)

## 2020-02-29 LAB — CBC
HCT: 38.7 % — ABNORMAL LOW (ref 39.0–52.0)
Hemoglobin: 12.5 g/dL — ABNORMAL LOW (ref 13.0–17.0)
MCH: 33.4 pg (ref 26.0–34.0)
MCHC: 32.3 g/dL (ref 30.0–36.0)
MCV: 103.5 fL — ABNORMAL HIGH (ref 80.0–100.0)
Platelets: 272 10*3/uL (ref 150–400)
RBC: 3.74 MIL/uL — ABNORMAL LOW (ref 4.22–5.81)
RDW: 11.1 % — ABNORMAL LOW (ref 11.5–15.5)
WBC: 11.1 10*3/uL — ABNORMAL HIGH (ref 4.0–10.5)
nRBC: 0 % (ref 0.0–0.2)

## 2020-02-29 LAB — PROTIME-INR
INR: 1.1 (ref 0.8–1.2)
Prothrombin Time: 13.8 seconds (ref 11.4–15.2)

## 2020-02-29 LAB — SARS CORONAVIRUS 2 BY RT PCR (HOSPITAL ORDER, PERFORMED IN ~~LOC~~ HOSPITAL LAB): SARS Coronavirus 2: NEGATIVE

## 2020-02-29 LAB — APTT: aPTT: 32 seconds (ref 24–36)

## 2020-02-29 SURGERY — VIDEO BRONCHOSCOPY WITH ENDOBRONCHIAL NAVIGATION
Anesthesia: General

## 2020-02-29 MED ORDER — ONDANSETRON HCL 4 MG/2ML IJ SOLN: INTRAMUSCULAR | Status: DC | PRN

## 2020-02-29 MED ORDER — LACTATED RINGERS IV SOLN: INTRAVENOUS | Status: DC

## 2020-02-29 MED ORDER — CHLORHEXIDINE GLUCONATE 0.12 % MT SOLN
15.0000 mL | Freq: Once | OROMUCOSAL | Status: AC
  Filled 2020-02-29: qty 15

## 2020-02-29 MED ORDER — MEPERIDINE HCL 100 MG/ML IJ SOLN: 6.2500 mg | INTRAMUSCULAR | Status: DC | PRN

## 2020-02-29 MED ORDER — FENTANYL CITRATE (PF) 250 MCG/5ML IJ SOLN
INTRAMUSCULAR | Status: DC | PRN
  Administered 2020-02-29: 50 ug via INTRAVENOUS

## 2020-02-29 MED ORDER — CHLORHEXIDINE GLUCONATE 0.12 % MT SOLN
OROMUCOSAL | Status: AC
  Administered 2020-02-29: 15 mL via OROMUCOSAL
  Filled 2020-02-29: qty 15

## 2020-02-29 MED ORDER — SUGAMMADEX SODIUM 200 MG/2ML IV SOLN
INTRAVENOUS | Status: DC | PRN
  Administered 2020-02-29: 200 mg via INTRAVENOUS

## 2020-02-29 MED ORDER — ONDANSETRON HCL 4 MG/2ML IJ SOLN: 4.0000 mg | Freq: Once | INTRAMUSCULAR | Status: DC | PRN

## 2020-02-29 MED ORDER — ROCURONIUM BROMIDE 10 MG/ML (PF) SYRINGE
PREFILLED_SYRINGE | INTRAVENOUS | Status: DC | PRN
  Administered 2020-02-29: 50 mg via INTRAVENOUS

## 2020-02-29 MED ORDER — PHENYLEPHRINE HCL-NACL 10-0.9 MG/250ML-% IV SOLN
INTRAVENOUS | Status: DC | PRN
  Administered 2020-02-29: 50 ug/min via INTRAVENOUS

## 2020-02-29 MED ORDER — LIDOCAINE 2% (20 MG/ML) 5 ML SYRINGE
INTRAMUSCULAR | Status: DC | PRN
  Administered 2020-02-29: 60 mg via INTRAVENOUS

## 2020-02-29 MED ORDER — PHENYLEPHRINE 40 MCG/ML (10ML) SYRINGE FOR IV PUSH (FOR BLOOD PRESSURE SUPPORT)
PREFILLED_SYRINGE | INTRAVENOUS | Status: DC | PRN
  Administered 2020-02-29: 80 ug via INTRAVENOUS
  Administered 2020-02-29: 120 ug via INTRAVENOUS
  Administered 2020-02-29: 80 ug via INTRAVENOUS

## 2020-02-29 MED ORDER — PROPOFOL 10 MG/ML IV BOLUS
INTRAVENOUS | Status: DC | PRN
  Administered 2020-02-29: 140 mg via INTRAVENOUS

## 2020-02-29 MED ORDER — FENTANYL CITRATE (PF) 100 MCG/2ML IJ SOLN: 25.0000 ug | INTRAMUSCULAR | Status: DC | PRN

## 2020-02-29 SURGICAL SUPPLY — 46 items
ADAPTER BRONCH F/PENTAX (ADAPTER) ×5 IMPLANT
ADAPTER VALVE BIOPSY EBUS (MISCELLANEOUS) IMPLANT
ADPR BSCP EDG PNTX (ADAPTER) ×3
ADPTR VALVE BIOPSY EBUS (MISCELLANEOUS)
BRUSH CYTOL CELLEBRITY 1.5X140 (MISCELLANEOUS) ×5 IMPLANT
BRUSH SUPERTRAX BIOPSY (INSTRUMENTS) IMPLANT
BRUSH SUPERTRAX NDL-TIP CYTO (INSTRUMENTS) ×5 IMPLANT
CANISTER SUCT 3000ML PPV (MISCELLANEOUS) ×5 IMPLANT
CHANNEL WORK EXTEND EDGE 180 (KITS) IMPLANT
CHANNEL WORK EXTEND EDGE 45 (KITS) IMPLANT
CHANNEL WORK EXTEND EDGE 90 (KITS) IMPLANT
CONT SPEC 4OZ CLIKSEAL STRL BL (MISCELLANEOUS) ×5 IMPLANT
COVER BACK TABLE 60X90IN (DRAPES) ×5 IMPLANT
FILTER STRAW FLUID ASPIR (MISCELLANEOUS) IMPLANT
FORCEPS BIOP SUPERTRX PREMAR (INSTRUMENTS) ×5 IMPLANT
GAUZE SPONGE 4X4 12PLY STRL (GAUZE/BANDAGES/DRESSINGS) ×5 IMPLANT
GLOVE SURG SS PI 7.5 STRL IVOR (GLOVE) ×10 IMPLANT
GOWN STRL REUS W/ TWL LRG LVL3 (GOWN DISPOSABLE) ×6 IMPLANT
GOWN STRL REUS W/TWL LRG LVL3 (GOWN DISPOSABLE) ×10
KIT CLEAN ENDO COMPLIANCE (KITS) ×5 IMPLANT
KIT LOCATABLE GUIDE (CANNULA) IMPLANT
KIT MARKER FIDUCIAL DELIVERY (KITS) IMPLANT
KIT PROCEDURE EDGE 180 (KITS) IMPLANT
KIT PROCEDURE EDGE 45 (KITS) IMPLANT
KIT PROCEDURE EDGE 90 (KITS) IMPLANT
KIT TURNOVER KIT B (KITS) ×5 IMPLANT
MARKER SKIN DUAL TIP RULER LAB (MISCELLANEOUS) ×5 IMPLANT
NEEDLE SUPERTRX PREMARK BIOPSY (NEEDLE) ×5 IMPLANT
NS IRRIG 1000ML POUR BTL (IV SOLUTION) ×5 IMPLANT
OIL SILICONE PENTAX (PARTS (SERVICE/REPAIRS)) ×5 IMPLANT
PAD ARMBOARD 7.5X6 YLW CONV (MISCELLANEOUS) ×10 IMPLANT
PATCHES PATIENT (LABEL) ×15 IMPLANT
SOL ANTI FOG 6CC (MISCELLANEOUS) ×3 IMPLANT
SOLUTION ANTI FOG 6CC (MISCELLANEOUS) ×2
SYR 20CC LL (SYRINGE) ×5 IMPLANT
SYR 20ML ECCENTRIC (SYRINGE) ×5 IMPLANT
SYR 50ML SLIP (SYRINGE) ×5 IMPLANT
TOWEL OR 17X24 6PK STRL BLUE (TOWEL DISPOSABLE) ×5 IMPLANT
TRAP SPECIMEN MUCOUS 40CC (MISCELLANEOUS) IMPLANT
TUBE CONNECTING 20'X1/4 (TUBING) ×1
TUBE CONNECTING 20X1/4 (TUBING) ×4 IMPLANT
UNDERPAD 30X30 (UNDERPADS AND DIAPERS) ×5 IMPLANT
VALVE BIOPSY  SINGLE USE (MISCELLANEOUS) ×5
VALVE BIOPSY SINGLE USE (MISCELLANEOUS) ×3 IMPLANT
VALVE SUCTION BRONCHIO DISP (MISCELLANEOUS) ×5 IMPLANT
WATER STERILE IRR 1000ML POUR (IV SOLUTION) ×5 IMPLANT

## 2020-02-29 NOTE — Interval H&P Note (Signed)
History and Physical Interval Note:  02/29/2020 2:58 PM  Adam Bass  has presented today for surgery, with the diagnosis of Lung mass, mediastinal adenopathy.  The various methods of treatment have been discussed with the patient and family. After consideration of risks, benefits and other options for treatment, the patient has consented to  Procedure(s): [place] (Bilateral) ENDOBRONCHIAL ULTRASOUND (N/A) as a surgical intervention.  The patient's history has been reviewed, patient examined, no change in status, stable for surgery.  I have reviewed the patient's chart and labs.  Questions were answered to the patient's satisfaction.    Patient seen in preop.  Discussed risk benefits and alternatives of proceeding.  No barriers at this time.  Patient is agreeable to proceed with bronchoscopy.  [place]

## 2020-02-29 NOTE — Consult Note (Addendum)
Synopsis: Referred in November 2021 for abnormal CT chest by No ref. provider found  Subjective:   PATIENT ID: Adam Bass GENDER: male DOB: [birth year], MRN: [id number]  This is a 69 year old gentleman with a past medical history of left lower lobe lung cancer as well as hepatocellular carcinoma.  He had a left lower lobe tumor treated with a 54 Gy in 3 fractions of 18 Gray in 2019.  His initial biopsy was in December 2018 that favored non-small cell lung cancer, squamous cell carcinoma.  Followed by Dr. Isidore Moos in the radiation oncology department.  Patient ultimately had follow-up images which revealed a new lobulated left lower lobe lesion, associated adenopathy within the left hilum and a cavitary lesion within the right upper lobe.  PET scan was completed which demonstrated PET avid uptake within these lesions described above as well as concern for progression of his hepatocellular carcinoma.  Due to the patient having a prior CT-guided biopsy he did not want to have this done as he had a pneumothorax related to that in the past.  Patient was referred to me for evaluation of navigational bronchoscopy and endobronchial ultrasound with transbronchial needle aspirations.    Past Medical History:  Diagnosis Date  . Anticoagulant long-term use    brillinta  . Bilateral lower extremity edema    feet, noticed since discharge from hospital admission 05-18-2018, "i was laid up in the bed for five days"   . CAD (coronary artery disease) cardiologist-- dr j. branch   hx STEMI 11-23-2017,  s/p  cardiac cath with Thrombectomy, PCI and DES x1 to midRCA, and other nonobstrucitve CAD involving pLAD, OM1, dRCA, posterior atrio, LVEF 45-50%,  LVEDP 68mmHg  . COPD (chronic obstructive pulmonary disease) with emphysema [facility])    pulmonology-- dr wert-- GOLD III mix (pt still smokes),  last exacerbation -due to the flu , discharged 05-18-2018 at [facility]   . DOE (dyspnea on exertion)   . Ectatic  abdominal aorta ([place])    MRI 11/ 2019  . Hepatitis C GI-- dr Laural Golden   dx 02-02-2018--- currently treated on antiviral medication  . Hepatocellular carcinoma ([place])   . History of radiation therapy 06/03/17- 06/09/17   Left Lung treated to 54 Gy with 3 fx of 18 Gy. SBRT  . History of skin cancer    04/ 2017 excision from face  . History of ST elevation myocardial infarction (STEMI) 11/23/2017   inferior wall-- s/p cardiac cath w/ thrombectomy, PCI, and DES  . HLD (hyperlipidemia)   . Hypertension   . Primary squamous cell carcinoma of left lung [facility]) oncologist-- dr Isidore Moos   dx 02/ 2018-- Stage IA2 Left lower lung,  s/p  Stereotactic radiation completed 06-09-2017  . Productive cough    per pt mostly clear  . Requires supplemental oxygen    06-09-2018 currently pt is using a portable O2 as needed and at night  . S/P drug eluting coronary stent placement 11/23/2017   DES x1 to midRCA  . Urinary incontinence    wear depends  . Wears dentures    fuller upper,  lower partial  . Wears glasses      Family History  Problem Relation Age of Onset  . Breast cancer Sister   . Colon cancer Neg Hx      Past Surgical History:  Procedure Laterality Date  . COLONOSCOPY N/A 08/27/2012   Procedure: COLONOSCOPY;  Surgeon: Rogene Houston, MD;  Location: [facility];  Service: Endoscopy;  Laterality: N/A;  1200  . CORONARY STENT INTERVENTION N/A 11/23/2017   Procedure: CORONARY STENT INTERVENTION;  Surgeon: Jettie Booze, MD;  Location: [place] CV LAB;  Service: Cardiovascular;  Laterality: N/A;  . IR RADIOLOGIST EVAL & MGMT  03/04/2018  . IR RADIOLOGIST EVAL & MGMT  10/06/2018  . IR RADIOLOGIST EVAL & MGMT  01/26/2019  . IR RADIOLOGIST EVAL & MGMT  08/11/2019  . IR RADIOLOGIST EVAL & MGMT  01/25/2020  . LEFT HEART CATH AND CORONARY ANGIOGRAPHY N/A 11/23/2017   Procedure: LEFT HEART CATH AND CORONARY ANGIOGRAPHY;  Surgeon: Jettie Booze, MD;  Location: [place] CV LAB;   Service: Cardiovascular;  Laterality: N/A;  . RADIOFREQUENCY ABLATION N/A 06/10/2018   Procedure: MICROWAVE THERMAL ABLATION LIVER;  Surgeon: Aletta Edouard, MD;  Location: [facility] ORS;  Service: Anesthesiology;  Laterality: N/A;    Social History   Socioeconomic History  . Marital status: Divorced    Spouse name: Not on file  . Number of children: Not on file  . Years of education: Not on file  . Highest education level: Not on file  Occupational History  . Not on file  Tobacco Use  . Smoking status: Current Every Day Smoker    Packs/day: 0.75    Years: 50.00    Pack years: 37.50    Types: Cigars  . Smokeless tobacco: Never Used  . Tobacco comment: . He is smoking about 5-10 cigarettes daily.   Vaping Use  . Vaping Use: Former  . Quit date: 10/07/2017  Substance and Sexual Activity  . Alcohol use: Yes    Alcohol/week: 8.0 standard drinks    Types: 8 Cans of beer per week    Comment: 8 beers weekly, give or take   . Drug use: No  . Sexual activity: Not on file  Other Topics Concern  . Not on file  Social History Narrative  . Not on file   Social Determinants of Health   Financial Resource Strain:   . Difficulty of Paying Living Expenses: Not on file  Food Insecurity:   . Worried About Charity fundraiser in the Last Year: Not on file  . Ran Out of Food in the Last Year: Not on file  Transportation Needs:   . Lack of Transportation (Medical): Not on file  . Lack of Transportation (Non-Medical): Not on file  Physical Activity:   . Days of Exercise per Week: Not on file  . Minutes of Exercise per Session: Not on file  Stress:   . Feeling of Stress : Not on file  Social Connections:   . Frequency of Communication with Friends and Family: Not on file  . Frequency of Social Gatherings with Friends and Family: Not on file  . Attends Religious Services: Not on file  . Active Member of Clubs or Organizations: Not on file  . Attends Archivist Meetings: Not on file   . Marital Status: Not on file  Intimate Partner Violence:   . Fear of Current or Ex-Partner: Not on file  . Emotionally Abused: Not on file  . Physically Abused: Not on file  . Sexually Abused: Not on file     Allergies  Allergen Reactions  . Bee Venom Hives     @ENCMEDSTART @  Review of Systems  Constitutional: Positive for weight loss. Negative for chills, fever and malaise/fatigue.  HENT: Negative for hearing loss, sore throat and tinnitus.   Eyes: Negative for blurred vision and double vision.  Respiratory: Positive for cough. Negative for hemoptysis, sputum production, shortness of breath, wheezing and stridor.   Cardiovascular: Negative for chest pain, palpitations, orthopnea, leg swelling and PND.  Gastrointestinal: Negative for abdominal pain, constipation, diarrhea, heartburn, nausea and vomiting.  Genitourinary: Negative for dysuria, hematuria and urgency.  Musculoskeletal: Negative for joint pain and myalgias.  Skin: Negative for itching and rash.  Neurological: Negative for dizziness, tingling, weakness and headaches.  Endo/Heme/Allergies: Negative for environmental allergies. Does not bruise/bleed easily.  Psychiatric/Behavioral: Negative for depression. The patient is not nervous/anxious and does not have insomnia.   All other systems reviewed and are negative.    Objective:  Physical Exam Vitals reviewed.  Constitutional:      General: He is not in acute distress.    Appearance: He is well-developed.  HENT:     Head: Normocephalic and atraumatic.     Mouth/Throat:     Pharynx: No oropharyngeal exudate.  Eyes:     Conjunctiva/sclera: Conjunctivae normal.     Pupils: Pupils are equal, round, and reactive to light.  Neck:     Vascular: No JVD.     Trachea: No tracheal deviation.     Comments: Loss of supraclavicular fat Cardiovascular:     Rate and Rhythm: Normal rate and regular rhythm.     Heart sounds: S1 normal and S2 normal.     Comments: Distant  heart tones Pulmonary:     Effort: No tachypnea or accessory muscle usage.     Breath sounds: No stridor. Decreased breath sounds (throughout all lung fields) present. No wheezing, rhonchi or rales.  Abdominal:     General: Bowel sounds are normal. There is no distension.     Palpations: Abdomen is soft.     Tenderness: There is no abdominal tenderness.  Musculoskeletal:        General: Deformity (muscle wasting ) present.  Skin:    General: Skin is warm and dry.     Capillary Refill: Capillary refill takes less than 2 seconds.     Findings: No rash.  Neurological:     Mental Status: He is alert and oriented to person, place, and time.  Psychiatric:        Behavior: Behavior normal.      Vitals:   02/29/20 1237  BP: (!) 122/59  Pulse: (!) 59  Resp: 20  Temp: 97.6 F (36.4 C)  TempSrc: Oral  SpO2: 99%  Weight: 62.1 kg  Height: 5\' 9"  (1.753 m)   99% on 2 L nasal cannula BMI Readings from Last 3 Encounters:  02/29/20 20.23 kg/m  02/01/20 20.10 kg/m  01/04/20 20.17 kg/m   Wt Readings from Last 3 Encounters:  02/29/20 62.1 kg  02/01/20 61.7 kg  01/04/20 62 kg     CBC    Component Value Date/Time   WBC 11.1 (H) 02/29/2020 1326   RBC 3.74 (L) 02/29/2020 1326   HGB 12.5 (L) 02/29/2020 1326   HCT 38.7 (L) 02/29/2020 1326   PLT 272 02/29/2020 1326   MCV 103.5 (H) 02/29/2020 1326   MCH 33.4 02/29/2020 1326   MCHC 32.3 02/29/2020 1326   RDW 11.1 (L) 02/29/2020 1326   LYMPHSABS 3.1 05/27/2018 0736   MONOABS 1.5 (H) 05/27/2018 0736   EOSABS 0.2 05/27/2018 0736   BASOSABS 0.1 05/27/2018 0736     Chest Imaging: CT chest October 2021: Lobulated left lower lobe lesion with associated left hilar adenopathy.  Cavitary right upper lobe lesion both concerning for primary bronchogenic carcinomas. The  patient's images have been independently reviewed by me.    Pulmonary Functions Testing Results: PFT Results Latest Ref Rng & Units 05/06/2017  FVC-Pre L 4.07    FVC-Predicted Pre % 93  FVC-Post L 4.14  FVC-Predicted Post % 94  Pre FEV1/FVC % % 32  Post FEV1/FCV % % 37  FEV1-Pre L 1.30  FEV1-Predicted Pre % 40  FEV1-Post L 1.52  DLCO uncorrected ml/min/mmHg 11.73  DLCO UNC% % 37  DLCO corrected ml/min/mmHg 11.31  DLCO COR %Predicted % 36  DLVA Predicted % 38  TLC L 9.49  TLC % Predicted % 139  RV % Predicted % 234        Assessment & Plan:     ICD-10-CM   1. Lung mass  R91.8 Procedural/ Surgical Case Request: VIDEO BRONCHOSCOPY WITH ENDOBRONCHIAL NAVIGATION   Added automatically from request for surgery [id number]  2. Lung mass  R91.8 Procedural/ Surgical Case Request: VIDEO BRONCHOSCOPY WITH ENDOBRONCHIAL NAVIGATION    Discussion:  This is a 69 year old gentleman with a history of hepatocellular carcinoma, prior history of left lower lobe lung cancer, squamous cell, non-small cell lung cancer.  Now with new enlarging lobulated lesion within the left lower lobe associated hilar adenopathy.  Additionally has a right upper lobe cavitary lesion concerning for potential metachronous primary lung cancers.  Plan: He is present today and we discussed the risk benefits and alternatives of proceeding with navigational bronchoscopy and tissue diagnosis. We discussed the risk of bleeding, pneumothorax. Patient is agreeable to proceed.  Additional time spent planning navigation with [place] today.   Current Facility-Administered Medications:  .  lactated ringers infusion, , Intravenous, Continuous, Nolon Nations, MD  I spent 60 minutes dedicated to the care of this patient on the date of this encounter to include pre-visit review of records, face-to-face time with the patient discussing conditions above, post visit ordering of testing, clinical documentation with the electronic health record, making appropriate referrals as documented, and communicating necessary findings to members of the patients care team.   Garner Nash,  DO [facility] 02/29/2020 2:51 PM

## 2020-02-29 NOTE — Discharge Instructions (Signed)
Flexible Bronchoscopy, Care After This sheet gives you information about how to care for yourself after your test. Your doctor may also give you more specific instructions. If you have problems or questions, contact your doctor. Follow these instructions at home: Eating and drinking  The day after the test, go back to your normal diet. Driving  Do not drive for 24 hours if you were given a medicine to help you relax (sedative).  Do not drive or use heavy machinery while taking prescription pain medicine. General instructions   Take over-the-counter and prescription medicines only as told by your doctor.  Return to your normal activities as told. Ask what activities are safe for you.  Do not use any products that have nicotine or tobacco in them. This includes cigarettes and e-cigarettes. If you need help quitting, ask your doctor.  Keep all follow-up visits as told by your doctor. This is important. It is very important if you had a tissue sample (biopsy) taken. Get help right away if:  You have shortness of breath that gets worse.  You get light-headed.  You feel like you are going to pass out (faint).  You have chest pain.  You cough up: ? More than a little blood. ? More blood than before. Summary  Do not eat or drink anything (not even water) for 2 hours after your test, or until your numbing medicine wears off.  Do not use cigarettes. Do not use e-cigarettes.  Get help right away if you have chest pain. This information is not intended to replace advice given to you by your health care provider. Make sure you discuss any questions you have with your health care provider. Document Revised: 03/14/2017 Document Reviewed: 04/19/2016 Elsevier Patient Education  2020 [employer].

## 2020-02-29 NOTE — Op Note (Signed)
Video Bronchoscopy with Electromagnetic Navigation and Endobronchial Ultrasound Procedure Note  Date of Operation: 02/29/2020  Pre-op Diagnosis: Bilateral lung lesions, adenopathy   Post-op Diagnosis: Bilateral lung lesions, adenopathy   Surgeon: Garner Nash, DO   Assistants: None   Anesthesia: General endotracheal anesthesia  Operation: Flexible video fiberoptic bronchoscopy with electromagnetic navigation and biopsies.  Estimated Blood Loss: Minimal  Complications: None   Indications and History: Adam Bass is a 69 y.o. adult with bilateral lung lesions, history of non-small cell lung cancer left lower lobe, history of hepatocellular carcinoma, adenopathy.  The risks, benefits, complications, treatment options and expected outcomes were discussed with the patient.  The possibilities of pneumothorax, pneumonia, reaction to medication, pulmonary aspiration, perforation of a viscus, bleeding, failure to diagnose a condition and creating a complication requiring transfusion or operation were discussed with the patient who freely signed the consent.    Description of Procedure: The patient was seen in the Preoperative Area, was examined and was deemed appropriate to proceed.  The patient was taken to [facility] endoscopy room 2, identified as Adam Bass and the procedure verified as Flexible Video Fiberoptic Bronchoscopy.  A Time Out was held and the above information confirmed.   Prior to the date of the procedure a high-resolution CT scan of the chest was performed. Utilizing [employer] a virtual tracheobronchial tree was generated to allow the creation of distinct navigation pathways to the patient's parenchymal abnormalities. After being taken to the operating room general anesthesia was initiated and the patient  was orally intubated. The video fiberoptic bronchoscope was introduced via the endotracheal tube and a general inspection was performed which showed normal  right and left lung anatomy with no evidence of endobronchial lesion.   Target #1 left lower lobe: The extendable working channel and locator guide were introduced into the bronchoscope.  A full fluoroscopic sweep was obtained from RAO 25 degrees to LAO 25 degrees with inspiratory breath-hold APL at 20 cm of water this was completed for local registration.  The distinct navigation pathways prepared prior to this procedure were then utilized to navigate to within 0.5 cm of patient's lesion(s) identified on CT scan. The extendable working channel was secured into place and the locator guide was withdrawn. Under fluoroscopic guidance transbronchial needle brushings, transbronchial Wang needle biopsies, and transbronchial forceps biopsies were performed to be sent for cytology and pathology. A bronchioalveolar lavage was performed in the left lower lobe and sent for cytology  Target #2 right upper lobe: The extendable working channel and locator guide were introduced into the bronchoscope.  A full fluoroscopic sweep was obtained from RAO 25 degrees to LAO 25 degrees with inspiratory breath-hold APL at 20 cm of water this was completed for local registration.  The distinct navigation pathways prepared prior to this procedure were then utilized to navigate to within 0.8 cm of patient's lesion(s) identified on CT scan. The extendable working channel was secured into place and the locator guide was withdrawn. Under fluoroscopic guidance transbronchial needle brushings, transbronchial Wang needle biopsies, and transbronchial forceps biopsies were performed to be sent for cytology and pathology. A bronchioalveolar lavage was performed in the right upper lobe and sent for cytology  Target #3 endobronchial ultrasound 11 L, left hilum: The standard scope was then withdrawn and the endobronchial ultrasound was used to identify and characterize the peritracheal, hilar and bronchial lymph nodes. Inspection showed enlarged  11 L left hilar node. Using real-time ultrasound guidance Wang needle biopsies were take from Station  11 L nodes and were sent for cytology. The patient tolerated the procedure well without apparent complications.   Target #4 endobronchial ultrasound station 7, subcarinal: Inspection showed enlarged station 7 subcarinal node. Using real-time ultrasound guidance Wang needle biopsies were take from Station 7 nodes and were sent for cytology. The patient tolerated the procedure well without apparent complications.   At the end of the procedure a general airway inspection was performed and there was no evidence of active bleeding.  Therapeutic bronchoscope was inserted into the airway and aspiration of the bilateral mainstem's was done clearance of any remaining blood clots, secretions or debris.  There was patency of the distal subsegments at the termination of the procedure.  Bronchoscope was brought to just above the main carina and there was no evidence of active bleeding.  The bronchoscope was removed.  The patient tolerated the procedure well. There was no significant blood loss and there were no obvious complications. A post-procedural chest x-ray is pending.  Samples target #1: 1. Transbronchial needle brushings from left lower lobe 2. Transbronchial Wang needle biopsies from left lower lobe 3. Transbronchial forceps biopsies from left lower lobe 4. Bronchoalveolar lavage from left lower lobe  Samples: 1. Transbronchial needle brushings from right upper lobe 2. Transbronchial Wang needle biopsies from right upper lobe 3. Transbronchial forceps biopsies from right upper lobe 4. Bronchoalveolar lavage from right upper lobe  Samples target #3, target #4: 1. Wang needle biopsies from 11 L node 2. Wang needle biopsies from 7 node  Plans:  The patient will be discharged from the PACU to home when recovered from anesthesia and after chest x-ray is reviewed. We will review the cytology, pathology  and microbiology results with the patient when they become available. Outpatient followup will be with Dr. Isidore Moos.   Garner Nash, DO [facility] 02/29/2020 4:31 PM

## 2020-02-29 NOTE — Transfer of Care (Signed)
Immediate Anesthesia Transfer of Care Note  Patient: Adam Bass  Procedure(s) Performed: VIDEO BRONCHOSCOPY WITH ENDOBRONCHIAL NAVIGATION (Bilateral ) ENDOBRONCHIAL ULTRASOUND (N/A ) BRONCHIAL WASHINGS BRONCHIAL BIOPSIES BRONCHIAL NEEDLE ASPIRATION BIOPSIES BRONCHIAL BRUSHINGS FINE NEEDLE ASPIRATION  Patient Location: Endoscopy Unit  Anesthesia Type:General  Level of Consciousness: awake, alert  and oriented  Airway & Oxygen Therapy: Patient Spontanous Breathing and Patient connected to nasal cannula oxygen  Post-op Assessment: Report given to RN and Post -op Vital signs reviewed and stable  Post vital signs: Reviewed and stable  Last Vitals:  Vitals Value Taken Time  BP 120/51 02/29/20 1642  Temp    Pulse 88 02/29/20 1644  Resp 11 02/29/20 1644  SpO2 85 % 02/29/20 1644  Vitals shown include unvalidated device data.  Last Pain:  Vitals:   02/29/20 1342  TempSrc:   PainSc: 0-No pain         Complications: No complications documented.

## 2020-02-29 NOTE — Anesthesia Postprocedure Evaluation (Signed)
Anesthesia Post Note  Patient: DARIN ARNDT  Procedure(s) Performed: VIDEO BRONCHOSCOPY WITH ENDOBRONCHIAL NAVIGATION (Bilateral ) ENDOBRONCHIAL ULTRASOUND (N/A ) BRONCHIAL WASHINGS BRONCHIAL BIOPSIES BRONCHIAL NEEDLE ASPIRATION BIOPSIES BRONCHIAL BRUSHINGS FINE NEEDLE ASPIRATION     Patient location during evaluation: PACU Anesthesia Type: General Level of consciousness: awake and alert Pain management: pain level controlled Vital Signs Assessment: post-procedure vital signs reviewed and stable Respiratory status: spontaneous breathing, nonlabored ventilation, respiratory function stable and patient connected to nasal cannula oxygen Cardiovascular status: blood pressure returned to baseline and stable Postop Assessment: no apparent nausea or vomiting Anesthetic complications: no   No complications documented.  Last Vitals:  Vitals:   02/29/20 1700 02/29/20 1710  BP: (!) 105/53 (!) 106/43  Pulse: 65 76  Resp: 12 16  Temp:    SpO2: (!) 88% 91%    Last Pain:  Vitals:   02/29/20 1710  TempSrc:   PainSc: 0-No pain                 Audry Pili

## 2020-02-29 NOTE — Anesthesia Procedure Notes (Signed)
Procedure Name: Intubation Date/Time: 02/29/2020 3:06 PM Performed by: Valda Favia, CRNA Pre-anesthesia Checklist: Patient identified, Emergency Drugs available, Suction available and Patient being monitored Patient Re-evaluated:Patient Re-evaluated prior to induction Oxygen Delivery Method: Circle System Utilized Preoxygenation: Pre-oxygenation with 100% oxygen Induction Type: IV induction Ventilation: Mask ventilation without difficulty Laryngoscope Size: Mac and 4 Grade View: Grade I Tube type: Oral Tube size: 8.5 mm Number of attempts: 1 Airway Equipment and Method: Stylet and Oral airway Placement Confirmation: ETT inserted through vocal cords under direct vision,  positive ETCO2 and breath sounds checked- equal and bilateral Secured at: 23 cm Tube secured with: Tape Dental Injury: Teeth and Oropharynx as per pre-operative assessment

## 2020-02-29 NOTE — H&P (View-Only) (Signed)
Synopsis: Referred in November 2021 for abnormal CT chest by No ref. provider found  Subjective:   PATIENT ID: Adam Bass GENDER: male DOB: 01/19/1951, MRN: [id number]  This is a 69 year old gentleman with a past medical history of left lower lobe lung cancer as well as hepatocellular carcinoma.  He had a left lower lobe tumor treated with a 54 Gy in 3 fractions of 18 Gray in 2019.  His initial biopsy was in December 2018 that favored non-small cell lung cancer, squamous cell carcinoma.  Followed by Dr. Isidore Moos in the radiation oncology department.  Patient ultimately had follow-up images which revealed a new lobulated left lower lobe lesion, associated adenopathy within the left hilum and a cavitary lesion within the right upper lobe.  PET scan was completed which demonstrated PET avid uptake within these lesions described above as well as concern for progression of his hepatocellular carcinoma.  Due to the patient having a prior CT-guided biopsy he did not want to have this done as he had a pneumothorax related to that in the past.  Patient was referred to me for evaluation of navigational bronchoscopy and endobronchial ultrasound with transbronchial needle aspirations.    Past Medical History:  Diagnosis Date  . Anticoagulant long-term use    brillinta  . Bilateral lower extremity edema    feet, noticed since discharge from hospital admission 05-18-2018, "i was laid up in the bed for five days"   . CAD (coronary artery disease) cardiologist-- dr j. branch   hx STEMI 11-23-2017,  s/p  cardiac cath with Thrombectomy, PCI and DES x1 to midRCA, and other nonobstrucitve CAD involving pLAD, OM1, dRCA, posterior atrio, LVEF 45-50%,  LVEDP 59mmHg  . COPD (chronic obstructive pulmonary disease) with emphysema [facility])    pulmonology-- dr wert-- GOLD III mix (pt still smokes),  last exacerbation -due to the flu , discharged 05-18-2018 at [facility]   . DOE (dyspnea on exertion)   . Ectatic  abdominal aorta ([place])    MRI 11/ 2019  . Hepatitis C GI-- dr Laural Golden   dx 02-02-2018--- currently treated on antiviral medication  . Hepatocellular carcinoma ([place])   . History of radiation therapy 06/03/17- 06/09/17   Left Lung treated to 54 Gy with 3 fx of 18 Gy. SBRT  . History of skin cancer    04/ 2017 excision from face  . History of ST elevation myocardial infarction (STEMI) 11/23/2017   inferior wall-- s/p cardiac cath w/ thrombectomy, PCI, and DES  . HLD (hyperlipidemia)   . Hypertension   . Primary squamous cell carcinoma of left lung [facility]) oncologist-- dr Isidore Moos   dx 02/ 2018-- Stage IA2 Left lower lung,  s/p  Stereotactic radiation completed 06-09-2017  . Productive cough    per pt mostly clear  . Requires supplemental oxygen    06-09-2018 currently pt is using a portable O2 as needed and at night  . S/P drug eluting coronary stent placement 11/23/2017   DES x1 to midRCA  . Urinary incontinence    wear depends  . Wears dentures    fuller upper,  lower partial  . Wears glasses      Family History  Problem Relation Age of Onset  . Breast cancer Sister   . Colon cancer Neg Hx      Past Surgical History:  Procedure Laterality Date  . COLONOSCOPY N/A 08/27/2012   Procedure: COLONOSCOPY;  Surgeon: Rogene Houston, MD;  Location: [facility];  Service: Endoscopy;  Laterality: N/A;  1200  . CORONARY STENT INTERVENTION N/A 11/23/2017   Procedure: CORONARY STENT INTERVENTION;  Surgeon: Jettie Booze, MD;  Location: [facility];  Service: Cardiovascular;  Laterality: N/A;  . IR RADIOLOGIST EVAL & MGMT  03/04/2018  . IR RADIOLOGIST EVAL & MGMT  10/06/2018  . IR RADIOLOGIST EVAL & MGMT  01/26/2019  . IR RADIOLOGIST EVAL & MGMT  08/11/2019  . IR RADIOLOGIST EVAL & MGMT  01/25/2020  . LEFT HEART CATH AND CORONARY ANGIOGRAPHY N/A 11/23/2017   Procedure: LEFT HEART CATH AND CORONARY ANGIOGRAPHY;  Surgeon: Jettie Booze, MD;  Location: [facility];   Service: Cardiovascular;  Laterality: N/A;  . RADIOFREQUENCY ABLATION N/A 06/10/2018   Procedure: MICROWAVE THERMAL ABLATION LIVER;  Surgeon: Aletta Edouard, MD;  Location: [facility] ORS;  Service: Anesthesiology;  Laterality: N/A;    Social History   Socioeconomic History  . Marital status: Divorced    Spouse name: Not on file  . Number of children: Not on file  . Years of education: Not on file  . Highest education level: Not on file  Occupational History  . Not on file  Tobacco Use  . Smoking status: Current Every Day Smoker    Packs/day: 0.75    Years: 50.00    Pack years: 37.50    Types: Cigars  . Smokeless tobacco: Never Used  . Tobacco comment: . He is smoking about 5-10 cigarettes daily.   Vaping Use  . Vaping Use: Former  . Quit date: 10/07/2017  Substance and Sexual Activity  . Alcohol use: Yes    Alcohol/week: 8.0 standard drinks    Types: 8 Cans of beer per week    Comment: 8 beers weekly, give or take   . Drug use: No  . Sexual activity: Not on file  Other Topics Concern  . Not on file  Social History Narrative  . Not on file   Social Determinants of Health   Financial Resource Strain:   . Difficulty of Paying Living Expenses: Not on file  Food Insecurity:   . Worried About Charity fundraiser in the Last Year: Not on file  . Ran Out of Food in the Last Year: Not on file  Transportation Needs:   . Lack of Transportation (Medical): Not on file  . Lack of Transportation (Non-Medical): Not on file  Physical Activity:   . Days of Exercise per Week: Not on file  . Minutes of Exercise per Session: Not on file  Stress:   . Feeling of Stress : Not on file  Social Connections:   . Frequency of Communication with Friends and Family: Not on file  . Frequency of Social Gatherings with Friends and Family: Not on file  . Attends Religious Services: Not on file  . Active Member of Clubs or Organizations: Not on file  . Attends Archivist Meetings: Not on file   . Marital Status: Not on file  Intimate Partner Violence:   . Fear of Current or Ex-Partner: Not on file  . Emotionally Abused: Not on file  . Physically Abused: Not on file  . Sexually Abused: Not on file     Allergies  Allergen Reactions  . Bee Venom Hives     @ENCMEDSTART @  Review of Systems  Constitutional: Positive for weight loss. Negative for chills, fever and malaise/fatigue.  HENT: Negative for hearing loss, sore throat and tinnitus.   Eyes: Negative for blurred vision and double vision.  Respiratory: Positive for cough. Negative for hemoptysis, sputum production, shortness of breath, wheezing and stridor.   Cardiovascular: Negative for chest pain, palpitations, orthopnea, leg swelling and PND.  Gastrointestinal: Negative for abdominal pain, constipation, diarrhea, heartburn, nausea and vomiting.  Genitourinary: Negative for dysuria, hematuria and urgency.  Musculoskeletal: Negative for joint pain and myalgias.  Skin: Negative for itching and rash.  Neurological: Negative for dizziness, tingling, weakness and headaches.  Endo/Heme/Allergies: Negative for environmental allergies. Does not bruise/bleed easily.  Psychiatric/Behavioral: Negative for depression. The patient is not nervous/anxious and does not have insomnia.   All other systems reviewed and are negative.    Objective:  Physical Exam Vitals reviewed.  Constitutional:      General: He is not in acute distress.    Appearance: He is well-developed.  HENT:     Head: Normocephalic and atraumatic.     Mouth/Throat:     Pharynx: No oropharyngeal exudate.  Eyes:     Conjunctiva/sclera: Conjunctivae normal.     Pupils: Pupils are equal, round, and reactive to light.  Neck:     Vascular: No JVD.     Trachea: No tracheal deviation.     Comments: Loss of supraclavicular fat Cardiovascular:     Rate and Rhythm: Normal rate and regular rhythm.     Heart sounds: S1 normal and S2 normal.     Comments: Distant  heart tones Pulmonary:     Effort: No tachypnea or accessory muscle usage.     Breath sounds: No stridor. Decreased breath sounds (throughout all lung fields) present. No wheezing, rhonchi or rales.  Abdominal:     General: Bowel sounds are normal. There is no distension.     Palpations: Abdomen is soft.     Tenderness: There is no abdominal tenderness.  Musculoskeletal:        General: Deformity (muscle wasting ) present.  Skin:    General: Skin is warm and dry.     Capillary Refill: Capillary refill takes less than 2 seconds.     Findings: No rash.  Neurological:     Mental Status: He is alert and oriented to person, place, and time.  Psychiatric:        Behavior: Behavior normal.      Vitals:   02/29/20 1237  BP: (!) 122/59  Pulse: (!) 59  Resp: 20  Temp: 97.6 F (36.4 C)  TempSrc: Oral  SpO2: 99%  Weight: 62.1 kg  Height: 5\' 9"  (1.753 m)   99% on 2 L nasal cannula BMI Readings from Last 3 Encounters:  02/29/20 20.23 kg/m  02/01/20 20.10 kg/m  01/04/20 20.17 kg/m   Wt Readings from Last 3 Encounters:  02/29/20 62.1 kg  02/01/20 61.7 kg  01/04/20 62 kg     CBC    Component Value Date/Time   WBC 11.1 (H) 02/29/2020 1326   RBC 3.74 (L) 02/29/2020 1326   HGB 12.5 (L) 02/29/2020 1326   HCT 38.7 (L) 02/29/2020 1326   PLT 272 02/29/2020 1326   MCV 103.5 (H) 02/29/2020 1326   MCH 33.4 02/29/2020 1326   MCHC 32.3 02/29/2020 1326   RDW 11.1 (L) 02/29/2020 1326   LYMPHSABS 3.1 05/27/2018 0736   MONOABS 1.5 (H) 05/27/2018 0736   EOSABS 0.2 05/27/2018 0736   BASOSABS 0.1 05/27/2018 0736     Chest Imaging: CT chest October 2021: Lobulated left lower lobe lesion with associated left hilar adenopathy.  Cavitary right upper lobe lesion both concerning for primary bronchogenic carcinomas. The  patient's images have been independently reviewed by me.    Pulmonary Functions Testing Results: PFT Results Latest Ref Rng & Units 05/06/2017  FVC-Pre L 4.07    FVC-Predicted Pre % 93  FVC-Post L 4.14  FVC-Predicted Post % 94  Pre FEV1/FVC % % 32  Post FEV1/FCV % % 37  FEV1-Pre L 1.30  FEV1-Predicted Pre % 40  FEV1-Post L 1.52  DLCO uncorrected ml/min/mmHg 11.73  DLCO UNC% % 37  DLCO corrected ml/min/mmHg 11.31  DLCO COR %Predicted % 36  DLVA Predicted % 38  TLC L 9.49  TLC % Predicted % 139  RV % Predicted % 234        Assessment & Plan:     ICD-10-CM   1. Lung mass  R91.8 Procedural/ Surgical Case Request: VIDEO BRONCHOSCOPY WITH ENDOBRONCHIAL NAVIGATION   Added automatically from request for surgery [id number]  2. Lung mass  R91.8 Procedural/ Surgical Case Request: VIDEO BRONCHOSCOPY WITH ENDOBRONCHIAL NAVIGATION    Discussion:  This is a 69 year old gentleman with a history of hepatocellular carcinoma, prior history of left lower lobe lung cancer, squamous cell, non-small cell lung cancer.  Now with new enlarging lobulated lesion within the left lower lobe associated hilar adenopathy.  Additionally has a right upper lobe cavitary lesion concerning for potential metachronous primary lung cancers.  Plan: He is present today and we discussed the risk benefits and alternatives of proceeding with navigational bronchoscopy and tissue diagnosis. We discussed the risk of bleeding, pneumothorax. Patient is agreeable to proceed.  Additional time spent planning navigation with [place] today.   Current Facility-Administered Medications:  .  lactated ringers infusion, , Intravenous, Continuous, Nolon Nations, MD  I spent 60 minutes dedicated to the care of this patient on the date of this encounter to include pre-visit review of records, face-to-face time with the patient discussing conditions above, post visit ordering of testing, clinical documentation with the electronic health record, making appropriate referrals as documented, and communicating necessary findings to members of the patients care team.   Garner Nash,  DO [facility] 02/29/2020 2:51 PM

## 2020-03-01 ENCOUNTER — Other Ambulatory Visit: Payer: Self-pay | Admitting: Pulmonary Disease

## 2020-03-01 LAB — ACID FAST SMEAR (AFB, MYCOBACTERIA): Acid Fast Smear: NEGATIVE

## 2020-03-01 LAB — CULTURE, BAL-QUANTITATIVE W GRAM STAIN: Culture: >=100000 — AB

## 2020-03-01 MED ORDER — AMOXICILLIN-POT CLAVULANATE 875-125 MG PO TABS: 1.0000 | ORAL_TABLET | Freq: Two times a day (BID) | ORAL | 0 refills | Status: DC

## 2020-03-02 ENCOUNTER — Telehealth: Payer: Self-pay | Admitting: Pulmonary Disease

## 2020-03-02 NOTE — Telephone Encounter (Signed)
PCCM:  I called to explain we do not have his path results back as of yet.   I will call as soon as they come in   Garner Nash, DO [facility] 03/02/2020 5:19 PM

## 2020-03-02 NOTE — Telephone Encounter (Signed)
Spoke with the pt  He is asking for results of bronch  Please advise, thanks!

## 2020-03-03 ENCOUNTER — Ambulatory Visit: Payer: Self-pay | Admitting: Radiation Oncology

## 2020-03-03 LAB — CYTOLOGY - NON PAP

## 2020-03-05 ENCOUNTER — Encounter (INDEPENDENT_AMBULATORY_CARE_PROVIDER_SITE_OTHER): Payer: Self-pay

## 2020-03-05 LAB — ANAEROBIC CULTURE

## 2020-03-06 ENCOUNTER — Other Ambulatory Visit (INDEPENDENT_AMBULATORY_CARE_PROVIDER_SITE_OTHER): Payer: Self-pay | Admitting: *Deleted

## 2020-03-06 ENCOUNTER — Telehealth: Payer: Self-pay | Admitting: Pulmonary Disease

## 2020-03-06 DIAGNOSIS — R772 Abnormality of alphafetoprotein: Secondary | ICD-10-CM

## 2020-03-06 DIAGNOSIS — C220 Liver cell carcinoma: Secondary | ICD-10-CM

## 2020-03-06 DIAGNOSIS — R7309 Other abnormal glucose: Secondary | ICD-10-CM

## 2020-03-06 NOTE — Telephone Encounter (Signed)
PCCM:  I called and spoke with the patient regarding recent pathology results.  Left lower lobe as well as station 7 and hilum positive for non-small cell lung cancer.  Forwarded pathology results to Dr. Isidore Moos.  Patient will need referral to medical oncology.  I will let Dr. Isidore Moos make this referral to medical oncology of her choice.  Garner Nash, DO [facility] 03/06/2020 6:39 PM

## 2020-03-08 ENCOUNTER — Other Ambulatory Visit: Payer: Self-pay

## 2020-03-08 ENCOUNTER — Encounter: Payer: Self-pay | Admitting: *Deleted

## 2020-03-08 DIAGNOSIS — C3432 Malignant neoplasm of lower lobe, left bronchus or lung: Secondary | ICD-10-CM

## 2020-03-08 DIAGNOSIS — R590 Localized enlarged lymph nodes: Secondary | ICD-10-CM

## 2020-03-08 NOTE — Progress Notes (Signed)
I received referral on Mr. Knisely today.  I notified new patient scheduler to call and schedule with Dr. Julien Nordmann on 03/13/20 with labs.

## 2020-03-08 NOTE — Telephone Encounter (Signed)
With a history of hepatocellular carcinoma, he may benefit from seeing Dr. Burr Medico rather than me.  She is expert in this area.  Thank you.

## 2020-03-09 NOTE — Telephone Encounter (Signed)
Adam Bass,  Please schedule this pt to see me next week, I have Wednesday 3pm or Thursday 2pm open, thanks   Truitt Merle MD

## 2020-03-10 NOTE — Progress Notes (Signed)
Spoke with patient regarding referral from Dr. Valeta Harms & Dr. Isidore Moos, originally he was scheduled to see Dr. Julien Nordmann however per Dr. Julien Nordmann feels patient needs to be seen by Dr. Burr Medico.  I have scheduled him for Wednesday 12/1 at 3 pm/ labs at 2:15.  He is aware of our location and he knows he can bring one person with him to this consult.  He knows that the original appointment for Monday 11/30 has been cancelled.

## 2020-03-13 ENCOUNTER — Other Ambulatory Visit: Payer: Medicare Other

## 2020-03-13 ENCOUNTER — Inpatient Hospital Stay: Payer: Medicare Other | Admitting: Internal Medicine

## 2020-03-15 ENCOUNTER — Encounter: Payer: Self-pay | Admitting: Hematology

## 2020-03-15 ENCOUNTER — Inpatient Hospital Stay: Payer: Medicare Other

## 2020-03-15 ENCOUNTER — Inpatient Hospital Stay: Payer: Medicare Other | Attending: Hematology | Admitting: Hematology

## 2020-03-15 ENCOUNTER — Other Ambulatory Visit: Payer: Self-pay

## 2020-03-15 VITALS — BP 109/64 | HR 69 | Resp 18 | Ht 69.0 in | Wt 134.4 lb

## 2020-03-15 DIAGNOSIS — C779 Secondary and unspecified malignant neoplasm of lymph node, unspecified: Secondary | ICD-10-CM | POA: Diagnosis not present

## 2020-03-15 DIAGNOSIS — C778 Secondary and unspecified malignant neoplasm of lymph nodes of multiple regions: Secondary | ICD-10-CM | POA: Diagnosis not present

## 2020-03-15 DIAGNOSIS — R918 Other nonspecific abnormal finding of lung field: Secondary | ICD-10-CM | POA: Insufficient documentation

## 2020-03-15 DIAGNOSIS — C3432 Malignant neoplasm of lower lobe, left bronchus or lung: Secondary | ICD-10-CM

## 2020-03-15 DIAGNOSIS — Z9981 Dependence on supplemental oxygen: Secondary | ICD-10-CM | POA: Diagnosis not present

## 2020-03-15 DIAGNOSIS — F17210 Nicotine dependence, cigarettes, uncomplicated: Secondary | ICD-10-CM | POA: Diagnosis not present

## 2020-03-15 DIAGNOSIS — C220 Liver cell carcinoma: Secondary | ICD-10-CM | POA: Diagnosis not present

## 2020-03-15 DIAGNOSIS — Z95828 Presence of other vascular implants and grafts: Secondary | ICD-10-CM | POA: Diagnosis not present

## 2020-03-15 DIAGNOSIS — Z803 Family history of malignant neoplasm of breast: Secondary | ICD-10-CM | POA: Insufficient documentation

## 2020-03-15 DIAGNOSIS — I252 Old myocardial infarction: Secondary | ICD-10-CM | POA: Insufficient documentation

## 2020-03-15 DIAGNOSIS — J449 Chronic obstructive pulmonary disease, unspecified: Secondary | ICD-10-CM | POA: Insufficient documentation

## 2020-03-15 DIAGNOSIS — C3492 Malignant neoplasm of unspecified part of left bronchus or lung: Secondary | ICD-10-CM

## 2020-03-15 DIAGNOSIS — R059 Cough, unspecified: Secondary | ICD-10-CM | POA: Insufficient documentation

## 2020-03-15 DIAGNOSIS — R590 Localized enlarged lymph nodes: Secondary | ICD-10-CM

## 2020-03-15 LAB — CMP (CANCER CENTER ONLY)
ALT: 37 U/L (ref 0–44)
AST: 36 U/L (ref 15–41)
Albumin: 3.7 g/dL (ref 3.5–5.0)
Alkaline Phosphatase: 130 U/L — ABNORMAL HIGH (ref 38–126)
Anion gap: 8 (ref 5–15)
BUN: 13 mg/dL (ref 8–23)
CO2: 34 mmol/L — ABNORMAL HIGH (ref 22–32)
Calcium: 9.8 mg/dL (ref 8.9–10.3)
Chloride: 98 mmol/L (ref 98–111)
Creatinine: 0.67 mg/dL (ref 0.61–1.24)
GFR, Estimated: >60 mL/min (ref >60)
Glucose, Bld: 103 mg/dL — ABNORMAL HIGH (ref 70–99)
Potassium: 4.1 mmol/L (ref 3.5–5.1)
Sodium: 140 mmol/L (ref 135–145)
Total Bilirubin: 0.5 mg/dL (ref 0.3–1.2)
Total Protein: 7.5 g/dL (ref 6.5–8.1)

## 2020-03-15 LAB — CBC WITH DIFFERENTIAL (CANCER CENTER ONLY)
Abs Immature Granulocytes: 0.04 10*3/uL (ref 0.00–0.07)
Basophils Absolute: 0 10*3/uL (ref 0.0–0.1)
Basophils Relative: 0 %
Eosinophils Absolute: 0.2 10*3/uL (ref 0.0–0.5)
Eosinophils Relative: 2 %
HCT: 38.4 % — ABNORMAL LOW (ref 39.0–52.0)
Hemoglobin: 12.6 g/dL — ABNORMAL LOW (ref 13.0–17.0)
Immature Granulocytes: 0 %
Lymphocytes Relative: 24 %
Lymphs Abs: 2.9 10*3/uL (ref 0.7–4.0)
MCH: 33.6 pg (ref 26.0–34.0)
MCHC: 32.8 g/dL (ref 30.0–36.0)
MCV: 102.4 fL — ABNORMAL HIGH (ref 80.0–100.0)
Monocytes Absolute: 0.9 10*3/uL (ref 0.1–1.0)
Monocytes Relative: 8 %
Neutro Abs: 7.7 10*3/uL (ref 1.7–7.7)
Neutrophils Relative %: 66 %
Platelet Count: 310 10*3/uL (ref 150–400)
RBC: 3.75 MIL/uL — ABNORMAL LOW (ref 4.22–5.81)
RDW: 11.3 % — ABNORMAL LOW (ref 11.5–15.5)
WBC Count: 11.8 10*3/uL — ABNORMAL HIGH (ref 4.0–10.5)
nRBC: 0 % (ref 0.0–0.2)

## 2020-03-15 NOTE — Progress Notes (Addendum)
[place]   [phone] [phone]   Clinic New Consult Note   Patient Care Team: [place], [place] as PCP - General (Family Medicine) Harl Bowie, Alphonse Guild, MD as PCP - Cardiology (Cardiology) Alba Destine, MD as Rounding Team (Internal Medicine) Truitt Merle, MD as Consulting Physician (Oncology)  Date of Service:  03/15/2020   CHIEF COMPLAINTS/PURPOSE OF CONSULTATION:  Left Lung cancer Recurrence   REFERRING PHYSICIAN:  Dr Isidore Moos   Oncology History Overview Note  Cancer Staging Primary cancer of left lower lobe of lung [facility]) Staging form: Lung, AJCC 8th Edition - Clinical stage from 04/30/2017: Stage IA2 (cT1b, cN0, cM0) - Signed by Eppie Gibson, MD on 04/30/2017    Primary cancer of left lower lobe of lung ([place])  09/05/2016 Imaging   CT Chest: Mild apical scarring noted in the right lung.  Diffuse emphysematous changes bilaterally.  Irregular spiculated nodule in the left upper lobe measuring 2.1 cm.   10/02/2016 PET scan   No hypermetabolic lymphadenopathy in the neck.  No hypermetabolic mediastinal, hilar, or axillary lymph nodes.  Spiculated nodule in the left upper lobe measuring 1.9 cm, SUV max 4.5.  0.8 cm nodule in the left lower lobe with SUV max of 1.7.   no evidence of hypermetabolic lesions in the liver, adrenal glands, spleen, or pancreas.  No abdominal or pelvic lymphadenopathy.  Liver margin appears somewhat irregular with possible cirrhosis.   10/22/2016 Pathology Results   LtUL Nodule Bx: Lung with necrosis, inflammation, and focal calcifications.  No evidence of malignancy.   04/02/2017 PET scan   Left lower lobe lesion measuring 1.3 cm up from 0.8 cm previously, SUV max of 2.9 up from 1.7 in the past.  Left upper lobe nodule SUV max of 3.6 down from 4.5 previously, measuring 2.2 cm, 2.1 cm previously.  Bilateral hypermetabolic carotid lesions noted.   04/10/2017 Initial Diagnosis   Primary cancer of left  lower lobe of lung ([place])   04/10/2017 Pathology Results   LtLL Lung Bx: Positive for presence of malignant cells, consistent with squamous cell carcinoma. IHC -- positive for CK5/6, p63 & negative for TTF-1 & Napsin-A   05/2017 - 06/09/2017 Radiation Therapy   3 doses of SBRT to Lung by Dr Isidore Moos   08/03/2019 Imaging   MRI Abdomen 08/03/19 IMPRESSION: Limited evaluation due to severe motion degradation.   Cirrhosis.  Stable ablation zone in segment 4A.   11 mm enhancing lesion in segment 8 (LI-RADS 3). Attention on follow-up is suggested. Given degree of motion degradation on the current study, consider liver protocol CT (with/without contrast) in 6 months.   Additional stable ancillary findings as above.IMPRESSION: Limited evaluation due to severe motion degradation.   Cirrhosis.  Stable ablation zone in segment 4A.   11 mm enhancing lesion in segment 8 (LI-RADS 3). Attention on follow-up is suggested. Given degree of motion degradation on the current study, consider liver protocol CT (with/without contrast) in 6 months.   Additional stable ancillary findings as above.   Hepatocellular carcinoma ([place])  06/10/2018 Initial Diagnosis   Hepatocellular carcinoma ([place])   06/10/2018 Procedure   Liver Ablation by Dr Kathlene Cote    10/02/2018 Imaging   MRI Abdomen  IMPRESSION: 1. No evidence hepatocellular carcinoma recurrence in the LEFT hepatic lobe at thermal ablation site. 2. Marginal enhancing lesion in the superior RIGHT hepatic lobe (segment 8) without washout is slightly enlarged from comparison exam. LiRAD- 3. Recommend close attention on follow- 3. Additional smaller enhancing lesions  LEFT and RIGHT hepatic show arterial enhancement but no delayed visibility. Potential dysplastic nodules.     08/03/2019 Imaging   MRI Abdomen  IMPRESSION: Limited evaluation due to severe motion degradation.   Cirrhosis.  Stable ablation zone in segment 4A.   11 mm enhancing lesion in  segment 8 (LI-RADS 3). Attention on follow-up is suggested. Given degree of motion degradation on the current study, consider liver protocol CT (with/without contrast) in 6 months.   Additional stable ancillary findings as above.     01/19/2020 Imaging   MRI Liver 01/19/20 IMPRESSION: 1. New or progressive 3.0 by 2.1 cm left lower lobe lung mass with adjacent 2.3 by 1.3 cm left lower lobe nodule. Appearance is highly concerning for malignancy and chest CT with contrast is recommended for better characterization. 2. LI-RADS category LR-5 (definite hepatocellular carcinoma) central in the right hepatic lobe. 3. Several LI-RADS category LR 3 lesions are present scattered in the liver. 4. Prior ablation site in segment 4 adjacent to the falciform ligament. 5. Descending colon diverticulosis. 6. Dextroconvex lumbar scoliosis with lumbar spondylosis and degenerative disc disease. 7.  Aortic Atherosclerosis (ICD10-I70.0).   NSCLC of left lung ([place])  01/19/2020 Imaging   MRI Liver 01/19/20 IMPRESSION: 1. New or progressive 3.0 by 2.1 cm left lower lobe lung mass with adjacent 2.3 by 1.3 cm left lower lobe nodule. Appearance is highly concerning for malignancy and chest CT with contrast is recommended for better characterization. 2. LI-RADS category LR-5 (definite hepatocellular carcinoma) central in the right hepatic lobe. 3. Several LI-RADS category LR 3 lesions are present scattered in the liver. 4. Prior ablation site in segment 4 adjacent to the falciform ligament. 5. Descending colon diverticulosis. 6. Dextroconvex lumbar scoliosis with lumbar spondylosis and degenerative disc disease. 7.  Aortic Atherosclerosis (ICD10-I70.0).   01/28/2020 Imaging   CT Chest 01/28/20  IMPRESSION: 1. New mass lesion and adjacent nodule in the LEFT lower lobe consistent with lung cancer recurrence. 2. New LEFT hilar lymph nodes consistent with nodal metastasis. 3. New thick-walled cavitary  lesion in the RIGHT upper lobe is concerning for metachronous bronchogenic carcinoma versus metastasis. 4. Additional nodules in the periphery of the RIGHT lower lobe are concerning. 5. Consider FDG PET scan for further evaluation. 6. Hepatocellular carcinoma lesions in the liver described on comparison MRI   02/21/2020 PET scan   PET 02/21/20 IMPRESSION: 1. Left lower lobe pulmonary mass is markedly hypermetabolic, consistent with malignancy. Hypermetabolic metastases are seen in the left hilum and paraesophageal mediastinum. 2. Hypermetabolic cavitary lesion in the right apex is highly suspicious for additional site of malignancy, potentially metastatic disease or metachronous primary. 3. Small area of a nodular soft tissue in the posterior left apex shows low level FDG uptake. Uptake in this region may be related to scarring from prior radiation therapy. Close attention on follow-up recommended. 4. No focal hepatic hypermetabolism in this individual with documented LI-RADS category 5 lesion in the central right liver on recent MRI. 5. Small inferior bilateral parotid nodules show low level hypermetabolism, stable since 2018, but indeterminate by PET imaging today. 6. 3.2 cm diameter infrarenal abdominal aorta. Recommend follow-up ultrasound every 3 years. This recommendation follows ACR consensus guidelines: White Paper of the ACR Incidental Findings Committee II on Vascular Findings. J Am Coll Radiol 2013; 10:789-794. 7.  Emphysema. (ICD10-J43.9)   02/29/2020 Pathology Results   Video Bronchoscopy biopsy 02/29/20   FINAL MICROSCOPIC DIAGNOSIS:   A. LUNG, LLL, TARGET 1, BRUSHING:  - Malignant cells present  -  Non-small cell carcinoma   B. LUNG, LLL, TARGET 1, NEEDLE ASPIRATION:  - Malignant cells present  - Non-small cell carcinoma  - See comment   COMMENT:  Only relatively few malignant cells are present on the cellblock.  Immunostains for p40 and TTF-1 are  negative by immunostain for CK 5/6  shows only focal staining within the malignant cells.  This  immunoprofile is not definitive but suggestive of squamous cell  carcinoma.   Dr. Valeta Harms was notified on 03/03/2020.   C. LUNG, LLL, LAVAGE:  FINAL MICROSCOPIC DIAGNOSIS:  - Malignant cells consistent with non-small cell carcinoma   D. LUNG, RUL, TARGET 2, BRUSHING:  - No malignant cells identified   E. LUNG, RUL, TARGET 2, NEEDLE ASPIRATION:  - No malignant cells identified   F. LUNG, RUL, LAVAGE:  FINAL MICROSCOPIC DIAGNOSIS:  - No malignant cells identified   G. LYMPH NODE, 11, FINE NEEDLE ASPIRATION:  - Malignant cells present consistent with non-small cell carcinoma   H. LYMPH NODE, 7, FINE NEEDLE ASPIRATION:  - Malignant cells present consistent with non-small cell carcinoma        03/15/2020 Initial Diagnosis   NSCLC of left lung ([place])   03/15/2020 Cancer Staging   Staging form: Lung, AJCC 8th Edition - Clinical stage from 03/15/2020: Stage IVA (cT2, cN2, cM1a) - Signed by Truitt Merle, MD on 03/15/2020      HISTORY OF PRESENTING ILLNESS:  Adam Bass 69 y.o. Bass is a here because of lung cancer recurrence. The patient was referred by Dr Valeta Harms. The patient presents to the clinic today accompanied by his daughter.  His initial left lung cancer was found during COPD evaluation and diagnosed 03/2017. He was treated with 3 rounds of SBRT by Dr Isidore Moos. His lung lesions decreased but not completely gone. He was followed by Dr Delton Coombes since with scans but lost follow up. For evaluation of his Liver cancer he was found to have liver lesion and new left lung lesions. Bronchoscopy showed this to be lung cancer recurrence.  He notes productive cough with white phlegm for some time. He was about 20 pounds in 2 years. He denies any pain. His energy level is lower about 80% and not moving more than 50% of the time.   Socially he is divorced with 2 Bass daughters. He lives in  [place], he does plan to get treated back there. He was retired from work in Architect. He lives alone and able to take care of himself. He will break as needed. He can cook and do some chores. He lives in apartment on second level and plans to move to first floor soon. He has reduced his smoking from 1.5ppd to 1/2 ppd now and uses nicotine patch.   He has COPD, H/o CAD with MI with sten placed and skin cancer.  I reviewed his medication list with him. He uses Albuterol as needed. His sister had breast cancer.    REVIEW OF SYSTEMS:    Constitutional: Denies fevers, chills or abnormal night sweats (+) weight loss  Eyes: Denies blurriness of vision, double vision or watery eyes Ears, nose, mouth, throat, and face: Denies mucositis or sore throat Respiratory: (+) COPD on supplemental oxygen (+) productive cough with white phlegm.  Cardiovascular: Denies palpitation, chest discomfort or lower extremity swelling Gastrointestinal:  Denies nausea, heartburn or change in bowel habits Skin: Denies abnormal skin rashes Lymphatics: Denies new lymphadenopathy or easy bruising Neurological:Denies numbness, tingling or new weaknesses Behavioral/Psych: Mood is stable, no  new changes  All other systems were reviewed with the patient and are negative.  MEDICAL HISTORY:  Past Medical History:  Diagnosis Date  . Anticoagulant long-term use    brillinta  . Bilateral lower extremity edema    feet, noticed since discharge from hospital admission 05-18-2018, "i was laid up in the bed for five days"   . CAD (coronary artery disease) cardiologist-- dr j. branch   hx STEMI 11-23-2017,  s/p  cardiac cath with Thrombectomy, PCI and DES x1 to midRCA, and other nonobstrucitve CAD involving pLAD, OM1, dRCA, posterior atrio, LVEF 45-50%,  LVEDP 41mHg  . COPD (chronic obstructive pulmonary disease) with emphysema ([facility]    pulmonology-- dr wert-- GOLD III mix (pt still smokes),  last exacerbation -due to the flu ,  [facility]  . DOE (dyspnea on exertion)   . Ectatic abdominal aorta ([place]    MRI 11/ 2019  . Hepatitis C GI-- dr rLaural Golden  dx 02-02-2018--- currently treated on antiviral medication  . Hepatocellular carcinoma ([place]   . History of radiation therapy 06/03/17- 06/09/17   Left Lung treated to 54 Gy with 3 fx of 18 Gy. SBRT  . History of skin cancer    04/ 2017 excision from face  . History of ST elevation myocardial infarction (STEMI) 11/23/2017   inferior wall-- s/p cardiac cath w/ thrombectomy, PCI, and DES  . HLD (hyperlipidemia)   . Hypertension   . Myocardial infarction ([place] [place]   . Primary squamous cell carcinoma of left lung ([facility] oncologist-- dr sIsidore Moos  dx 02/ 2018-- Stage IA2 Left lower lung,  s/p  Stereotactic radiation completed 06-09-2017  . Productive cough    per pt mostly clear  . Requires supplemental oxygen    06-09-2018 currently pt is using a portable O2 as needed and at night  . S/P drug eluting coronary stent placement 11/23/2017   DES x1 to midRCA  . Urinary incontinence    wear depends  . Wears dentures    fuller upper,  lower partial  . Wears glasses     SURGICAL HISTORY: Past Surgical History:  Procedure Laterality Date  . BRONCHIAL BIOPSY  02/29/2020   Procedure: BRONCHIAL BIOPSIES;  Surgeon: IGarner Nash DO;  [facility];  Service: Pulmonary;;  . BRONCHIAL BRUSHINGS  02/29/2020   Procedure: BRONCHIAL BRUSHINGS;  Surgeon: IGarner Nash DO;  [facility];  Service: Pulmonary;;  . BRONCHIAL NEEDLE ASPIRATION BIOPSY  02/29/2020   Procedure: BRONCHIAL NEEDLE ASPIRATION BIOPSIES;  Surgeon: IGarner Nash DO;  [facility];  Service: Pulmonary;;  . BRONCHIAL WASHINGS  02/29/2020   Procedure: BRONCHIAL WASHINGS;  Surgeon: IGarner Nash DO;  [facility]  Service: Pulmonary;;  . COLONOSCOPY N/A 08/27/2012   Procedure: COLONOSCOPY;  Surgeon: NRogene Houston MD;  [facility];  Service: Endoscopy;  Laterality: N/A;  1200  . CORONARY STENT INTERVENTION N/A 11/23/2017   Procedure: CORONARY STENT INTERVENTION;  Surgeon: VJettie Booze MD;  [facility];  Service: Cardiovascular;  Laterality: N/A;  . ENDOBRONCHIAL ULTRASOUND N/A 02/29/2020   Procedure: ENDOBRONCHIAL ULTRASOUND;  Surgeon: IGarner Nash DO;  [facility]  Service: Pulmonary;  Laterality: N/A;  . FINE NEEDLE ASPIRATION  02/29/2020   Procedure: FINE NEEDLE ASPIRATION;  Surgeon: IGarner Nash DO;  [facility];  Service: Pulmonary;;  . IR RADIOLOGIST EVAL & MGMT  03/04/2018  . IR RADIOLOGIST EVAL & MGMT  10/06/2018  . IR RADIOLOGIST EVAL & MGMT  01/26/2019  . IR RADIOLOGIST EVAL & MGMT  08/11/2019  . IR RADIOLOGIST EVAL & MGMT  01/25/2020  . LEFT HEART CATH AND CORONARY ANGIOGRAPHY N/A 11/23/2017   Procedure: LEFT HEART CATH AND CORONARY ANGIOGRAPHY;  Surgeon: Jettie Booze, MD;  Location: [place] [facility];  Service: Cardiovascular;  Laterality: N/A;  . RADIOFREQUENCY ABLATION N/A 06/10/2018   Procedure: MICROWAVE THERMAL ABLATION LIVER;  Surgeon: Aletta Edouard, MD;  Location: [facility];  Service: Anesthesiology;  Laterality: N/A;  . VIDEO BRONCHOSCOPY WITH ENDOBRONCHIAL NAVIGATION Bilateral 02/29/2020   Procedure: VIDEO BRONCHOSCOPY WITH ENDOBRONCHIAL NAVIGATION;  Surgeon: Garner Nash, DO;  Location: [place];  Service: Pulmonary;  Laterality: Bilateral;    SOCIAL HISTORY: Social History   Socioeconomic History  . Marital status: Divorced    Spouse name: Not on file  . Number of children: 2  . Years of education: Not on file  . Highest education level: Not on file  Occupational History  . Not on file  Tobacco Use  . Smoking status: Current Every Day Smoker    Packs/day: 0.75    Years: 50.00    Pack years: 37.50    Types: Cigars  . Smokeless tobacco: Never Used  . Tobacco comment: . He is smoking about 5-10 cigarettes  daily.   Vaping Use  . Vaping Use: Former  . Quit date: 10/07/2017  Substance and Sexual Activity  . Alcohol use: Yes    Alcohol/week: 8.0 standard drinks    Types: 8 Cans of beer per week    Comment: 8 beers weekly, give or take   . Drug use: No  . Sexual activity: Not on file  Other Topics Concern  . Not on file  Social History Narrative  . Not on file   Social Determinants of Health   Financial Resource Strain:   . Difficulty of Paying Living Expenses: Not on file  Food Insecurity:   . Worried About Charity fundraiser in the Last Year: Not on file  . Ran Out of Food in the Last Year: Not on file  Transportation Needs:   . Lack of Transportation (Medical): Not on file  . Lack of Transportation (Non-Medical): Not on file  Physical Activity:   . Days of Exercise per Week: Not on file  . Minutes of Exercise per Session: Not on file  Stress:   . Feeling of Stress : Not on file  Social Connections:   . Frequency of Communication with Friends and Family: Not on file  . Frequency of Social Gatherings with Friends and Family: Not on file  . Attends Religious Services: Not on file  . Active Member of Clubs or Organizations: Not on file  . Attends Archivist Meetings: Not on file  . Marital Status: Not on file  Intimate Partner Violence:   . Fear of Current or Ex-Partner: Not on file  . Emotionally Abused: Not on file  . Physically Abused: Not on file  . Sexually Abused: Not on file    FAMILY HISTORY: Family History  Problem Relation Age of Onset  . Breast cancer Sister   . Colon cancer Neg Hx     ALLERGIES:  is allergic to bee venom.  MEDICATIONS:  Current Outpatient Medications  Medication Sig Dispense Refill  . albuterol (PROAIR HFA) 108 (90 Base) MCG/ACT inhaler Inhale 2 puffs into the lungs every 6 (six) hours as needed for wheezing.     Marland Kitchen  ALPRAZolam (XANAX) 0.5 MG tablet Take 0.25-0.5 mg by mouth 3 (three) times daily as needed for anxiety.    .  Ascorbic Acid (VITAMIN C PO) Take 1 tablet by mouth daily.    Marland Kitchen aspirin EC 81 MG tablet Take 81 mg by mouth daily.     Marland Kitchen atorvastatin (LIPITOR) 20 MG tablet Take 1 tablet (20 mg total) by mouth daily. 90 tablet 3  . b complex vitamins capsule Take 1 capsule by mouth daily.    . bisoprolol (ZEBETA) 5 MG tablet TAKE 1/2 TABLET BY MOUTH EVERY DAY (Patient taking differently: Take 2.5 mg by mouth daily. ) 45 tablet 3  . Cholecalciferol (VITAMIN D3 PO) Take 1 capsule by mouth daily.    Marland Kitchen esomeprazole (NEXIUM) 40 MG capsule Take 40 mg by mouth daily.     Marland Kitchen ezetimibe (ZETIA) 10 MG tablet Take 1 tablet (10 mg total) by mouth daily. 90 tablet 1  . famotidine-calcium carbonate-magnesium hydroxide (PEPCID COMPLETE) 10-800-165 MG chewable tablet Chew 1 tablet by mouth daily as needed (acid reflux).    . Fluticasone-Umeclidin-Vilant (TRELEGY ELLIPTA) 100-62.5-25 MCG/INH AEPB INHALE 1 PUFF INTO THE LUNGS EVERY DAY (Patient taking differently: Inhale 1 puff into the lungs daily. ) 60 each 0  . hydrocortisone cream 1 % Apply to affected area 2 times daily (Patient taking differently: Apply 1 application topically 2 (two) times daily as needed for itching. ) 15 g 0  . montelukast (SINGULAIR) 10 MG tablet One at bedtime every night (Patient taking differently: Take 10 mg by mouth at bedtime. ) 30 tablet 11  . Multiple Vitamins-Minerals (MULTIVITAMIN WITH MINERALS) tablet Take 1 tablet by mouth daily.    . Multiple Vitamins-Minerals (ZINC PO) Take 1 tablet by mouth daily.    . nicotine (NICODERM CQ - DOSED IN MG/24 HOURS) 21 mg/24hr patch Place 21 mg onto the skin daily.    . nitroGLYCERIN (NITROSTAT) 0.4 MG SL tablet PLACE 1 TABLET UNDER THE TONGUE EVERY 5 MINUTES AS NEEDED. FOLLOW MD DIRECTIONS FOR WHEN TO CALL 911 (Patient taking differently: Place 0.4 mg under the tongue every 5 (five) minutes as needed for chest pain. ) 25 tablet 3  . OXYGEN Inhale into the lungs as needed. 4 Liters supplemental o2 , as needed  "when im up moving around"    . oxymetazoline (AFRIN) 0.05 % nasal spray Place 1 spray into both nostrils 2 (two) times daily as needed for congestion.     . Tetrahydrozoline HCl (VISINE OP) Place 1 drop into both eyes daily as needed (irritation).     No current facility-administered medications for this visit.    PHYSICAL EXAMINATION: ECOG PERFORMANCE STATUS: 3 - Symptomatic, >50% confined to bed  Vitals:   03/15/20 1447  BP: 109/64  Pulse: 69  Resp: 18  SpO2: 99%   Filed Weights   03/15/20 1447  Weight: 134 lb 6.4 oz (61 kg)   Due to COVID19 we will limit examination to appearance. Patient had no complaints.  GENERAL:alert, no distress and comfortable SKIN: skin color normal, no rashes or significant lesions EYES: normal, Conjunctiva are pink and non-injected, sclera clear  NEURO: alert & oriented x 3 with fluent speech   LABORATORY DATA:  I have reviewed the data as listed CBC Latest Ref Rng & Units 03/15/2020 02/29/2020 01/04/2020  WBC 4.0 - 10.5 K/uL 11.8(H) 11.1(H) 10.9(H)  Hemoglobin 13.0 - 17.0 g/dL 12.6(L) 12.5(L) 14.1  Hematocrit 39 - 52 % 38.4(L) 38.7(L) 42.5  Platelets 150 - 400  K/uL 310 272 299    CMP Latest Ref Rng & Units 03/15/2020 02/29/2020 01/04/2020  Glucose 70 - 99 mg/dL 103(H) 92 115(H)  BUN 8 - 23 mg/dL _0 Creatinine 0.61 - 1.24 mg/dL 0.67 0.53(L) 0.58(L)  Sodium 135 - 145 mmol/L 140 139 138  Potassium 3.5 - 5.1 mmol/L 4.1 4.3 4.1  Chloride 98 - 111 mmol/L 98 97(L) 96(L)  CO2 22 - 32 mmol/L 34(H) 30 37(H)  Calcium 8.9 - 10.3 mg/dL 9.8 9.2 9.3  Total Protein 6.5 - 8.1 g/dL 7.5 6.9 6.9  Total Bilirubin 0.3 - 1.2 mg/dL 0.5 0.6 0.6  Alkaline Phos 38 - 126 U/L 130(H) 112 -  AST 15 - 41 U/L 36 39 34  ALT 0 - 44 U/L 37 47(H) 41     RADIOGRAPHIC STUDIES: I have personally reviewed the radiological images as listed and agreed with the findings in the report. NM PET Image Restag (PS) Skull Base To Thigh  Result Date: 02/22/2020 CLINICAL DATA:   Subsequent treatment strategy for non-small-cell lung cancer. EXAM: NUCLEAR MEDICINE PET SKULL BASE TO THIGH TECHNIQUE: 7.3 mCi F-18 FDG was injected intravenously. Full-ring PET imaging was performed from the skull base to thigh after the radiotracer. CT data was obtained and used for attenuation correction and anatomic localization. Fasting blood glucose: 95 mg/dl COMPARISON:  Chest CT 01/28/2020.  PET-CT 04/02/2017. FINDINGS: Mediastinal blood pool activity: SUV max 2.0 Liver activity: SUV max NA NECK: Stable hypermetabolic nodules in the inferior parotid glands comparing back to 04/02/2017. Incidental CT findings: None. CHEST: Cavitary right apical lesion is progressive in the interval with SUV max = 9.0. Irregular nodular parenchymal lesion in the posterior left apex shows low level hypermetabolism with SUV max = 2.3. Multi lobular left lower lobe lesion shows marked hypermetabolism with SUV max = 12.8. This is associated with hypermetabolic lymphadenopathy in the left hilum ( SUV max = 11.1. 7 mm short axis paraesophageal node on 113/3 shows low level hypermetabolism with SUV max = 4.3. Hypermetabolism mapping to the adjacent esophagus near this lymph node is probably misregistration as there is no evidence for esophageal wall thickening or mass lesion at this location. Incidental CT findings: Coronary artery calcification is evident. Atherosclerotic calcification is noted in the wall of the thoracic aorta. Centrilobular emphysema noted. ABDOMEN/PELVIS: No abnormal hypermetabolic activity within the liver, pancreas, adrenal glands, or spleen. No hypermetabolic lymph nodes in the abdomen or pelvis. Incidental CT findings: Ablation defect again identified in segment IV of the liver. Recent MRI of 01/19/2020 documented a 1.7 x 1.3 cm right hepatic lesion consistent with hepatocellular carcinoma. No hypermetabolism in this region on PET imaging today. 12 mm hyperattenuating lesion in the interpolar right kidney  compatible with Bosniak II cyst characterized on recent MRI. Benign left renal cysts also noted. Infrarenal abdominal aorta measures up to 3.2 cm diameter. SKELETON: No focal hypermetabolic activity to suggest skeletal metastasis. Incidental CT findings: none IMPRESSION: 1. Left lower lobe pulmonary mass is markedly hypermetabolic, consistent with malignancy. Hypermetabolic metastases are seen in the left hilum and paraesophageal mediastinum. 2. Hypermetabolic cavitary lesion in the right apex is highly suspicious for additional site of malignancy, potentially metastatic disease or metachronous primary. 3. Small area of a nodular soft tissue in the posterior left apex shows low level FDG uptake. Uptake in this region may be related to scarring from prior radiation therapy. Close attention on follow-up recommended. 4. No focal hepatic hypermetabolism in this individual with documented LI-RADS category 5  lesion in the central right liver on recent MRI. 5. Small inferior bilateral parotid nodules show low level hypermetabolism, stable since 2018, but indeterminate by PET imaging today. 6. 3.2 cm diameter infrarenal abdominal aorta. Recommend follow-up ultrasound every 3 years. This recommendation follows ACR consensus guidelines: White Paper of the ACR Incidental Findings Committee II on Vascular Findings. J Am Coll Radiol 2013; 10:789-794. 7.  Emphysema. (ZJI96-V89.9) Electronically Signed   By: Misty Stanley M.D.   On: 02/22/2020 12:52   DG CHEST PORT 1 VIEW  Result Date: 02/29/2020 CLINICAL DATA:  Post bronchoscopy with biopsy EXAM: PORTABLE CHEST 1 VIEW COMPARISON:  Chest CT 01/28/2020 FINDINGS: There is hyperinflation of the lungs compatible with COPD. Airspace opacity seen in the right upper lobe and left lower lobe, likely related to bronchoscopy. No pneumothorax or effusions. Heart is normal size. Aortic atherosclerosis. IMPRESSION: Airspace disease now present in the right upper lobe and left lower lobe,  likely related to bronchoscopy. No pneumothorax. COPD. Electronically Signed   By: Rolm Baptise M.D.   On: 02/29/2020 17:04   DG C-ARM BRONCHOSCOPY  Result Date: 02/29/2020 C-ARM BRONCHOSCOPY: Fluoroscopy was utilized by the requesting physician.  No radiographic interpretation.    ASSESSMENT & PLAN:  RASHIDI LOH is a 69 y.o. Caucasian Bass with a history of CAD, COPD, Hep C, HCC, H/o skin cancer removed from face 07/2015, HLD   1. 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 He was treated with target SBRT by Dr Isidore Moos in 05/2017. -He has been followed since by Dr. Isidore Moos and also saw Dr Delton Coombes in 2019. His left lung nodule did get much smaller with stable residual scar tissue until early 2021 -With monitoring of his [place], he was found to have an increase in left lower lung mass.  -I personally reviewed his PET from 11/29/21with pt and his daughter, alone with the biopsy findings. PET scan showed large hypermetabolic LLL lung mass,with  Hilar and mediastinal adenopathy, both biopsy confirmed SCC. Hypermetabolic cavitary lesion in the right apex is highly suspicious for additional site of malignancy or metastasis, but biopsy was negative.  -I discussed given mediastinal LN involvement and possible right lung metastasis or second primary, he has at least stage III lung cancer, or likely stage IVA disease. I discussed his cancer may not be curable, but still treatable.  -I discussed standard treatment with concurrent chemotherapy and Radiation. Given his comorbidities and poor performance status, I suggest sequential treatment with Radiation first, followed by Chemotherapy if he can tolerate. Afterward I recommend adjuvant immunotherapy for one year (which could also help his liver cancer).  -I requested PDL-1 markers on tumor to see  if may be able to do immunotherapy alone as initial treatment. I also requested Genomic testing with [employer].  -we discussed the possibility of targeted therapy is low in [facility] -Labs reviewed today, CBC and CMP WNL except WBC 11.8, Hg 12.6, BG 103, Alk phos 130.  -will discuss his case in tumor board tomorrow morning  -F/u open    2. COPD, Smoking Cessation  -He has been on continuous supplemental oxygen 4L and occasionally 2L since 04/2018.  -Uses Albuterol as needed and uses Neb treatment  -Continue to F/u with pulmonologist.  -He has long history of smoking. He has reduced smoking form 1.5ppd to 1/2 ppd  currently. He uses Nicotine patch to help him reduce and quit. I discussed and strongly encouraged complete smoking cessation.  -He does have ongoing cough with white phlegm.    3. H/o HCC, Hepatitis C -HCC Dx in 2020. Treated by Dr Kathlene Cote by Liver ablation on 06/10/18  -He was treated for Hep C in 2020 -MRI abdomen on 08/03/19 showed slow increase in known liver lesion. AFP has also increased lately. Dr Kathlene Cote feels if this is slow growing disease it is fine to observe for now while he proceeds with lung cancer treatment, which I agree. -we discussed that immunotherapy may benefit his Tripp   4. MI in 2019, CAD, HLD -Stent placed after MI  5. H/o skin cancer  -removed from face 07/2015 -He continues to f/u with dermatologist. Has not been seen since 2020 due to pandemic.    PLAN:  -[employer] today  -I have requested PD-L1 on his recent biopsy cytology sample -thoracic tumor board discussion tomorrow -pt will likely proceed with definitive radiation first, followed by sequential chemo and adjuvant immunotherapy    Orders Placed This Encounter  Procedures  . [employer]    Standing Status:   Future    Number of Occurrences:   1    Standing Expiration Date:   03/15/2021  . Ambulatory referral to Social Work    Referral Priority:   Routine    Referral Type:   Consultation     Referral Reason:   Specialty Services Required    Number of Visits Requested:   1    All questions were answered. The patient knows to call the clinic with any problems, questions or concerns. The total time spent in the appointment was 60 minutes.     Truitt Merle, MD 03/15/2020 9:41 PM  I, Joslyn Devon, am acting as scribe for Truitt Merle, MD.   I have reviewed the above documentation for accuracy and completeness, and I agree with the above.   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  Radiology showed the right reactive changes of his imaging is also concerning for infectious etiology. Plan to speak with Dr. Isidore Moos about his treatment plan.   Truitt Merle  03/16/2020

## 2020-03-16 ENCOUNTER — Other Ambulatory Visit: Payer: Self-pay | Admitting: *Deleted

## 2020-03-16 ENCOUNTER — Encounter: Payer: Self-pay | Admitting: *Deleted

## 2020-03-16 NOTE — Progress Notes (Signed)
The proposed treatment discussed in cancer conference 12/2 is for discussion purpose only and is not a binding recommendation.  The patient was not physically examined nor present for their treatment options. Therefore, final treatment plans cannot be decided.

## 2020-03-16 NOTE — Progress Notes (Signed)
Per cancer conference discussion, PDL 1 was requested on recent bx.  I contacted pathology dept to send PDL 1.

## 2020-03-16 NOTE — Addendum Note (Signed)
Addended by: Truitt Merle on: 03/16/2020 08:19 AM   Modules accepted: Orders

## 2020-03-17 ENCOUNTER — Telehealth: Payer: Self-pay

## 2020-03-17 ENCOUNTER — Encounter: Payer: Self-pay | Admitting: General Practice

## 2020-03-17 NOTE — Telephone Encounter (Signed)
Adam Bass, [name]'s daughter called for update on treatment plan.

## 2020-03-17 NOTE — Progress Notes (Signed)
[place] Psychosocial Distress Screening Clinical Social Work  Clinical Social Work was referred by distress screening protocol.  The patient scored a 7 on the Psychosocial Distress Thermometer which indicates moderate distress. Clinical Social Worker contacted patient by phone to assess for distress and other psychosocial needs. No answer, left VM w brief information about [place], my contact information and encouragement to return the call at his convenience.  ONCBCN DISTRESS SCREENING 03/15/2020  Screening Type Initial Screening  Distress experienced in past week (1-10) 7  Practical problem type Housing;Transportation  Emotional problem type Depression;Nervousness/Anxiety;Adjusting to illness;Boredom;Feeling hopeless;Isolation/feeling alone  Information Concerns Type Lack of info about diagnosis;Lack of info about treatment;Lack of info about complementary therapy choices;Lack of info about maintaining fitness  Physical Problem type Sleep/insomnia;Getting around;Breathing;Tingling hands/feet;Skin dry/itchy;Swollen arms/legs  Referral to clinical social work Yes  Referral to dietition Yes    Clinical Social Worker follow up needed: Yes.    If yes, follow up plan:  Will call again  Beverely Pace, [place], [place] Worker Phone:  [phone]

## 2020-03-20 ENCOUNTER — Encounter: Payer: Self-pay | Admitting: *Deleted

## 2020-03-20 ENCOUNTER — Telehealth: Payer: Self-pay | Admitting: Hematology

## 2020-03-20 ENCOUNTER — Telehealth: Payer: Self-pay

## 2020-03-20 ENCOUNTER — Telehealth: Payer: Self-pay | Admitting: *Deleted

## 2020-03-20 NOTE — Telephone Encounter (Signed)
[facility] Patient Teacher, English as a foreign language completed.    The patient is insured through [employer].  All 3 Hep-C medications will need a PA.  We will continue to follow to see if copay assistance is needed.  Ileene Patrick, [facility] Patient [facility] for Infectious Disease Phone: [phone] Fax:  [phone]

## 2020-03-20 NOTE — Telephone Encounter (Signed)
No 12/1 los. No changes made to pt's schedule.  

## 2020-03-20 NOTE — Progress Notes (Signed)
[place] Psychosocial Distress Screening Clinical Social Work  Clinical Social Work was referred by distress screening protocol.  The patient scored a 7 on the Psychosocial Distress Thermometer which indicates moderate distress. Clinical Social Worker contacted patient by phone to assess for distress and other psychosocial needs.  Patient stated that her was "doing ok overall".  CSW provided education on CSW role on the support team at [facility].  Patient identified transportation as a potential concern.  CSW and patient discussed transportation resources.  Patient is scheduled to meet with CSW after [facility] tomorrow (12/7) to complete applications.       ONCBCN DISTRESS SCREENING 03/15/2020  Screening Type Initial Screening  Distress experienced in past week (1-10) 7  Practical problem type Housing;Transportation  Emotional problem type Depression;Nervousness/Anxiety;Adjusting to illness;Boredom;Feeling hopeless;Isolation/feeling alone  Information Concerns Type Lack of info about diagnosis;Lack of info about treatment;Lack of info about complementary therapy choices;Lack of info about maintaining fitness  Physical Problem type Sleep/insomnia;Getting around;Breathing;Tingling hands/feet;Skin dry/itchy;Swollen arms/legs  Referral to clinical social work Yes  Referral to dietition Yes    Johnnye Lana, MSW, LCSW, OSW-C Clinical Social Worker [place] [phone]

## 2020-03-20 NOTE — Telephone Encounter (Signed)
CALLED PATIENT TO INFORM OF FU AND SIM FOR 03-21-20, SPOKE WITH PATIENT AND HE IS AWARE OF THESE APPTS.

## 2020-03-20 NOTE — Telephone Encounter (Signed)
I called and spoke with pt's daughter. I have discussed his case with Dr. Isidore Moos and she will see him back to discuss radiation treatment first. I plan to see him back towards the end of his radiation.   Truitt Merle MD

## 2020-03-21 ENCOUNTER — Other Ambulatory Visit: Payer: Self-pay

## 2020-03-21 ENCOUNTER — Inpatient Hospital Stay: Payer: Medicare Other | Admitting: *Deleted

## 2020-03-21 ENCOUNTER — Ambulatory Visit
Admission: RE | Admit: 2020-03-21 | Discharge: 2020-03-21 | Disposition: A | Discharge status: Home / Self Care | Payer: Medicare Other | Source: Ambulatory Visit | Attending: Radiation Oncology | Admitting: Radiation Oncology

## 2020-03-21 VITALS — BP 120/63 | HR 66 | Temp 97.8°F | Resp 24 | Ht 69.0 in | Wt 134.8 lb

## 2020-03-21 DIAGNOSIS — C3492 Malignant neoplasm of unspecified part of left bronchus or lung: Secondary | ICD-10-CM

## 2020-03-21 DIAGNOSIS — C3432 Malignant neoplasm of lower lobe, left bronchus or lung: Secondary | ICD-10-CM

## 2020-03-21 DIAGNOSIS — Z510 Encounter for antineoplastic radiation therapy: Secondary | ICD-10-CM | POA: Diagnosis not present

## 2020-03-21 DIAGNOSIS — C220 Liver cell carcinoma: Secondary | ICD-10-CM | POA: Insufficient documentation

## 2020-03-21 MED ORDER — SODIUM CHLORIDE 0.9% FLUSH
10.0000 mL | Freq: Once | INTRAVENOUS | Status: AC
  Administered 2020-03-21: 10 mL via INTRAVENOUS

## 2020-03-21 NOTE — Progress Notes (Signed)
Has armband been applied?  Yes.    Does patient have an allergy to IV contrast dye?: No.   Has patient ever received premedication for IV contrast dye?: No.   Does patient take metformin?: No.  Date of lab work: March 15, 2020 BUN: 13 CR: 0.67  IV site: antecubital left, condition patent and no redness  Has IV site been added to flowsheet?  Yes.    BP 120/63 (BP Location: Left Arm, Patient Position: Sitting, Cuff Size: Normal)   Pulse 66   Temp 97.8 F (36.6 C)   Resp (!) 24   Ht 5\' 9"  (1.753 m)   Wt 134 lb 12.8 oz (61.1 kg)   SpO2 99%   PF (!) 4 L/min   BMI 19.91 kg/m

## 2020-03-21 NOTE — Progress Notes (Addendum)
Radiation Oncology         [phone] ________________________________  Name: Adam Bass MRN: [id number]  Date: 03/21/2020  DOB: 21-Jan-1951  Follow-Up Visit Note  Outpatient  CC: [facility], [facility]  Sinda Du, MD  Diagnosis and Prior Radiotherapy:    ICD-10-CM   1. NSCLC of left lung (HCC)  C34.92 sodium chloride flush (NS) 0.9 % injection 10 mL  2. Primary cancer of left lower lobe of lung ([place])  C34.32     CHIEF COMPLAINT: Here for follow-up and surveillance of lung cancer  Narrative:  The patient returns today for routine follow-up.   I have discussed him in detail with medical oncology and pulmonology. Biopsies of the left lower lobe and station 7 and hilum revealed malignant cells consistent with non-small cell carcinoma. The cells are consistent with but not definitive for squamous cell carcinoma. He has been referred to infectious disease as well because there is concern that he may also have MAC infection, specifically in the right upper lobe.  The consensus with medical oncology is that he should proceed with radiation therapy. Systemic therapy will be held for now. I have looked at his scans extensively with Dr. Valeta Harms of pulmonology and have a good understanding of where the positive biopsies were in correspondence with his PET scan.  BP 120/63 (BP Location: Left Arm, Patient Position: Sitting, Cuff Size: Normal)   Pulse 66   Temp 97.8 F (36.6 C)   Resp (!) 24   Ht _0  (1.753 m)   Wt 134 lb 12.8 oz (61.1 kg)   SpO2 99%   PF (!) 4 L/min   BMI 19.91 kg/m                                 ALLERGIES:  is allergic to bee venom.  Meds: Current Outpatient Medications  Medication Sig Dispense Refill  . albuterol (PROAIR HFA) 108 (90 Base) MCG/ACT inhaler Inhale 2 puffs into the lungs every 6 (six) hours as needed for wheezing.     Marland Kitchen ALPRAZolam (XANAX) 0.5 MG tablet Take 0.25-0.5 mg by mouth 3 (three) times daily as needed for anxiety.    .  Ascorbic Acid (VITAMIN C PO) Take 1 tablet by mouth daily.    Marland Kitchen aspirin EC 81 MG tablet Take 81 mg by mouth daily.     Marland Kitchen atorvastatin (LIPITOR) 20 MG tablet TAKE 1 TABLET(20 MG) BY MOUTH DAILY 90 tablet 0  . b complex vitamins capsule Take 1 capsule by mouth daily.    . bisoprolol (ZEBETA) 5 MG tablet TAKE 1/2 TABLET BY MOUTH EVERY DAY (Patient taking differently: Take 2.5 mg by mouth daily. ) 45 tablet 3  . Cholecalciferol (VITAMIN D3 PO) Take 1 capsule by mouth daily.    Marland Kitchen esomeprazole (NEXIUM) 40 MG capsule Take 40 mg by mouth daily.     Marland Kitchen ezetimibe (ZETIA) 10 MG tablet Take 1 tablet (10 mg total) by mouth daily. 90 tablet 1  . famotidine-calcium carbonate-magnesium hydroxide (PEPCID COMPLETE) 10-800-165 MG chewable tablet Chew 1 tablet by mouth daily as needed (acid reflux).    . Fluticasone-Umeclidin-Vilant (TRELEGY ELLIPTA) 100-62.5-25 MCG/INH AEPB INHALE 1 PUFF INTO THE LUNGS EVERY DAY (Patient taking differently: Inhale 1 puff into the lungs daily.) 60 each 0  . hydrocortisone cream 1 % Apply to affected area 2 times daily (Patient taking differently: Apply 1 application topically 2 (two) times daily  as needed for itching.) 15 g 0  . montelukast (SINGULAIR) 10 MG tablet One at bedtime every night (Patient taking differently: Take 10 mg by mouth at bedtime.) 30 tablet 11  . Multiple Vitamins-Minerals (MULTIVITAMIN WITH MINERALS) tablet Take 1 tablet by mouth daily.    . Multiple Vitamins-Minerals (ZINC PO) Take 1 tablet by mouth daily.    . nicotine (NICODERM CQ - DOSED IN MG/24 HOURS) 21 mg/24hr patch Place 21 mg onto the skin daily.    . nitroGLYCERIN (NITROSTAT) 0.4 MG SL tablet PLACE 1 TABLET UNDER THE TONGUE EVERY 5 MINUTES AS NEEDED. FOLLOW MD DIRECTIONS FOR WHEN TO CALL 911 (Patient taking differently: Place 0.4 mg under the tongue every 5 (five) minutes as needed for chest pain.) 25 tablet 3  . OXYGEN Inhale into the lungs as needed. 4 Liters supplemental o2 , as needed "when im up  moving around"    . oxymetazoline (AFRIN) 0.05 % nasal spray Place 1 spray into both nostrils 2 (two) times daily as needed for congestion.     . Tetrahydrozoline HCl (VISINE OP) Place 1 drop into both eyes daily as needed (irritation).     No current facility-administered medications for this encounter.    Physical Findings: The patient is in no acute distress. Patient is alert and oriented. He presents in a wheelchair and is breathing oxygen per nasal cannula. He is cachectic.  height is _0  (1.753 m) and weight is 134 lb 12.8 oz (61.1 kg). His temperature is 97.8 F (36.6 C). His blood pressure is 120/63 and his pulse is 66. His respiration is 24 (abnormal) and oxygen saturation is 99%. .      Lab Findings: Lab Results  Component Value Date   WBC 11.8 (H) 03/15/2020   HGB 12.6 (L) 03/15/2020   HCT 38.4 (L) 03/15/2020   MCV 102.4 (H) 03/15/2020   PLT 310 03/15/2020    Radiographic Findings: No results found.  Impression/Plan:   Today, I again talked to the patient and his daughter about the findings and work-up thus far.The biopsies by pulmonology have been helpful in guiding his care. We discussed the patient's diagnosis of non-small cell lung cancer and general treatment for this, highlighting the role of radiotherapy in the management. We discussed the available radiation techniques, and focused on the details of logistics and delivery.They are agreeable to proceeding with radiation therapy to the known sites of disease. I recommend a 6.5-week course of radiation therapy to the left lower lobe, left hilum, and involved mediastinum. We will monitor the rest of his lungs with serial imaging. Systemic therapy will not be given unless warranted based on further imaging  We discussed the risks, benefits, and side effects of radiotherapy. Side effects may include but not necessarily be limited to: Skin irritation, fatigue, pulmonary injury, cough, heart injury, spinal cord injury,  esophageal injury; no guarantees of treatment were given. A consent form was signed and placed in the patient's medical record. The patient was encouraged to ask questions that I answered to the best of my ability.   He will proceed with treatment planning today and start treatment in the near future.   On date of service, in total, I spent 20 minutes on this encounter. Patient was seen in person.  _____________________________________   Eppie Gibson, MD

## 2020-03-21 NOTE — Progress Notes (Signed)
[place] Work  Holiday representative met with patient in the patient and family support center to review resources.  CSW and patient completed application for the Lung Cancer Initiative gas program, and CSW enrolled patient in the [facility] program.  CSW also provided patient with information for the [employer].  CSW provided contact information and encouraged patient to call with needs or concerns.  Johnnye Lana, MSW, LCSW, OSW-C Clinical Social Worker [facility] [phone]

## 2020-03-21 NOTE — Addendum Note (Signed)
Encounter addended by: Eppie Gibson, MD on: 03/21/2020 6:03 PM  Actions taken: Pend clinical note

## 2020-03-22 ENCOUNTER — Other Ambulatory Visit: Payer: Self-pay

## 2020-03-22 ENCOUNTER — Other Ambulatory Visit: Payer: Self-pay | Admitting: Cardiology

## 2020-03-24 DIAGNOSIS — C3432 Malignant neoplasm of lower lobe, left bronchus or lung: Secondary | ICD-10-CM | POA: Diagnosis not present

## 2020-03-24 LAB — MAC SUSCEPTIBILITY BROTH
Ciprofloxacin: >8
Clarithromycin: 0.5
Moxifloxacin: 4
Rifampin: >4
Streptomycin: >32

## 2020-03-24 LAB — AFB ORGANISM ID BY DNA PROBE
M avium complex: POSITIVE — AB
M tuberculosis complex: NEGATIVE

## 2020-03-24 LAB — ACID FAST CULTURE WITH REFLEXED SENSITIVITIES (MYCOBACTERIA): Acid Fast Culture: POSITIVE — AB

## 2020-03-27 ENCOUNTER — Ambulatory Visit (INDEPENDENT_AMBULATORY_CARE_PROVIDER_SITE_OTHER): Payer: Medicare Other | Admitting: Infectious Diseases

## 2020-03-27 ENCOUNTER — Encounter: Payer: Self-pay | Admitting: Infectious Diseases

## 2020-03-27 ENCOUNTER — Other Ambulatory Visit: Payer: Self-pay

## 2020-03-27 VITALS — BP 107/70 | HR 97 | Temp 98.1°F

## 2020-03-27 DIAGNOSIS — B182 Chronic viral hepatitis C: Secondary | ICD-10-CM

## 2020-03-27 DIAGNOSIS — A310 Pulmonary mycobacterial infection: Secondary | ICD-10-CM | POA: Diagnosis not present

## 2020-03-27 LAB — GUARDANT 360

## 2020-03-27 NOTE — Progress Notes (Signed)
[name]                                                             [place], [place], [place], [address]                                                                  Phn. 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 Male with PMH of COPD, h/o CAD with MI s/p stent, Hepatitis C with cirrhosis s/p treatment, Lung cancer in recurrence, Liver ca  ( off treatment currently) and skin ca ( ablated per patient but has not followed up) who is referred for evaluation of MAC growing from one of his AFB sputum cultures. HIV is NR  I am not convinced that he needs treatment for Pulmonary MAC at this point. His respiratory symptoms and CT chest findings are more related to his lung cancer recurrence than solely due to MAC infection. Having said that, he still does not meet the microbiologic criteria for diagnosis of Pulmonary MAC infection ( 2 sputum cultures positive for MAC, one bronchial wash or lavage or . Transbronchial or other lung biopsy with mycobacterial histopathologic features).   Will give him 2 more sputum collection cups to collect early morning.sputum for AFB smear and culture. He has been instructed on how to collect it.  He will be starting Radiation for his lung ca from 12/14 I will follow him up in 2 months to assess need for treatment for Pulmonary MAC Follow up with Oncology and Radiation Oncology for management of his malignancy  All questions and concerns were discussed and addressed. Patient verbalized understanding of the plan. ____________________________________________________________________________________________________________________  HPI: Adam Bass 69 y.o. Male with PMH of COPD, h/o CAD with MI s/p stent, Lung ca, Liver ca and skin ca ( ablated per patient but has not followed up) who is referred for evaluation of MAC growing from one of his AFB sputum cultures.   On review of notes from Dr Burr Medico ( Oncologist):  His initial left lung cancer was found during COPD evaluation and diagnosed 03/2017. He was treated with 3 rounds of SBRT by Dr Isidore Moos. His lung lesions decreased but not completely gone. He was followed by Dr Delton Coombes since with scans but lost follow up. For evaluation of his Liver cancer he was found to have liver lesion and new left lung lesions. Bronchoscopy showed this to be lung cancer recurrence.    Patient says  he used to smoke a pack of cigarettes a day for 45 years and is cutting down on it, currently smoking 3 cigarettes a day. He is using a nicotine patch to help quit smoking. He says he used 4 L oxygen continuously during day and night. Denies any recent increment in his oxygen. Daughter says he has been on 4l oxygen since early 2020. He lives by himself in [place] and is able to do some household chores by himself. He has 2 adult daughters. Daughter was present in the visit today and she thinks his respiratory status has declined over the last 6 months like he gets SOB while walking upstairs in his apartment ( 2nd floor). Cough is occasional with whitish phlegm production. No recent worsening of cough/phlegm/chest pain. Denies any fever, chills and sweats. Denies any nausea/vomiting and diarrhea. Appetite is good but says he has lost 20 lbs in the last 3 years. No urinary symptoms/headache/weakness/numbness. He quit alcohol 6 months ago and drinks only socially. Denies any illicit drugs use.   He says he will be starting on radiation treatment for his lung ca from tomorrow.   In terms of his [facility], per Dr Ernestina Penna notes.  -[place] Dx in 2020. Treated by Dr Kathlene Cote by Liver ablation on 06/10/18  -MRI abdomen on 08/03/19  showed slow increase in known liver lesion. AFP has also increased lately. Dr Kathlene Cote feels if this is slow growing disease it is fine to observe for now while he proceeds with lung cancer treatment, which I agree.  Im regards to Hep C, he was diagnosed with cirrhosis secondary to hepatitis C in 2019 and he was also found to have 4 hepatic lesions 1 of which in the left lobe met criterion for [facility]. He finished therapy with Mavyret on 06/10/2018 followed by SVR.  Patient had ablation of left lobe [place] on 06/10/2018 by Dr. Kathlene Cote.   ROS: Constitutional: Negative for fever, chills, activity change, appetite change, fatigue; WEIGHT LOSS+  HENT: Negative for congestion, sore throat, rhinorrhea, sneezing, trouble swallowing and sinus pressure.  Eyes: Negative for photophobia and visual disturbance.  Respiratory: Negative for wheezing and stridor. COUGH+ AND SOB+ Cardiovascular: Negative for chest pain, palpitations and leg swelling.  Gastrointestinal: Negative for nausea, vomiting, abdominal pain, diarrhea, constipation, blood in stool, abdominal distention and anal bleeding.  Genitourinary: Negative for dysuria, hematuria, flank pain and difficulty urinating.  Musculoskeletal: Negative for myalgias, back pain, joint swelling, arthralgias and gait problem.  Skin: Negative for color change, pallor, rash and wound.  Neurological: Negative for dizziness, tremors, weakness and light-headedness.  Hematological: Negative for adenopathy. Does not bruise/bleed easily.  Psychiatric/Behavioral: Negative for behavioral problems, confusion, sleep disturbance, dysphoric mood, decreased concentration and agitation.   Past Medical History:  Diagnosis Date  . Anticoagulant long-term use    brillinta  . Bilateral lower extremity edema    feet, noticed since discharge from hospital admission 05-18-2018, "i was laid up in the bed for five days"   . CAD (coronary artery disease) cardiologist-- dr j. branch   hx STEMI  11-23-2017,  s/p  cardiac cath with Thrombectomy, PCI and DES x1 to midRCA, and other nonobstrucitve CAD involving pLAD, OM1, dRCA, posterior atrio, LVEF 45-50%,  LVEDP 62mHg  . COPD (chronic obstructive pulmonary disease) with emphysema ([facility]    pulmonology-- dr wert-- GOLD III mix (pt still smokes),  last exacerbation -due to the flu , [facility]  . DOE (dyspnea on exertion)   . Ectatic abdominal  aorta ([place])    MRI 11/ 2019  . Hepatitis C GI-- dr Laural Golden   dx 02-02-2018--- currently treated on antiviral medication  . Hepatocellular carcinoma ([place])   . History of radiation therapy 06/03/17- 06/09/17   Left Lung treated to 54 Gy with 3 fx of 18 Gy. SBRT  . History of skin cancer    04/ 2017 excision from face  . History of ST elevation myocardial infarction (STEMI) 11/23/2017   inferior wall-- s/p cardiac cath w/ thrombectomy, PCI, and DES  . HLD (hyperlipidemia)   . Hypertension   . Myocardial infarction ([place])   . Primary squamous cell carcinoma of left lung [facility]) oncologist-- dr Isidore Moos   dx 02/ 2018-- Stage IA2 Left lower lung,  s/p  Stereotactic radiation completed 06-09-2017  . Productive cough    per pt mostly clear  . Requires supplemental oxygen    06-09-2018 currently pt is using a portable O2 as needed and at night  . S/P drug eluting coronary stent placement 11/23/2017   DES x1 to midRCA  . Urinary incontinence    wear depends  . Wears dentures    fuller upper,  lower partial  . Wears glasses    Current Outpatient Medications on File Prior to Visit  Medication Sig Dispense Refill  . albuterol (PROAIR HFA) 108 (90 Base) MCG/ACT inhaler Inhale 2 puffs into the lungs every 6 (six) hours as needed for wheezing.     Marland Kitchen ALPRAZolam (XANAX) 0.5 MG tablet Take 0.25-0.5 mg by mouth 3 (three) times daily as needed for anxiety.    . Ascorbic Acid (VITAMIN C PO) Take 1 tablet by mouth daily.    Marland Kitchen aspirin EC 81 MG tablet Take 81 mg by mouth daily.     Marland Kitchen  atorvastatin (LIPITOR) 20 MG tablet TAKE 1 TABLET(20 MG) BY MOUTH DAILY 90 tablet 0  . b complex vitamins capsule Take 1 capsule by mouth daily.    . bisoprolol (ZEBETA) 5 MG tablet TAKE 1/2 TABLET BY MOUTH EVERY DAY (Patient taking differently: Take 2.5 mg by mouth daily. ) 45 tablet 3  . Cholecalciferol (VITAMIN D3 PO) Take 1 capsule by mouth daily.    Marland Kitchen esomeprazole (NEXIUM) 40 MG capsule Take 40 mg by mouth daily.     Marland Kitchen ezetimibe (ZETIA) 10 MG tablet Take 1 tablet (10 mg total) by mouth daily. 90 tablet 1  . famotidine-calcium carbonate-magnesium hydroxide (PEPCID COMPLETE) 10-800-165 MG chewable tablet Chew 1 tablet by mouth daily as needed (acid reflux).    . Fluticasone-Umeclidin-Vilant (TRELEGY ELLIPTA) 100-62.5-25 MCG/INH AEPB INHALE 1 PUFF INTO THE LUNGS EVERY DAY (Patient taking differently: Inhale 1 puff into the lungs daily. ) 60 each 0  . hydrocortisone cream 1 % Apply to affected area 2 times daily (Patient taking differently: Apply 1 application topically 2 (two) times daily as needed for itching. ) 15 g 0  . montelukast (SINGULAIR) 10 MG tablet One at bedtime every night (Patient taking differently: Take 10 mg by mouth at bedtime. ) 30 tablet 11  . Multiple Vitamins-Minerals (MULTIVITAMIN WITH MINERALS) tablet Take 1 tablet by mouth daily.    . Multiple Vitamins-Minerals (ZINC PO) Take 1 tablet by mouth daily.    . nicotine (NICODERM CQ - DOSED IN MG/24 HOURS) 21 mg/24hr patch Place 21 mg onto the skin daily.    . nitroGLYCERIN (NITROSTAT) 0.4 MG SL tablet PLACE 1 TABLET UNDER THE TONGUE EVERY 5 MINUTES AS NEEDED. FOLLOW MD DIRECTIONS FOR WHEN TO CALL 911 (  Patient taking differently: Place 0.4 mg under the tongue every 5 (five) minutes as needed for chest pain. ) 25 tablet 3  . OXYGEN Inhale into the lungs as needed. 4 Liters supplemental o2 , as needed "when im up moving around"    . oxymetazoline (AFRIN) 0.05 % nasal spray Place 1 spray into both nostrils 2 (two) times daily as  needed for congestion.     . Tetrahydrozoline HCl (VISINE OP) Place 1 drop into both eyes daily as needed (irritation).     No current facility-administered medications on file prior to visit.   Allergies  Allergen Reactions  . Bee Venom Hives   Social History   Socioeconomic History  . Marital status: Divorced    Spouse name: Not on file  . Number of children: 2  . Years of education: Not on file  . Highest education level: Not on file  Occupational History  . Not on file  Tobacco Use  . Smoking status: Current Every Day Smoker    Packs/day: 0.75    Years: 50.00    Pack years: 37.50    Types: Cigars  . Smokeless tobacco: Never Used  . Tobacco comment: . He is smoking about 5-10 cigarettes daily.   Vaping Use  . Vaping Use: Former  . Quit date: 10/07/2017  Substance and Sexual Activity  . Alcohol use: Yes    Alcohol/week: 8.0 standard drinks    Types: 8 Cans of beer per week    Comment: 8 beers weekly, give or take   . Drug use: No  . Sexual activity: Not on file  Other Topics Concern  . Not on file  Social History Narrative  . Not on file   Social Determinants of Health   Financial Resource Strain: Not on file  Food Insecurity: Not on file  Transportation Needs: Not on file  Physical Activity: Not on file  Stress: Not on file  Social Connections: Not on file  Intimate Partner Violence: Not on file     Vitals   Examination  General - not in acute distress, comfortably sitting in chair HEENT - PEERLA, no pallor and no icterus Chest - b/l clear air entry, no additional sounds CVS- Normal s1s2, RRR Abdomen - Soft, Non tender , non distended Ext- no pedal edema Neuro: grossly normal Back - WNL Psych : calm and cooperative   Recent labs CBC Latest Ref Rng & Units 03/15/2020 02/29/2020 01/04/2020  WBC 4.0 - 10.5 K/uL 11.8(H) 11.1(H) 10.9(H)  Hemoglobin 13.0 - 17.0 g/dL 12.6(L) 12.5(L) 14.1  Hematocrit 39.0 - 52.0 % 38.4(L) 38.7(L) 42.5  Platelets 150 -  400 K/uL 310 272 299   CMP Latest Ref Rng & Units 03/15/2020 02/29/2020 01/04/2020  Glucose 70 - 99 mg/dL 103(H) 92 115(H)  BUN 8 - 23 mg/dL _0 Creatinine 0.61 - 1.24 mg/dL 0.67 0.53(L) 0.58(L)  Sodium 135 - 145 mmol/L 140 139 138  Potassium 3.5 - 5.1 mmol/L 4.1 4.3 4.1  Chloride 98 - 111 mmol/L 98 97(L) 96(L)  CO2 22 - 32 mmol/L 34(H) 30 37(H)  Calcium 8.9 - 10.3 mg/dL 9.8 9.2 9.3  Total Protein 6.5 - 8.1 g/dL 7.5 6.9 6.9  Total Bilirubin 0.3 - 1.2 mg/dL 0.5 0.6 0.6  Alkaline Phos 38 - 126 U/L 130(H) 112 -  AST 15 - 41 U/L 36 39 34  ALT 0 - 44 U/L 37 47(H) 41     Pertinent Microbiology Results for orders placed or performed  during the hospital encounter of 02/29/20  SARS Coronavirus 2 by RT PCR (hospital order, performed in [facility] hospital lab) Nasopharyngeal Nasopharyngeal Swab     Status: None   Collection Time: 02/29/20 12:51 PM   Specimen: Nasopharyngeal Swab  Result Value Ref Range Status   SARS Coronavirus 2 NEGATIVE NEGATIVE Final    Comment: (NOTE) SARS-CoV-2 target nucleic acids are NOT DETECTED.  The SARS-CoV-2 RNA is generally detectable in upper and lower respiratory specimens during the acute phase of infection. The lowest concentration of SARS-CoV-2 viral copies this assay can detect is 250 copies / mL. A negative result does not preclude SARS-CoV-2 infection and should not be used as the sole basis for treatment or other patient management decisions.  A negative result may occur with improper specimen collection / handling, submission of specimen other than nasopharyngeal swab, presence of viral mutation(s) within the areas targeted by this assay, and inadequate number of viral copies (<250 copies / mL). A negative result must be combined with clinical observations, patient history, and epidemiological information.  Fact Sheet for Patients:   StrictlyIdeas.no  Fact Sheet for Healthcare  Providers: BankingDealers.co.za  This test is not yet approved or  cleared by the [place] FDA and has been authorized for detection and/or diagnosis of SARS-CoV-2 by FDA under an Emergency Use Authorization (EUA).  This EUA will remain in effect (meaning this test can be used) for the duration of the COVID-19 declaration under Section 564(b)(1) of the Act, 21 U.S.C. section 360bbb-3(b)(1), unless the authorization is terminated or revoked sooner.  Performed at [facility], [place] [address], [place], [place] [address]   Anaerobic culture     Status: None   Collection Time: 02/29/20  3:58 PM   Specimen: PATH Cytology Bronchial lavage; Body Fluid  Result Value Ref Range Status   Specimen Description BRONCHIAL ALVEOLAR LAVAGE RIGHT UPPER LOBE LUNG  Final   Special Requests NONE  Final   Culture   Final    NO ANAEROBES ISOLATED Performed at [facility], [address], [place], [place] [address]    Report Status 03/05/2020 FINAL  Final  Fungus Culture With Stain     Status: None (Preliminary result)   Collection Time: 02/29/20  3:58 PM   Specimen: PATH Cytology Bronchial lavage; Body Fluid  Result Value Ref Range Status   Fungus Stain Final report  Final    Comment: (NOTE) Performed At: [facility] [place], [place] [id number] Rush Farmer MD [id number]    Fungus (Mycology) Culture PENDING  Incomplete   Fungal Source BRONCHIAL ALVEOLAR LAVAGE  Final    Comment: LLL Performed at [facility], [place] [address] [place], [place] [address]   Acid Fast Culture with reflexed sensitivities     Status: Abnormal   Collection Time: 02/29/20  3:58 PM   Specimen: PATH Cytology Bronchial lavage; Body Fluid  Result Value Ref Range Status   Acid Fast Culture Positive (A)  Final    Comment: (NOTE) Acid-fast bacilli have been detected in culture at 2 weeks; see AFB Organism ID by DNA probe Reported positive AFB Culture  and ID and faxed to [phone] to [facility]. at account in Micro lab on 03/24/20 at 2:04 PM. T.Harris Performed At: [facility] [place], [place] [id number] Rush Farmer MD [id number]    Source of Sample BRONCHIAL ALVEOLAR LAVAGE  Final    Comment: LLL Performed at [facility], [address] [place], [place] [address]   Acid Fast Smear (AFB)     Status: None   Collection Time: 02/29/20  3:58 PM   Specimen: PATH Cytology Bronchial lavage; Body Fluid  Result Value Ref Range Status   AFB Specimen Processing Concentration  Final   Acid Fast Smear Negative  Final    Comment: (NOTE) Performed At: [facility] [place], [place] [id number] Rush Farmer MD [id number]    Source (AFB) BRONCHIAL ALVEOLAR LAVAGE  Final    Comment: LLL Performed at [facility], [place] [address], [place], [place] [address]   Culture, BAL-quantitative     Status: Abnormal   Collection Time: 02/29/20  3:58 PM   Specimen: Bronchoalveolar Lavage  Result Value Ref Range Status   Specimen Description BRONCHIAL ALVEOLAR LAVAGE LEFT LOWER LUNG  Final   Special Requests NONE  Final   Gram Stain   Final    ABUNDANT WBC PRESENT, PREDOMINANTLY PMN NO ORGANISMS SEEN    Culture (A)  Final    >=100,000 COLONIES/mL HAEMOPHILUS INFLUENZAE BETA LACTAMASE NEGATIVE Performed at [facility], [place] [address], [place], [place] [address]    Report Status 03/01/2020 FINAL  Final  Fungus Culture Result     Status: None   Collection Time: 02/29/20  3:58 PM  Result Value Ref Range Status   Result 1 Comment  Final    Comment: (NOTE) KOH/Calcofluor preparation:  no fungus observed. Performed At: [facility] [place], [place] [id number] Rush Farmer MD [id number]   AFB Organism ID By DNA Probe     Status: Abnormal   Collection Time: 02/29/20  3:58 PM  Result Value Ref Range Status   M tuberculosis complex Negative  Final    M avium complex Positive (A)  Final   M kansasii Not Indicated  Final   M gordonae Not Indicated  Final   Other: NAIDN  Final    Comment: No additional identification testing is necessary.   Susceptibility Testing See reflex.  Final    Comment: (NOTE) Performed At: [facility] [place], [place] [id number] Rush Farmer MD [id number]   MAC Susceptibility Broth     Status: Abnormal   Collection Time: 02/29/20  3:58 PM  Result Value Ref Range Status   Organism ID Comment (A)  Final    Comment: Mycobacterium avium complex   Amikacin Comment  Final    Comment: (NOTE) 32.0 ug/mL Intermediate Breakpoints have been established for only some organism-drug combinations as indicated. Results of this test are labeled for research purposes only by the assay's manufacturer. The performance characteristics of this assay have not been established by the manufacturer. The result should not be used for treatment or for diagnostic purposes without confirmation of the diagnosis by another medically established diagnostic product or procedure. The performance characteristics were determined by [facility].    Ciprofloxacin >8.0 ug/mL  Final    Comment: (NOTE) Breakpoints have been established for only some organism-drug combinations as indicated. Results of this test are labeled for research purposes only by the assay's manufacturer. The performance characteristics of this assay have not been established by the manufacturer. The result should not be used for treatment or for diagnostic purposes without confirmation of the diagnosis by another medically established diagnostic product or procedure. The performance characteristics were determined by [facility].    Clarithromycin 0.5 ug/mL Susceptible  Final    Comment: (NOTE) Breakpoints have been established for only  some organism-drug combinations as indicated. Results of this test are labeled for research purposes only  by the assay's manufacturer. The performance characteristics of this assay have not been established by the manufacturer. The result should not be used for treatment or for diagnostic purposes without confirmation of the diagnosis by another medically established diagnostic product or procedure. The performance characteristics were determined by [facility].    Linezolid Comment  Final    Comment: (NOTE) 16.0 ug/mL Intermediate Breakpoints have been established for only some organism-drug combinations as indicated. Results of this test are labeled for research purposes only by the assay's manufacturer. The performance characteristics of this assay have not been established by the manufacturer. The result should not be used for treatment or for diagnostic purposes without confirmation of the diagnosis by another medically established diagnostic product or procedure. The performance characteristics were determined by [facility].    Moxifloxacin 4.0 ug/mL Resistant  Final    Comment: (NOTE) Breakpoints have been established for only some organism-drug combinations as indicated. Results of this test are labeled for research purposes only by the assay's manufacturer. The performance characteristics of this assay have not been established by the manufacturer. The result should not be used for treatment or for diagnostic purposes without confirmation of the diagnosis by another medically established diagnostic product or procedure. The performance characteristics were determined by [facility].    Rifampin >4.0 ug/mL  Final    Comment: (NOTE) Breakpoints have been established for only some organism-drug combinations as indicated. Results of this test are labeled for research purposes only by the assay's manufacturer. The performance characteristics of this assay have not been established by the manufacturer. The result should not be used for treatment or for diagnostic purposes  without confirmation of the diagnosis by another medically established diagnostic product or procedure. The performance characteristics were determined by [facility].    Streptomycin >32.0 ug/mL  Final    Comment: (NOTE) Breakpoints have been established for only some organism-drug combinations as indicated. Results of this test are labeled for research purposes only by the assay's manufacturer. The performance characteristics of this assay have not been established by the manufacturer. The result should not be used for treatment or for diagnostic purposes without confirmation of the diagnosis by another medically established diagnostic product or procedure. The performance characteristics were determined by [facility].       Pertinent Imaging  CT Chest 01/28/2020 FINDINGS: Cardiovascular: Coronary artery calcification and aortic atherosclerotic calcification.  Mediastinum/Nodes: New LEFT hilar lymph node measures 1.8 cm. Adjacent peribronchial node measuring 1.8 cm.  Lungs/Pleura: Two adjacent rounded lesions in the RIGHT lower lobe at site of small nodule on comparison CT. Larger mass lesions measures 3.2 cm (image 129/4). Adjacent nodular lesion measures 1.9 cm.  Stable small LEFT upper lobe nodule measuring 6 mm (image 95/4).  Within the RIGHT upper lobe thick-walled cavitary nodule measuring 2.1 cm (image 37/4) is new from prior.  Peripheral nodular densities in the RIGHT lower lobe are new. For example 10 mm nodule on image 164/4.  Extensive centrilobular emphysema the upper lobes.  Upper Abdomen: Hepatic lesion. See comparison MRI for suspicion of hepatocellular carcinoma.  Musculoskeletal: No aggressive osseous lesion.  IMPRESSION: 1. New mass lesion and adjacent nodule in the LEFT lower lobe consistent with lung cancer recurrence. 2. New LEFT hilar lymph nodes consistent with nodal metastasis. 3. New thick-walled cavitary lesion in the RIGHT upper  lobe is concerning for metachronous bronchogenic carcinoma versus metastasis. 4. Additional nodules in the periphery  of the RIGHT lower lobe are concerning. 5. Consider FDG PET scan for further evaluation. 6. Hepatocellular carcinoma lesions in the liver described on comparison MRI   All pertinent labs/Imagings/notes reviewed. All pertinent plain films and CT images have been personally visualized and interpreted; radiology reports have been reviewed. Decision making incorporated into the Impression / Recommendations.  I spent greater than 25 minutes with the patient including  review of prior medical records with greater than 50% of time in face to face counsel of the patient.    Electronically signed by:  Rosiland Oz, MD Infectious Disease Physician [facility] [address] [place], [place] [id number] Phone: [phone]  Fax: [phone]

## 2020-03-28 ENCOUNTER — Ambulatory Visit
Admission: RE | Admit: 2020-03-28 | Discharge: 2020-03-28 | Disposition: A | Discharge status: Home / Self Care | Payer: Medicare Other | Source: Ambulatory Visit | Attending: Radiation Oncology | Admitting: Radiation Oncology

## 2020-03-28 ENCOUNTER — Other Ambulatory Visit: Payer: Self-pay

## 2020-03-28 DIAGNOSIS — C3432 Malignant neoplasm of lower lobe, left bronchus or lung: Secondary | ICD-10-CM | POA: Diagnosis not present

## 2020-03-29 ENCOUNTER — Ambulatory Visit
Admission: RE | Admit: 2020-03-29 | Discharge: 2020-03-29 | Disposition: A | Discharge status: Home / Self Care | Payer: Medicare Other | Source: Ambulatory Visit | Attending: Radiation Oncology | Admitting: Radiation Oncology

## 2020-03-29 DIAGNOSIS — C3432 Malignant neoplasm of lower lobe, left bronchus or lung: Secondary | ICD-10-CM | POA: Diagnosis not present

## 2020-03-29 DIAGNOSIS — A310 Pulmonary mycobacterial infection: Secondary | ICD-10-CM | POA: Insufficient documentation

## 2020-03-29 LAB — FUNGUS CULTURE WITH STAIN

## 2020-03-29 LAB — FUNGAL ORGANISM REFLEX

## 2020-03-29 LAB — FUNGUS CULTURE RESULT

## 2020-03-29 NOTE — Assessment & Plan Note (Signed)
Treated in the past with SVR

## 2020-03-29 NOTE — Assessment & Plan Note (Signed)
Only one sputum AFB cx positive for MAC Will have him collect 2 more sputum samples for repeat AFB smear and cx Fu in 2 months

## 2020-03-30 ENCOUNTER — Ambulatory Visit
Admission: RE | Admit: 2020-03-30 | Discharge: 2020-03-30 | Disposition: A | Discharge status: Home / Self Care | Payer: Medicare Other | Source: Ambulatory Visit | Attending: Radiation Oncology | Admitting: Radiation Oncology

## 2020-03-30 ENCOUNTER — Other Ambulatory Visit: Payer: Self-pay

## 2020-03-30 DIAGNOSIS — C3432 Malignant neoplasm of lower lobe, left bronchus or lung: Secondary | ICD-10-CM | POA: Diagnosis not present

## 2020-03-31 ENCOUNTER — Ambulatory Visit
Admission: RE | Admit: 2020-03-31 | Discharge: 2020-03-31 | Disposition: A | Discharge status: Home / Self Care | Payer: Medicare Other | Source: Ambulatory Visit | Attending: Radiation Oncology | Admitting: Radiation Oncology

## 2020-03-31 ENCOUNTER — Other Ambulatory Visit: Payer: Self-pay

## 2020-03-31 DIAGNOSIS — C3432 Malignant neoplasm of lower lobe, left bronchus or lung: Secondary | ICD-10-CM | POA: Diagnosis not present

## 2020-03-31 NOTE — Addendum Note (Signed)
Encounter addended by: Eppie Gibson, MD on: 03/31/2020 6:17 PM  Actions taken: Level of Service modified, Visit diagnoses modified, Clinical Note Signed

## 2020-04-03 ENCOUNTER — Ambulatory Visit
Admission: RE | Admit: 2020-04-03 | Discharge: 2020-04-03 | Disposition: A | Discharge status: Home / Self Care | Payer: Medicare Other | Source: Ambulatory Visit | Attending: Radiation Oncology | Admitting: Radiation Oncology

## 2020-04-03 ENCOUNTER — Other Ambulatory Visit: Payer: Self-pay

## 2020-04-03 ENCOUNTER — Other Ambulatory Visit: Payer: Medicare Other

## 2020-04-03 ENCOUNTER — Telehealth: Payer: Self-pay | Admitting: Hematology

## 2020-04-03 DIAGNOSIS — C3432 Malignant neoplasm of lower lobe, left bronchus or lung: Secondary | ICD-10-CM | POA: Diagnosis not present

## 2020-04-03 DIAGNOSIS — A310 Pulmonary mycobacterial infection: Secondary | ICD-10-CM

## 2020-04-03 MED ORDER — SONAFINE EX EMUL
1.0000 "application " | Freq: Two times a day (BID) | CUTANEOUS | Status: DC
  Administered 2020-04-03: 1 via TOPICAL

## 2020-04-03 NOTE — Progress Notes (Signed)
Pt here for patient teaching.  Pt given Radiation and You booklet, skin care instructions and Sonafine.  Reviewed areas of pertinence such as fatigue, hair loss, skin changes, throat changes, cough and shortness of breath . Pt able to give teach back of to pat skin and use unscented/gentle soap,apply Sonafine bid and avoid applying anything to skin within 4 hours of treatment. Pt demonstrated understanding of information given and will contact nursing with any questions or concerns.     [email or web address]

## 2020-04-03 NOTE — Telephone Encounter (Signed)
Scheduled appt per 12/18 sch msg - pt is aware of appt added. Will call back if appt doesn't work

## 2020-04-04 ENCOUNTER — Ambulatory Visit
Admission: RE | Admit: 2020-04-04 | Discharge: 2020-04-04 | Disposition: A | Discharge status: Home / Self Care | Payer: Medicare Other | Source: Ambulatory Visit | Attending: Radiation Oncology | Admitting: Radiation Oncology

## 2020-04-04 DIAGNOSIS — C3432 Malignant neoplasm of lower lobe, left bronchus or lung: Secondary | ICD-10-CM | POA: Diagnosis not present

## 2020-04-05 ENCOUNTER — Ambulatory Visit
Admission: RE | Admit: 2020-04-05 | Discharge: 2020-04-05 | Disposition: A | Discharge status: Home / Self Care | Payer: Medicare Other | Source: Ambulatory Visit | Attending: Radiation Oncology | Admitting: Radiation Oncology

## 2020-04-05 ENCOUNTER — Other Ambulatory Visit: Payer: Self-pay

## 2020-04-05 DIAGNOSIS — C3432 Malignant neoplasm of lower lobe, left bronchus or lung: Secondary | ICD-10-CM | POA: Diagnosis not present

## 2020-04-06 ENCOUNTER — Ambulatory Visit
Admission: RE | Admit: 2020-04-06 | Discharge: 2020-04-06 | Disposition: A | Discharge status: Home / Self Care | Payer: Medicare Other | Source: Ambulatory Visit | Attending: Radiation Oncology | Admitting: Radiation Oncology

## 2020-04-06 DIAGNOSIS — C3432 Malignant neoplasm of lower lobe, left bronchus or lung: Secondary | ICD-10-CM | POA: Diagnosis not present

## 2020-04-10 ENCOUNTER — Ambulatory Visit
Admission: RE | Admit: 2020-04-10 | Discharge: 2020-04-10 | Disposition: A | Discharge status: Home / Self Care | Payer: Medicare Other | Source: Ambulatory Visit | Attending: Radiation Oncology | Admitting: Radiation Oncology

## 2020-04-10 DIAGNOSIS — C3432 Malignant neoplasm of lower lobe, left bronchus or lung: Secondary | ICD-10-CM | POA: Diagnosis not present

## 2020-04-11 ENCOUNTER — Ambulatory Visit
Admission: RE | Admit: 2020-04-11 | Discharge: 2020-04-11 | Disposition: A | Discharge status: Home / Self Care | Payer: Medicare Other | Source: Ambulatory Visit | Attending: Radiation Oncology | Admitting: Radiation Oncology

## 2020-04-11 ENCOUNTER — Other Ambulatory Visit: Payer: Self-pay

## 2020-04-11 DIAGNOSIS — C3432 Malignant neoplasm of lower lobe, left bronchus or lung: Secondary | ICD-10-CM | POA: Diagnosis not present

## 2020-04-12 ENCOUNTER — Ambulatory Visit
Admission: RE | Admit: 2020-04-12 | Discharge: 2020-04-12 | Disposition: A | Discharge status: Home / Self Care | Payer: Medicare Other | Source: Ambulatory Visit | Attending: Radiation Oncology | Admitting: Radiation Oncology

## 2020-04-12 DIAGNOSIS — C3432 Malignant neoplasm of lower lobe, left bronchus or lung: Secondary | ICD-10-CM | POA: Diagnosis not present

## 2020-04-13 ENCOUNTER — Ambulatory Visit
Admission: RE | Admit: 2020-04-13 | Discharge: 2020-04-13 | Disposition: A | Discharge status: Home / Self Care | Payer: Medicare Other | Source: Ambulatory Visit | Attending: Radiation Oncology | Admitting: Radiation Oncology

## 2020-04-13 DIAGNOSIS — C3432 Malignant neoplasm of lower lobe, left bronchus or lung: Secondary | ICD-10-CM | POA: Diagnosis not present

## 2020-04-17 ENCOUNTER — Ambulatory Visit
Admission: RE | Admit: 2020-04-17 | Discharge: 2020-04-17 | Disposition: A | Discharge status: Home / Self Care | Payer: Medicare Other | Source: Ambulatory Visit | Attending: Radiation Oncology | Admitting: Radiation Oncology

## 2020-04-17 DIAGNOSIS — C3432 Malignant neoplasm of lower lobe, left bronchus or lung: Secondary | ICD-10-CM | POA: Insufficient documentation

## 2020-04-17 DIAGNOSIS — Z510 Encounter for antineoplastic radiation therapy: Secondary | ICD-10-CM | POA: Insufficient documentation

## 2020-04-18 ENCOUNTER — Ambulatory Visit
Admission: RE | Admit: 2020-04-18 | Discharge: 2020-04-18 | Disposition: A | Discharge status: Home / Self Care | Payer: Medicare Other | Source: Ambulatory Visit | Attending: Radiation Oncology | Admitting: Radiation Oncology

## 2020-04-18 ENCOUNTER — Other Ambulatory Visit: Payer: Self-pay

## 2020-04-18 DIAGNOSIS — C3432 Malignant neoplasm of lower lobe, left bronchus or lung: Secondary | ICD-10-CM | POA: Diagnosis not present

## 2020-04-19 ENCOUNTER — Ambulatory Visit
Admission: RE | Admit: 2020-04-19 | Discharge: 2020-04-19 | Disposition: A | Discharge status: Home / Self Care | Payer: Medicare Other | Source: Ambulatory Visit | Attending: Radiation Oncology | Admitting: Radiation Oncology

## 2020-04-19 ENCOUNTER — Other Ambulatory Visit: Payer: Self-pay

## 2020-04-19 DIAGNOSIS — C3432 Malignant neoplasm of lower lobe, left bronchus or lung: Secondary | ICD-10-CM | POA: Diagnosis not present

## 2020-04-20 ENCOUNTER — Ambulatory Visit
Admission: RE | Admit: 2020-04-20 | Discharge: 2020-04-20 | Disposition: A | Discharge status: Home / Self Care | Payer: Medicare Other | Source: Ambulatory Visit | Attending: Radiation Oncology | Admitting: Radiation Oncology

## 2020-04-20 ENCOUNTER — Other Ambulatory Visit: Payer: Self-pay

## 2020-04-20 DIAGNOSIS — C3432 Malignant neoplasm of lower lobe, left bronchus or lung: Secondary | ICD-10-CM | POA: Diagnosis not present

## 2020-04-21 ENCOUNTER — Ambulatory Visit
Admission: RE | Admit: 2020-04-21 | Discharge: 2020-04-21 | Disposition: A | Discharge status: Home / Self Care | Payer: Medicare Other | Source: Ambulatory Visit | Attending: Radiation Oncology | Admitting: Radiation Oncology

## 2020-04-21 ENCOUNTER — Other Ambulatory Visit: Payer: Self-pay

## 2020-04-21 DIAGNOSIS — C3432 Malignant neoplasm of lower lobe, left bronchus or lung: Secondary | ICD-10-CM | POA: Diagnosis not present

## 2020-04-24 ENCOUNTER — Ambulatory Visit
Admission: RE | Admit: 2020-04-24 | Discharge: 2020-04-24 | Disposition: A | Discharge status: Home / Self Care | Payer: Medicare Other | Source: Ambulatory Visit | Attending: Radiation Oncology | Admitting: Radiation Oncology

## 2020-04-24 ENCOUNTER — Other Ambulatory Visit: Payer: Self-pay

## 2020-04-24 DIAGNOSIS — C3432 Malignant neoplasm of lower lobe, left bronchus or lung: Secondary | ICD-10-CM | POA: Diagnosis not present

## 2020-04-24 DIAGNOSIS — C3492 Malignant neoplasm of unspecified part of left bronchus or lung: Secondary | ICD-10-CM

## 2020-04-25 ENCOUNTER — Other Ambulatory Visit: Payer: Self-pay

## 2020-04-25 ENCOUNTER — Ambulatory Visit
Admission: RE | Admit: 2020-04-25 | Discharge: 2020-04-25 | Disposition: A | Discharge status: Home / Self Care | Payer: Medicare Other | Source: Ambulatory Visit | Attending: Radiation Oncology | Admitting: Radiation Oncology

## 2020-04-25 DIAGNOSIS — C3432 Malignant neoplasm of lower lobe, left bronchus or lung: Secondary | ICD-10-CM | POA: Diagnosis not present

## 2020-04-26 ENCOUNTER — Other Ambulatory Visit: Payer: Self-pay

## 2020-04-26 ENCOUNTER — Ambulatory Visit
Admission: RE | Admit: 2020-04-26 | Discharge: 2020-04-26 | Disposition: A | Discharge status: Home / Self Care | Payer: Medicare Other | Source: Ambulatory Visit | Attending: Radiation Oncology | Admitting: Radiation Oncology

## 2020-04-26 ENCOUNTER — Telehealth: Payer: Self-pay

## 2020-04-26 DIAGNOSIS — R059 Cough, unspecified: Secondary | ICD-10-CM

## 2020-04-26 DIAGNOSIS — C3492 Malignant neoplasm of unspecified part of left bronchus or lung: Secondary | ICD-10-CM

## 2020-04-26 DIAGNOSIS — C3432 Malignant neoplasm of lower lobe, left bronchus or lung: Secondary | ICD-10-CM | POA: Diagnosis not present

## 2020-04-26 MED ORDER — BENZONATATE 200 MG PO CAPS: 200.0000 mg | ORAL_CAPSULE | Freq: Three times a day (TID) | ORAL | 0 refills | Status: DC | PRN

## 2020-04-26 MED ORDER — LIDOCAINE VISCOUS HCL 2 % MT SOLN: 10.0000 mL | OROMUCOSAL | 2 refills | Status: AC | PRN

## 2020-04-26 NOTE — Telephone Encounter (Signed)
Received call from therapists on L2 that patient was having coughing fits and wanted to know if there was something he could take OTC or a prescription to help with symptoms. Updated Dr. Isidore Moos who gave verbal orders for tessalon pearls, viscous lidocaine, and OTC Robitussin; and to alternate between the 3 to hopefully give him maximum symptom control.   Called patient and relayed Dr. Pearlie Oyster recommendations. He verbalized understanding and asked that I call pharmacy to make sure they received prescriptions, and would have bottle of Robitussin when his prescriptions are ready for pick-up. Called pharmacy and spoke with pharmacy staff member who confirmed they received both viscous lidocaine and tessalon prescriptions, and that they would put a bottle of Robitussin to the side for when patient picks up his prescriptions.   Patient knows to update staff if he doesn't feel an improvement in symptoms by Friday

## 2020-04-27 ENCOUNTER — Ambulatory Visit
Admission: RE | Admit: 2020-04-27 | Discharge: 2020-04-27 | Disposition: A | Discharge status: Home / Self Care | Payer: Medicare Other | Source: Ambulatory Visit | Attending: Radiation Oncology | Admitting: Radiation Oncology

## 2020-04-27 DIAGNOSIS — C3432 Malignant neoplasm of lower lobe, left bronchus or lung: Secondary | ICD-10-CM | POA: Diagnosis not present

## 2020-04-28 ENCOUNTER — Ambulatory Visit
Admission: RE | Admit: 2020-04-28 | Discharge: 2020-04-28 | Disposition: A | Discharge status: Home / Self Care | Payer: Medicare Other | Source: Ambulatory Visit | Attending: Radiation Oncology | Admitting: Radiation Oncology

## 2020-04-28 DIAGNOSIS — C3432 Malignant neoplasm of lower lobe, left bronchus or lung: Secondary | ICD-10-CM | POA: Diagnosis not present

## 2020-05-01 ENCOUNTER — Ambulatory Visit: Payer: Medicare Other

## 2020-05-02 ENCOUNTER — Other Ambulatory Visit: Payer: Self-pay

## 2020-05-02 ENCOUNTER — Ambulatory Visit
Admission: RE | Admit: 2020-05-02 | Discharge: 2020-05-02 | Disposition: A | Discharge status: Home / Self Care | Payer: Medicare Other | Source: Ambulatory Visit | Attending: Radiation Oncology | Admitting: Radiation Oncology

## 2020-05-02 DIAGNOSIS — C3432 Malignant neoplasm of lower lobe, left bronchus or lung: Secondary | ICD-10-CM | POA: Diagnosis not present

## 2020-05-03 ENCOUNTER — Ambulatory Visit
Admission: RE | Admit: 2020-05-03 | Discharge: 2020-05-03 | Disposition: A | Discharge status: Home / Self Care | Payer: Medicare Other | Source: Ambulatory Visit | Attending: Radiation Oncology | Admitting: Radiation Oncology

## 2020-05-03 ENCOUNTER — Other Ambulatory Visit: Payer: Self-pay

## 2020-05-03 DIAGNOSIS — C3432 Malignant neoplasm of lower lobe, left bronchus or lung: Secondary | ICD-10-CM | POA: Diagnosis not present

## 2020-05-04 ENCOUNTER — Other Ambulatory Visit: Payer: Self-pay

## 2020-05-04 ENCOUNTER — Ambulatory Visit
Admission: RE | Admit: 2020-05-04 | Discharge: 2020-05-04 | Disposition: A | Discharge status: Home / Self Care | Payer: Medicare Other | Source: Ambulatory Visit | Attending: Radiation Oncology | Admitting: Radiation Oncology

## 2020-05-04 DIAGNOSIS — C3432 Malignant neoplasm of lower lobe, left bronchus or lung: Secondary | ICD-10-CM | POA: Diagnosis not present

## 2020-05-05 ENCOUNTER — Ambulatory Visit
Admission: RE | Admit: 2020-05-05 | Discharge: 2020-05-05 | Disposition: A | Discharge status: Home / Self Care | Payer: Medicare Other | Source: Ambulatory Visit | Attending: Radiation Oncology | Admitting: Radiation Oncology

## 2020-05-05 ENCOUNTER — Other Ambulatory Visit: Payer: Self-pay

## 2020-05-05 DIAGNOSIS — C3432 Malignant neoplasm of lower lobe, left bronchus or lung: Secondary | ICD-10-CM | POA: Diagnosis not present

## 2020-05-08 ENCOUNTER — Ambulatory Visit
Admission: RE | Admit: 2020-05-08 | Discharge: 2020-05-08 | Disposition: A | Discharge status: Home / Self Care | Payer: Medicare Other | Source: Ambulatory Visit | Attending: Radiation Oncology | Admitting: Radiation Oncology

## 2020-05-08 DIAGNOSIS — C3432 Malignant neoplasm of lower lobe, left bronchus or lung: Secondary | ICD-10-CM | POA: Diagnosis not present

## 2020-05-08 NOTE — Progress Notes (Signed)
[place]   [phone] [phone]   Clinic Follow up Note   Patient Care Team: [place], [place] as PCP - General (Family Medicine) Harl Bowie, Alphonse Guild, MD as PCP - Cardiology (Cardiology) Alba Destine, MD as Rounding Team (Internal Medicine) Truitt Merle, MD as Consulting Physician (Oncology)  Date of Service:  05/11/2020  CHIEF COMPLAINT: f/u of Left Lung cancer Recurrence   SUMMARY OF ONCOLOGIC HISTORY: Oncology History Overview Note  Cancer Staging Primary cancer of left lower lobe of lung [facility]) Staging form: Lung, AJCC 8th Edition - Clinical stage from 04/30/2017: Stage IA2 (cT1b, cN0, cM0) - Signed by Eppie Gibson, MD on 04/30/2017    Primary cancer of left lower lobe of lung ([place])  09/05/2016 Imaging   CT Chest: Mild apical scarring noted in the right lung.  Diffuse emphysematous changes bilaterally.  Irregular spiculated nodule in the left upper lobe measuring 2.1 cm.   10/02/2016 PET scan   No hypermetabolic lymphadenopathy in the neck.  No hypermetabolic mediastinal, hilar, or axillary lymph nodes.  Spiculated nodule in the left upper lobe measuring 1.9 cm, SUV max 4.5.  0.8 cm nodule in the left lower lobe with SUV max of 1.7.   no evidence of hypermetabolic lesions in the liver, adrenal glands, spleen, or pancreas.  No abdominal or pelvic lymphadenopathy.  Liver margin appears somewhat irregular with possible cirrhosis.   10/22/2016 Pathology Results   LtUL Nodule Bx: Lung with necrosis, inflammation, and focal calcifications.  No evidence of malignancy.   04/02/2017 PET scan   Left lower lobe lesion measuring 1.3 cm up from 0.8 cm previously, SUV max of 2.9 up from 1.7 in the past.  Left upper lobe nodule SUV max of 3.6 down from 4.5 previously, measuring 2.2 cm, 2.1 cm previously.  Bilateral hypermetabolic carotid lesions noted.   04/10/2017 Initial Diagnosis   Primary cancer of left lower lobe of lung ([place])    04/10/2017 Pathology Results   LtLL Lung Bx: Positive for presence of malignant cells, consistent with squamous cell carcinoma. IHC -- positive for CK5/6, p63 & negative for TTF-1 & Napsin-A   05/2017 - 06/09/2017 Radiation Therapy   3 doses of SBRT to Lung by Dr Isidore Moos   08/03/2019 Imaging   MRI Abdomen 08/03/19 IMPRESSION: Limited evaluation due to severe motion degradation.   Cirrhosis.  Stable ablation zone in segment 4A.   11 mm enhancing lesion in segment 8 (LI-RADS 3). Attention on follow-up is suggested. Given degree of motion degradation on the current study, consider liver protocol CT (with/without contrast) in 6 months.   Additional stable ancillary findings as above.IMPRESSION: Limited evaluation due to severe motion degradation.   Cirrhosis.  Stable ablation zone in segment 4A.   11 mm enhancing lesion in segment 8 (LI-RADS 3). Attention on follow-up is suggested. Given degree of motion degradation on the current study, consider liver protocol CT (with/without contrast) in 6 months.   Additional stable ancillary findings as above.   Hepatocellular carcinoma ([place])  06/10/2018 Initial Diagnosis   Hepatocellular carcinoma ([place])   06/10/2018 Procedure   Liver Ablation by Dr Kathlene Cote    10/02/2018 Imaging   MRI Abdomen  IMPRESSION: 1. No evidence hepatocellular carcinoma recurrence in the LEFT hepatic lobe at thermal ablation site. 2. Marginal enhancing lesion in the superior RIGHT hepatic lobe (segment 8) without washout is slightly enlarged from comparison exam. LiRAD- 3. Recommend close attention on follow- 3. Additional smaller enhancing lesions LEFT and RIGHT hepatic show  arterial enhancement but no delayed visibility. Potential dysplastic nodules.     08/03/2019 Imaging   MRI Abdomen  IMPRESSION: Limited evaluation due to severe motion degradation.   Cirrhosis.  Stable ablation zone in segment 4A.   11 mm enhancing lesion in segment 8 (LI-RADS 3).  Attention on follow-up is suggested. Given degree of motion degradation on the current study, consider liver protocol CT (with/without contrast) in 6 months.   Additional stable ancillary findings as above.     01/19/2020 Imaging   MRI Liver 01/19/20 IMPRESSION: 1. New or progressive 3.0 by 2.1 cm left lower lobe lung mass with adjacent 2.3 by 1.3 cm left lower lobe nodule. Appearance is highly concerning for malignancy and chest CT with contrast is recommended for better characterization. 2. LI-RADS category LR-5 (definite hepatocellular carcinoma) central in the right hepatic lobe. 3. Several LI-RADS category LR 3 lesions are present scattered in the liver. 4. Prior ablation site in segment 4 adjacent to the falciform ligament. 5. Descending colon diverticulosis. 6. Dextroconvex lumbar scoliosis with lumbar spondylosis and degenerative disc disease. 7.  Aortic Atherosclerosis (ICD10-I70.0).   NSCLC of left lung ([place])  01/19/2020 Imaging   MRI Liver 01/19/20 IMPRESSION: 1. New or progressive 3.0 by 2.1 cm left lower lobe lung mass with adjacent 2.3 by 1.3 cm left lower lobe nodule. Appearance is highly concerning for malignancy and chest CT with contrast is recommended for better characterization. 2. LI-RADS category LR-5 (definite hepatocellular carcinoma) central in the right hepatic lobe. 3. Several LI-RADS category LR 3 lesions are present scattered in the liver. 4. Prior ablation site in segment 4 adjacent to the falciform ligament. 5. Descending colon diverticulosis. 6. Dextroconvex lumbar scoliosis with lumbar spondylosis and degenerative disc disease. 7.  Aortic Atherosclerosis (ICD10-I70.0).   01/28/2020 Imaging   CT Chest 01/28/20  IMPRESSION: 1. New mass lesion and adjacent nodule in the LEFT lower lobe consistent with lung cancer recurrence. 2. New LEFT hilar lymph nodes consistent with nodal metastasis. 3. New thick-walled cavitary lesion in the RIGHT  upper lobe is concerning for metachronous bronchogenic carcinoma versus metastasis. 4. Additional nodules in the periphery of the RIGHT lower lobe are concerning. 5. Consider FDG PET scan for further evaluation. 6. Hepatocellular carcinoma lesions in the liver described on comparison MRI   02/21/2020 PET scan   PET 02/21/20 IMPRESSION: 1. Left lower lobe pulmonary mass is markedly hypermetabolic, consistent with malignancy. Hypermetabolic metastases are seen in the left hilum and paraesophageal mediastinum. 2. Hypermetabolic cavitary lesion in the right apex is highly suspicious for additional site of malignancy, potentially metastatic disease or metachronous primary. 3. Small area of a nodular soft tissue in the posterior left apex shows low level FDG uptake. Uptake in this region may be related to scarring from prior radiation therapy. Close attention on follow-up recommended. 4. No focal hepatic hypermetabolism in this individual with documented LI-RADS category 5 lesion in the central right liver on recent MRI. 5. Small inferior bilateral parotid nodules show low level hypermetabolism, stable since 2018, but indeterminate by PET imaging today. 6. 3.2 cm diameter infrarenal abdominal aorta. Recommend follow-up ultrasound every 3 years. This recommendation follows ACR consensus guidelines: White Paper of the ACR Incidental Findings Committee II on Vascular Findings. J Am Coll Radiol 2013; 10:789-794. 7.  Emphysema. (ICD10-J43.9)   02/29/2020 Pathology Results   Video Bronchoscopy biopsy 02/29/20   FINAL MICROSCOPIC DIAGNOSIS:   A. LUNG, LLL, TARGET 1, BRUSHING:  - Malignant cells present  - Non-small cell carcinoma  B. LUNG, LLL, TARGET 1, NEEDLE ASPIRATION:  - Malignant cells present  - Non-small cell carcinoma  - See comment   COMMENT:  Only relatively few malignant cells are present on the cellblock.  Immunostains for p40 and TTF-1 are negative by immunostain  for CK 5/6  shows only focal staining within the malignant cells.  This  immunoprofile is not definitive but suggestive of squamous cell  carcinoma.   Dr. Valeta Harms was notified on 03/03/2020.   C. LUNG, LLL, LAVAGE:  FINAL MICROSCOPIC DIAGNOSIS:  - Malignant cells consistent with non-small cell carcinoma   D. LUNG, RUL, TARGET 2, BRUSHING:  - No malignant cells identified   E. LUNG, RUL, TARGET 2, NEEDLE ASPIRATION:  - No malignant cells identified   F. LUNG, RUL, LAVAGE:  FINAL MICROSCOPIC DIAGNOSIS:  - No malignant cells identified   G. LYMPH NODE, 11, FINE NEEDLE ASPIRATION:  - Malignant cells present consistent with non-small cell carcinoma   H. LYMPH NODE, 7, FINE NEEDLE ASPIRATION:  - Malignant cells present consistent with non-small cell carcinoma        03/15/2020 Initial Diagnosis   NSCLC of left lung ([place])   03/15/2020 Cancer Staging   Staging form: Lung, AJCC 8th Edition - Clinical stage from 03/15/2020: Stage IVA (cT2, cN2, cM1a) - Signed by Truitt Merle, MD on 03/15/2020   03/28/2020 - 05/16/2020 Radiation Therapy   Radiation to Primary lung cancer with Dr Isidore Moos       CURRENT THERAPY:  Target SBRT with Dr Isidore Moos 03/28/20-05/16/20  INTERVAL HISTORY:  Adam Bass is here for a follow up. He presents to the clinic with his friend,he came in a wheelchair today.   Tolerating radiation well overall, mildly worsening fatigue, still on same oxygen 4L/min. No chest pain, dyspnea is stable, he is able to walk about 65f without stopping. He is not very active at home due to the cold weather. No pain or other new complains.   All other systems were reviewed with the patient and are negative.  MEDICAL HISTORY:  Past Medical History:  Diagnosis Date  . Anticoagulant long-term use    brillinta  . Bilateral lower extremity edema    feet, noticed since discharge from hospital admission 05-18-2018, "i was laid up in the bed for five days"   . CAD (coronary artery disease)  cardiologist-- dr j. branch   hx STEMI 11-23-2017,  s/p  cardiac cath with Thrombectomy, PCI and DES x1 to midRCA, and other nonobstrucitve CAD involving pLAD, OM1, dRCA, posterior atrio, LVEF 45-50%,  LVEDP 25mg  . [facility]ronic obstructive pulmonary disease) [facility]   pulmonology-- dr wert-- GOLD III mix (pt still smokes),  last exacerbation -due to the flu , [facility] . DOE (dyspnea on exertion)   . Ectatic abdominal aorta ([facility]   MRI 11/ 2019  . Hepatitis C GI-- dr reLaural Golden dx 02-02-2018--- currently treated on antiviral medication  . Hepatocellular carcinoma ([facility]  . History of radiation therapy 06/03/17- 06/09/17   Left Lung treated to 54 Gy with 3 fx of 18 Gy. SBRT  . History of skin cancer    04/ 2017 excision from face  . History of ST elevation myocardial infarction (STEMI) 11/23/2017   inferior wall-- s/p cardiac cath w/ thrombectomy, PCI, and DES  . HLD (hyperlipidemia)   . Hypertension   . Myocardial infarction ([facility]  . 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 Productive cough    per pt mostly clear  . Requires supplemental oxygen    06-09-2018 currently pt is using a portable O2 as needed and at night  . S/P drug eluting coronary stent placement 11/23/2017   DES x1 to midRCA  . Urinary incontinence    wear depends  . Wears dentures    fuller upper,  lower partial  . Wears glasses     SURGICAL HISTORY: Past Surgical History:  Procedure Laterality Date  . BRONCHIAL BIOPSY  02/29/2020   Procedure: BRONCHIAL BIOPSIES;  Surgeon: Garner Nash, DO;  Location: [place] ENDOSCOPY;  Service: Pulmonary;;  . BRONCHIAL BRUSHINGS  02/29/2020   Procedure: BRONCHIAL BRUSHINGS;  Surgeon: Garner Nash, DO;  Location: [place] ENDOSCOPY;  Service: Pulmonary;;  . BRONCHIAL NEEDLE ASPIRATION BIOPSY  02/29/2020    Procedure: BRONCHIAL NEEDLE ASPIRATION BIOPSIES;  Surgeon: Garner Nash, DO;  Location: [place] ENDOSCOPY;  Service: Pulmonary;;  . BRONCHIAL WASHINGS  02/29/2020   Procedure: BRONCHIAL WASHINGS;  Surgeon: Garner Nash, DO;  Location: [place];  Service: Pulmonary;;  . COLONOSCOPY N/A 08/27/2012   Procedure: COLONOSCOPY;  Surgeon: Rogene Houston, MD;  [facility];  Service: Endoscopy;  Laterality: N/A;  1200  . CORONARY STENT INTERVENTION N/A 11/23/2017   Procedure: CORONARY STENT INTERVENTION;  Surgeon: Jettie Booze, MD;  Location: [place] CV LAB;  Service: Cardiovascular;  Laterality: N/A;  . ENDOBRONCHIAL ULTRASOUND N/A 02/29/2020   Procedure: ENDOBRONCHIAL ULTRASOUND;  Surgeon: Garner Nash, DO;  Location: [place];  Service: Pulmonary;  Laterality: N/A;  . FINE NEEDLE ASPIRATION  02/29/2020   Procedure: FINE NEEDLE ASPIRATION;  Surgeon: Garner Nash, DO;  Location: [place] ENDOSCOPY;  Service: Pulmonary;;  . IR RADIOLOGIST EVAL & MGMT  03/04/2018  . IR RADIOLOGIST EVAL & MGMT  10/06/2018  . IR RADIOLOGIST EVAL & MGMT  01/26/2019  . IR RADIOLOGIST EVAL & MGMT  08/11/2019  . IR RADIOLOGIST EVAL & MGMT  01/25/2020  . LEFT HEART CATH AND CORONARY ANGIOGRAPHY N/A 11/23/2017   Procedure: LEFT HEART CATH AND CORONARY ANGIOGRAPHY;  Surgeon: Jettie Booze, MD;  Location: [place] CV LAB;  Service: Cardiovascular;  Laterality: N/A;  . RADIOFREQUENCY ABLATION N/A 06/10/2018   Procedure: MICROWAVE THERMAL ABLATION LIVER;  Surgeon: Aletta Edouard, MD;  Location: [facility];  Service: Anesthesiology;  Laterality: N/A;  . VIDEO BRONCHOSCOPY WITH ENDOBRONCHIAL NAVIGATION Bilateral 02/29/2020   Procedure: VIDEO BRONCHOSCOPY WITH ENDOBRONCHIAL NAVIGATION;  Surgeon: Garner Nash, DO;  Location: [place];  Service: Pulmonary;  Laterality: Bilateral;    I have reviewed the social history and family history with the patient and they are unchanged from previous  note.  ALLERGIES:  is allergic to bee venom.  MEDICATIONS:  Current Outpatient Medications  Medication Sig Dispense Refill  . albuterol (PROAIR HFA) 108 (90 Base) MCG/ACT inhaler Inhale 2 puffs into the lungs every 6 (six) hours as needed for wheezing.     Marland Kitchen ALPRAZolam (XANAX) 0.5 MG tablet Take 0.25-0.5 mg by mouth 3 (three) times daily as needed for anxiety.    . Ascorbic Acid (VITAMIN C PO) Take 1 tablet by mouth daily.    Marland Kitchen aspirin EC 81 MG tablet Take 81 mg by mouth daily.     Marland Kitchen atorvastatin (LIPITOR) 20 MG tablet TAKE 1 TABLET(20 MG) BY MOUTH DAILY 90 tablet 0  .  b complex vitamins capsule Take 1 capsule by mouth daily.    . benzonatate (TESSALON) 200 MG capsule TAKE 1 CAPSULE(200 MG) BY MOUTH THREE TIMES DAILY AS NEEDED FOR COUGH 20 capsule 0  . bisoprolol (ZEBETA) 5 MG tablet TAKE 1/2 TABLET BY MOUTH EVERY DAY (Patient taking differently: Take 2.5 mg by mouth daily. ) 45 tablet 3  . Cholecalciferol (VITAMIN D3 PO) Take 1 capsule by mouth daily.    Marland Kitchen esomeprazole (NEXIUM) 40 MG capsule Take 40 mg by mouth daily.     Marland Kitchen ezetimibe (ZETIA) 10 MG tablet Take 1 tablet (10 mg total) by mouth daily. 90 tablet 1  . famotidine-calcium carbonate-magnesium hydroxide (PEPCID COMPLETE) 10-800-165 MG chewable tablet Chew 1 tablet by mouth daily as needed (acid reflux).    . Fluticasone-Umeclidin-Vilant (TRELEGY ELLIPTA) 100-62.5-25 MCG/INH AEPB INHALE 1 PUFF INTO THE LUNGS EVERY DAY (Patient taking differently: Inhale 1 puff into the lungs daily.) 60 each 0  . hydrocortisone cream 1 % Apply to affected area 2 times daily (Patient taking differently: Apply 1 application topically 2 (two) times daily as needed for itching.) 15 g 0  . lidocaine (XYLOCAINE) 2 % solution Use as directed 10 mLs in the mouth or throat as needed for mouth pain. 200 mL 2  . montelukast (SINGULAIR) 10 MG tablet One at bedtime every night (Patient taking differently: Take 10 mg by mouth at bedtime.) 30 tablet 11  . Multiple  Vitamins-Minerals (MULTIVITAMIN WITH MINERALS) tablet Take 1 tablet by mouth daily.    . Multiple Vitamins-Minerals (ZINC PO) Take 1 tablet by mouth daily.    . nicotine (NICODERM CQ - DOSED IN MG/24 HOURS) 21 mg/24hr patch Place 21 mg onto the skin daily.    . nitroGLYCERIN (NITROSTAT) 0.4 MG SL tablet PLACE 1 TABLET UNDER THE TONGUE EVERY 5 MINUTES AS NEEDED. FOLLOW MD DIRECTIONS FOR WHEN TO CALL 911 (Patient taking differently: Place 0.4 mg under the tongue every 5 (five) minutes as needed for chest pain.) 25 tablet 3  . OXYGEN Inhale into the lungs as needed. 4 Liters supplemental o2 , as needed "when im up moving around"    . oxymetazoline (AFRIN) 0.05 % nasal spray Place 1 spray into both nostrils 2 (two) times daily as needed for congestion.     . Tetrahydrozoline HCl (VISINE OP) Place 1 drop into both eyes daily as needed (irritation).     No current facility-administered medications for this visit.    PHYSICAL EXAMINATION: ECOG PERFORMANCE STATUS: 3 - Symptomatic, >50% confined to bed  Vitals:   05/11/20 1043  BP: 122/61  Pulse: 66  Resp: 17  Temp: 98 F (36.7 C)  SpO2: 98%   Filed Weights   05/11/20 1043  Weight: 132 lb 6.4 oz (60.1 kg)    GENERAL:alert, no distress and comfortable SKIN: skin color, texture, turgor are normal, no rashes or significant lesions EYES: normal, Conjunctiva are pink and non-injected, sclera clear NECK: supple, thyroid normal size, non-tender, without nodularity Musculoskeletal:no cyanosis of digits and no clubbing  NEURO: alert & oriented x 3 with fluent speech, no focal motor/sensory deficits  LABORATORY DATA:  I have reviewed the data as listed CBC Latest Ref Rng & Units 05/11/2020 03/15/2020 02/29/2020  WBC 4.0 - 10.5 K/uL 8.5 11.8(H) 11.1(H)  Hemoglobin 13.0 - 17.0 g/dL 13.6 12.6(L) 12.5(L)  Hematocrit 39.0 - 52.0 % 41.1 38.4(L) 38.7(L)  Platelets 150 - 400 K/uL 280 310 272     CMP Latest Ref Rng & Units 03/15/2020  02/29/2020  01/04/2020  Glucose 70 - 99 mg/dL 103(H) 92 115(H)  BUN 8 - 23 mg/dL _0 Creatinine 0.61 - 1.24 mg/dL 0.67 0.53(L) 0.58(L)  Sodium 135 - 145 mmol/L 140 139 138  Potassium 3.5 - 5.1 mmol/L 4.1 4.3 4.1  Chloride 98 - 111 mmol/L 98 97(L) 96(L)  CO2 22 - 32 mmol/L 34(H) 30 37(H)  Calcium 8.9 - 10.3 mg/dL 9.8 9.2 9.3  Total Protein 6.5 - 8.1 g/dL 7.5 6.9 6.9  Total Bilirubin 0.3 - 1.2 mg/dL 0.5 0.6 0.6  Alkaline Phos 38 - 126 U/L 130(H) 112 -  AST 15 - 41 U/L 36 39 34  ALT 0 - 44 U/L 37 47(H) 41      RADIOGRAPHIC STUDIES: I have personally reviewed the radiological images as listed and agreed with the findings in the report. No results found.   ASSESSMENT & PLAN:  Adam Bass is a 70 y.o. adult with   1. 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 He was treated with target SBRT by Dr Isidore Moos in 05/2017. -He has been followed since by Dr. Isidore Moos and also saw Dr Delton Coombes in 2019. His left lung nodule did get much smaller with stable residual scar tissue until early 2021 -on Surveillance [facility] for [facility], he was found to have an increase left lower lung mass.  -I previously reviewed his PET from 11/29/21with pt and his daughter, alone with the biopsy findings. PET scan showed large hypermetabolic LLL lung mass,with  Hilar and mediastinal adenopathy, both biopsy confirmed SCC. Hypermetabolic cavitary lesion in the right apex is highly suspicious for additional site of malignancy or metastasis, but biopsy was negative.  -I discussed given mediastinal LN involvement and possible right lung metastasis or second primary, he has at least stage III lung cancer, or likely stage IVA disease. I discussed his cancer may not be curable, but still treatable.  -Given his comorbidities and poor performance status, I suggest sequential  treatment with Radiation first, followed by Chemotherapy if he can tolerate. Afterward I recommend adjuvant immunotherapy for one year (which could also help his liver cancer).  -I discussed his PD-L1 on his biopsy which was negative, which predicts less benefit from immunotherapy, although adjuvant Durvalumab has been approved in stage III NSCLC regardless PD-L1 expression    -we also discussed his Guardant 360 test result, which showed no actionable mutations  -we discussed sequential chemo after he recovers well from [place] as weekly chemo carbo and taxol  -He will return to Dr. Tera Helper for his oncology care, I will refer him back   2. COPD, Smoking Cessation  -He has been on continuous supplemental oxygen 4L and occasionally 2L since 04/2018.  -Uses Albuterol as needed and uses Neb treatment  -Continue to F/u with pulmonologist.  -He has long history of smoking. He has reduced smoking form 1.5ppd to 1/2 ppd currently. He uses Nicotine patch to help him reduce and quit. I discussed and strongly encouraged complete smoking cessation.  -He does have ongoing cough with white phlegm.    3. H/o HCC, Hepatitis C -HCC Dx in 2020. Treated by Dr Kathlene Cote by Liver ablation on 06/10/18  -He was treated for Hep C in 2020 -MRI abdomen on 08/03/19 showed slow increase in known liver lesion. AFP has also increased lately.  Dr Kathlene Cote feels if this is slow growing disease it is fine to observe for now while he proceeds with lung cancer treatment, which I agree. -we discussed that immunotherapy may benefit his [place]   4. MI in 2019, CAD, HLD -Stent placed after MI  5. H/o skin cancer  -removed from face 07/2015 -He continues to f/u with dermatologist. Has not been seen since 2020 due to pandemic.    PLAN:  -[id number] and PD-L1 test results discussed -he will complete RT next week -refer him back to Dr. Tera Helper for chemo and f/u   No problem-specific Assessment & Plan notes found for  this encounter.   No orders of the defined types were placed in this encounter.  All questions were answered. The patient knows to call the clinic with any problems, questions or concerns. No barriers to learning was detected. The total time spent in the appointment was 30 minutes.     Truitt Merle, MD 05/11/2020   I, Joslyn Devon, am acting as scribe for Truitt Merle, MD.   I have reviewed the above documentation for accuracy and completeness, and I agree with the above.

## 2020-05-09 ENCOUNTER — Other Ambulatory Visit: Payer: Self-pay | Admitting: Nurse Practitioner

## 2020-05-09 ENCOUNTER — Other Ambulatory Visit: Payer: Self-pay | Admitting: Radiation Oncology

## 2020-05-09 ENCOUNTER — Ambulatory Visit
Admission: RE | Admit: 2020-05-09 | Discharge: 2020-05-09 | Disposition: A | Discharge status: Home / Self Care | Payer: Medicare Other | Source: Ambulatory Visit | Attending: Radiation Oncology | Admitting: Radiation Oncology

## 2020-05-09 DIAGNOSIS — C3492 Malignant neoplasm of unspecified part of left bronchus or lung: Secondary | ICD-10-CM

## 2020-05-09 DIAGNOSIS — R059 Cough, unspecified: Secondary | ICD-10-CM

## 2020-05-09 DIAGNOSIS — C3432 Malignant neoplasm of lower lobe, left bronchus or lung: Secondary | ICD-10-CM

## 2020-05-10 ENCOUNTER — Ambulatory Visit
Admission: RE | Admit: 2020-05-10 | Discharge: 2020-05-10 | Disposition: A | Discharge status: Home / Self Care | Payer: Medicare Other | Source: Ambulatory Visit | Attending: Radiation Oncology | Admitting: Radiation Oncology

## 2020-05-10 ENCOUNTER — Other Ambulatory Visit: Payer: Self-pay

## 2020-05-10 DIAGNOSIS — C3432 Malignant neoplasm of lower lobe, left bronchus or lung: Secondary | ICD-10-CM | POA: Diagnosis not present

## 2020-05-11 ENCOUNTER — Other Ambulatory Visit: Payer: Self-pay | Admitting: Hematology

## 2020-05-11 ENCOUNTER — Inpatient Hospital Stay: Payer: Medicare Other | Attending: Hematology | Admitting: Hematology

## 2020-05-11 ENCOUNTER — Ambulatory Visit
Admission: RE | Admit: 2020-05-11 | Discharge: 2020-05-11 | Disposition: A | Discharge status: Home / Self Care | Payer: Medicare Other | Source: Ambulatory Visit | Attending: Radiation Oncology | Admitting: Radiation Oncology

## 2020-05-11 ENCOUNTER — Encounter: Payer: Self-pay | Admitting: Hematology

## 2020-05-11 ENCOUNTER — Other Ambulatory Visit: Payer: Self-pay

## 2020-05-11 ENCOUNTER — Inpatient Hospital Stay: Payer: Medicare Other

## 2020-05-11 VITALS — BP 122/61 | HR 66 | Temp 98.0°F | Resp 17 | Ht 69.0 in | Wt 132.4 lb

## 2020-05-11 DIAGNOSIS — C3432 Malignant neoplasm of lower lobe, left bronchus or lung: Secondary | ICD-10-CM

## 2020-05-11 DIAGNOSIS — C778 Secondary and unspecified malignant neoplasm of lymph nodes of multiple regions: Secondary | ICD-10-CM | POA: Insufficient documentation

## 2020-05-11 DIAGNOSIS — F17210 Nicotine dependence, cigarettes, uncomplicated: Secondary | ICD-10-CM | POA: Diagnosis not present

## 2020-05-11 DIAGNOSIS — Z993 Dependence on wheelchair: Secondary | ICD-10-CM | POA: Insufficient documentation

## 2020-05-11 DIAGNOSIS — C220 Liver cell carcinoma: Secondary | ICD-10-CM | POA: Insufficient documentation

## 2020-05-11 DIAGNOSIS — J449 Chronic obstructive pulmonary disease, unspecified: Secondary | ICD-10-CM | POA: Diagnosis not present

## 2020-05-11 DIAGNOSIS — Z85828 Personal history of other malignant neoplasm of skin: Secondary | ICD-10-CM | POA: Diagnosis not present

## 2020-05-11 DIAGNOSIS — R918 Other nonspecific abnormal finding of lung field: Secondary | ICD-10-CM | POA: Insufficient documentation

## 2020-05-11 LAB — CBC WITH DIFFERENTIAL (CANCER CENTER ONLY)
Abs Immature Granulocytes: 0.04 10*3/uL (ref 0.00–0.07)
Basophils Absolute: 0.1 10*3/uL (ref 0.0–0.1)
Basophils Relative: 1 %
Eosinophils Absolute: 0.2 10*3/uL (ref 0.0–0.5)
Eosinophils Relative: 2 %
HCT: 41.1 % (ref 39.0–52.0)
Hemoglobin: 13.6 g/dL (ref 13.0–17.0)
Immature Granulocytes: 1 %
Lymphocytes Relative: 11 %
Lymphs Abs: 0.9 10*3/uL (ref 0.7–4.0)
MCH: 33.7 pg (ref 26.0–34.0)
MCHC: 33.1 g/dL (ref 30.0–36.0)
MCV: 101.7 fL — ABNORMAL HIGH (ref 80.0–100.0)
Monocytes Absolute: 0.8 10*3/uL (ref 0.1–1.0)
Monocytes Relative: 10 %
Neutro Abs: 6.4 10*3/uL (ref 1.7–7.7)
Neutrophils Relative %: 75 %
Platelet Count: 280 10*3/uL (ref 150–400)
RBC: 4.04 MIL/uL — ABNORMAL LOW (ref 4.22–5.81)
RDW: 11.7 % (ref 11.5–15.5)
WBC Count: 8.5 10*3/uL (ref 4.0–10.5)
nRBC: 0 % (ref 0.0–0.2)

## 2020-05-11 LAB — CMP (CANCER CENTER ONLY)
ALT: 32 U/L (ref 0–44)
AST: 37 U/L (ref 15–41)
Albumin: 3.6 g/dL (ref 3.5–5.0)
Alkaline Phosphatase: 120 U/L (ref 38–126)
Anion gap: 7 (ref 5–15)
BUN: 18 mg/dL (ref 8–23)
CO2: 35 mmol/L — ABNORMAL HIGH (ref 22–32)
Calcium: 9 mg/dL (ref 8.9–10.3)
Chloride: 95 mmol/L — ABNORMAL LOW (ref 98–111)
Creatinine: 0.7 mg/dL (ref 0.61–1.24)
GFR, Estimated: >60 mL/min (ref >60)
Glucose, Bld: 90 mg/dL (ref 70–99)
Potassium: 4.4 mmol/L (ref 3.5–5.1)
Sodium: 137 mmol/L (ref 135–145)
Total Bilirubin: 0.6 mg/dL (ref 0.3–1.2)
Total Protein: 7.3 g/dL (ref 6.5–8.1)

## 2020-05-11 MED ORDER — TAMSULOSIN HCL 0.4 MG PO CAPS: 0.4000 mg | ORAL_CAPSULE | Freq: Every day | ORAL | 0 refills | Status: AC

## 2020-05-12 ENCOUNTER — Ambulatory Visit
Admission: RE | Admit: 2020-05-12 | Discharge: 2020-05-12 | Disposition: A | Discharge status: Home / Self Care | Payer: Medicare Other | Source: Ambulatory Visit | Attending: Radiation Oncology | Admitting: Radiation Oncology

## 2020-05-12 DIAGNOSIS — C3432 Malignant neoplasm of lower lobe, left bronchus or lung: Secondary | ICD-10-CM | POA: Diagnosis not present

## 2020-05-15 ENCOUNTER — Other Ambulatory Visit: Payer: Self-pay

## 2020-05-15 ENCOUNTER — Ambulatory Visit: Payer: Medicare Other

## 2020-05-15 ENCOUNTER — Ambulatory Visit
Admission: RE | Admit: 2020-05-15 | Discharge: 2020-05-15 | Disposition: A | Discharge status: Home / Self Care | Payer: Medicare Other | Source: Ambulatory Visit | Attending: Radiation Oncology | Admitting: Radiation Oncology

## 2020-05-15 DIAGNOSIS — C3432 Malignant neoplasm of lower lobe, left bronchus or lung: Secondary | ICD-10-CM | POA: Diagnosis not present

## 2020-05-16 ENCOUNTER — Encounter: Payer: Self-pay | Admitting: Radiation Oncology

## 2020-05-16 ENCOUNTER — Ambulatory Visit
Admission: RE | Admit: 2020-05-16 | Discharge: 2020-05-16 | Disposition: A | Discharge status: Home / Self Care | Payer: Medicare Other | Source: Ambulatory Visit | Attending: Radiation Oncology | Admitting: Radiation Oncology

## 2020-05-16 ENCOUNTER — Other Ambulatory Visit: Payer: Self-pay

## 2020-05-16 DIAGNOSIS — C3432 Malignant neoplasm of lower lobe, left bronchus or lung: Secondary | ICD-10-CM | POA: Diagnosis not present

## 2020-05-16 DIAGNOSIS — Z510 Encounter for antineoplastic radiation therapy: Secondary | ICD-10-CM | POA: Diagnosis not present

## 2020-05-18 LAB — MYCOBACTERIA,CULT W/FLUOROCHROME SMEAR
MICRO NUMBER:: 11342344
SMEAR:: NONE SEEN
SPECIMEN QUALITY:: ADEQUATE

## 2020-05-24 ENCOUNTER — Ambulatory Visit: Payer: Medicare Other | Admitting: Infectious Diseases

## 2020-05-31 ENCOUNTER — Inpatient Hospital Stay (HOSPITAL_COMMUNITY): Payer: Medicare Other | Attending: Hematology | Admitting: Hematology

## 2020-05-31 ENCOUNTER — Other Ambulatory Visit: Payer: Self-pay

## 2020-05-31 VITALS — BP 102/62 | HR 79 | Temp 97.3°F | Resp 22 | Wt 130.7 lb

## 2020-05-31 DIAGNOSIS — J449 Chronic obstructive pulmonary disease, unspecified: Secondary | ICD-10-CM | POA: Insufficient documentation

## 2020-05-31 DIAGNOSIS — Z9981 Dependence on supplemental oxygen: Secondary | ICD-10-CM | POA: Diagnosis not present

## 2020-05-31 DIAGNOSIS — Z923 Personal history of irradiation: Secondary | ICD-10-CM | POA: Diagnosis not present

## 2020-05-31 DIAGNOSIS — C3492 Malignant neoplasm of unspecified part of left bronchus or lung: Secondary | ICD-10-CM

## 2020-05-31 DIAGNOSIS — F17200 Nicotine dependence, unspecified, uncomplicated: Secondary | ICD-10-CM | POA: Insufficient documentation

## 2020-05-31 DIAGNOSIS — Z993 Dependence on wheelchair: Secondary | ICD-10-CM | POA: Insufficient documentation

## 2020-05-31 DIAGNOSIS — C3432 Malignant neoplasm of lower lobe, left bronchus or lung: Secondary | ICD-10-CM | POA: Insufficient documentation

## 2020-05-31 NOTE — Patient Instructions (Signed)
[place] at [facility] [place] [facility] Discharge Instructions  You were seen today by Dr. Delton Coombes. He went over your recent results. Continue staying physically active to improve your energy levels and work up an appetite. You will be scheduled to have a CT scan of your chest before your next visit. Dr. Delton Coombes will see you back in 2 weeks for follow up.   Thank you for choosing [place] at [facility] to provide your oncology and hematology care.  To afford each patient quality time with our provider, please arrive at least 15 minutes before your scheduled appointment time.   If you have a lab appointment with the [facility] please come in thru the Main Entrance and check in at the main information desk  You need to re-schedule your appointment should you arrive 10 or more minutes late.  We strive to give you quality time with our providers, and arriving late affects you and other patients whose appointments are after yours.  Also, if you no show three or more times for appointments you may be dismissed from the clinic at the providers discretion.     Again, thank you for choosing [facility].  Our hope is that these requests will decrease the amount of time that you wait before being seen by our physicians.       _____________________________________________________________  Should you have questions after your visit to [facility], please contact our office at [phone] between the hours of 8:00 a.m. and 4:30 p.m.  Voicemails left after 4:00 p.m. will not be returned until the following business day.  For prescription refill requests, have your pharmacy contact our office and allow 72 hours.    Cancer Center Support Programs:   > Cancer Support Group  2nd Tuesday of the month 1pm-2pm, Journey Room

## 2020-05-31 NOTE — Progress Notes (Signed)
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 SBRT to left lung 54 Gy in 3 fractions from 06/03/2017 to 06/09/2017. 2. IMRT to left lung 66 Gy in 33 fractions from 03/29/2020 to 05/16/2020.  NGS Results: Not done  CURRENT THERAPY: Surveillance  BRIEF ONCOLOGIC HISTORY:  Oncology History Overview Note  Cancer Staging Primary cancer of left lower lobe of lung ([place]) Staging form: Lung, AJCC 8th Edition - Clinical stage from 04/30/2017: Stage IA2 (cT1b, cN0, cM0) - Signed by Eppie Gibson, MD on 04/30/2017    Primary cancer of left lower lobe of lung ([place])  09/05/2016 Imaging   CT Chest: Mild apical scarring noted in the right lung.  Diffuse emphysematous changes bilaterally.  Irregular spiculated nodule in the left upper lobe measuring 2.1 cm.   10/02/2016 PET scan   No hypermetabolic lymphadenopathy in the neck.  No hypermetabolic mediastinal, hilar, or axillary lymph nodes.  Spiculated nodule in the left upper lobe measuring 1.9 cm, SUV max 4.5.  0.8 cm nodule in the left lower lobe with SUV max of 1.7.   no evidence of hypermetabolic lesions in the liver, adrenal glands, spleen, or pancreas.  No abdominal or pelvic lymphadenopathy.  Liver margin appears somewhat irregular with possible cirrhosis.   10/22/2016 Pathology Results   LtUL Nodule Bx: Lung with necrosis, inflammation, and focal calcifications.  No evidence of malignancy.   04/02/2017 PET scan   Left lower lobe lesion measuring 1.3 cm up from 0.8 cm previously, SUV max of 2.9 up from 1.7 in the past.  Left upper lobe nodule SUV max of 3.6 down from 4.5 previously, measuring 2.2 cm, 2.1 cm previously.  Bilateral hypermetabolic carotid lesions noted.    04/10/2017 Initial Diagnosis   Primary cancer of left lower lobe of lung ([place])   04/10/2017 Pathology Results   LtLL Lung Bx: Positive for presence of malignant cells, consistent with squamous cell carcinoma. IHC -- positive for CK5/6, p63 & negative for TTF-1 & Napsin-A   05/2017 - 06/09/2017 Radiation Therapy   3 doses of SBRT to Lung by Dr Isidore Moos   08/03/2019 Imaging   MRI Abdomen 08/03/19 IMPRESSION: Limited evaluation due to severe motion degradation.   Cirrhosis.  Stable ablation zone in segment 4A.   11 mm enhancing lesion in segment 8 (LI-RADS 3). Attention on follow-up is suggested. Given degree of motion degradation on the current study, consider liver protocol CT (with/without contrast) in 6 months.   Additional stable ancillary findings as above.IMPRESSION: Limited evaluation due to severe motion degradation.   Cirrhosis.  Stable ablation zone in segment 4A.   11 mm enhancing lesion in segment 8 (LI-RADS 3). Attention on follow-up is suggested. Given degree of motion degradation on the current study, consider liver protocol CT (with/without contrast) in 6 months.   Additional stable ancillary findings as above.   Hepatocellular carcinoma ([place])  06/10/2018 Initial Diagnosis   Hepatocellular carcinoma ([place])   06/10/2018 Procedure   Liver Ablation by Dr Kathlene Cote    10/02/2018 Imaging   MRI Abdomen  IMPRESSION: 1. No evidence hepatocellular carcinoma recurrence in the LEFT hepatic lobe at thermal ablation site. 2. Marginal enhancing lesion in the superior RIGHT hepatic lobe (segment 8) without washout is  slightly enlarged from comparison exam. LiRAD- 3. Recommend close attention on follow- 3. Additional smaller enhancing lesions LEFT and RIGHT hepatic show arterial enhancement but no delayed visibility. Potential dysplastic nodules.     08/03/2019 Imaging   MRI Abdomen  IMPRESSION: Limited evaluation due to severe motion degradation.   Cirrhosis.  Stable  ablation zone in segment 4A.   11 mm enhancing lesion in segment 8 (LI-RADS 3). Attention on follow-up is suggested. Given degree of motion degradation on the current study, consider liver protocol CT (with/without contrast) in 6 months.   Additional stable ancillary findings as above.     01/19/2020 Imaging   MRI Liver 01/19/20 IMPRESSION: 1. New or progressive 3.0 by 2.1 cm left lower lobe lung mass with adjacent 2.3 by 1.3 cm left lower lobe nodule. Appearance is highly concerning for malignancy and chest CT with contrast is recommended for better characterization. 2. LI-RADS category LR-5 (definite hepatocellular carcinoma) central in the right hepatic lobe. 3. Several LI-RADS category LR 3 lesions are present scattered in the liver. 4. Prior ablation site in segment 4 adjacent to the falciform ligament. 5. Descending colon diverticulosis. 6. Dextroconvex lumbar scoliosis with lumbar spondylosis and degenerative disc disease. 7.  Aortic Atherosclerosis (ICD10-I70.0).   NSCLC of left lung ([place])  01/19/2020 Imaging   MRI Liver 01/19/20 IMPRESSION: 1. New or progressive 3.0 by 2.1 cm left lower lobe lung mass with adjacent 2.3 by 1.3 cm left lower lobe nodule. Appearance is highly concerning for malignancy and chest CT with contrast is recommended for better characterization. 2. LI-RADS category LR-5 (definite hepatocellular carcinoma) central in the right hepatic lobe. 3. Several LI-RADS category LR 3 lesions are present scattered in the liver. 4. Prior ablation site in segment 4 adjacent to the falciform ligament. 5. Descending colon diverticulosis. 6. Dextroconvex lumbar scoliosis with lumbar spondylosis and degenerative disc disease. 7.  Aortic Atherosclerosis (ICD10-I70.0).   01/28/2020 Imaging   CT Chest 01/28/20  IMPRESSION: 1. New mass lesion and adjacent nodule in the LEFT lower lobe consistent with lung cancer recurrence. 2. New LEFT hilar lymph nodes  consistent with nodal metastasis. 3. New thick-walled cavitary lesion in the RIGHT upper lobe is concerning for metachronous bronchogenic carcinoma versus metastasis. 4. Additional nodules in the periphery of the RIGHT lower lobe are concerning. 5. Consider FDG PET scan for further evaluation. 6. Hepatocellular carcinoma lesions in the liver described on comparison MRI   02/21/2020 PET scan   PET 02/21/20 IMPRESSION: 1. Left lower lobe pulmonary mass is markedly hypermetabolic, consistent with malignancy. Hypermetabolic metastases are seen in the left hilum and paraesophageal mediastinum. 2. Hypermetabolic cavitary lesion in the right apex is highly suspicious for additional site of malignancy, potentially metastatic disease or metachronous primary. 3. Small area of a nodular soft tissue in the posterior left apex shows low level FDG uptake. Uptake in this region may be related to scarring from prior radiation therapy. Close attention on follow-up recommended. 4. No focal hepatic hypermetabolism in this individual with documented LI-RADS category 5 lesion in the central right liver on recent MRI. 5. Small inferior bilateral parotid nodules show low level hypermetabolism, stable since 2018, but indeterminate by PET imaging today. 6. 3.2 cm diameter infrarenal abdominal aorta. Recommend follow-up ultrasound every 3 years. This recommendation follows ACR consensus guidelines: White Paper of the ACR Incidental Findings Committee II on Vascular Findings. J Am Coll Radiol 2013; 10:789-794. 7.  Emphysema. (ICD10-J43.9)   02/29/2020 Pathology Results   Video Bronchoscopy biopsy 02/29/20  FINAL MICROSCOPIC DIAGNOSIS:   A. LUNG, LLL, TARGET 1, BRUSHING:  - Malignant cells present  - Non-small cell carcinoma   B. LUNG, LLL, TARGET 1, NEEDLE ASPIRATION:  - Malignant cells present  - Non-small cell carcinoma  - See comment   COMMENT:  Only relatively few malignant cells are  present on the cellblock.  Immunostains for p40 and TTF-1 are negative by immunostain for CK 5/6  shows only focal staining within the malignant cells.  This  immunoprofile is not definitive but suggestive of squamous cell  carcinoma.   Dr. Valeta Harms was notified on 03/03/2020.   C. LUNG, LLL, LAVAGE:  FINAL MICROSCOPIC DIAGNOSIS:  - Malignant cells consistent with non-small cell carcinoma   D. LUNG, RUL, TARGET 2, BRUSHING:  - No malignant cells identified   E. LUNG, RUL, TARGET 2, NEEDLE ASPIRATION:  - No malignant cells identified   F. LUNG, RUL, LAVAGE:  FINAL MICROSCOPIC DIAGNOSIS:  - No malignant cells identified   G. LYMPH NODE, 11, FINE NEEDLE ASPIRATION:  - Malignant cells present consistent with non-small cell carcinoma   H. LYMPH NODE, 7, FINE NEEDLE ASPIRATION:  - Malignant cells present consistent with non-small cell carcinoma        03/15/2020 Initial Diagnosis   NSCLC of left lung ([place])   03/15/2020 Cancer Staging   Staging form: Lung, AJCC 8th Edition - Clinical stage from 03/15/2020: Stage IVA (cT2, cN2, cM1a) - Signed by Truitt Merle, MD on 03/15/2020   03/28/2020 - 05/16/2020 Radiation Therapy   Radiation to Primary lung cancer with Dr Isidore Moos      CANCER STAGING: Cancer Staging NSCLC of left lung [facility]) Staging form: Lung, AJCC 8th Edition - Clinical stage from 03/15/2020: Stage IVA (cT2, cN2, cM1a) - Signed by Truitt Merle, MD on 03/15/2020  Primary cancer of left lower lobe of lung [facility]) Staging form: Lung, AJCC 8th Edition - Clinical stage from 04/30/2017: Stage IA2 (cT1b, cN0, cM0) - Signed by Eppie Gibson, MD on 04/30/2017   INTERVAL HISTORY:  Mr. ANDREU DRUDGE, a 70 y.o. adult, returns for routine follow-up of his squamous cell cancer of left lung. Adam Bass was last seen by Dr. Truitt Merle on 05/11/2020.   Today he is accompanied by his daughter and he reports feeling poorly. He continues having fatigue since finishing his radiation. He has lost 5-6 pounds  over the course of radiation. He has COPD and is on constant 4-5 L via [place], along with occasional cough and SOB; he can walk about 25 feet until he becomes fatigued and SOB. He notes that he is not as hungry since finishing radiation. He went to ID for his right upper lobe lung mass and was cleared of having a MAC infection. He denies having N/V, trouble swallowing, headaches or vision changes.  He lives alone and is mainly sedentary nowadays, though he is able to do his ADL's. He has reduced his driving time recently.   REVIEW OF SYSTEMS:  Review of Systems  Constitutional: Positive for appetite change (50%), fatigue (25%) and unexpected weight change (lost 6-7 lbs over XRT).  HENT:   Negative for trouble swallowing.   Eyes: Negative for eye problems.  Respiratory: Positive for cough and shortness of breath.   Gastrointestinal: Negative for nausea and vomiting.  Neurological: Positive for numbness (tingling in feet). Negative for headaches.  All other systems reviewed and are negative.   PAST MEDICAL/SURGICAL HISTORY:  Past Medical History:  Diagnosis Date  . Anticoagulant long-term use  brillinta  . Bilateral lower extremity edema    feet, noticed since discharge from hospital admission 05-18-2018, "i was laid up in the bed for five days"   . CAD (coronary artery disease) cardiologist-- dr j. branch   hx STEMI 11-23-2017,  s/p  cardiac cath with Thrombectomy, PCI and DES x1 to midRCA, and other nonobstrucitve CAD involving pLAD, OM1, dRCA, posterior atrio, LVEF 45-50%,  LVEDP 65mHg  . COPD (chronic obstructive pulmonary disease) with emphysema ([facility]    pulmonology-- dr wert-- GOLD III mix (pt still smokes),  last exacerbation -due to the flu , [facility]  . DOE (dyspnea on exertion)   . Ectatic abdominal aorta ([place]    MRI 11/ 2019  . Hepatitis C GI-- dr rLaural Golden  dx 02-02-2018--- currently treated on antiviral medication  . Hepatocellular [place]    . History of radiation therapy 06/03/17- 06/09/17   Left Lung treated to 54 Gy with 3 fx of 18 Gy. SBRT  . History of skin cancer    04/ 2017 excision from face  . History of ST elevation myocardial infarction (STEMI) 11/23/2017   inferior wall-- s/p cardiac cath w/ thrombectomy, PCI, and DES  . HLD (hyperlipidemia)   . Hypertension   . Myocardial infarction ([place]   . Primary squamous cell carcinoma of left lung ([facility] oncologist-- dr sIsidore Moos  dx 02/ 2018-- Stage IA2 Left lower lung,  s/p  Stereotactic radiation completed 06-09-2017  . Productive cough    per pt mostly clear  . Requires supplemental oxygen    06-09-2018 currently pt is using a portable O2 as needed and at night  . S/P drug eluting coronary stent placement 11/23/2017   DES x1 to midRCA  . Urinary incontinence    wear depends  . Wears dentures    fuller upper,  lower partial  . Wears glasses    Past Surgical History:  Procedure Laterality Date  . BRONCHIAL BIOPSY  02/29/2020   Procedure: BRONCHIAL BIOPSIES;  Surgeon: IGarner Nash DO;  [facility];  Service: Pulmonary;;  . BRONCHIAL BRUSHINGS  02/29/2020   Procedure: BRONCHIAL BRUSHINGS;  Surgeon: IGarner Nash DO;  [place];  Service: Pulmonary;;  . BRONCHIAL NEEDLE ASPIRATION BIOPSY  02/29/2020   Procedure: BRONCHIAL NEEDLE ASPIRATION BIOPSIES;  Surgeon: IGarner Nash DO;  [facility];  Service: Pulmonary;;  . BRONCHIAL WASHINGS  02/29/2020   Procedure: BRONCHIAL WASHINGS;  Surgeon: IGarner Nash DO;  [place]  Service: Pulmonary;;  . COLONOSCOPY N/A 08/27/2012   Procedure: COLONOSCOPY;  Surgeon: NRogene Houston MD;  [facility];  Service: Endoscopy;  Laterality: N/A;  1200  . CORONARY STENT INTERVENTION N/A 11/23/2017   Procedure: CORONARY STENT INTERVENTION;  Surgeon: VJettie Booze MD;  [facility];  Service: Cardiovascular;  Laterality: N/A;  . ENDOBRONCHIAL  ULTRASOUND N/A 02/29/2020   Procedure: ENDOBRONCHIAL ULTRASOUND;  Surgeon: IGarner Nash DO;  [place]  Service: Pulmonary;  Laterality: N/A;  . FINE NEEDLE ASPIRATION  02/29/2020   Procedure: FINE NEEDLE ASPIRATION;  Surgeon: IGarner Nash DO;  [place];  Service: Pulmonary;;  . IR RADIOLOGIST EVAL & MGMT  03/04/2018  . IR RADIOLOGIST EVAL & MGMT  10/06/2018  . IR RADIOLOGIST EVAL & MGMT  01/26/2019  . IR RADIOLOGIST EVAL & MGMT  08/11/2019  . IR RADIOLOGIST EVAL & MGMT  01/25/2020  . LEFT HEART CATH AND  CORONARY ANGIOGRAPHY N/A 11/23/2017   Procedure: LEFT HEART CATH AND CORONARY ANGIOGRAPHY;  Surgeon: Jettie Booze, MD;  Location: [place] CV LAB;  Service: Cardiovascular;  Laterality: N/A;  . RADIOFREQUENCY ABLATION N/A 06/10/2018   Procedure: MICROWAVE THERMAL ABLATION LIVER;  Surgeon: Aletta Edouard, MD;  Location: [facility];  Service: Anesthesiology;  Laterality: N/A;  . VIDEO BRONCHOSCOPY WITH ENDOBRONCHIAL NAVIGATION Bilateral 02/29/2020   Procedure: VIDEO BRONCHOSCOPY WITH ENDOBRONCHIAL NAVIGATION;  Surgeon: Garner Nash, DO;  Location: [place];  Service: Pulmonary;  Laterality: Bilateral;    SOCIAL HISTORY:  Social History   Socioeconomic History  . Marital status: Divorced    Spouse name: Not on file  . Number of children: 2  . Years of education: Not on file  . Highest education level: Not on file  Occupational History  . Not on file  Tobacco Use  . Smoking status: Current Every Day Smoker    Packs/day: 0.75    Years: 50.00    Pack years: 37.50    Types: Cigars  . Smokeless tobacco: Never Used  . Tobacco comment: . He is smoking about 5-10 cigarettes daily.   Vaping Use  . Vaping Use: Former  . Quit date: 10/07/2017  Substance and Sexual Activity  . Alcohol use: Yes    Alcohol/week: 8.0 standard drinks    Types: 8 Cans of beer per week    Comment: 8 beers weekly, give or take   . Drug use: No  . Sexual  activity: Not on file  Other Topics Concern  . Not on file  Social History Narrative  . Not on file   Social Determinants of Health   Financial Resource Strain: Not on file  Food Insecurity: Not on file  Transportation Needs: Not on file  Physical Activity: Not on file  Stress: Not on file  Social Connections: Not on file  Intimate Partner Violence: Not on file    FAMILY HISTORY:  Family History  Problem Relation Age of Onset  . Breast cancer Sister   . Colon cancer Neg Hx     CURRENT MEDICATIONS:  Current Outpatient Medications  Medication Sig Dispense Refill  . albuterol (PROAIR HFA) 108 (90 Base) MCG/ACT inhaler Inhale 2 puffs into the lungs every 6 (six) hours as needed for wheezing.     Marland Kitchen ALPRAZolam (XANAX) 0.5 MG tablet Take 0.25-0.5 mg by mouth 3 (three) times daily as needed for anxiety.    . Ascorbic Acid (VITAMIN C PO) Take 1 tablet by mouth daily.    Marland Kitchen aspirin EC 81 MG tablet Take 81 mg by mouth daily.     Marland Kitchen atorvastatin (LIPITOR) 20 MG tablet TAKE 1 TABLET(20 MG) BY MOUTH DAILY 90 tablet 0  . b complex vitamins capsule Take 1 capsule by mouth daily.    . benzonatate (TESSALON) 200 MG capsule TAKE 1 CAPSULE(200 MG) BY MOUTH THREE TIMES DAILY AS NEEDED FOR COUGH 20 capsule 0  . bisoprolol (ZEBETA) 5 MG tablet TAKE 1/2 TABLET BY MOUTH EVERY DAY (Patient taking differently: Take 2.5 mg by mouth daily. ) 45 tablet 3  . Cholecalciferol (VITAMIN D3 PO) Take 1 capsule by mouth daily.    Marland Kitchen esomeprazole (NEXIUM) 40 MG capsule Take 40 mg by mouth daily.     Marland Kitchen ezetimibe (ZETIA) 10 MG tablet Take 1 tablet (10 mg total) by mouth daily. 90 tablet 1  . famotidine-calcium carbonate-magnesium hydroxide (PEPCID COMPLETE) 10-800-165 MG chewable tablet Chew 1 tablet by mouth daily as needed (  acid reflux).    . Fluticasone-Umeclidin-Vilant (TRELEGY ELLIPTA) 100-62.5-25 MCG/INH AEPB INHALE 1 PUFF INTO THE LUNGS EVERY DAY (Patient taking differently: Inhale 1 puff into the lungs daily.) 60  each 0  . hydrocortisone cream 1 % Apply to affected area 2 times daily (Patient taking differently: Apply 1 application topically 2 (two) times daily as needed for itching.) 15 g 0  . lidocaine (XYLOCAINE) 2 % solution Use as directed 10 mLs in the mouth or throat as needed for mouth pain. 200 mL 2  . montelukast (SINGULAIR) 10 MG tablet One at bedtime every night (Patient taking differently: Take 10 mg by mouth at bedtime.) 30 tablet 11  . Multiple Vitamins-Minerals (MULTIVITAMIN WITH MINERALS) tablet Take 1 tablet by mouth daily.    . Multiple Vitamins-Minerals (ZINC PO) Take 1 tablet by mouth daily.    . nicotine (NICODERM CQ - DOSED IN MG/24 HOURS) 21 mg/24hr patch Place 21 mg onto the skin daily.    . nitroGLYCERIN (NITROSTAT) 0.4 MG SL tablet PLACE 1 TABLET UNDER THE TONGUE EVERY 5 MINUTES AS NEEDED. FOLLOW MD DIRECTIONS FOR WHEN TO CALL 911 (Patient taking differently: Place 0.4 mg under the tongue every 5 (five) minutes as needed for chest pain.) 25 tablet 3  . OXYGEN Inhale into the lungs as needed. 4 Liters supplemental o2 , as needed "when im up moving around"    . oxymetazoline (AFRIN) 0.05 % nasal spray Place 1 spray into both nostrils 2 (two) times daily as needed for congestion.     . tamsulosin (FLOMAX) 0.4 MG CAPS capsule Take 1 capsule (0.4 mg total) by mouth daily. 30 capsule 0  . Tetrahydrozoline HCl (VISINE OP) Place 1 drop into both eyes daily as needed (irritation).     No current facility-administered medications for this visit.    ALLERGIES:  Allergies  Allergen Reactions  . Bee Venom Hives    PHYSICAL EXAM:  Performance status (ECOG): 2 - Symptomatic, <50% confined to bed  Vitals:   05/31/20 0952  BP: 102/62  Pulse: 79  Resp: (!) 22  Temp: (!) 97.3 F (36.3 C)  SpO2: 96%   Wt Readings from Last 3 Encounters:  05/31/20 130 lb 11.7 oz (59.3 kg)  05/11/20 132 lb 6.4 oz (60.1 kg)  03/21/20 134 lb 12.8 oz (61.1 kg)   Physical Exam Vitals reviewed.   Constitutional:      Appearance: Normal appearance.     Interventions: Nasal cannula in place.     Comments: In wheelchair  Cardiovascular:     Rate and Rhythm: Normal rate and regular rhythm.     Pulses: Normal pulses.     Heart sounds: Normal heart sounds.  Pulmonary:     Effort: Pulmonary effort is normal.     Breath sounds: Normal breath sounds.  Abdominal:     Palpations: Abdomen is soft. There is no mass.     Tenderness: There is no abdominal tenderness.  Neurological:     General: No focal deficit present.     Mental Status: He is alert and oriented to person, place, and time.  Psychiatric:        Mood and Affect: Mood normal.        Behavior: Behavior normal.      LABORATORY DATA:  I have reviewed the labs as listed.  CBC Latest Ref Rng & Units 05/11/2020 03/15/2020 02/29/2020  WBC 4.0 - 10.5 K/uL 8.5 11.8(H) 11.1(H)  Hemoglobin 13.0 - 17.0 g/dL 13.6 12.6(L) 12.5(L)  Hematocrit 39.0 - 52.0 % 41.1 38.4(L) 38.7(L)  Platelets 150 - 400 K/uL 280 310 272   CMP Latest Ref Rng & Units 05/11/2020 03/15/2020 02/29/2020  Glucose 70 - 99 mg/dL 90 103(H) 92  BUN 8 - 23 mg/dL _0 Creatinine 0.61 - 1.24 mg/dL 0.70 0.67 0.53(L)  Sodium 135 - 145 mmol/L 137 140 139  Potassium 3.5 - 5.1 mmol/L 4.4 4.1 4.3  Chloride 98 - 111 mmol/L 95(L) 98 97(L)  CO2 22 - 32 mmol/L 35(H) 34(H) 30  Calcium 8.9 - 10.3 mg/dL 9.0 9.8 9.2  Total Protein 6.5 - 8.1 g/dL 7.3 7.5 6.9  Total Bilirubin 0.3 - 1.2 mg/dL 0.6 0.5 0.6  Alkaline Phos 38 - 126 U/L 120 130(H) 112  AST 15 - 41 U/L 37 36 39  ALT 0 - 44 U/L 32 37 47(H)    DIAGNOSTIC IMAGING:  I have independently reviewed the scans and discussed with the patient. No results found.   ASSESSMENT:  1.  Squamous cell carcinoma of lower lobe of left lung: -Initial diagnosis with stage Ia squamous cell carcinoma of the left lung in December 2018, SBRT by Dr. Isidore Moos in February 2019. -PET scan on 03/13/2020 with large hypermetabolic left lower  lobe lung mass, hilar and mediastinal adenopathy, both biopsy confirmed on 02/29/2020, FNA of station 11 and 7 lymph nodes consistent with non-small cell lung cancer.  Hypermetabolic cavitary lesion in the right apex biopsy was negative.  Thought to be secondary to MAC infection. -Given his comorbidities and poor performance status, he received radiation therapy to the left lung and lymph nodes from 03/28/2020 through 05/16/2020. -PD-L1 CPS-0%. -Guardant 360 did not show any actionable mutations.  It showed ATM mutation.  2.  [place] with history of hepatitis C: -[place] diagnosed in 2020, treated by Dr. Kathlene Cote by liver ablation on 06/10/2018.  He was treated for hep C in 2020. -MRI of the abdomen on 08/03/2019 showed increase in known liver lesion.  AFP also increased.  Dr. Kathlene Cote felt it is a slow-growing disease and it was fine to observe for now and proceed with lung cancer treatment.  3.  COPD: -He is on continuous supplemental oxygen 4 to 5 L.   PLAN:  1.  Stage IV squamous cell lung cancer: -Completed radiation therapy to the left lung and lymph nodes from 03/28/2020 through 05/16/2020. -I have reviewed all the records and testing results. -Recommended restaging CT scan of the chest with contrast in 2 to 3 weeks. -We will make a plan about treatment after reviewing the CT scans.  2.  [place] with history of hepatitis C: -Continue intermittent monitoring with scans.    Orders placed this encounter:  No orders of the defined types were placed in this encounter.   Derek Jack, MD [place] [phone]   I, Milinda Antis, am acting as a scribe for Dr. Sanda Linger.  I, Derek Jack MD, have reviewed the above documentation for accuracy and completeness, and I agree with the above.

## 2020-06-07 ENCOUNTER — Emergency Department (HOSPITAL_COMMUNITY): Payer: Medicare Other

## 2020-06-07 ENCOUNTER — Other Ambulatory Visit: Payer: Self-pay

## 2020-06-07 ENCOUNTER — Encounter (HOSPITAL_COMMUNITY): Payer: Self-pay | Admitting: Emergency Medicine

## 2020-06-07 ENCOUNTER — Inpatient Hospital Stay (HOSPITAL_COMMUNITY)
Admission: EM | Admit: 2020-06-07 | Discharge: 2020-06-11 | DRG: 054 | Disposition: A | Discharge status: Hospice | Payer: Medicare Other | Attending: Internal Medicine | Admitting: Internal Medicine

## 2020-06-07 DIAGNOSIS — I10 Essential (primary) hypertension: Secondary | ICD-10-CM | POA: Diagnosis present

## 2020-06-07 DIAGNOSIS — Z803 Family history of malignant neoplasm of breast: Secondary | ICD-10-CM

## 2020-06-07 DIAGNOSIS — F17290 Nicotine dependence, other tobacco product, uncomplicated: Secondary | ICD-10-CM | POA: Diagnosis present

## 2020-06-07 DIAGNOSIS — Z66 Do not resuscitate: Secondary | ICD-10-CM | POA: Diagnosis present

## 2020-06-07 DIAGNOSIS — K219 Gastro-esophageal reflux disease without esophagitis: Secondary | ICD-10-CM | POA: Diagnosis present

## 2020-06-07 DIAGNOSIS — Z955 Presence of coronary angioplasty implant and graft: Secondary | ICD-10-CM | POA: Diagnosis not present

## 2020-06-07 DIAGNOSIS — R5381 Other malaise: Secondary | ICD-10-CM | POA: Diagnosis present

## 2020-06-07 DIAGNOSIS — Z79899 Other long term (current) drug therapy: Secondary | ICD-10-CM | POA: Diagnosis not present

## 2020-06-07 DIAGNOSIS — Z923 Personal history of irradiation: Secondary | ICD-10-CM

## 2020-06-07 DIAGNOSIS — Z20822 Contact with and (suspected) exposure to covid-19: Secondary | ICD-10-CM | POA: Diagnosis present

## 2020-06-07 DIAGNOSIS — C799 Secondary malignant neoplasm of unspecified site: Secondary | ICD-10-CM | POA: Diagnosis present

## 2020-06-07 DIAGNOSIS — I252 Old myocardial infarction: Secondary | ICD-10-CM

## 2020-06-07 DIAGNOSIS — R262 Difficulty in walking, not elsewhere classified: Secondary | ICD-10-CM | POA: Diagnosis present

## 2020-06-07 DIAGNOSIS — G936 Cerebral edema: Secondary | ICD-10-CM | POA: Diagnosis present

## 2020-06-07 DIAGNOSIS — Z7189 Other specified counseling: Secondary | ICD-10-CM | POA: Diagnosis not present

## 2020-06-07 DIAGNOSIS — E785 Hyperlipidemia, unspecified: Secondary | ICD-10-CM | POA: Diagnosis present

## 2020-06-07 DIAGNOSIS — Z7982 Long term (current) use of aspirin: Secondary | ICD-10-CM

## 2020-06-07 DIAGNOSIS — I614 Nontraumatic intracerebral hemorrhage in cerebellum: Secondary | ICD-10-CM | POA: Diagnosis present

## 2020-06-07 DIAGNOSIS — R569 Unspecified convulsions: Secondary | ICD-10-CM | POA: Diagnosis present

## 2020-06-07 DIAGNOSIS — Z91030 Bee allergy status: Secondary | ICD-10-CM

## 2020-06-07 DIAGNOSIS — G939 Disorder of brain, unspecified: Secondary | ICD-10-CM | POA: Diagnosis present

## 2020-06-07 DIAGNOSIS — Z9981 Dependence on supplemental oxygen: Secondary | ICD-10-CM | POA: Diagnosis not present

## 2020-06-07 DIAGNOSIS — B1920 Unspecified viral hepatitis C without hepatic coma: Secondary | ICD-10-CM | POA: Diagnosis present

## 2020-06-07 DIAGNOSIS — C220 Liver cell carcinoma: Secondary | ICD-10-CM | POA: Diagnosis present

## 2020-06-07 DIAGNOSIS — I2510 Atherosclerotic heart disease of native coronary artery without angina pectoris: Secondary | ICD-10-CM | POA: Diagnosis present

## 2020-06-07 DIAGNOSIS — C3432 Malignant neoplasm of lower lobe, left bronchus or lung: Secondary | ICD-10-CM | POA: Diagnosis present

## 2020-06-07 DIAGNOSIS — C3490 Malignant neoplasm of unspecified part of unspecified bronchus or lung: Secondary | ICD-10-CM | POA: Diagnosis not present

## 2020-06-07 DIAGNOSIS — F419 Anxiety disorder, unspecified: Secondary | ICD-10-CM | POA: Diagnosis present

## 2020-06-07 DIAGNOSIS — C7931 Secondary malignant neoplasm of brain: Secondary | ICD-10-CM | POA: Diagnosis present

## 2020-06-07 DIAGNOSIS — J9621 Acute and chronic respiratory failure with hypoxia: Secondary | ICD-10-CM | POA: Diagnosis not present

## 2020-06-07 DIAGNOSIS — Z515 Encounter for palliative care: Secondary | ICD-10-CM

## 2020-06-07 DIAGNOSIS — J439 Emphysema, unspecified: Secondary | ICD-10-CM | POA: Diagnosis present

## 2020-06-07 DIAGNOSIS — I629 Nontraumatic intracranial hemorrhage, unspecified: Secondary | ICD-10-CM

## 2020-06-07 DIAGNOSIS — B370 Candidal stomatitis: Secondary | ICD-10-CM | POA: Diagnosis present

## 2020-06-07 DIAGNOSIS — Z7901 Long term (current) use of anticoagulants: Secondary | ICD-10-CM | POA: Diagnosis not present

## 2020-06-07 DIAGNOSIS — R0602 Shortness of breath: Secondary | ICD-10-CM

## 2020-06-07 DIAGNOSIS — J449 Chronic obstructive pulmonary disease, unspecified: Secondary | ICD-10-CM | POA: Diagnosis not present

## 2020-06-07 DIAGNOSIS — Z85828 Personal history of other malignant neoplasm of skin: Secondary | ICD-10-CM

## 2020-06-07 DIAGNOSIS — G9389 Other specified disorders of brain: Secondary | ICD-10-CM | POA: Diagnosis not present

## 2020-06-07 DIAGNOSIS — R531 Weakness: Secondary | ICD-10-CM | POA: Diagnosis not present

## 2020-06-07 DIAGNOSIS — R0902 Hypoxemia: Secondary | ICD-10-CM | POA: Diagnosis present

## 2020-06-07 DIAGNOSIS — E871 Hypo-osmolality and hyponatremia: Secondary | ICD-10-CM | POA: Diagnosis not present

## 2020-06-07 LAB — DIFFERENTIAL
Abs Immature Granulocytes: 0.02 10*3/uL (ref 0.00–0.07)
Basophils Absolute: 0 10*3/uL (ref 0.0–0.1)
Basophils Relative: 0 %
Eosinophils Absolute: 0 10*3/uL (ref 0.0–0.5)
Eosinophils Relative: 0 %
Immature Granulocytes: 0 %
Lymphocytes Relative: 6 %
Lymphs Abs: 0.6 10*3/uL — ABNORMAL LOW (ref 0.7–4.0)
Monocytes Absolute: 0.3 10*3/uL (ref 0.1–1.0)
Monocytes Relative: 3 %
Neutro Abs: 8.5 10*3/uL — ABNORMAL HIGH (ref 1.7–7.7)
Neutrophils Relative %: 91 %

## 2020-06-07 LAB — COMPREHENSIVE METABOLIC PANEL
ALT: 31 U/L (ref 0–44)
AST: 33 U/L (ref 15–41)
Albumin: 3.7 g/dL (ref 3.5–5.0)
Alkaline Phosphatase: 100 U/L (ref 38–126)
Anion gap: 12 (ref 5–15)
BUN: 17 mg/dL (ref 8–23)
CO2: 30 mmol/L (ref 22–32)
Calcium: 9.2 mg/dL (ref 8.9–10.3)
Chloride: 94 mmol/L — ABNORMAL LOW (ref 98–111)
Creatinine, Ser: 0.48 mg/dL — ABNORMAL LOW (ref 0.61–1.24)
GFR, Estimated: >60 mL/min (ref >60)
Glucose, Bld: 160 mg/dL — ABNORMAL HIGH (ref 70–99)
Potassium: 3.7 mmol/L (ref 3.5–5.1)
Sodium: 136 mmol/L (ref 135–145)
Total Bilirubin: 0.8 mg/dL (ref 0.3–1.2)
Total Protein: 7.3 g/dL (ref 6.5–8.1)

## 2020-06-07 LAB — I-STAT CHEM 8, ED
BUN: 19 mg/dL (ref 8–23)
Calcium, Ion: 1.16 mmol/L (ref 1.15–1.40)
Chloride: 94 mmol/L — ABNORMAL LOW (ref 98–111)
Creatinine, Ser: 0.5 mg/dL — ABNORMAL LOW (ref 0.61–1.24)
Glucose, Bld: 156 mg/dL — ABNORMAL HIGH (ref 70–99)
HCT: 43 % (ref 39.0–52.0)
Hemoglobin: 14.6 g/dL (ref 13.0–17.0)
Potassium: 3.7 mmol/L (ref 3.5–5.1)
Sodium: 137 mmol/L (ref 135–145)
TCO2: 33 mmol/L — ABNORMAL HIGH (ref 22–32)

## 2020-06-07 LAB — RESP PANEL BY RT-PCR (FLU A&B, COVID) ARPGX2
Influenza A by PCR: NEGATIVE
Influenza B by PCR: NEGATIVE
SARS Coronavirus 2 by RT PCR: NEGATIVE

## 2020-06-07 LAB — CBC
HCT: 41.2 % (ref 39.0–52.0)
Hemoglobin: 13.5 g/dL (ref 13.0–17.0)
MCH: 33.8 pg (ref 26.0–34.0)
MCHC: 32.8 g/dL (ref 30.0–36.0)
MCV: 103.3 fL — ABNORMAL HIGH (ref 80.0–100.0)
Platelets: 284 10*3/uL (ref 150–400)
RBC: 3.99 MIL/uL — ABNORMAL LOW (ref 4.22–5.81)
RDW: 11.6 % (ref 11.5–15.5)
WBC: 9.5 10*3/uL (ref 4.0–10.5)
nRBC: 0 % (ref 0.0–0.2)

## 2020-06-07 LAB — MRSA PCR SCREENING: MRSA by PCR: NEGATIVE

## 2020-06-07 LAB — ETHANOL: Alcohol, Ethyl (B): <10 mg/dL (ref <10)

## 2020-06-07 LAB — APTT: aPTT: 29 seconds (ref 24–36)

## 2020-06-07 LAB — PROTIME-INR
INR: 1.1 (ref 0.8–1.2)
Prothrombin Time: 13.4 seconds (ref 11.4–15.2)

## 2020-06-07 MED ORDER — TAMSULOSIN HCL 0.4 MG PO CAPS
0.4000 mg | ORAL_CAPSULE | Freq: Once | ORAL | Status: AC
  Administered 2020-06-07: 0.4 mg via ORAL
  Filled 2020-06-07: qty 1

## 2020-06-07 MED ORDER — ONDANSETRON HCL 4 MG/2ML IJ SOLN
4.0000 mg | Freq: Once | INTRAMUSCULAR | Status: AC
  Administered 2020-06-07: 4 mg via INTRAVENOUS
  Filled 2020-06-07: qty 2

## 2020-06-07 MED ORDER — DEXAMETHASONE SODIUM PHOSPHATE 10 MG/ML IJ SOLN
10.0000 mg | Freq: Once | INTRAMUSCULAR | Status: AC
  Administered 2020-06-07: 10 mg via INTRAVENOUS
  Filled 2020-06-07: qty 1

## 2020-06-07 MED ORDER — DEXAMETHASONE SODIUM PHOSPHATE 4 MG/ML IJ SOLN
4.0000 mg | Freq: Four times a day (QID) | INTRAMUSCULAR | Status: DC
  Administered 2020-06-07: 4 mg via INTRAVENOUS
  Filled 2020-06-07: qty 1

## 2020-06-07 NOTE — ED Provider Notes (Signed)
[facility] EMERGENCY DEPARTMENT Provider Note   CSN: [id number] Arrival date & time: 06/07/20  1549  An emergency department physician performed an initial assessment on this suspected stroke patient at 38.  History Chief Complaint  Patient presents with  . Weakness    Adam Bass is a 70 y.o. adult with history of primary [place] lung cancer the left, alcohol use, COPD, CAD, hepatitis C, hepatocellular carcinoma, hypertension.  Patient presenting to the ED from home with weakness that began sometime yesterday.  Code stroke was initiated by ED physician prior to my evaluation.   History obtained after patient returned from [place].  Patient is unable to provide very detailed history.  He states he has been feeling unwell for some time.  He went to bed sometime around 10 PM last night.  He woke up sometime around 2 AM and felt weak.  He woke up again for the day around 5 to 5:30 AM and his weakness persisted.  He does not particularly express weakness on one side versus the other.  He states he was very unstable on his feet today.  He is not having any headache.  He states maybe he has some bilateral blurry vision though is unsure.  Denies diplopia or loss of vision.  Denies palpitations.  Denies numbness.  Additional history is obtained from the daughter over the phone.  She states a friend arrived to his house to provide him some food.  It is reported that he crawled to the door due to his profound weakness.  He was noted to have some slurred speech at the time.  She does not suggest a unilateral weakness though stated that EMS thought he may be more weak on the left.  She states he has been very fatigued though does wonder if his recent radiation therapy that ended early this month is contributory.  The history is provided by the patient (Daughter).   Is on anticoagulation.  Does take a baby aspirin daily.  Last dose was yesterday.    Past Medical History:  Diagnosis Date  .  Anticoagulant long-term use    brillinta  . Bilateral lower extremity edema    feet, noticed since discharge from hospital admission 05-18-2018, "i was laid up in the bed for five days"   . CAD (coronary artery disease) cardiologist-- dr j. branch   hx STEMI 11-23-2017,  s/p  cardiac cath with Thrombectomy, PCI and DES x1 to midRCA, and other nonobstrucitve CAD involving pLAD, OM1, dRCA, posterior atrio, LVEF 45-50%,  LVEDP 37mHg  . COPD (chronic obstructive pulmonary disease) with emphysema ([name]    pulmonology-- dr wert-- GOLD III mix (pt still smokes),  last exacerbation -due to the flu , [facility]  . DOE (dyspnea on exertion)   . Ectatic abdominal aorta ([place]    MRI 11/ 2019  . Hepatitis C GI-- dr rLaural Golden  dx 02-02-2018--- currently treated on antiviral medication  . Hepatocellular carcinoma ([place]   . History of radiation therapy 06/03/17- 06/09/17   Left Lung treated to 54 Gy with 3 fx of 18 Gy. SBRT  . History of skin cancer    04/ 2017 excision from face  . History of ST elevation myocardial infarction (STEMI) 11/23/2017   inferior wall-- s/p cardiac cath w/ thrombectomy, PCI, and DES  . HLD (hyperlipidemia)   . Hypertension   . Myocardial infarction ([place]   . Primary squamous cell carcinoma of left lung ([facility] oncologist-- dr  squire   dx 02/ 2018-- Stage IA2 Left lower lung,  s/p  Stereotactic radiation completed 06-09-2017  . Productive cough    per pt mostly clear  . Requires supplemental oxygen    06-09-2018 currently pt is using a portable O2 as needed and at night  . S/P drug eluting coronary stent placement 11/23/2017   DES x1 to midRCA  . Urinary incontinence    wear depends  . Wears dentures    fuller upper,  lower partial  . Wears glasses     Patient Active Problem List   Diagnosis Date Noted  . Intracranial bleeding ([place]) 06/07/2020  . 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 NSCLC of left lung ([place]) 03/15/2020  . S/P  bronchoscopy with biopsy   . Mediastinal adenopathy   . Lung mass 02/25/2020  .                                                                                       01/04/2020  . Chronic respiratory failure with hypoxia ([place]) 09/17/2019  . Essential hypertension 09/17/2019  . GERD (gastroesophageal reflux disease) 06/15/2019  . Hepatic cirrhosis due to chronic hepatitis C infection ([place]) 12/15/2018  . Hepatocellular carcinoma ([place]) 06/10/2018  . Influenza due to identified novel influenza A virus with other respiratory manifestations 05/17/2018  . Coronary artery disease, occlusive 05/16/2018  . COPD exacerbation ([place]) 05/13/2018  . Hepatitis C 05/13/2018  . Smoking 11/27/2017  . ETOH abuse 11/27/2017  . Heart attack ([place])   . Hyperlipidemia with target LDL less than 70   . LFT elevation   . Acute ST elevation myocardial infarction (STEMI) of inferior wall ([place]) 11/23/2017  . 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 Primary cancer of left lower lobe of lung ([place]) 04/30/2017  . COPD GOLD III/ still smoking cigars  07/21/2012    Past Surgical History:  Procedure Laterality Date  . BRONCHIAL BIOPSY  02/29/2020   Procedure: BRONCHIAL BIOPSIES;  Surgeon: Garner Nash, DO;  Location: [place] ENDOSCOPY;  Service: Pulmonary;;  . BRONCHIAL BRUSHINGS  02/29/2020   Procedure: BRONCHIAL BRUSHINGS;  Surgeon: Garner Nash, DO;  Location: [place] ENDOSCOPY;  Service: Pulmonary;;  . BRONCHIAL NEEDLE ASPIRATION BIOPSY  02/29/2020   Procedure: BRONCHIAL NEEDLE ASPIRATION BIOPSIES;  Surgeon: Garner Nash, DO;  Location: [place] ENDOSCOPY;  Service: Pulmonary;;  . BRONCHIAL WASHINGS  02/29/2020   Procedure: BRONCHIAL WASHINGS;  Surgeon: Garner Nash, DO;  Location: [place];  Service: Pulmonary;;  . COLONOSCOPY N/A 08/27/2012   Procedure: COLONOSCOPY;  Surgeon: Rogene Houston, MD;  Location: [facility];  Service: Endoscopy;   Laterality: N/A;  1200  . CORONARY STENT INTERVENTION N/A 11/23/2017   Procedure: CORONARY STENT INTERVENTION;  Surgeon: Jettie Booze, MD;  Location: [place] CV LAB;  Service: Cardiovascular;  Laterality: N/A;  . ENDOBRONCHIAL ULTRASOUND N/A 02/29/2020   Procedure: ENDOBRONCHIAL ULTRASOUND;  Surgeon: Garner Nash, DO;  Location: [place];  Service: Pulmonary;  Laterality: N/A;  . FINE NEEDLE ASPIRATION  02/29/2020   Procedure: FINE NEEDLE ASPIRATION;  Surgeon: Garner Nash, DO;  Location: [facility];  Service: Pulmonary;;  . IR RADIOLOGIST EVAL & MGMT  03/04/2018  . IR RADIOLOGIST EVAL & MGMT  10/06/2018  . IR RADIOLOGIST EVAL & MGMT  01/26/2019  . IR RADIOLOGIST EVAL & MGMT  08/11/2019  . IR RADIOLOGIST EVAL & MGMT  01/25/2020  . LEFT HEART CATH AND CORONARY ANGIOGRAPHY N/A 11/23/2017   Procedure: LEFT HEART CATH AND CORONARY ANGIOGRAPHY;  Surgeon: Jettie Booze, MD;  Location: [place] CV LAB;  Service: Cardiovascular;  Laterality: N/A;  . RADIOFREQUENCY ABLATION N/A 06/10/2018   Procedure: MICROWAVE THERMAL ABLATION LIVER;  Surgeon: Aletta Edouard, MD;  Location: [facility];  Service: Anesthesiology;  Laterality: N/A;  . VIDEO BRONCHOSCOPY WITH ENDOBRONCHIAL NAVIGATION Bilateral 02/29/2020   Procedure: VIDEO BRONCHOSCOPY WITH ENDOBRONCHIAL NAVIGATION;  Surgeon: Garner Nash, DO;  Location: [place];  Service: Pulmonary;  Laterality: Bilateral;     OB History   No obstetric history on file.     Family History  Problem Relation Age of Onset  . Breast cancer Sister   . Colon cancer Neg Hx     Social History   Tobacco Use  . Smoking status: Current Every Day Smoker    Packs/day: 0.75    Years: 50.00    Pack years: 37.50    Types: Cigars  . Smokeless tobacco: Never Used  . Tobacco comment: . He is smoking about 5-10 cigarettes daily.   Vaping Use  . Vaping Use: Former  . Quit date: 10/07/2017  Substance Use Topics  . Alcohol use: Yes     Alcohol/week: 8.0 standard drinks    Types: 8 Cans of beer per week    Comment: 8 beers weekly, give or take   . Drug use: No    Home Medications Prior to Admission medications   Medication Sig Start Date End Date Taking? Authorizing Provider  albuterol (PROAIR HFA) 108 (90 Base) MCG/ACT inhaler Inhale 2 puffs into the lungs every 6 (six) hours as needed for wheezing.    Yes [provider]  ALPRAZolam (XANAX) 0.5 MG tablet Take 0.25-0.5 mg by mouth 3 (three) times daily as needed for anxiety.   Yes [provider]  Ascorbic Acid (VITAMIN C PO) Take 1 tablet by mouth daily.   Yes [provider]  aspirin EC 81 MG tablet Take 81 mg by mouth daily.    Yes [provider]  atorvastatin (LIPITOR) 20 MG tablet TAKE 1 TABLET(20 MG) BY MOUTH DAILY Patient taking differently: Take 20 mg by mouth daily. 03/22/20  Yes BranchAlphonse Guild, MD  b complex vitamins capsule Take 1 capsule by mouth daily.   Yes [provider]  bisoprolol (ZEBETA) 5 MG tablet TAKE 1/2 TABLET BY MOUTH EVERY DAY Patient taking differently: Take 2.5 mg by mouth daily. 10/19/19  Yes BranchAlphonse Guild, MD  Cholecalciferol (VITAMIN D3 PO) Take 1 capsule by mouth daily.   Yes [provider]  citalopram (CELEXA) 20 MG tablet Take 20 mg by mouth daily. 06/07/20  Yes [provider]  esomeprazole (NEXIUM) 40 MG capsule Take 40 mg by mouth daily.    Yes [provider]  ezetimibe (ZETIA) 10 MG tablet Take 1 tablet (10 mg total) by mouth daily. 11/27/17 02/25/20 Yes Cheryln Manly, NP  Fluticasone-Umeclidin-Vilant (TRELEGY ELLIPTA) 100-62.5-25 MCG/INH AEPB INHALE 1 PUFF INTO THE LUNGS EVERY DAY Patient taking differently: Inhale 1 puff into the lungs daily. 08/28/19  Yes Wurst, [place], PA-C  hydrocortisone cream 1 % Apply  to affected area 2 times daily Patient taking differently: Apply 1 application topically 2 (two) times daily as needed for itching. 12/06/19  Yes  Avegno, Darrelyn Hillock, FNP  montelukast (SINGULAIR) 10 MG tablet One at bedtime every night Patient taking differently: Take 10 mg by mouth at bedtime. 10/15/19  Yes Tanda Rockers, MD  Multiple Vitamins-Minerals (MULTIVITAMIN WITH MINERALS) tablet Take 1 tablet by mouth daily.   Yes [provider]  nitroGLYCERIN (NITROSTAT) 0.4 MG SL tablet PLACE 1 TABLET UNDER THE TONGUE EVERY 5 MINUTES AS NEEDED. FOLLOW MD DIRECTIONS FOR WHEN TO CALL 911 Patient taking differently: Place 0.4 mg under the tongue every 5 (five) minutes as needed for chest pain. 01/10/20  Yes BranchAlphonse Guild, MD  OXYGEN Inhale into the lungs as needed. 4 Liters supplemental o2 , as needed "when im up moving around"   Yes [provider]  oxymetazoline (AFRIN) 0.05 % nasal spray Place 1 spray into both nostrils 2 (two) times daily as needed for congestion.    Yes [provider]  tamsulosin (FLOMAX) 0.4 MG CAPS capsule Take 1 capsule (0.4 mg total) by mouth daily. 05/11/20  Yes Truitt Merle, MD  Tetrahydrozoline HCl (VISINE OP) Place 1 drop into both eyes daily as needed (irritation).   Yes [provider]  benzonatate (TESSALON) 200 MG capsule TAKE 1 CAPSULE(200 MG) BY MOUTH THREE TIMES DAILY AS NEEDED FOR COUGH Patient not taking: No sig reported 05/09/20   Eppie Gibson, MD  famotidine-calcium carbonate-magnesium hydroxide (PEPCID COMPLETE) 10-800-165 MG chewable tablet Chew 1 tablet by mouth daily as needed (acid reflux). Patient not taking: No sig reported    [provider]  lidocaine (XYLOCAINE) 2 % solution Use as directed 10 mLs in the mouth or throat as needed for mouth pain. Patient not taking: No sig reported 04/26/20   Eppie Gibson, MD  Multiple Vitamins-Minerals (ZINC PO) Take 1 tablet by mouth daily. Patient not taking: No sig reported    [provider]  nicotine (NICODERM CQ - DOSED IN MG/24 HOURS) 21 mg/24hr patch Place 21 mg onto the skin daily. Patient not taking:  No sig reported    [provider]    Allergies    Bee venom  Review of Systems   Review of Systems  Neurological: Positive for speech difficulty and weakness. Negative for light-headedness, numbness and headaches.       Gait  All other systems reviewed and are negative.   Physical Exam Updated Vital Signs BP (!) 103/58   Pulse 80   Temp 97.7 F (36.5 C) (Oral)   Resp 17   Ht _0  (1.753 m)   Wt 59 kg   SpO2 98%   BMI 19.20 kg/m   Physical Exam Vitals and nursing note reviewed.  Constitutional:      Appearance: He is well-developed and well-nourished. He is ill-appearing.  HENT:     Head: Normocephalic and atraumatic.  Eyes:     Conjunctiva/sclera: Conjunctivae normal.  Cardiovascular:     Rate and Rhythm: Normal rate and regular rhythm.  Pulmonary:     Effort: Pulmonary effort is normal. No respiratory distress.     Comments: 4 L nasal cannula, chronic. Abdominal:     Palpations: Abdomen is soft.  Skin:    General: Skin is warm.  Neurological:     Mental Status: He is alert.     Comments: Mental Status:  Alert, oriented, thought content appropriate, able to give a coherent history. Speech  fluent without evidence of aphasia. Able to follow 2 step commands without difficulty.  Cranial Nerves:  II:  Peripheral visual fields grossly normal, pupils equal, round, reactive to light III,IV, VI: ptosis not present, extra-ocular motions intact bilaterally  V,VII: smile symmetric, facial light touch sensation equal VIII: hearing grossly normal to voice  X: uvula elevates symmetrically  XI: bilateral shoulder shrug symmetric and strong XII: midline tongue extension without fassiculations Motor:  Normal tone.  Strength appears to be mostly equal to bilateral upper and lower extremities with encouragement.  Negative pronator drift.   Sensory: grossly normal in all extremities.  Gait: Deferred CV: distal pulses palpable throughout    Psychiatric:        Mood  and Affect: Mood and affect normal.        Behavior: Behavior normal.     ED Results / Procedures / Treatments   Labs (all labs ordered are listed, but only abnormal results are displayed) Labs Reviewed  CBC - Abnormal; Notable for the following components:      Result Value   RBC 3.99 (*)    MCV 103.3 (*)    All other components within normal limits  DIFFERENTIAL - Abnormal; Notable for the following components:   Neutro Abs 8.5 (*)    Lymphs Abs 0.6 (*)    All other components within normal limits  COMPREHENSIVE METABOLIC PANEL - Abnormal; Notable for the following components:   Chloride 94 (*)    Glucose, Bld 160 (*)    Creatinine, Ser 0.48 (*)    All other components within normal limits  I-STAT CHEM 8, ED - Abnormal; Notable for the following components:   Chloride 94 (*)    Creatinine, Ser 0.50 (*)    Glucose, Bld 156 (*)    TCO2 33 (*)    All other components within normal limits  RESP PANEL BY RT-PCR (FLU A&B, COVID) ARPGX2  ETHANOL  PROTIME-INR  APTT  RAPID URINE DRUG SCREEN, HOSP PERFORMED  URINALYSIS, ROUTINE W REFLEX MICROSCOPIC    EKG EKG Interpretation  Date/Time:  Wednesday June 07 2020 16:16:09 EST Ventricular Rate:  78 PR Interval:    QRS Duration: 104 QT Interval:  437 QTC Calculation: 498 R Axis:   93 Text Interpretation: Sinus rhythm Biatrial enlargement Consider right ventricular hypertrophy Abnormal T, consider ischemia, lateral leads NSR, no acute changes from previous Confirmed by Lavenia Atlas [phone]) on 06/07/2020 5:41:33 PM   Radiology CT Head Wo Contrast  Result Date: 06/07/2020 CLINICAL DATA:  History of non-small cell lung cancer. Acute presentation with left-sided weakness. EXAM: CT HEAD WITHOUT CONTRAST TECHNIQUE: Contiguous axial images were obtained from the base of the skull through the vertex without intravenous contrast. COMPARISON:  MRI 04/30/2017 FINDINGS: Brain: 4 cm left cerebellar mass with necrosis and hemorrhage.  Mass effect with flattening of the fourth ventricle. Second 4 cm hemorrhagic necrotic mass at the right parietal vertex with moderate surrounding edema. Both of these are consistent with intracranial metastatic disease. Mild chronic small-vessel ischemic change seen elsewhere within the cerebral hemispheric white matter. No sign of ischemic infarction. No apparent hydrocephalus of the lateral or third ventricles. No extra-axial fluid collection. Vascular: There is atherosclerotic calcification of the major vessels at the base of the brain. Skull: Negative Sinuses/Orbits: Clear/normal Other: None IMPRESSION: 4 cm left cerebellar mass with necrosis and hemorrhage. 4 cm necrotic and hemorrhagic mass at the right parietal vertex with moderate surrounding edema. Findings consistent with intracranial metastatic disease. Some mass-effect upon the  fourth ventricle but no sign of obstructive hydrocephalus at this moment. Electronically Signed   By: Nelson Chimes M.D.   On: 06/07/2020 16:29    Procedures .Critical Care Performed by: , [place] N, PA-C Authorized by: , [place] N, PA-C   Critical care provider statement:    Critical care time (minutes):  35   Critical care time was exclusive of:  Separately billable procedures and treating other patients and teaching time   Critical care was necessary to treat or prevent imminent or life-threatening deterioration of the following conditions:  CNS failure or compromise   Critical care was time spent personally by me on the following activities:  Discussions with consultants, evaluation of patient's response to treatment, examination of patient, ordering and performing treatments and interventions, ordering and review of laboratory studies, ordering and review of radiographic studies, pulse oximetry, re-evaluation of patient's condition, obtaining history from patient or surrogate and review of old charts   I assumed direction of critical care for this  patient from another provider in my specialty: no       Medications Ordered in ED Medications  dexamethasone (DECADRON) injection 4 mg (has no administration in time range)  ondansetron (ZOFRAN) injection 4 mg (4 mg Intravenous Given 06/07/20 1633)  dexamethasone (DECADRON) injection 10 mg (10 mg Intravenous Given 06/07/20 1650)    ED Course  I have reviewed the triage vital signs and the nursing notes.  Pertinent labs & imaging results that were available during my care of the patient were reviewed by me and considered in my medical decision making (see chart for details).  Clinical Course as of 06/07/20 1933  Wed Jun 07, 2020  1644 Dr. Cheral Marker recommends 18m IV decadron and then in 6 hrs 457mIV and continued q6hrs [JR]  [id number] put in page to neurology instead of neurosurgery, however Dr. LiCheral Markerecommends 1062mV decadron now and then 4mg68mhrs. Recommends metastatic hemorrhage does not meet protocol for requiring teleneuro- teleneuro consult cancelled. Neurosurgery consult pending. Will also speak with oncology.  [JR]  [id number] with neurosurgeon Dr. CabbChristella Noagrees with IV Decadron.  [facility]  [id number] with oncologist, agrees with admission and Decadron. [JR]  [id number] SmitTamala Julianh critical care accepting admission. [JR]    Clinical Course User Index [JR] , JordMartiniquePA-C   MDM Rules/Calculators/A&P                          Patient presenting to the ED from home, code stroke was initiated upon arrival to the ED after evaluation by an ED physician.  On my evaluation, patient is not the best historian.  He is been having some weakness, it is not localized to one side versus the other.  His daughter reports some slurred speech and profound generalized weakness today.  Patient does endorse imbalance with walking.  He was noted to be crawling to the door to answer earlier today which prompted his ED visit.  He has  primary lung cancer in the left, last chemotherapy was finished on 05/16/2020.  He is followed by Dr. FengBurr MedicoPer review of medical record, last ridging of the brain was MRI in 2019 and showed no evidence of metastasis.  CT scan with findings consistent with 4 cm masses to right parietal and left cerebellar region that are consistent with metastatic disease with hemorrhage and necrosis.  He is findings were discussed  with neurosurgery, Dr. Christella Noa, neurology Dr. Cheral Marker, as well as oncologist Dr. Delton Coombes.  Recommendations for critical care admission to [facility], IV Decadron ordered.  Patient and her his daughter do not believe surgical intervention is desired.  Further discussion to be had with specialist upon arrival to [facility]  He remains hemodynamically stable at this time.  We will continue to monitor while awaiting transfer.   Final Clinical Impression(s) / ED Diagnoses Final diagnoses:  Intracranial bleeding [facility])  Mass of brain    Rx / DC Orders ED Discharge Orders    None       , [place] N, PA-C 06/07/20 1934    Lorelle Gibbs, DO 06/08/20 1544

## 2020-06-07 NOTE — Progress Notes (Signed)
[place] Progress Note Patient Name: Adam Bass DOB: 07-03-1950 MRN: [id number]   Date of Service  06/07/2020  HPI/Events of Note  Patient with known squamous cell lung cancer seen at [facility] ED for general weakness, CT brain showed a cerebellar mass with necrosis and some hemorrhage which is consistent with brain metastasis, patient has some surrounding edema and mass effect. [facility] was [facility] for further Neurology/ Neurosurgical  Evaluation and Rx.  eICU Interventions  New Patient Evaluation completed.        Frederik Pear 06/07/2020, 10:17 PM

## 2020-06-07 NOTE — ED Triage Notes (Signed)
Per [facility] pt started having weakness yesterday around 3pm, pt states today around 0530 am when he woke up he had left sided weakness, unsteady gait.

## 2020-06-07 NOTE — H&P (Addendum)
NAME:  Adam Bass, MRN:  [id number], DOB:  [birth year], LOS: 0 ADMISSION DATE:  06/07/2020, CONSULTATION DATE:  06/07/2020 REFERRING MD:  Martinique Robinson, PA, CHIEF COMPLAINT:  Weakness  Brief History:  70 year old male with metastatic NSCLC presenting with generalized weakness found to have a left cerebellar mass.  To be transferred to [facility] for further neurological and NSGY evaluation.  PCCM to admit.   History of Present Illness:  70 year old male with prior history of primary metastatic [place] lung cancer the left (dx 2018 s/p SRBT with recent radiation 12/14- 05/16/2020 due to poor functional status w/ recurrent large hypermetabolic left lower lobe lung mass confirmed by biopsy 02/29/2020), alcohol use, COPD on home O2 4-5L [place], CAD, hepatitis C s/p tx 2020, hepatocellular carcinoma (dx 05/2018), hypertension, and MAC infection presenting to [facility] on 2/23 with weakness that started yesterday.  Patient is a poor historian.  Has felt unwell for some time.  Went to bed last night around 2200 but woke up around 0200 today with generalized weakness and then again around 0500 with continued weakness (without particular localization to one side).  Has been unsteady on his feet today and possible blurred vision.  Denies headache, numbness, or loss of vision.  He does take a baby aspirin every day.  Patient does live alone and independently but of late has more sedentary but still able to perform his ADLs.    On arrival to [facility] ER, code stroke activated.  No focal deficits noted in ER and was alert and oriented.  CTH showed a 4cm left cerebellar mass with necrosis and hemorrhage with moderate surrounding edema consistent with metastatic disease; some mass effect on fourth ventricle but no signs of obstructive hydrocephalus at this time.  Normal coags.  Has been afebrile and hemodynamically stable and protecting his airway.  In ER, neurology, NSGY and oncology contacted by EDP who recommended transfer to [facility]  ICU and decadron IV. Neurosurgery to see in consultation on arrival.  PCCM asked to admit to ICU.   Past Medical History:  Primary [place] lung cancer (left), alcohol use, tobacco (cigar) abuse, COPD, CAD, hepatitis C, hepatocellular carcinoma, hypertension, MAC infection, GERD, HLD, NSTEMI/ CAD w/ stent 2019  Significant Hospital Events:  2/23 tx from [facility] to [facility]  Consults:  NSGY  Procedures:   Significant Diagnostic Tests:  2/23 [facility] >> 4 cm left cerebellar mass with necrosis and hemorrhage. 4 cm necrotic and hemorrhagic mass at the right parietal vertex with moderate surrounding edema. Findings consistent with intracranial metastatic disease. Some mass-effect upon the fourth ventricle but no sign of obstructive hydrocephalus at this moment.  Micro Data:  2/23 SARS/ flu >> neg  Antimicrobials:  n/a  Interim History / Subjective:  Arrived from [facility] in stable condition, only c/o anxiety  Objective   Blood pressure 117/64, pulse 78, temperature 97.7 F (36.5 C), temperature source Oral, resp. rate 20, height _0  (1.753 m), weight 59 kg, SpO2 98 %.       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 CT Brain demonstrated 4cm L cerebellar mass with necrosis and hemorrhage. Neuro, Neurosurgery, Oncology aware. - Neurology (Dr. Cheral Marker) and Neurosurgery consulted (Dr. Christella Noa), recommending CCM ICU  admission - Seen by Neurosurgery, suspect that the cerebellar mass should be resected pending oncology consult - Continue IV Decadron - Frequent neuro checks - Low threshold for intubation if patient becomes altered/unable to protect his airway - Consider PMT engagement to assist patient/family with ongoing [place] discussions  Squamous cell carcinoma of LLL History of squamous cell/NSCLC, s/p SBRT (L lung) 05/2017, IMRT (L lung) 03/2020 - 05/2020 (recently completed). Followed by Dr. Delton Coombes. - Due for repeat CT Chest for restaging (prior to presentation today) - Further oncologic treatment plan pending  COPD Baseline O2 requirement of 4-5L via [place]. Followed by Dr. Melvyn Novas as an outpatient. - Continue home inhalers (Trelegy, Albuterol PRN)  Hepatocellular carcinoma likely 2/2 Hepatitis C History of HCC s/p ablation with 16m LI-RADS 3 lesion in segment 8, several additional LI-RADS 3 lesions. - Continue intermittent surveillance monitoring  History of STEMI Hyperlipidemia  Best practice (evaluated daily)  Diet: regular Pain/Anxiety/Delirium protocol (if indicated): low dose Xanax VAP protocol (if indicated): n/a DVT prophylaxis: SCD's GI prophylaxis: n/a Glucose control: SSI Mobility: bed rest Disposition:ICU  Goals of Care:  Last date of multidisciplinary goals of care discussion: Family and staff present:  Summary of discussion:  Follow up goals of care discussion due: 3/4 Code Status: full code  Labs   CBC: Recent Labs  Lab 06/07/20 1617 06/07/20 1626  WBC 9.5  --   NEUTROABS 8.5*  --   HGB 13.5 14.6  HCT 41.2 43.0  MCV 103.3*  --   PLT 284  --     Basic Metabolic Panel: Recent Labs  Lab 06/07/20 1617 06/07/20 1626  NA 136 137  K 3.7 3.7  CL 94* 94*  CO2 30  --   GLUCOSE 160* 156*  BUN 17 19  CREATININE 0.48* 0.50*  CALCIUM 9.2  --    GFR: Estimated Creatinine Clearance (by C-G formula based on SCr of 0.5 mg/dL (L)) Male: 60.9 mL/min (A) Male:  71.7 mL/min (A) Recent Labs  Lab 06/07/20 1617  WBC 9.5    Liver Function Tests: Recent Labs  Lab 06/07/20 1617  AST 33  ALT 31  ALKPHOS 100  BILITOT 0.8  PROT 7.3  ALBUMIN 3.7   No results for input(s): LIPASE, AMYLASE in the last 168 hours. No results for input(s): AMMONIA in the last 168 hours.  ABG    Component Value Date/Time   PHART 7.444 05/06/2017 1407   PCO2ART 38.9 05/06/2017 1407   PO2ART 75.4 (L) 05/06/2017 1407   HCO3 26.7 05/06/2017 1407   TCO2 33 (H) 06/07/2020 1626   O2SAT 95.9 05/06/2017 1407     Coagulation Profile: Recent Labs  Lab 06/07/20 1617  INR 1.1    Cardiac Enzymes: No results for input(s): CKTOTAL, CKMB, CKMBINDEX, TROPONINI in the last 168 hours.  HbA1C: Hgb A1c MFr Bld  Date/Time Value Ref Range Status  11/24/2017 12:24 AM 5.0 4.8 - 5.6 % Final    Comment:    (NOTE)         Prediabetes: 5.7 - 6.4         Diabetes: >6.4  Glycemic control for adults with diabetes: <7.0     CBG: No results for input(s): GLUCAP in the last 168 hours.  Review of Systems:   Negative except as noted in HPI  Past Medical History:  He,  has a past medical history of Anticoagulant long-term use, Bilateral lower extremity edema, CAD (coronary artery disease) (cardiologist-- dr j. branch), COPD (chronic obstructive pulmonary disease) with emphysema ([place]), DOE (dyspnea on exertion), Ectatic abdominal aorta ([place]), Hepatitis C (GI-- dr Laural Golden), Hepatocellular carcinoma [facility]), History of radiation therapy (06/03/17- 06/09/17), History of skin cancer, History of ST elevation myocardial infarction (STEMI) (11/23/2017), HLD (hyperlipidemia), Hypertension, Myocardial infarction [facility]), Primary squamous cell carcinoma of left lung [facility]) (oncologist-- dr Isidore Moos), Productive cough, Requires supplemental oxygen, S/P drug eluting coronary stent placement (11/23/2017), Urinary incontinence, Wears dentures, and Wears glasses.   Surgical History:   Past Surgical  History:  Procedure Laterality Date  . BRONCHIAL BIOPSY  02/29/2020   Procedure: BRONCHIAL BIOPSIES;  Surgeon: Garner Nash, DO;  Location: [place] ENDOSCOPY;  Service: Pulmonary;;  . BRONCHIAL BRUSHINGS  02/29/2020   Procedure: BRONCHIAL BRUSHINGS;  Surgeon: Garner Nash, DO;  Location: [place] ENDOSCOPY;  Service: Pulmonary;;  . BRONCHIAL NEEDLE ASPIRATION BIOPSY  02/29/2020   Procedure: BRONCHIAL NEEDLE ASPIRATION BIOPSIES;  Surgeon: Garner Nash, DO;  Location: [place] ENDOSCOPY;  Service: Pulmonary;;  . BRONCHIAL WASHINGS  02/29/2020   Procedure: BRONCHIAL WASHINGS;  Surgeon: Garner Nash, DO;  Location: [place];  Service: Pulmonary;;  . COLONOSCOPY N/A 08/27/2012   Procedure: COLONOSCOPY;  Surgeon: Rogene Houston, MD;  [facility];  Service: Endoscopy;  Laterality: N/A;  1200  . CORONARY STENT INTERVENTION N/A 11/23/2017   Procedure: CORONARY STENT INTERVENTION;  Surgeon: Jettie Booze, MD;  Location: [place] CV LAB;  Service: Cardiovascular;  Laterality: N/A;  . ENDOBRONCHIAL ULTRASOUND N/A 02/29/2020   Procedure: ENDOBRONCHIAL ULTRASOUND;  Surgeon: Garner Nash, DO;  Location: [place];  Service: Pulmonary;  Laterality: N/A;  . FINE NEEDLE ASPIRATION  02/29/2020   Procedure: FINE NEEDLE ASPIRATION;  Surgeon: Garner Nash, DO;  Location: [place] ENDOSCOPY;  Service: Pulmonary;;  . IR RADIOLOGIST EVAL & MGMT  03/04/2018  . IR RADIOLOGIST EVAL & MGMT  10/06/2018  . IR RADIOLOGIST EVAL & MGMT  01/26/2019  . IR RADIOLOGIST EVAL & MGMT  08/11/2019  . IR RADIOLOGIST EVAL & MGMT  01/25/2020  . LEFT HEART CATH AND CORONARY ANGIOGRAPHY N/A 11/23/2017   Procedure: LEFT HEART CATH AND CORONARY ANGIOGRAPHY;  Surgeon: Jettie Booze, MD;  Location: [place] CV LAB;  Service: Cardiovascular;  Laterality: N/A;  . RADIOFREQUENCY ABLATION N/A 06/10/2018   Procedure: MICROWAVE THERMAL ABLATION LIVER;  Surgeon: Aletta Edouard, MD;  Location: [facility];  Service:  Anesthesiology;  Laterality: N/A;  . VIDEO BRONCHOSCOPY WITH ENDOBRONCHIAL NAVIGATION Bilateral 02/29/2020   Procedure: VIDEO BRONCHOSCOPY WITH ENDOBRONCHIAL NAVIGATION;  Surgeon: Garner Nash, DO;  Location: [place];  Service: Pulmonary;  Laterality: Bilateral;     Social History:   reports that he has been smoking cigars. He has a 37.50 pack-year smoking history. He has never used smokeless tobacco. He reports current alcohol use of about 8.0 standard drinks of alcohol per week. He reports that he does not use drugs.   Family History:  His family history includes Breast cancer in his sister. There is no history of Colon cancer.   Allergies Allergies  Allergen Reactions  . Bee Venom Hives     Home Medications  Prior to Admission medications   Medication Sig Start Date End Date Taking? Authorizing Provider  albuterol (PROAIR HFA) 108 (90 Base) MCG/ACT inhaler Inhale 2 puffs into the lungs every 6 (six) hours as needed for wheezing.    Yes [provider]  ALPRAZolam (XANAX) 0.5 MG tablet Take 0.25-0.5 mg by mouth 3 (three) times daily as needed for anxiety.   Yes [provider]  Ascorbic Acid (VITAMIN C PO) Take 1 tablet by mouth daily.   Yes [provider]  aspirin EC 81 MG tablet Take 81 mg by mouth daily.    Yes [provider]  atorvastatin (LIPITOR) 20 MG tablet TAKE 1 TABLET(20 MG) BY MOUTH DAILY Patient taking differently: Take 20 mg by mouth daily. 03/22/20  Yes BranchAlphonse Guild, MD  b complex vitamins capsule Take 1 capsule by mouth daily.   Yes [provider]  bisoprolol (ZEBETA) 5 MG tablet TAKE 1/2 TABLET BY MOUTH EVERY DAY Patient taking differently: Take 2.5 mg by mouth daily. 10/19/19  Yes BranchAlphonse Guild, MD  Cholecalciferol (VITAMIN D3 PO) Take 1 capsule by mouth daily.   Yes [provider]  citalopram (CELEXA) 20 MG tablet Take 20 mg by mouth daily. 06/07/20  Yes [provider]  esomeprazole  (NEXIUM) 40 MG capsule Take 40 mg by mouth daily.    Yes [provider]  ezetimibe (ZETIA) 10 MG tablet Take 1 tablet (10 mg total) by mouth daily. 11/27/17 02/25/20 Yes Cheryln Manly, NP  Fluticasone-Umeclidin-Vilant (TRELEGY ELLIPTA) 100-62.5-25 MCG/INH AEPB INHALE 1 PUFF INTO THE LUNGS EVERY DAY Patient taking differently: Inhale 1 puff into the lungs daily. 08/28/19  Yes Wurst, [place], PA-C  hydrocortisone cream 1 % Apply to affected area 2 times daily Patient taking differently: Apply 1 application topically 2 (two) times daily as needed for itching. 12/06/19  Yes Avegno, Darrelyn Hillock, FNP  montelukast (SINGULAIR) 10 MG tablet One at bedtime every night Patient taking differently: Take 10 mg by mouth at bedtime. 10/15/19  Yes Tanda Rockers, MD  Multiple Vitamins-Minerals (MULTIVITAMIN WITH MINERALS) tablet Take 1 tablet by mouth daily.   Yes [provider]  nitroGLYCERIN (NITROSTAT) 0.4 MG SL tablet PLACE 1 TABLET UNDER THE TONGUE EVERY 5 MINUTES AS NEEDED. FOLLOW MD DIRECTIONS FOR WHEN TO CALL 911 Patient taking differently: Place 0.4 mg under the tongue every 5 (five) minutes as needed for chest pain. 01/10/20  Yes BranchAlphonse Guild, MD  OXYGEN Inhale into the lungs as needed. 4 Liters supplemental o2 , as needed "when im up moving around"   Yes [provider]  oxymetazoline (AFRIN) 0.05 % nasal spray Place 1 spray into both nostrils 2 (two) times daily as needed for congestion.    Yes [provider]  tamsulosin (FLOMAX) 0.4 MG CAPS capsule Take 1 capsule (0.4 mg total) by mouth daily. 05/11/20  Yes Truitt Merle, MD  Tetrahydrozoline HCl (VISINE OP) Place 1 drop into both eyes daily as needed (irritation).   Yes [provider]  benzonatate (TESSALON) 200 MG capsule TAKE 1 CAPSULE(200 MG) BY MOUTH THREE TIMES DAILY AS NEEDED FOR COUGH Patient not taking: No sig reported 05/09/20   Eppie Gibson, MD  famotidine-calcium carbonate-magnesium hydroxide  (PEPCID COMPLETE) [phone] MG chewable tablet Chew 1 tablet by mouth daily as needed (acid reflux). Patient not taking: No sig reported    [provider]  lidocaine (XYLOCAINE) 2 % solution Use as directed 10 mLs in the mouth or throat as  needed for mouth pain. Patient not taking: No sig reported 04/26/20   Eppie Gibson, MD  Multiple Vitamins-Minerals (ZINC PO) Take 1 tablet by mouth daily. Patient not taking: No sig reported    [provider]  nicotine (NICODERM CQ - DOSED IN MG/24 HOURS) 21 mg/24hr patch Place 21 mg onto the skin daily. Patient not taking: No sig reported    [provider]     Critical care time: 45 minutes     CRITICAL CARE Performed by: Otilio Carpen Hilery Wintle   Total critical care time: 45 minutes  Critical care time was exclusive of separately billable procedures and treating other patients.  Critical care was necessary to treat or prevent imminent or life-threatening deterioration.  Critical care was time spent personally by me on the following activities: development of treatment plan with patient and/or surrogate as well as nursing, discussions with consultants, evaluation of patient's response to treatment, examination of patient, obtaining history from patient or surrogate, ordering and performing treatments and interventions, ordering and review of laboratory studies, ordering and review of radiographic studies, pulse oximetry and re-evaluation of patient's condition.   Otilio Carpen Rohan Juenger, PA-C [facility] See Amion for pager If no response to pager , please call [phone] until 7pm After 7:00 pm call Elink  [phone]?[place]

## 2020-06-07 NOTE — Progress Notes (Signed)
Left cerebellar mass with necrosis and hemorrhage with left-sided focal deficits Presented to [facility] 2/23AM via EMS for left-sided weakness and unsteady gait. CT Brain demonstrated 4cm L cerebellar mass with necrosis and hemorrhage. Neuro, Neurosurgery, Oncology aware. - Neurology (Dr. Cheral Marker) and Neurosurgery consulted (Dr. Christella Noa), recommending CCM ICU admission - F/u Neurosurgery recs; uncertain if patient is amenable to surgical interventions, if any - Continue IV Decadron - Frequent neuro checks - Low threshold for intubation if patient becomes altered/unable to protect his airway - Consider PMT engagement to assist patient/family with ongoing [place] discussions  Squamous cell carcinoma of LLL History of squamous cell/NSCLC, s/p SBRT (L lung) 05/2017, IMRT (L lung) 03/2020 - 05/2020 (recently completed). Followed by Dr. Delton Coombes. - Due for repeat CT Chest for restaging (prior to presentation today) - Further oncologic treatment plan pending  COPD Baseline O2 requirement of 4-5L via [place]. Followed by Dr. Melvyn Novas as an outpatient. - Continue home inhalers (Trelegy, Albuterol PRN)  Hepatocellular carcinoma likely 2/2 Hepatitis C History of HCC s/p ablation with 58mm LI-RADS 3 lesion in segment 8, several additional LI-RADS 3 lesions. - Continue intermittent surveillance monitoring  History of STEMI s/p DES x 1 (mid RCA) Hyperlipidemia - Hold home ASA, Zetia, Lipitor, bisoprolol for now

## 2020-06-08 DIAGNOSIS — R531 Weakness: Secondary | ICD-10-CM

## 2020-06-08 DIAGNOSIS — C799 Secondary malignant neoplasm of unspecified site: Secondary | ICD-10-CM | POA: Diagnosis present

## 2020-06-08 DIAGNOSIS — G9389 Other specified disorders of brain: Secondary | ICD-10-CM

## 2020-06-08 LAB — CBC
HCT: 39.1 % (ref 39.0–52.0)
Hemoglobin: 13.3 g/dL (ref 13.0–17.0)
MCH: 34.1 pg — ABNORMAL HIGH (ref 26.0–34.0)
MCHC: 34 g/dL (ref 30.0–36.0)
MCV: 100.3 fL — ABNORMAL HIGH (ref 80.0–100.0)
Platelets: 251 10*3/uL (ref 150–400)
RBC: 3.9 MIL/uL — ABNORMAL LOW (ref 4.22–5.81)
RDW: 11.5 % (ref 11.5–15.5)
WBC: 8 10*3/uL (ref 4.0–10.5)
nRBC: 0 % (ref 0.0–0.2)

## 2020-06-08 LAB — URINALYSIS, ROUTINE W REFLEX MICROSCOPIC
Bilirubin Urine: NEGATIVE
Glucose, UA: NEGATIVE mg/dL
Hgb urine dipstick: NEGATIVE
Ketones, ur: 20 mg/dL — AB
Leukocytes,Ua: NEGATIVE
Nitrite: NEGATIVE
Protein, ur: NEGATIVE mg/dL
Specific Gravity, Urine: 1.019 (ref 1.005–1.030)
pH: 6 (ref 5.0–8.0)

## 2020-06-08 LAB — CREATININE, SERUM
Creatinine, Ser: 0.54 mg/dL — ABNORMAL LOW (ref 0.61–1.24)
GFR, Estimated: >60 mL/min (ref >60)

## 2020-06-08 LAB — RAPID URINE DRUG SCREEN, HOSP PERFORMED
Amphetamines: NOT DETECTED
Barbiturates: NOT DETECTED
Benzodiazepines: POSITIVE — AB
Cocaine: NOT DETECTED
Opiates: NOT DETECTED
Tetrahydrocannabinol: NOT DETECTED

## 2020-06-08 LAB — HIV ANTIBODY (ROUTINE TESTING W REFLEX): HIV Screen 4th Generation wRfx: NONREACTIVE

## 2020-06-08 MED ORDER — ONDANSETRON HCL 4 MG/2ML IJ SOLN
4.0000 mg | Freq: Four times a day (QID) | INTRAMUSCULAR | Status: DC | PRN
  Administered 2020-06-08 – 2020-06-10 (×3): 4 mg via INTRAVENOUS
  Filled 2020-06-08 (×3): qty 2

## 2020-06-08 MED ORDER — CHLORHEXIDINE GLUCONATE CLOTH 2 % EX PADS
6.0000 | MEDICATED_PAD | Freq: Every day | CUTANEOUS | Status: DC
  Administered 2020-06-08 – 2020-06-11 (×4): 6 via TOPICAL

## 2020-06-08 MED ORDER — ZOLPIDEM TARTRATE 5 MG PO TABS
5.0000 mg | ORAL_TABLET | Freq: Every evening | ORAL | Status: DC | PRN
  Administered 2020-06-08: 5 mg via ORAL
  Filled 2020-06-08: qty 1

## 2020-06-08 MED ORDER — MORPHINE SULFATE (PF) 2 MG/ML IV SOLN
2.0000 mg | INTRAVENOUS | Status: DC | PRN
  Administered 2020-06-10: 2 mg via INTRAVENOUS
  Filled 2020-06-08: qty 1

## 2020-06-08 MED ORDER — DEXAMETHASONE 4 MG PO TABS
6.0000 mg | ORAL_TABLET | Freq: Four times a day (QID) | ORAL | Status: DC
  Administered 2020-06-08 – 2020-06-09 (×6): 6 mg via ORAL
  Filled 2020-06-08 (×6): qty 2

## 2020-06-08 MED ORDER — TAMSULOSIN HCL 0.4 MG PO CAPS
0.4000 mg | ORAL_CAPSULE | Freq: Every day | ORAL | Status: DC
  Administered 2020-06-08 – 2020-06-11 (×4): 0.4 mg via ORAL
  Filled 2020-06-08 (×4): qty 1

## 2020-06-08 MED ORDER — DOCUSATE SODIUM 100 MG PO CAPS
100.0000 mg | ORAL_CAPSULE | Freq: Two times a day (BID) | ORAL | Status: DC
  Administered 2020-06-08 – 2020-06-11 (×7): 100 mg via ORAL
  Filled 2020-06-08 (×7): qty 1

## 2020-06-08 MED ORDER — OXYCODONE HCL 5 MG PO TABS
5.0000 mg | ORAL_TABLET | ORAL | Status: DC | PRN
  Administered 2020-06-08: 5 mg via ORAL
  Filled 2020-06-08: qty 1

## 2020-06-08 MED ORDER — HEPARIN SODIUM (PORCINE) 5000 UNIT/ML IJ SOLN
5000.0000 [IU] | Freq: Three times a day (TID) | INTRAMUSCULAR | Status: DC
  Administered 2020-06-08 – 2020-06-11 (×11): 5000 [IU] via SUBCUTANEOUS
  Filled 2020-06-08 (×11): qty 1

## 2020-06-08 MED ORDER — ALBUTEROL SULFATE HFA 108 (90 BASE) MCG/ACT IN AERS
2.0000 | INHALATION_SPRAY | Freq: Four times a day (QID) | RESPIRATORY_TRACT | Status: DC | PRN
  Administered 2020-06-08 (×2): 2 via RESPIRATORY_TRACT
  Filled 2020-06-08: qty 6.7

## 2020-06-08 MED ORDER — NICOTINE 14 MG/24HR TD PT24
14.0000 mg | MEDICATED_PATCH | Freq: Every day | TRANSDERMAL | Status: DC
  Administered 2020-06-08 – 2020-06-11 (×4): 14 mg via TRANSDERMAL
  Filled 2020-06-08 (×4): qty 1

## 2020-06-08 MED ORDER — ONDANSETRON HCL 4 MG PO TABS: 4.0000 mg | ORAL_TABLET | Freq: Four times a day (QID) | ORAL | Status: DC | PRN

## 2020-06-08 MED ORDER — SENNOSIDES-DOCUSATE SODIUM 8.6-50 MG PO TABS: 1.0000 | ORAL_TABLET | Freq: Every evening | ORAL | Status: DC | PRN

## 2020-06-08 MED ORDER — ALPRAZOLAM 0.5 MG PO TABS: 0.5000 mg | ORAL_TABLET | Freq: Every evening | ORAL | Status: DC | PRN

## 2020-06-08 MED ORDER — MAGNESIUM CITRATE PO SOLN: 1.0000 | Freq: Once | ORAL | Status: DC | PRN

## 2020-06-08 MED ORDER — POTASSIUM CHLORIDE IN NACL 20-0.9 MEQ/L-% IV SOLN
INTRAVENOUS | Status: DC
  Filled 2020-06-08 (×5): qty 1000

## 2020-06-08 MED ORDER — MOMETASONE FURO-FORMOTEROL FUM 200-5 MCG/ACT IN AERO
2.0000 | INHALATION_SPRAY | Freq: Two times a day (BID) | RESPIRATORY_TRACT | Status: DC
  Administered 2020-06-08 – 2020-06-11 (×7): 2 via RESPIRATORY_TRACT
  Filled 2020-06-08: qty 8.8

## 2020-06-08 MED ORDER — BISACODYL 5 MG PO TBEC: 5.0000 mg | DELAYED_RELEASE_TABLET | Freq: Every day | ORAL | Status: DC | PRN

## 2020-06-08 MED ORDER — ACETAMINOPHEN 325 MG PO TABS: 650.0000 mg | ORAL_TABLET | Freq: Four times a day (QID) | ORAL | Status: DC | PRN

## 2020-06-08 MED ORDER — ACETAMINOPHEN 650 MG RE SUPP: 650.0000 mg | Freq: Four times a day (QID) | RECTAL | Status: DC | PRN

## 2020-06-08 MED ORDER — ALPRAZOLAM 0.5 MG PO TABS
0.5000 mg | ORAL_TABLET | Freq: Four times a day (QID) | ORAL | Status: DC | PRN
  Administered 2020-06-08 – 2020-06-10 (×4): 0.5 mg via ORAL
  Filled 2020-06-08 (×4): qty 1

## 2020-06-08 MED ORDER — ALPRAZOLAM 0.5 MG PO TABS
0.2500 mg | ORAL_TABLET | Freq: Two times a day (BID) | ORAL | Status: DC | PRN
  Administered 2020-06-08: 0.25 mg via ORAL
  Filled 2020-06-08: qty 1

## 2020-06-08 NOTE — Progress Notes (Signed)
Patient ID: Adam Bass, adult   DOB: 06-09-1950, 70 y.o.   MRN: [id number] BP 128/89   Pulse 83   Temp 99.5 F (37.5 C) (Oral)   Resp (!) 21   Ht 5\' 9"  (1.753 m)   Wt 59 kg   SpO2 96%   BMI 19.20 kg/m  Alert and oriented x 4. Difficulty breathing Moving all extremities, weak on his left side I have spoken with him and his daughter. They do not wish to pursue operative treatment for the metastases. I agree. I do not believe given his copd that he could get off the ventilator. They have given this much thought, and very much seem at peace.  He will need home hospice, and other ancillary services.

## 2020-06-08 NOTE — Consult Note (Signed)
Reason for Consult:right parietal tumor, left cerebellar tumor Referring Physician: ED  Adam Bass is an 70 y.o. adult.  HPI: dx with non small cell lung CA approximately three years ago. He has recently finished radiation for the lung CA. Had problems yesterday walking, and eventually was unable to walk. He crawled to the door to let his friend inside. Was felt to be a code stroke upon arrival to [facility]. Head CT revealed a right parietal mass with acute blood, and a left cerebellar hemispheric mass with acute blood. Felt to be metastatic lesions he is sent to [facility] for further care.   Past Medical History:  Diagnosis Date  . Anticoagulant long-term use    brillinta  . Bilateral lower extremity edema    feet, noticed since discharge from hospital admission 05-18-2018, "i was laid up in the bed for five days"   . CAD (coronary artery disease) cardiologist-- dr j. branch   hx STEMI 11-23-2017,  s/p  cardiac cath with Thrombectomy, PCI and DES x1 to midRCA, and other nonobstrucitve CAD involving pLAD, OM1, dRCA, posterior atrio, LVEF 45-50%,  LVEDP 72mmHg  . COPD (chronic obstructive pulmonary disease) with emphysema [facility])    pulmonology-- dr wert-- GOLD III mix (pt still smokes),  last exacerbation -due to the flu , discharged 05-18-2018 at [facility]   . DOE (dyspnea on exertion)   . Ectatic abdominal aorta ([place])    MRI 11/ 2019  . Hepatitis C GI-- dr Laural Golden   dx 02-02-2018--- currently treated on antiviral medication  . Hepatocellular carcinoma ([place])   . History of radiation therapy 06/03/17- 06/09/17   Left Lung treated to 54 Gy with 3 fx of 18 Gy. SBRT  . History of skin cancer    04/ 2017 excision from face  . History of ST elevation myocardial infarction (STEMI) 11/23/2017   inferior wall-- s/p cardiac cath w/ thrombectomy, PCI, and DES  . HLD (hyperlipidemia)   . Hypertension   . Myocardial infarction ([place])   . Primary squamous cell carcinoma of left lung [employer])  oncologist-- dr Isidore Moos   dx 02/ 2018-- Stage IA2 Left lower lung,  s/p  Stereotactic radiation completed 06-09-2017  . Productive cough    per pt mostly clear  . Requires supplemental oxygen    06-09-2018 currently pt is using a portable O2 as needed and at night  . S/P drug eluting coronary stent placement 11/23/2017   DES x1 to midRCA  . Urinary incontinence    wear depends  . Wears dentures    fuller upper,  lower partial  . Wears glasses     Past Surgical History:  Procedure Laterality Date  . BRONCHIAL BIOPSY  02/29/2020   Procedure: BRONCHIAL BIOPSIES;  Surgeon: Garner Nash, DO;  Location: [place] ENDOSCOPY;  Service: Pulmonary;;  . BRONCHIAL BRUSHINGS  02/29/2020   Procedure: BRONCHIAL BRUSHINGS;  Surgeon: Garner Nash, DO;  Location: [place] ENDOSCOPY;  Service: Pulmonary;;  . BRONCHIAL NEEDLE ASPIRATION BIOPSY  02/29/2020   Procedure: BRONCHIAL NEEDLE ASPIRATION BIOPSIES;  Surgeon: Garner Nash, DO;  Location: [place] ENDOSCOPY;  Service: Pulmonary;;  . BRONCHIAL WASHINGS  02/29/2020   Procedure: BRONCHIAL WASHINGS;  Surgeon: Garner Nash, DO;  Location: [place];  Service: Pulmonary;;  . COLONOSCOPY N/A 08/27/2012   Procedure: COLONOSCOPY;  Surgeon: Rogene Houston, MD;  [facility];  Service: Endoscopy;  Laterality: N/A;  1200  . CORONARY STENT INTERVENTION N/A 11/23/2017   Procedure: CORONARY STENT  INTERVENTION;  Surgeon: Jettie Booze, MD;  Location: [place] CV LAB;  Service: Cardiovascular;  Laterality: N/A;  . ENDOBRONCHIAL ULTRASOUND N/A 02/29/2020   Procedure: ENDOBRONCHIAL ULTRASOUND;  Surgeon: Garner Nash, DO;  Location: [place];  Service: Pulmonary;  Laterality: N/A;  . FINE NEEDLE ASPIRATION  02/29/2020   Procedure: FINE NEEDLE ASPIRATION;  Surgeon: Garner Nash, DO;  Location: [place] ENDOSCOPY;  Service: Pulmonary;;  . IR RADIOLOGIST EVAL & MGMT  03/04/2018  . IR RADIOLOGIST EVAL & MGMT  10/06/2018  . IR RADIOLOGIST EVAL &  MGMT  01/26/2019  . IR RADIOLOGIST EVAL & MGMT  08/11/2019  . IR RADIOLOGIST EVAL & MGMT  01/25/2020  . LEFT HEART CATH AND CORONARY ANGIOGRAPHY N/A 11/23/2017   Procedure: LEFT HEART CATH AND CORONARY ANGIOGRAPHY;  Surgeon: Jettie Booze, MD;  Location: [place] CV LAB;  Service: Cardiovascular;  Laterality: N/A;  . RADIOFREQUENCY ABLATION N/A 06/10/2018   Procedure: MICROWAVE THERMAL ABLATION LIVER;  Surgeon: Aletta Edouard, MD;  Location: [facility];  Service: Anesthesiology;  Laterality: N/A;  . VIDEO BRONCHOSCOPY WITH ENDOBRONCHIAL NAVIGATION Bilateral 02/29/2020   Procedure: VIDEO BRONCHOSCOPY WITH ENDOBRONCHIAL NAVIGATION;  Surgeon: Garner Nash, DO;  Location: [place];  Service: Pulmonary;  Laterality: Bilateral;    Family History  Problem Relation Age of Onset  . Breast cancer Sister   . Colon cancer Neg Hx     Social History:  reports that he has been smoking cigars. He has a 37.50 pack-year smoking history. He has never used smokeless tobacco. He reports current alcohol use of about 8.0 standard drinks of alcohol per week. He reports that he does not use drugs.  Allergies:  Allergies  Allergen Reactions  . Bee Venom Hives    Medications: I have reviewed the patient's current medications.  Results for orders placed or performed during the hospital encounter of 06/07/20 (from the past 48 hour(s))  Ethanol     Status: None   Collection Time: 06/07/20  4:17 PM  Result Value Ref Range   Alcohol, Ethyl (B) <10 <10 mg/dL    Comment: (NOTE) Lowest detectable limit for serum alcohol is 10 mg/dL.  For medical purposes only. Performed at [facility], [address], [place], [place] [address]   Protime-INR     Status: None   Collection Time: 06/07/20  4:17 PM  Result Value Ref Range   Prothrombin Time 13.4 11.4 - 15.2 seconds   INR 1.1 0.8 - 1.2    Comment: (NOTE) INR goal varies based on device and disease states. Performed at [facility], [address] [place], [place] [address]   APTT     Status: None   Collection Time: 06/07/20  4:17 PM  Result Value Ref Range   aPTT 29 24 - 36 seconds    Comment: Performed at [facility], [address], [place], [place] [address]  CBC     Status: Abnormal   Collection Time: 06/07/20  4:17 PM  Result Value Ref Range   WBC 9.5 4.0 - 10.5 K/uL   RBC 3.99 (L) 4.22 - 5.81 MIL/uL   Hemoglobin 13.5 13.0 - 17.0 g/dL   HCT 41.2 39.0 - 52.0 %   MCV 103.3 (H) 80.0 - 100.0 fL   MCH 33.8 26.0 - 34.0 pg   MCHC 32.8 30.0 - 36.0 g/dL   RDW 11.6 11.5 - 15.5 %   Platelets 284 150 - 400 K/uL   nRBC 0.0 0.0 - 0.2 %  Comment: Performed at [facility], [address], [place], [place] [address]  Differential     Status: Abnormal   Collection Time: 06/07/20  4:17 PM  Result Value Ref Range   Neutrophils Relative % 91 %   Neutro Abs 8.5 (H) 1.7 - 7.7 K/uL   Lymphocytes Relative 6 %   Lymphs Abs 0.6 (L) 0.7 - 4.0 K/uL   Monocytes Relative 3 %   Monocytes Absolute 0.3 0.1 - 1.0 K/uL   Eosinophils Relative 0 %   Eosinophils Absolute 0.0 0.0 - 0.5 K/uL   Basophils Relative 0 %   Basophils Absolute 0.0 0.0 - 0.1 K/uL   Immature Granulocytes 0 %   Abs Immature Granulocytes 0.02 0.00 - 0.07 K/uL    Comment: Performed at [facility], [address], [place], [place] [address]  Comprehensive metabolic panel     Status: Abnormal   Collection Time: 06/07/20  4:17 PM  Result Value Ref Range   Sodium 136 135 - 145 mmol/L   Potassium 3.7 3.5 - 5.1 mmol/L   Chloride 94 (L) 98 - 111 mmol/L   CO2 30 22 - 32 mmol/L   Glucose, Bld 160 (H) 70 - 99 mg/dL    Comment: Glucose reference range applies only to samples taken after fasting for at least 8 hours.   BUN 17 8 - 23 mg/dL   Creatinine, Ser 0.48 (L) 0.61 - 1.24 mg/dL   Calcium 9.2 8.9 - 10.3 mg/dL   Total Protein 7.3 6.5 - 8.1 g/dL   Albumin 3.7 3.5 - 5.0 g/dL   AST 33 15 - 41 U/L   ALT 31 0 - 44 U/L   Alkaline Phosphatase 100 38 - 126 U/L   Total Bilirubin 0.8  0.3 - 1.2 mg/dL   GFR, Estimated >60 >60 mL/min    Comment: (NOTE) Calculated using the CKD-EPI Creatinine Equation (2021)    Anion gap 12 5 - 15    Comment: Performed at [facility], [address], [place], [place] [address]  Resp Panel by RT-PCR (Flu A&B, Covid) Nasopharyngeal Swab     Status: None   Collection Time: 06/07/20  4:18 PM   Specimen: Nasopharyngeal Swab; Nasopharyngeal(NP) swabs in vial transport medium  Result Value Ref Range   SARS Coronavirus 2 by RT PCR NEGATIVE NEGATIVE    Comment: (NOTE) SARS-CoV-2 target nucleic acids are NOT DETECTED.  The SARS-CoV-2 RNA is generally detectable in upper respiratory specimens during the acute phase of infection. The lowest concentration of SARS-CoV-2 viral copies this assay can detect is 138 copies/mL. A negative result does not preclude SARS-Cov-2 infection and should not be used as the sole basis for treatment or other patient management decisions. A negative result may occur with  improper specimen collection/handling, submission of specimen other than nasopharyngeal swab, presence of viral mutation(s) within the areas targeted by this assay, and inadequate number of viral copies(<138 copies/mL). A negative result must be combined with clinical observations, patient history, and epidemiological information. The expected result is Negative.  Fact Sheet for Patients:  EntrepreneurPulse.com.au  Fact Sheet for Healthcare Providers:  IncredibleEmployment.be  This test is no t yet approved or cleared by the [place] FDA and  has been authorized for detection and/or diagnosis of SARS-CoV-2 by FDA under an Emergency Use Authorization (EUA). This EUA will remain  in effect (meaning this test can be used) for the duration of the COVID-19 declaration under Section 564(b)(1) of the Act, 21 U.S.C.section 360bbb-3(b)(1), unless the authorization  is terminated  or revoked sooner.        Influenza A by PCR NEGATIVE NEGATIVE   Influenza B by PCR NEGATIVE NEGATIVE    Comment: (NOTE) The Xpert Xpress SARS-CoV-2/FLU/RSV plus assay is intended as an aid in the diagnosis of influenza from Nasopharyngeal swab specimens and should not be used as a sole basis for treatment. Nasal washings and aspirates are unacceptable for Xpert Xpress SARS-CoV-2/FLU/RSV testing.  Fact Sheet for Patients: EntrepreneurPulse.com.au  Fact Sheet for Healthcare Providers: IncredibleEmployment.be  This test is not yet approved or cleared by the [place] FDA and has been authorized for detection and/or diagnosis of SARS-CoV-2 by FDA under an Emergency Use Authorization (EUA). This EUA will remain in effect (meaning this test can be used) for the duration of the COVID-19 declaration under Section 564(b)(1) of the Act, 21 U.S.C. section 360bbb-3(b)(1), unless the authorization is terminated or revoked.  Performed at [facility], [address], [place], [place] [address]   Ginger Carne 8, ED     Status: Abnormal   Collection Time: 06/07/20  4:26 PM  Result Value Ref Range   Sodium 137 135 - 145 mmol/L   Potassium 3.7 3.5 - 5.1 mmol/L   Chloride 94 (L) 98 - 111 mmol/L   BUN 19 8 - 23 mg/dL   Creatinine, Ser 0.50 (L) 0.61 - 1.24 mg/dL   Glucose, Bld 156 (H) 70 - 99 mg/dL    Comment: Glucose reference range applies only to samples taken after fasting for at least 8 hours.   Calcium, Ion 1.16 1.15 - 1.40 mmol/L   TCO2 33 (H) 22 - 32 mmol/L   Hemoglobin 14.6 13.0 - 17.0 g/dL   HCT 43.0 39.0 - 52.0 %  MRSA PCR Screening     Status: None   Collection Time: 06/07/20  9:30 PM   Specimen: Nasal Mucosa; Nasopharyngeal  Result Value Ref Range   MRSA by PCR NEGATIVE NEGATIVE    Comment:        The GeneXpert MRSA Assay (FDA approved for NASAL specimens only), is one component of a comprehensive MRSA colonization surveillance program. It is not intended to  diagnose MRSA infection nor to guide or monitor treatment for MRSA infections. Performed at [facility], [place] [address], [place], [place] [address]     CT Head Wo Contrast  Result Date: 06/07/2020 CLINICAL DATA:  History of non-small cell lung cancer. Acute presentation with left-sided weakness. EXAM: CT HEAD WITHOUT CONTRAST TECHNIQUE: Contiguous axial images were obtained from the base of the skull through the vertex without intravenous contrast. COMPARISON:  MRI 04/30/2017 FINDINGS: Brain: 4 cm left cerebellar mass with necrosis and hemorrhage. Mass effect with flattening of the fourth ventricle. Second 4 cm hemorrhagic necrotic mass at the right parietal vertex with moderate surrounding edema. Both of these are consistent with intracranial metastatic disease. Mild chronic small-vessel ischemic change seen elsewhere within the cerebral hemispheric white matter. No sign of ischemic infarction. No apparent hydrocephalus of the lateral or third ventricles. No extra-axial fluid collection. Vascular: There is atherosclerotic calcification of the major vessels at the base of the brain. Skull: Negative Sinuses/Orbits: Clear/normal Other: None IMPRESSION: 4 cm left cerebellar mass with necrosis and hemorrhage. 4 cm necrotic and hemorrhagic mass at the right parietal vertex with moderate surrounding edema. Findings consistent with intracranial metastatic disease. Some mass-effect upon the fourth ventricle but no sign of obstructive hydrocephalus at this moment. Electronically Signed   By: Jan Fireman.D.  On: 06/07/2020 16:29    Review of Systems Blood pressure 115/62, pulse 87, temperature 98.7 F (37.1 C), temperature source Axillary, resp. rate 20, height 5\' 9"  (1.753 m), weight 59 kg, SpO2 98 %. Physical Exam Constitutional:      General: He is in acute distress.     Appearance: He is ill-appearing.  HENT:     Head: Normocephalic.     Right Ear: Tympanic membrane normal.     Left Ear:  Tympanic membrane normal.     Nose: Nose normal.     Mouth/Throat:     Mouth: Mucous membranes are dry.     Pharynx: Oropharynx is clear.  Eyes:     General: Visual field deficit present.     Extraocular Movements: Extraocular movements intact.     Conjunctiva/sclera: Conjunctivae normal.     Pupils: Pupils are equal, round, and reactive to light.  Cardiovascular:     Rate and Rhythm: Normal rate and regular rhythm.     Pulses: Normal pulses.  Abdominal:     General: Abdomen is flat.  Musculoskeletal:        General: Normal range of motion.     Cervical back: Normal range of motion and neck supple.  Skin:    General: Skin is warm and dry.     Findings: Bruising present.  Neurological:     Mental Status: He is alert and oriented to person, place, and time.     Cranial Nerves: No dysarthria or facial asymmetry.     Sensory: Sensation is intact.     Motor: Weakness and pronator drift present.     Coordination: Coordination abnormal. Finger-Nose-Finger Test abnormal and Heel to [place] Test abnormal.     Comments: Gait not assessed.     Assessment/Plan: Adam Bass is a 70 y.o. 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 I believe the cerebellar mass should be resected, and possible radiation to the supratentorial lesion. Awaiting oncology consult, and anticipate a radiation oncology consult also. Will be admitted by the critical care team. Have placed on decadron.   Ashok Pall 06/08/2020, 12:05 AM

## 2020-06-09 DIAGNOSIS — Z7189 Other specified counseling: Secondary | ICD-10-CM

## 2020-06-09 DIAGNOSIS — G9389 Other specified disorders of brain: Secondary | ICD-10-CM

## 2020-06-09 DIAGNOSIS — Z515 Encounter for palliative care: Secondary | ICD-10-CM

## 2020-06-09 DIAGNOSIS — I629 Nontraumatic intracranial hemorrhage, unspecified: Secondary | ICD-10-CM

## 2020-06-09 DIAGNOSIS — C799 Secondary malignant neoplasm of unspecified site: Secondary | ICD-10-CM

## 2020-06-09 DIAGNOSIS — F419 Anxiety disorder, unspecified: Secondary | ICD-10-CM

## 2020-06-09 DIAGNOSIS — R0602 Shortness of breath: Secondary | ICD-10-CM

## 2020-06-09 MED ORDER — BENZONATATE 100 MG PO CAPS: 100.0000 mg | ORAL_CAPSULE | Freq: Two times a day (BID) | ORAL | Status: DC | PRN

## 2020-06-09 MED ORDER — ZOLPIDEM TARTRATE 5 MG PO TABS
5.0000 mg | ORAL_TABLET | Freq: Every day | ORAL | Status: DC
  Administered 2020-06-09 – 2020-06-10 (×2): 5 mg via ORAL
  Filled 2020-06-09 (×2): qty 1

## 2020-06-09 MED ORDER — DEXAMETHASONE 4 MG PO TABS
6.0000 mg | ORAL_TABLET | Freq: Two times a day (BID) | ORAL | Status: DC
  Administered 2020-06-09 – 2020-06-11 (×4): 6 mg via ORAL
  Filled 2020-06-09 (×4): qty 1

## 2020-06-09 MED ORDER — ZOLPIDEM TARTRATE 5 MG PO TABS: 5.0000 mg | ORAL_TABLET | Freq: Every evening | ORAL | Status: DC | PRN

## 2020-06-09 MED ORDER — DOXYCYCLINE HYCLATE 100 MG PO TABS
100.0000 mg | ORAL_TABLET | Freq: Two times a day (BID) | ORAL | Status: DC
  Administered 2020-06-09 – 2020-06-11 (×5): 100 mg via ORAL
  Filled 2020-06-09 (×5): qty 1

## 2020-06-09 MED ORDER — ALUM & MAG HYDROXIDE-SIMETH 200-200-20 MG/5ML PO SUSP
30.0000 mL | ORAL | Status: DC | PRN
  Administered 2020-06-09 – 2020-06-10 (×2): 30 mL via ORAL
  Filled 2020-06-09 (×2): qty 30

## 2020-06-09 MED ORDER — MONTELUKAST SODIUM 10 MG PO TABS
10.0000 mg | ORAL_TABLET | Freq: Every day | ORAL | Status: DC
  Administered 2020-06-09 – 2020-06-10 (×2): 10 mg via ORAL
  Filled 2020-06-09 (×2): qty 1

## 2020-06-09 MED ORDER — MORPHINE SULFATE (CONCENTRATE) 10 MG/0.5ML PO SOLN
2.5000 mg | ORAL | Status: DC
  Administered 2020-06-09 – 2020-06-11 (×12): 2.6 mg via ORAL
  Filled 2020-06-09 (×12): qty 0.5

## 2020-06-09 MED ORDER — MORPHINE SULFATE (CONCENTRATE) 10 MG/0.5ML PO SOLN: 5.0000 mg | ORAL | Status: DC | PRN

## 2020-06-09 MED ORDER — GLYCOPYRROLATE 0.2 MG/ML IJ SOLN
0.2000 mg | Freq: Two times a day (BID) | INTRAMUSCULAR | Status: DC
  Administered 2020-06-09 – 2020-06-11 (×5): 0.2 mg via INTRAVENOUS
  Filled 2020-06-09 (×5): qty 1

## 2020-06-09 MED ORDER — NYSTATIN 100000 UNIT/ML MT SUSP
5.0000 mL | Freq: Four times a day (QID) | OROMUCOSAL | Status: DC
  Administered 2020-06-09 – 2020-06-11 (×9): 500000 [IU] via ORAL
  Filled 2020-06-09 (×8): qty 5

## 2020-06-09 MED FILL — Ipratropium-Albuterol Nebu Soln 0.5-2.5(3) MG/3ML: RESPIRATORY_TRACT | Qty: 3 | Status: AC

## 2020-06-09 NOTE — Consult Note (Signed)
Consultation Note Date: 06/09/2020   Patient Name: Adam Bass  DOB: 26-Sep-1950  MRN: [id number]  Age / Sex: 70 y.o., adult  PCP: [facility] Referring Physician: Spero Geralds, MD  Reason for Consultation: Hospice Evaluation, Non pain symptom management and Psychosocial/spiritual support  HPI/Patient Profile: 70 y.o. adult  with past medical history of metastatic non small cell lung cancer with treatment and recurrence, liver cancer, COPD who was admitted on 06/07/2020 with weakness.  He was found to have a large cerebellar mass which was felt to be life threatening.  After discussions with the medical team the patient and family opted again surgery.  He would appreciate assistance with symptom management and Hospice arrangements.   Clinical Assessment and Goals of Care:  I have reviewed medical records including EPIC notes, labs and imaging, received report from the care team, examined the patient and met at bedside with Mr. Schools  to discuss diagnosis prognosis, [place], EOL wishes, disposition and options.  I introduced Palliative Medicine as specialized medical care for people living with serious illness. It focuses on providing relief from the symptoms and stress of a serious illness.   We discussed a brief life review of the patient. He is from [place], [place] and is currently followed by outpatient palliative care. He has two daughters and three granddaughters that live locally as his primary support. He is divorced after two previous marriages and reports a good relationship with his ex-spouses. Family is his most important value. He also values his friends and faith in [place]. He lives alone in a rental home.  As far as functional and nutritional status, he notes a recent decline in his ability to independently accomplish ADLs. He states that he was crawling to the front door when  his friend visited him this week, which then led to his arrival at [facility]. His progressive weakness is a main contributing factor in his decision to pursue hospice at this point in his illness. He discussed code status with his daughters yesterday evening and shares that he does NOT wish for resuscitation or intubation attempts.   We discussed his current illness and what it means in the larger context of on-going co-morbidities. Natural disease trajectory and expectations at EOL were discussed. Therapeutic listening and emotional support provided.  I attempted to elicit values and goals of care important to the patient. His goal is a peaceful death and relief from his current symptoms including shortness of breath, anxiety, congestion, and difficulty sleeping.    The difference between aggressive medical intervention and comfort care was considered in light of the patient's goals of care. Provided reassurance that he would be able to keep his oxygen as a comfort measure. He is currently on 4-5L [place].  Advanced directives, concepts specific to code status, artifical feeding and hydration, and rehospitalization were considered and discussed.  Hospice and Palliative Care services outpatient were explained and offered.  Questions and concerns were addressed. PMT contact information was provided. The patient was encouraged to call with  questions or concerns.      Primary Decision Maker:  PATIENT Next of kin/HCPOA = daughters Lenna [place] (primary) and [place] (secondary)   Discussed with patient, RN/Jamie, Florentina Jenny PA-C, Eric Form NP, and Dr. Shearon Stalls.    SUMMARY OF RECOMMENDATIONS     Code status changed to DNR/DNI.  Very low dose morphine scheduled for anxiety and dyspnea.  Low dose morphine ordered PRN for pain/ dyspnea.  Ambien scheduled QHS.  Dose can be repeated if not effective.  Very low dose scheduled robinul for secretions.  Agree with PRN Xanax.  Reduced decadron to bid.  Would taper to once a day over time.  Patient repositioned and assisted with arrangement of bedside belongings.  TOC order placed for [facility] services in the home.  Patient will need supplies delivered to the house before discharge.  Melissa (dtr) is the contact.  Updated medical team.   Code Status/Advance Care Planning:  DNR   Symptom Management:   As above  Additional Recommendations (Limitations, Scope, Preferences):  Full Comfort Care  Palliative Prophylaxis:   Frequent Pain Assessment and frequent assessment for dyspnea  Psycho-social/Spiritual:   Desire for further Chaplaincy support: Yes  Prognosis: weeks to perhaps months.  However he is at very high risk of acute decline and death from either respiratory failure or very large cerebellar mass.   Discharge Planning: Home with Hospice      Primary Diagnoses: Present on Admission: . 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 The following aspects are pertinent.  Past Medical History:  Diagnosis Date  . Anticoagulant long-term use    brillinta  . Bilateral lower extremity edema    feet, noticed since discharge from hospital admission 05-18-2018, "i was laid up in the bed for five days"   . CAD (coronary artery disease) cardiologist-- dr j. branch   hx STEMI 11-23-2017,  s/p  cardiac cath with Thrombectomy, PCI and DES x1 to midRCA, and other nonobstrucitve CAD involving pLAD, OM1, dRCA, posterior atrio, LVEF 45-50%,  LVEDP 62mHg  . COPD (chronic obstructive pulmonary disease) with emphysema ([facility]    pulmonology-- dr wert-- GOLD III mix (pt still smokes),  last exacerbation -due to the flu , [facility]  . DOE (dyspnea on exertion)   . [place]atic abdominal aorta ([place]    MRI 11/ 2019  . Hepatitis C GI-- dr rLaural Golden  dx 02-02-2018--- currently treated on antiviral medication  .  [place]atocellular [place]   . History of radiation therapy 06/03/17- 06/09/17   Left Lung treated to 54 Gy with 3 fx of 18 Gy. SBRT  . History of skin cancer    04/ 2017 excision from face  . History of ST elevation myocardial infarction (STEMI) 11/23/2017   inferior wall-- s/p cardiac cath w/ thrombectomy, PCI, and DES  . HLD (hyperlipidemia)   . Hypertension   . Myocardial infarction ([place]   . Primary squamous cell carcinoma of left lung ([facility] oncologist-- dr sIsidore Moos  dx 02/ 2018-- Stage IA2 Left lower lung,  s/p  Stereotactic radiation completed 06-09-2017  . Productive cough    per pt mostly clear  . Requires supplemental oxygen    06-09-2018 currently pt is using a portable O2 as needed and at night  . S/P drug eluting coronary stent placement 11/23/2017   DES x1 to midRCA  . Urinary incontinence    wear depends  .  Wears dentures    fuller upper,  lower partial  . Wears glasses    Social History   Socioeconomic History  . Marital status: Divorced    Spouse name: Not on file  . Number of children: 2  . Years of education: Not on file  . Highest education level: Not on file  Occupational History  . Not on file  Tobacco Use  . Smoking status: Current Every Day Smoker    Packs/day: 0.75    Years: 50.00    Pack years: 37.50    Types: Cigars  . Smokeless tobacco: Never Used  . Tobacco comment: . He is smoking about 5-10 cigarettes daily.   Vaping Use  . Vaping Use: Former  . Quit date: 10/07/2017  Substance and Sexual Activity  . Alcohol use: Yes    Alcohol/week: 8.0 standard drinks    Types: 8 Cans of beer per week    Comment: 8 beers weekly, give or take   . Drug use: No  . Sexual activity: Not on file  Other Topics Concern  . Not on file  Social History Narrative  . Not on file   Social Determinants of Health   Financial Resource Strain: Not on file  Food Insecurity: Not on file  Transportation Needs: Not on file  Physical Activity: Not on file   Stress: Not on file  Social Connections: Not on file   Family History  Problem Relation Age of Onset  . Breast cancer Sister   . Colon cancer Neg Hx     Allergies  Allergen Reactions  . Bee Venom Hives      Vital Signs: BP (!) 110/58   Pulse 70   Temp 99.5 F (37.5 C) (Oral)   Resp 19   Ht _0  (1.753 m)   Wt 59 kg   SpO2 99%   BMI 19.20 kg/m  Pain Scale: 0-10   Pain Score: 0-No pain   SpO2: SpO2: 99 % O2 Device:SpO2: 99 % O2 Flow Rate: .O2 Flow Rate (L/min): 4 L/min    Palliative Assessment/Data: 30%    Time Total: 70 minutes  Visit consisted of counseling and education dealing with the complex and emotionally intense issues surrounding the need for palliative care and symptom management in the setting of serious and potentially life-threatening illness. Greater than 50% of this time was spent counseling and coordinating care related to the above assessment and plan.  Signed by: Dominica Severin, PA-C Florentina Jenny, PA-C Palliative Medicine  Please contact Palliative Medicine Team phone at [phone] for questions and concerns.  For individual provider: See Shea Evans

## 2020-06-09 NOTE — Progress Notes (Addendum)
NAME:  Adam Bass, MRN:  [id number], DOB:  September 25, 1950, LOS: 2 ADMISSION DATE:  06/07/2020, CONSULTATION DATE:  06/07/2020 REFERRING MD:  Martinique Robinson, PA, CHIEF COMPLAINT:  Weakness  Brief History:  70 year old male with metastatic NSCLC presenting with generalized weakness found to have a left cerebellar mass.  To be transferred to [facility] for further neurological and NSGY evaluation.  PCCM to admit.   History of Present Illness:  70 year old male with prior history of primary metastatic [place] lung cancer the left (dx 2018 s/p SRBT with recent radiation 12/14- 05/16/2020 due to poor functional status w/ recurrent large hypermetabolic left lower lobe lung mass confirmed by biopsy 02/29/2020), alcohol use, COPD on home O2 4-5L [place], CAD, hepatitis C s/p tx 2020, hepatocellular carcinoma (dx 05/2018), hypertension, and MAC infection presenting to [facility] on 2/23 with weakness that started yesterday.  Patient is a poor historian.  Has felt unwell for some time.  Went to bed last night around 2200 but woke up around 0200 today with generalized weakness and then again around 0500 with continued weakness (without particular localization to one side).  Has been unsteady on his feet today and possible blurred vision.  Denies headache, numbness, or loss of vision.  He does take a baby aspirin every day.  Patient does live alone and independently but of late has more sedentary but still able to perform his ADLs.    On arrival to [facility] ER, code stroke activated.  No focal deficits noted in ER and was alert and oriented.  CTH showed a 4cm left cerebellar mass with necrosis and hemorrhage with moderate surrounding edema consistent with metastatic disease; some mass effect on fourth ventricle but no signs of obstructive hydrocephalus at this time.  Normal coags.  Has been afebrile and hemodynamically stable and protecting his airway.  In ER, neurology, NSGY and oncology contacted by EDP who recommended transfer to [facility] Neuro  ICU and decadron IV. Neurosurgery to see in consultation on arrival.  PCCM asked to admit to ICU.   Past Medical History:  Primary [place] lung cancer (left), alcohol use, tobacco (cigar) abuse, COPD, CAD, hepatitis C, hepatocellular carcinoma, hypertension, MAC infection, GERD, HLD, NSTEMI/ CAD w/ stent 2019  Significant Hospital Events:  2/23 tx from [facility] to [facility]  Consults:  NSGY  Procedures:   Significant Diagnostic Tests:  2/23 [facility] >> 4 cm left cerebellar mass with necrosis and hemorrhage. 4 cm necrotic and hemorrhagic mass at the right parietal vertex with moderate surrounding edema. Findings consistent with intracranial metastatic disease. Some mass-effect upon the fourth ventricle but no sign of obstructive hydrocephalus at this moment.  Micro Data:  2/23 SARS/ flu >> neg  Antimicrobials:  n/a  Interim History / Subjective:  Awake and alert, in NAD, states he has some trouble with co-ordination Oral Thrush T Max 99.6 On 4 L [place] with adequate sats  Net - 340 cc's, taking PO/s  Objective   Blood pressure (!) 141/52, pulse 65, temperature 99.5 F (37.5 C), temperature source Oral, resp. rate 17, height _0  (1.753 m), weight 59 kg, SpO2 99 %.        Intake/Output Summary (Last 24 hours) at 06/09/2020 0848 Last data filed at 06/09/2020 0700 Gross per 24 hour  Intake 1706.53 ml  Output 2800 ml  Net -1093.47 ml   Filed Weights   06/07/20 1629  Weight: 59 kg    General:  Thin, elderly M in no acute distress HEENT: MM pink/moist,  Oral thrush noted Neuro: awake, alert, oriented, moving all extremities and following commands CV: s1s2 rrr, no m/r/g PULM:  On 4L O2, no distress or wheezing, strong productive cough, rhonchi GI: soft, bsx4 active  Extremities: warm/dry, no edema , no obvious deformities Skin: no rashes or lesions   Resolved Hospital Problem list     Assessment & Plan:   Left cerebellar mass with necrosis and hemorrhage with left-sided focal  deficits Presented to [facility] 2/23AM via EMS for left-sided weakness and unsteady gait. CT Brain demonstrated 4cm L cerebellar mass with necrosis and hemorrhage. Neuro, Neurosurgery, Oncology aware. - Neurology (Dr. Cheral Marker) and Neurosurgery consulted (Dr. Christella Noa), recommending CCM ICU admission - Seen by Neurosurgery, suspect that the cerebellar mass should be resected however patient has decided against treatment>> planning home with Hospice - Continue IV Decadron - Frequent neuro checks - While patient has decided on Hospice, he remains a full Code. I confirmed this with him. This is his desire.  Squamous cell carcinoma of LLL Pt. Desires Hospice Care History of squamous cell/NSCLC, s/p SBRT (L lung) 05/2017, IMRT (L lung) 03/2020 - 05/2020 (recently completed). Followed by Dr. Delton Coombes. - Due for repeat CT Chest for restaging >> will allow patient to determine if he does want this done.   COPD Baseline O2 requirement of 4-5L via . Followed by Dr. Melvyn Novas as an outpatient. - Continue home inhalers (Trelegy, Albuterol PRN) - Will treat oral thrush  With Nystatin oral solution - Will start oral Doxycycline BID x 7 days for COPD flare, low grade temp  Hepatocellular carcinoma likely 2/2 Hepatitis C History of HCC s/p ablation with 71m LI-RADS 3 lesion in segment 8, several additional LI-RADS 3 lesions. - Continue intermittent surveillance monitoring if patient desires   History of STEMI Hyperlipidemia - Lipitor at home   Will transfer to floor and have [facility] assume care starting 2/26 am.  Pt. Wants to be a full Code at present, Hospice will discuss this with him. I believe he needs to be a NCB to qualify for Hospice. Orders placed for Case management and Social Work.  Still FC, but this will have to change for Hospice, he is working through it now. I have spoken with him at length this morning about no intubation . He wants to discuss with his family, but I feel minimally he will agree  to this once he discusses this with his family.  Will treat with Doxycycline for low grade temp and suspected COPD flare  Best practice (evaluated daily)  Diet: regular Pain/Anxiety/Delirium protocol (if indicated): low dose Xanax VAP protocol (if indicated): n/a DVT prophylaxis: SCD's GI prophylaxis: n/a Glucose control: SSI Mobility: bed rest Disposition:ICU  Goals of Care:  Last date of multidisciplinary goals of care discussion: Family and staff present:  Summary of discussion:  Follow up goals of care discussion due: 3/4 Code Status: full code  Labs   CBC: Recent Labs  Lab 06/07/20 1617 06/07/20 1626 06/08/20 0250  WBC 9.5  --  8.0  NEUTROABS 8.5*  --   --   HGB 13.5 14.6 13.3  HCT 41.2 43.0 39.1  MCV 103.3*  --  100.3*  PLT 284  --  2314   Basic Metabolic Panel: Recent Labs  Lab 06/07/20 1617 06/07/20 1626 06/08/20 0250  NA 136 137  --   K 3.7 3.7  --   CL 94* 94*  --   CO2 30  --   --   GLUCOSE 160* 156*  --  BUN 17 19  --   CREATININE 0.48* 0.50* 0.54*  CALCIUM 9.2  --   --    GFR: Estimated Creatinine Clearance (by C-G formula based on SCr of 0.54 mg/dL (L)) Male: 60.9 mL/min (A) Male: 71.7 mL/min (A) Recent Labs  Lab 06/07/20 1617 06/08/20 0250  WBC 9.5 8.0    Liver Function Tests: Recent Labs  Lab 06/07/20 1617  AST 33  ALT 31  ALKPHOS 100  BILITOT 0.8  PROT 7.3  ALBUMIN 3.7   No results for input(s): LIPASE, AMYLASE in the last 168 hours. No results for input(s): AMMONIA in the last 168 hours.  ABG    Component Value Date/Time   PHART 7.444 05/06/2017 1407   PCO2ART 38.9 05/06/2017 1407   PO2ART 75.4 (L) 05/06/2017 1407   HCO3 26.7 05/06/2017 1407   TCO2 33 (H) 06/07/2020 1626   O2SAT 95.9 05/06/2017 1407     Coagulation Profile: Recent Labs  Lab 06/07/20 1617  INR 1.1    Cardiac Enzymes: No results for input(s): CKTOTAL, CKMB, CKMBINDEX, TROPONINI in the last 168 hours.  HbA1C: Hgb A1c MFr Bld  Date/Time  Value Ref Range Status  11/24/2017 12:24 AM 5.0 4.8 - 5.6 % Final    Comment:    (NOTE)         Prediabetes: 5.7 - 6.4         Diabetes: >6.4         Glycemic control for adults with diabetes: <7.0     CBG: No results for input(s): GLUCAP in the last 168 hours.   Allergies Allergies  Allergen Reactions  . Bee Venom Hives     Home Medications  Prior to Admission medications   Medication Sig Start Date End Date Taking? Authorizing Provider  albuterol (PROAIR HFA) 108 (90 Base) MCG/ACT inhaler Inhale 2 puffs into the lungs every 6 (six) hours as needed for wheezing.    Yes [provider]  ALPRAZolam (XANAX) 0.5 MG tablet Take 0.25-0.5 mg by mouth 3 (three) times daily as needed for anxiety.   Yes [provider]  Ascorbic Acid (VITAMIN C PO) Take 1 tablet by mouth daily.   Yes [provider]  aspirin EC 81 MG tablet Take 81 mg by mouth daily.    Yes [provider]  atorvastatin (LIPITOR) 20 MG tablet TAKE 1 TABLET(20 MG) BY MOUTH DAILY Patient taking differently: Take 20 mg by mouth daily. 03/22/20  Yes BranchAlphonse Guild, MD  b complex vitamins capsule Take 1 capsule by mouth daily.   Yes [provider]  bisoprolol (ZEBETA) 5 MG tablet TAKE 1/2 TABLET BY MOUTH EVERY DAY Patient taking differently: Take 2.5 mg by mouth daily. 10/19/19  Yes BranchAlphonse Guild, MD  Cholecalciferol (VITAMIN D3 PO) Take 1 capsule by mouth daily.   Yes [provider]  citalopram (CELEXA) 20 MG tablet Take 20 mg by mouth daily. 06/07/20  Yes [provider]  esomeprazole (NEXIUM) 40 MG capsule Take 40 mg by mouth daily.    Yes [provider]  ezetimibe (ZETIA) 10 MG tablet Take 1 tablet (10 mg total) by mouth daily. 11/27/17 02/25/20 Yes Cheryln Manly, NP  Fluticasone-Umeclidin-Vilant (TRELEGY ELLIPTA) 100-62.5-25 MCG/INH AEPB INHALE 1 PUFF INTO THE LUNGS EVERY DAY Patient taking differently: Inhale 1 puff into the lungs daily.  08/28/19  Yes Wurst, [place], PA-C  hydrocortisone cream 1 % Apply to affected area 2 times daily Patient taking differently: Apply 1 application topically 2 (  two) times daily as needed for itching. 12/06/19  Yes Avegno, Darrelyn Hillock, FNP  montelukast (SINGULAIR) 10 MG tablet One at bedtime every night Patient taking differently: Take 10 mg by mouth at bedtime. 10/15/19  Yes Tanda Rockers, MD  Multiple Vitamins-Minerals (MULTIVITAMIN WITH MINERALS) tablet Take 1 tablet by mouth daily.   Yes [provider]  nitroGLYCERIN (NITROSTAT) 0.4 MG SL tablet PLACE 1 TABLET UNDER THE TONGUE EVERY 5 MINUTES AS NEEDED. FOLLOW MD DIRECTIONS FOR WHEN TO CALL 911 Patient taking differently: Place 0.4 mg under the tongue every 5 (five) minutes as needed for chest pain. 01/10/20  Yes BranchAlphonse Guild, MD  OXYGEN Inhale into the lungs as needed. 4 Liters supplemental o2 , as needed "when im up moving around"   Yes [provider]  oxymetazoline (AFRIN) 0.05 % nasal spray Place 1 spray into both nostrils 2 (two) times daily as needed for congestion.    Yes [provider]  tamsulosin (FLOMAX) 0.4 MG CAPS capsule Take 1 capsule (0.4 mg total) by mouth daily. 05/11/20  Yes Truitt Merle, MD  Tetrahydrozoline HCl (VISINE OP) Place 1 drop into both eyes daily as needed (irritation).   Yes [provider]  benzonatate (TESSALON) 200 MG capsule TAKE 1 CAPSULE(200 MG) BY MOUTH THREE TIMES DAILY AS NEEDED FOR COUGH Patient not taking: No sig reported 05/09/20   Eppie Gibson, MD  famotidine-calcium carbonate-magnesium hydroxide (PEPCID COMPLETE) 10-800-165 MG chewable tablet Chew 1 tablet by mouth daily as needed (acid reflux). Patient not taking: No sig reported    [provider]  lidocaine (XYLOCAINE) 2 % solution Use as directed 10 mLs in the mouth or throat as needed for mouth pain. Patient not taking: No sig reported 04/26/20   Eppie Gibson, MD  Multiple Vitamins-Minerals (ZINC PO)  Take 1 tablet by mouth daily. Patient not taking: No sig reported    [provider]  nicotine (NICODERM CQ - DOSED IN MG/24 HOURS) 21 mg/24hr patch Place 21 mg onto the skin daily. Patient not taking: No sig reported    [provider]     Critical care time: 35 minutes     CRITICAL CARE Performed by: Magdalen Spatz   Total critical care time: 35 minutes  Critical care time was exclusive of separately billable procedures and treating other patients.  Critical care was necessary to treat or prevent imminent or life-threatening deterioration.  Critical care was time spent personally by me on the following activities: development of treatment plan with patient and/or surrogate as well as nursing, discussions with consultants, evaluation of patient's response to treatment, examination of patient, obtaining history from patient or surrogate, ordering and performing treatments and interventions, ordering and review of laboratory studies, ordering and review of radiographic studies, pulse oximetry and re-evaluation of patient's condition.   Magdalen Spatz, MSN, AGACNP-BC [place] See Amion for personal pager PCCM on call pager [phone]

## 2020-06-09 NOTE — Progress Notes (Signed)
Pt has arrived to the unit. Pt has all belongings and equipment sent with him. Daughter is at bedside. Telephone and Call Light are within reach.  06/09/20 1209  Vitals  Temp 97.7 F (36.5 C)  Temp Source Oral  BP 124/62  MAP (mmHg) 79  BP Location Left Arm  BP Method Automatic  Patient Position (if appropriate) Lying  Pulse Rate Source Dinamap  ECG Heart Rate 70  Resp 20  Level of Consciousness  Level of Consciousness Alert  MEWS COLOR  MEWS Score Color Green  Oxygen Therapy  SpO2 100 %  O2 Device Nasal Cannula  O2 Flow Rate (L/min) 5 L/min  Pain Assessment  Pain Scale 0-10  Pain Score 5  Pain Type Acute pain  Pain Location Generalized  Pain Descriptors / Indicators Sore  Pain Frequency Occasional  Patients Stated Pain Goal 0  Pain Intervention(s) Medication (See eMAR) (Scheduled Med was given)  MEWS Score  MEWS Temp 0  MEWS Systolic 0  MEWS Pulse 0  MEWS RR 0  MEWS LOC 0  MEWS Score 0

## 2020-06-10 DIAGNOSIS — Z7189 Other specified counseling: Secondary | ICD-10-CM

## 2020-06-10 DIAGNOSIS — F419 Anxiety disorder, unspecified: Secondary | ICD-10-CM

## 2020-06-10 DIAGNOSIS — Z515 Encounter for palliative care: Secondary | ICD-10-CM

## 2020-06-10 DIAGNOSIS — R0602 Shortness of breath: Secondary | ICD-10-CM

## 2020-06-10 MED ORDER — ALPRAZOLAM 0.5 MG PO TABS: 0.5000 mg | ORAL_TABLET | Freq: Four times a day (QID) | ORAL | 0 refills | Status: AC | PRN

## 2020-06-10 MED ORDER — MORPHINE SULFATE (CONCENTRATE) 10 MG/0.5ML PO SOLN: 5.0000 mg | ORAL | 0 refills | Status: AC | PRN

## 2020-06-10 MED ORDER — DOCUSATE SODIUM 100 MG PO CAPS
100.0000 mg | ORAL_CAPSULE | Freq: Two times a day (BID) | ORAL | 0 refills | Status: AC
Start: 2020-06-10 — End: ?

## 2020-06-10 MED ORDER — DEXAMETHASONE 6 MG PO TABS: 6.0000 mg | ORAL_TABLET | Freq: Two times a day (BID) | ORAL | 0 refills | Status: AC

## 2020-06-10 MED ORDER — ATROPINE SULFATE 1 % OP SOLN
2.0000 [drp] | Freq: Four times a day (QID) | OPHTHALMIC | Status: DC | PRN
  Filled 2020-06-10: qty 2

## 2020-06-10 MED ORDER — ONDANSETRON HCL 4 MG PO TABS: 4.0000 mg | ORAL_TABLET | Freq: Four times a day (QID) | ORAL | 0 refills | Status: AC | PRN

## 2020-06-10 MED ORDER — ZOLPIDEM TARTRATE 5 MG PO TABS: 5.0000 mg | ORAL_TABLET | Freq: Every day | ORAL | 0 refills | Status: DC

## 2020-06-10 MED ORDER — DOXYCYCLINE HYCLATE 100 MG PO TABS: 100.0000 mg | ORAL_TABLET | Freq: Two times a day (BID) | ORAL | 0 refills | Status: DC

## 2020-06-10 NOTE — Progress Notes (Signed)
Daily Progress Note   Patient Name: Adam Bass       Date: 06/10/2020 DOB: [birth year]  Age: 70 y.o. MRN#: [id number] Attending Physician: Barb Merino, MD Primary Care Physician: [facility] Admit Date: 06/07/2020  Reason for Consultation/Follow-up: Establishing goals of care and Hospice Evaluation  Subjective: Patient awake, alert, oriented, able to participate in discussion. No family at bedside. Currently on 5L [place].  Adam Bass does feel like his dyspnea is improved with low-dose scheduled morphine. He is not sleeping well in the hospital and is slightly anxious. RN to bring prn xanax soon. Patient agreeable to try xanax for his nerves and hopes it will help him rest this afternoon.   Discussed care plan including plan for home with hospice services through [facility]. He will discharge to his daughter, Hunters Bass. Answered questions regarding plan and symptom management medications.    Length of Stay: 3  Current Medications: Scheduled Meds:  . Chlorhexidine Gluconate Cloth  6 each Topical Daily  . dexamethasone  6 mg Oral Q12H  . docusate sodium  100 mg Oral BID  . doxycycline  100 mg Oral Q12H  . glycopyrrolate  0.2 mg Intravenous BID  . heparin  5,000 Units Subcutaneous Q8H  . mometasone-formoterol  2 puff Inhalation BID  . montelukast  10 mg Oral QHS  . morphine CONCENTRATE  2.6 mg Oral Q4H  . nicotine  14 mg Transdermal Daily  . nystatin  5 mL Oral QID  . tamsulosin  0.4 mg Oral Daily  . zolpidem  5 mg Oral QHS    Continuous Infusions: . 0.9 % NaCl with KCl 20 mEq / L 75 mL/hr at 06/09/20 2021    PRN Meds: acetaminophen **OR** acetaminophen, albuterol, ALPRAZolam, alum & mag hydroxide-simeth, atropine, benzonatate, bisacodyl,  magnesium citrate, morphine injection, morphine CONCENTRATE, ondansetron **OR** ondansetron (ZOFRAN) IV, senna-docusate, zolpidem  Physical Exam Vitals and nursing note reviewed.  Constitutional:      General: He is awake.     Appearance: He is cachectic. He is ill-appearing.  Cardiovascular:     Rate and Rhythm: Normal rate.  Pulmonary:     Effort: No tachypnea, accessory muscle usage or respiratory distress.     Comments: Dyspnea at rest. 5L. RN at bedside giving roxanol.  Skin:    General: Skin is  warm and dry.  Neurological:     Mental Status: He is alert and oriented to person, place, and time.  Psychiatric:        Mood and Affect: Mood is anxious.        Speech: Speech normal.        Behavior: Behavior normal.        Cognition and Memory: Cognition normal.            Vital Signs: BP (!) 143/68 (BP Location: Left Arm)   Pulse 82   Temp 98.2 F (36.8 C) (Oral)   Resp 19   Ht _0  (1.753 m)   Wt 61.4 kg   SpO2 98%   BMI 19.99 kg/m  SpO2: SpO2: 98 % O2 Device: O2 Device: Nasal Cannula O2 Flow Rate: O2 Flow Rate (L/min): 5 L/min  Intake/output summary:   Intake/Output Summary (Last 24 hours) at 06/10/2020 0946 Last data filed at 06/10/2020 5093 Gross per 24 hour  Intake --  Output 1650 ml  Net -1650 ml   LBM: Last BM Date: 06/08/20 Baseline Weight: Weight: 59 kg Most recent weight: Weight: 61.4 kg       Palliative Assessment/Data: PPS 40%      Patient Active Problem List   Diagnosis Date Noted  . Goals of care, counseling/discussion   . Palliative care by specialist   . Shortness of breath   . Anxiety   . Adenocarcinoma, metastatic ([place]) 06/08/2020  . Mass of brain   . Intracranial bleeding ([place]) 06/07/2020  . 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 NSCLC of left lung ([place]) 03/15/2020  . S/P bronchoscopy with biopsy   . Mediastinal adenopathy   . Lung mass 02/25/2020  .                                                                                        01/04/2020  . Chronic respiratory failure with hypoxia ([place]) 09/17/2019  . Essential hypertension 09/17/2019  . GERD (gastroesophageal reflux disease) 06/15/2019  . 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 Hepatocellular carcinoma ([place]) 06/10/2018  . Influenza due to identified novel influenza A virus with other respiratory manifestations 05/17/2018  . Coronary artery disease, occlusive 05/16/2018  . COPD exacerbation ([place]) 05/13/2018  . Hepatitis C 05/13/2018  . Smoking 11/27/2017  . ETOH abuse 11/27/2017  . Heart attack ([place])   . Hyperlipidemia with target LDL less than 70   . LFT elevation   . Acute ST elevation myocardial infarction (STEMI) of inferior wall ([place]) 11/23/2017  . 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 Primary cancer of left lower lobe of lung ([place]) 04/30/2017  . COPD GOLD III/ still smoking cigars  07/21/2012    Palliative Care Assessment & Plan   Patient Profile: 70 y.o. adult  with past medical history of metastatic non small cell lung cancer with treatment and recurrence, liver cancer, COPD who was admitted on 06/07/2020 with weakness.  He was found to have a large cerebellar mass which was felt to be life threatening.  After discussions with  the medical team the patient and family opted against surgery.  He would appreciate assistance with symptom management and Hospice arrangements.   Assessment: Metastatic squamous cell carcinoma of LLL Left cerebellar mass with necrosis and hemorrhage Left-sided focal deficits COPD  Hepatocellular carcinoma likely from hepatitis C Hx of STEMI  Recommendations/Plan: Continue current plan of care and medical management inpatient. Patient/family forgo surgical intervention.  Plan is for home with hospice services. Patient will discharge to daughter's home, Adam Bass. Continue current symptom management regimen. Patient  reports relief from scheduled Roxanol. Comfort feeds per patient/family request. Continue oxygen for comfort/symptoms. Treat dyspnea with prn Roxanol.  Allow visitors. TOC team to arrange home with hospice services.   Code Status: DNR/DNI   Code Status Orders  (From admission, onward)         Start     Ordered   06/09/20 1117  Do not attempt resuscitation (DNR)  Continuous       Question Answer Comment  In the event of cardiac or respiratory ARREST Do not call a "code blue"   In the event of cardiac or respiratory ARREST Do not perform Intubation, CPR, defibrillation or ACLS   In the event of cardiac or respiratory ARREST Use medication by any route, position, wound care, and other measures to relive pain and suffering. May use oxygen, suction and manual treatment of airway obstruction as needed for comfort.      06/09/20 1116        Code Status History    Date Active Date Inactive Code Status Order ID Comments User Context   06/08/2020 0001 06/09/2020 1116 Full Code [id number]  Ashok Pall, MD Inpatient   05/13/2018 2322 05/18/2018 2130 Full Code [id number]  Bethena Roys, MD Inpatient   11/23/2017 1750 11/27/2017 1759 Full Code [id number]  Jettie Booze, MD Inpatient   Advance Care Planning Activity    Advance Directive Documentation   Flowsheet Row Most Recent Value  Type of Advance Directive Healthcare Power of Attorney  Pre-existing out of facility DNR order (yellow form or pink MOST form) --  "MOST" Form in Place? --      Prognosis:  Poor long-term  Discharge Planning: Home with Hospice  Care plan was discussed with patient, RN  Thank you for allowing the Palliative Medicine Team to assist in the care of this patient.   Total Time 15 Prolonged Time Billed  no      Greater than 50%  of this time was spent counseling and coordinating care related to the above assessment and plan.  Ihor Dow, DNP, FNP-C Palliative Medicine Team  Phone:  [phone] Fax: [phone]  Please contact Palliative Medicine Team phone at [phone] for questions and concerns.

## 2020-06-10 NOTE — TOC Initial Note (Addendum)
Transition of Care [facility]) - Initial/Assessment Note    Patient Details  Name: Adam Bass MRN: [id number] Date of Birth: 1951/01/21  Transition of Care [facility]) CM/SW Contact:    Carles Collet, RN Phone Number: 06/10/2020, 12:47 PM  Clinical Narrative:                 Damaris Schooner w patient and daughter at bedside. Added other daughter Lenna Sciara by phone. Patient followed by [facility] for palliative services and has home oxygen per family. Ptainet will DC via PTAR to daughter Melissa's house at  [place] [address] once hospital bed, oxygen WC 3/1 has been delivered/ set up. Daughter to move his oxygen concentrator to her house from his house today. Spoke w Otila Kluver at [facility] who will contact [employer] about DME delivery and get back to me. CM will continue to follow.   14:00 Spoke with Otila Kluver at hospice, all DME will delivered and set up in daughter's house and ready for DC tomorrow AM. MD updated.    Expected Discharge Plan: Home w Hospice Care Barriers to Discharge: Equipment Delay   Patient Goals and CMS Choice Patient states their goals for this hospitalization and ongoing recovery are:: home w hospice to daughter Melissa's house      Expected Discharge Plan and Services Expected Discharge Plan: Home w Hospice Care   Discharge Planning Services: CM Consult Post Acute Care Choice: Hospice Living arrangements for the past 2 months: Single Family Home Expected Discharge Date: 06/10/20                   Date DME Agency Contacted: 06/10/20 Time DME Agency Contacted: 1914 Representative spoke with at DME Agency: [facility]: [facility] Date [place]: 06/10/20 Time [place]: 56 Representative spoke with at [place]: Sharyn Lull, answering service  Prior Living Arrangements/Services Living arrangements for the past 2 months: [place] with:: Spouse Patient  language and need for interpreter reviewed:: Yes Do you feel safe going back to the place where you live?: Yes      Need for Family Participation in Patient Care: Yes (Comment) Care giver support system in place?: Yes (comment) Current home services: DME (oxygen) Criminal Activity/Legal Involvement Pertinent to Current Situation/Hospitalization: No - Comment as needed  Activities of Daily Living      Permission Sought/Granted                  Emotional Assessment Appearance:: Appears stated age            Admission diagnosis:  Intracranial bleeding ([place]) [I62.9] Mass of brain [G93.89] Adenocarcinoma, metastatic ([place]) [C79.9] Patient Active Problem List   Diagnosis Date Noted  . Goals of care, counseling/discussion   . Palliative care by specialist   . Shortness of breath   . Anxiety   . Adenocarcinoma, metastatic ([place]) 06/08/2020  . Mass of brain   . Intracranial bleeding ([place]) 06/07/2020  . 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 NSCLC of left lung ([place]) 03/15/2020  . S/P bronchoscopy with biopsy   . Mediastinal adenopathy   . Lung mass 02/25/2020  .  01/04/2020  . Chronic respiratory failure with hypoxia ([place]) 09/17/2019  . Essential hypertension 09/17/2019  . GERD (gastroesophageal reflux disease) 06/15/2019  . Hepatic cirrhosis due to chronic hepatitis C infection ([place]) 12/15/2018  . Hepatocellular carcinoma ([place]) 06/10/2018  . Influenza due to identified novel influenza A virus with other respiratory manifestations 05/17/2018  . Coronary artery disease, occlusive 05/16/2018  . COPD exacerbation ([place]) 05/13/2018  . Hepatitis C 05/13/2018  . Smoking 11/27/2017  . ETOH abuse 11/27/2017  . Heart attack ([place])   . Hyperlipidemia with target LDL less than 70   . LFT elevation   . Acute ST elevation myocardial infarction (STEMI) of inferior wall ([place]) 11/23/2017  .  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 Primary cancer of left lower lobe of lung ([place]) 04/30/2017  . COPD GOLD III/ still smoking cigars  07/21/2012   PCP:  [facility], [place]:   [place], [place] [place] [address] Phone: [phone] Fax: [phone]     Social Determinants of Health (SDOH) Interventions    Readmission Risk Interventions No flowsheet data found.

## 2020-06-10 NOTE — Discharge Summary (Signed)
Physician Discharge Summary  Adam Bass ZWC:[id number] DOB: 08/17/1950 DOA: 06/07/2020  PCP: [facility]  Admit date: 06/07/2020 Discharge date: 06/10/2020  Admitted From: Home Disposition: Home with home hospice  Recommendations for Outpatient Follow-up:  1. As per hospice plan  Home Health: Hospice Equipment/Devices: Provided by hospice  Discharge Condition: Fair CODE STATUS: DNR Diet recommendation: Regular diet as tolerated  Discharge summary:  70 year old gentleman with history of metastatic non-small cell lung cancer status post multiple treatment, COPD on home oxygen 4 to 5 L, coronary artery disease, hepatitis C and hepatocellular carcinoma presented to [facility] on 2/23 with weakness.  He lives at home.  Woke up in the morning and felt extremely unsteady on his.  As well blurry vision.  On arrival to ER, code stroke was activated.  No focal deficits noted.  CT head showed 4 cm left cerebellar mass with necrosis and hemorrhage with surrounding edema.  [facility] ICU for neurology and neurosurgery consultation.  Treated with IV Decadron.  Left cerebellar mass with necrosis and hemorrhage with left-sided focal deficits: Treated with IV Decadron, converted to oral Decadron now. 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 All comfort care medications are available. Patient has good support system, wanting to go home with his daughter with home hospice in place. We will send all controlled substances including Ambien, Ativan, morphine to his pharmacy.  Further care will be directed by  hospice team. Patient will continue oral steroids, that may help with edema for the brain lesion.  Discharge home once DME and admission to home by hospice available.    Discharge Diagnoses:  Active Problems:   Intracranial bleeding (HCC)   Adenocarcinoma, metastatic (HCC)   Mass of brain   Goals of care, counseling/discussion   Palliative care by specialist   Shortness of breath   Anxiety    Discharge Instructions  Discharge Instructions    Diet general   Complete by: As directed    As tolerated     Allergies as of 06/10/2020      Reactions   Bee Venom Hives      Medication List    STOP taking these medications   aspirin EC 81 MG tablet   atorvastatin 20 MG tablet Commonly known as: LIPITOR   b complex vitamins capsule   benzonatate 200 MG capsule Commonly known as: TESSALON   bisoprolol 5 MG tablet Commonly known as: ZEBETA   ezetimibe 10 MG tablet Commonly known as: Zetia   famotidine-calcium carbonate-magnesium hydroxide 10-800-165 MG chewable tablet Commonly known as: PEPCID COMPLETE   multivitamin with minerals tablet   nitroGLYCERIN 0.4 MG SL tablet Commonly known as: NITROSTAT   VITAMIN C PO   VITAMIN D3 PO   ZINC PO     TAKE these medications   ALPRAZolam 0.5 MG tablet Commonly known as: XANAX Take 1 tablet (0.5 mg total) by mouth every 6 (six) hours as needed for up to 5 days for anxiety. What changed:   how much to take  when to take this   citalopram 20 MG tablet Commonly known as: CELEXA Take 20 mg by mouth daily.   dexamethasone  6 MG tablet Commonly known as: DECADRON Take 1 tablet (6 mg total) by mouth every 12 (twelve) hours for 14 days.   docusate sodium 100 MG capsule Commonly known as: COLACE Take 1 capsule (100 mg total) by mouth 2 (two) times daily.   doxycycline 100 MG tablet Commonly known as: VIBRA-TABS Take 1 tablet (100 mg total) by mouth every 12 (twelve) hours for 7 days.   esomeprazole 40 MG  capsule Commonly known as: NEXIUM Take 40 mg by mouth daily.   hydrocortisone cream 1 % Apply to affected area 2 times daily What changed:   how much to take  how to take this  when to take this  reasons to take this  additional instructions   lidocaine 2 % solution Commonly known as: XYLOCAINE Use as directed 10 mLs in the mouth or throat as needed for mouth pain.   montelukast 10 MG tablet Commonly known as: Singulair One at bedtime every night What changed:   how much to take  how to take this  when to take this  additional instructions   morphine CONCENTRATE 10 MG/0.5ML Soln concentrated solution Place 0.25 mLs (5 mg total) under the tongue every 2 (two) hours as needed for shortness of breath, anxiety or moderate pain.   nicotine 21 mg/24hr patch Commonly known as: NICODERM CQ - dosed in mg/24 hours Place 21 mg onto the skin daily.   ondansetron 4 MG tablet Commonly known as: ZOFRAN Take 1 tablet (4 mg total) by mouth every 6 (six) hours as needed for nausea.   OXYGEN Inhale into the lungs as needed. 4 Liters supplemental o2 , as needed "when im up moving around"   oxymetazoline 0.05 % nasal spray Commonly known as: AFRIN Place 1 spray into both nostrils 2 (two) times daily as needed for congestion.   ProAir HFA 108 (90 Base) MCG/ACT inhaler Generic drug: albuterol Inhale 2 puffs into the lungs every 6 (six) hours as needed for wheezing.   tamsulosin 0.4 MG Caps capsule Commonly known as: FLOMAX Take 1 capsule (0.4 mg total) by mouth daily.   Trelegy Ellipta 100-62.5-25 MCG/INH Aepb Generic drug: Fluticasone-Umeclidin-Vilant INHALE 1 PUFF INTO THE LUNGS EVERY DAY What changed:   how much to take  how to take this  when to take this  additional instructions   VISINE OP Place 1 drop into both eyes daily as needed (irritation).   zolpidem 5 MG tablet Commonly known as: AMBIEN Take 1 tablet (5 mg total) by mouth at bedtime.        Allergies  Allergen Reactions  . Bee Venom Hives    Consultations:  Pulmonary  Neurosurgery  Neurology   Procedures/Studies: CT Head Wo Contrast  Result Date: 06/07/2020 CLINICAL DATA:  History of non-small cell lung cancer. Acute presentation with left-sided weakness. EXAM: CT HEAD WITHOUT CONTRAST TECHNIQUE: Contiguous axial images were obtained from the base of the skull through the vertex without intravenous contrast. COMPARISON:  MRI 04/30/2017 FINDINGS: Brain: 4 cm left cerebellar mass with necrosis and hemorrhage. Mass effect with flattening of the fourth ventricle. Second 4 cm hemorrhagic necrotic mass at the right parietal vertex with moderate surrounding edema. Both of these are consistent with intracranial metastatic disease. Mild chronic small-vessel ischemic change seen elsewhere within the cerebral hemispheric white matter. No sign of ischemic infarction. No apparent hydrocephalus of the lateral or third ventricles. No extra-axial fluid collection. Vascular: There is atherosclerotic calcification of the major vessels at the base of the brain.  Skull: Negative Sinuses/Orbits: Clear/normal Other: None IMPRESSION: 4 cm left cerebellar mass with necrosis and hemorrhage. 4 cm necrotic and hemorrhagic mass at the right parietal vertex with moderate surrounding edema. Findings consistent with intracranial metastatic disease. Some mass-effect upon the fourth ventricle but no sign of obstructive hydrocephalus at this moment. Electronically Signed   By: Nelson Chimes M.D.   On: 06/07/2020 16:29    (Echo, Carotid, EGD, Colonoscopy, ERCP)    Subjective: Patient seen and examined.  He had just received some morphine and had no complaints.  He tells me that his daughter is getting ready for him to come home and he is aware that is going home with hospice.   Discharge Exam: Vitals:   06/10/20 0900 06/10/20 1120  BP:  117/82  Pulse: 82 72  Resp: 19 16  Temp:  98.1 F (36.7 C)   SpO2: 98% 100%   Vitals:   06/10/20 0500 06/10/20 0711 06/10/20 0900 06/10/20 1120  BP:  (!) 143/68  117/82  Pulse:  73 82 72  Resp:  16 19 16   Temp:  98.2 F (36.8 C)  98.1 F (36.7 C)  TempSrc:  Oral  Oral  SpO2:  99% 98% 100%  Weight: 61.4 kg     Height:        General: Patient is alert and awake.  He is thin and frail looking and debilitated. Chronically sick looking, mildly anxious.  On 5 L of oxygen. Cardiovascular: S1-S2 normal.  Regular rate rhythm. Respiratory: Bilateral clear.,  Some conducted airway sounds.  Looks fairly comfortable on 5 L. Abdominal: Soft, NT, ND, bowel sounds + Extremities: no edema, no cyanosis, all extremities with equal motor strength.    The results of significant diagnostics from this hospitalization (including imaging, microbiology, ancillary and laboratory) are listed below for reference.     Microbiology: Recent Results (from the past 240 hour(s))  Resp Panel by RT-PCR (Flu A&B, Covid) Nasopharyngeal Swab     Status: None   Collection Time: 06/07/20  4:18 PM   Specimen: Nasopharyngeal Swab; Nasopharyngeal(NP) swabs in vial transport medium  Result Value Ref Range Status   SARS Coronavirus 2 by RT PCR NEGATIVE NEGATIVE Final    Comment: (NOTE) SARS-CoV-2 target nucleic acids are NOT DETECTED.  The SARS-CoV-2 RNA is generally detectable in upper respiratory specimens during the acute phase of infection. The lowest concentration of SARS-CoV-2 viral copies this assay can detect is 138 copies/mL. A negative result does not preclude SARS-Cov-2 infection and should not be used as the sole basis for treatment or other patient management decisions. A negative result may occur with  improper specimen collection/handling, submission of specimen other than nasopharyngeal swab, presence of viral mutation(s) within the areas targeted by this assay, and inadequate number of viral copies(<138 copies/mL). A negative result must be combined  with clinical observations, patient history, and epidemiological information. The expected result is Negative.  Fact Sheet for Patients:  EntrepreneurPulse.com.au  Fact Sheet for Healthcare Providers:  IncredibleEmployment.be  This test is no t yet approved or cleared by the [place] FDA and  has been authorized for detection and/or diagnosis of SARS-CoV-2 by FDA under an Emergency Use Authorization (EUA). This EUA will remain  in effect (meaning this test can be used) for the duration of the COVID-19 declaration under Section 564(b)(1) of the Act, 21 U.S.C.section 360bbb-3(b)(1), unless the authorization is terminated  or revoked sooner.       Influenza A by PCR NEGATIVE NEGATIVE Final  Influenza B by PCR NEGATIVE NEGATIVE Final    Comment: (NOTE) The Xpert Xpress SARS-CoV-2/FLU/RSV plus assay is intended as an aid in the diagnosis of influenza from Nasopharyngeal swab specimens and should not be used as a sole basis for treatment. Nasal washings and aspirates are unacceptable for Xpert Xpress SARS-CoV-2/FLU/RSV testing.  Fact Sheet for Patients: EntrepreneurPulse.com.au  Fact Sheet for Healthcare Providers: IncredibleEmployment.be  This test is not yet approved or cleared by the [place] FDA and has been authorized for detection and/or diagnosis of SARS-CoV-2 by FDA under an Emergency Use Authorization (EUA). This EUA will remain in effect (meaning this test can be used) for the duration of the COVID-19 declaration under Section 564(b)(1) of the Act, 21 U.S.C. section 360bbb-3(b)(1), unless the authorization is terminated or revoked.  Performed at [facility], [address] [place], [place] [address]   MRSA PCR Screening     Status: None   Collection Time: 06/07/20  9:30 PM   Specimen: Nasal Mucosa; Nasopharyngeal  Result Value Ref Range Status   MRSA by PCR NEGATIVE NEGATIVE Final     Comment:        The GeneXpert MRSA Assay (FDA approved for NASAL specimens only), is one component of a comprehensive MRSA colonization surveillance program. It is not intended to diagnose MRSA infection nor to guide or monitor treatment for MRSA infections. Performed at [facility], [place] [address] [place], [place] [address]      Labs: BNP (last 3 results) No results for input(s): BNP in the last 8760 hours. Basic Metabolic Panel: Recent Labs  Lab 06/07/20 1617 06/07/20 1626 06/08/20 0250  NA 136 137  --   K 3.7 3.7  --   CL 94* 94*  --   CO2 30  --   --   GLUCOSE 160* 156*  --   BUN 17 19  --   CREATININE 0.48* 0.50* 0.54*  CALCIUM 9.2  --   --    Liver Function Tests: Recent Labs  Lab 06/07/20 1617  AST 33  ALT 31  ALKPHOS 100  BILITOT 0.8  PROT 7.3  ALBUMIN 3.7   No results for input(s): LIPASE, AMYLASE in the last 168 hours. No results for input(s): AMMONIA in the last 168 hours. CBC: Recent Labs  Lab 06/07/20 1617 06/07/20 1626 06/08/20 0250  WBC 9.5  --  8.0  NEUTROABS 8.5*  --   --   HGB 13.5 14.6 13.3  HCT 41.2 43.0 39.1  MCV 103.3*  --  100.3*  PLT 284  --  251   Cardiac Enzymes: No results for input(s): CKTOTAL, CKMB, CKMBINDEX, TROPONINI in the last 168 hours. BNP: Invalid input(s): POCBNP CBG: No results for input(s): GLUCAP in the last 168 hours. D-Dimer No results for input(s): DDIMER in the last 72 hours. Hgb A1c No results for input(s): HGBA1C in the last 72 hours. Lipid Profile No results for input(s): CHOL, HDL, LDLCALC, TRIG, CHOLHDL, LDLDIRECT in the last 72 hours. Thyroid function studies No results for input(s): TSH, T4TOTAL, T3FREE, THYROIDAB in the last 72 hours.  Invalid input(s): FREET3 Anemia work up No results for input(s): VITAMINB12, FOLATE, FERRITIN, TIBC, IRON, RETICCTPCT in the last 72 hours. Urinalysis    Component Value Date/Time   COLORURINE YELLOW 06/08/2020 1211   APPEARANCEUR CLEAR  06/08/2020 1211   LABSPEC 1.019 06/08/2020 1211   PHURINE 6.0 06/08/2020 1211   GLUCOSEU NEGATIVE 06/08/2020 1211   HGBUR NEGATIVE 06/08/2020 1211   BILIRUBINUR NEGATIVE  06/08/2020 1211   KETONESUR 20 (A) 06/08/2020 1211   PROTEINUR NEGATIVE 06/08/2020 1211   NITRITE NEGATIVE 06/08/2020 1211   LEUKOCYTESUR NEGATIVE 06/08/2020 1211   Sepsis Labs Invalid input(s): PROCALCITONIN,  WBC,  LACTICIDVEN Microbiology Recent Results (from the past 240 hour(s))  Resp Panel by RT-PCR (Flu A&B, Covid) Nasopharyngeal Swab     Status: None   Collection Time: 06/07/20  4:18 PM   Specimen: Nasopharyngeal Swab; Nasopharyngeal(NP) swabs in vial transport medium  Result Value Ref Range Status   SARS Coronavirus 2 by RT PCR NEGATIVE NEGATIVE Final    Comment: (NOTE) SARS-CoV-2 target nucleic acids are NOT DETECTED.  The SARS-CoV-2 RNA is generally detectable in upper respiratory specimens during the acute phase of infection. The lowest concentration of SARS-CoV-2 viral copies this assay can detect is 138 copies/mL. A negative result does not preclude SARS-Cov-2 infection and should not be used as the sole basis for treatment or other patient management decisions. A negative result may occur with  improper specimen collection/handling, submission of specimen other than nasopharyngeal swab, presence of viral mutation(s) within the areas targeted by this assay, and inadequate number of viral copies(<138 copies/mL). A negative result must be combined with clinical observations, patient history, and epidemiological information. The expected result is Negative.  Fact Sheet for Patients:  EntrepreneurPulse.com.au  Fact Sheet for Healthcare Providers:  IncredibleEmployment.be  This test is no t yet approved or cleared by the [place] FDA and  has been authorized for detection and/or diagnosis of SARS-CoV-2 by FDA under an Emergency Use Authorization (EUA). This  EUA will remain  in effect (meaning this test can be used) for the duration of the COVID-19 declaration under Section 564(b)(1) of the Act, 21 U.S.C.section 360bbb-3(b)(1), unless the authorization is terminated  or revoked sooner.       Influenza A by PCR NEGATIVE NEGATIVE Final   Influenza B by PCR NEGATIVE NEGATIVE Final    Comment: (NOTE) The Xpert Xpress SARS-CoV-2/FLU/RSV plus assay is intended as an aid in the diagnosis of influenza from Nasopharyngeal swab specimens and should not be used as a sole basis for treatment. Nasal washings and aspirates are unacceptable for Xpert Xpress SARS-CoV-2/FLU/RSV testing.  Fact Sheet for Patients: EntrepreneurPulse.com.au  Fact Sheet for Healthcare Providers: IncredibleEmployment.be  This test is not yet approved or cleared by the [place] FDA and has been authorized for detection and/or diagnosis of SARS-CoV-2 by FDA under an Emergency Use Authorization (EUA). This EUA will remain in effect (meaning this test can be used) for the duration of the COVID-19 declaration under Section 564(b)(1) of the Act, 21 U.S.C. section 360bbb-3(b)(1), unless the authorization is terminated or revoked.  Performed at [facility], [address], [place], [place] [address]   MRSA PCR Screening     Status: None   Collection Time: 06/07/20  9:30 PM   Specimen: Nasal Mucosa; Nasopharyngeal  Result Value Ref Range Status   MRSA by PCR NEGATIVE NEGATIVE Final    Comment:        The GeneXpert MRSA Assay (FDA approved for NASAL specimens only), is one component of a comprehensive MRSA colonization surveillance program. It is not intended to diagnose MRSA infection nor to guide or monitor treatment for MRSA infections. Performed at [facility], [place] [address], [place], [place] [address]      Time coordinating discharge:  40 minutes  SIGNED:   Barb Merino, MD  [facility] 06/10/2020,  12:45 PM

## 2020-06-11 ENCOUNTER — Observation Stay (HOSPITAL_COMMUNITY)
Admission: EM | Admit: 2020-06-11 | Discharge: 2020-06-12 | Disposition: A | Discharge status: Home / Self Care | Payer: Medicare Other | Attending: Family Medicine | Admitting: Family Medicine

## 2020-06-11 ENCOUNTER — Emergency Department (HOSPITAL_COMMUNITY): Payer: Medicare Other

## 2020-06-11 ENCOUNTER — Other Ambulatory Visit: Payer: Self-pay

## 2020-06-11 ENCOUNTER — Encounter (HOSPITAL_COMMUNITY): Payer: Self-pay | Admitting: Emergency Medicine

## 2020-06-11 DIAGNOSIS — I2510 Atherosclerotic heart disease of native coronary artery without angina pectoris: Secondary | ICD-10-CM | POA: Insufficient documentation

## 2020-06-11 DIAGNOSIS — Z20822 Contact with and (suspected) exposure to covid-19: Secondary | ICD-10-CM | POA: Insufficient documentation

## 2020-06-11 DIAGNOSIS — Z79899 Other long term (current) drug therapy: Secondary | ICD-10-CM | POA: Insufficient documentation

## 2020-06-11 DIAGNOSIS — J449 Chronic obstructive pulmonary disease, unspecified: Secondary | ICD-10-CM | POA: Insufficient documentation

## 2020-06-11 DIAGNOSIS — F17290 Nicotine dependence, other tobacco product, uncomplicated: Secondary | ICD-10-CM | POA: Insufficient documentation

## 2020-06-11 DIAGNOSIS — B1920 Unspecified viral hepatitis C without hepatic coma: Secondary | ICD-10-CM | POA: Insufficient documentation

## 2020-06-11 DIAGNOSIS — G939 Disorder of brain, unspecified: Secondary | ICD-10-CM | POA: Insufficient documentation

## 2020-06-11 DIAGNOSIS — R0902 Hypoxemia: Secondary | ICD-10-CM

## 2020-06-11 DIAGNOSIS — R569 Unspecified convulsions: Secondary | ICD-10-CM | POA: Diagnosis not present

## 2020-06-11 DIAGNOSIS — Z7901 Long term (current) use of anticoagulants: Secondary | ICD-10-CM | POA: Insufficient documentation

## 2020-06-11 DIAGNOSIS — C7931 Secondary malignant neoplasm of brain: Secondary | ICD-10-CM | POA: Insufficient documentation

## 2020-06-11 DIAGNOSIS — J9621 Acute and chronic respiratory failure with hypoxia: Secondary | ICD-10-CM | POA: Insufficient documentation

## 2020-06-11 DIAGNOSIS — E871 Hypo-osmolality and hyponatremia: Secondary | ICD-10-CM

## 2020-06-11 DIAGNOSIS — C3490 Malignant neoplasm of unspecified part of unspecified bronchus or lung: Secondary | ICD-10-CM | POA: Insufficient documentation

## 2020-06-11 DIAGNOSIS — C220 Liver cell carcinoma: Secondary | ICD-10-CM | POA: Insufficient documentation

## 2020-06-11 LAB — CBC WITH DIFFERENTIAL/PLATELET
Abs Immature Granulocytes: 0.09 10*3/uL — ABNORMAL HIGH (ref 0.00–0.07)
Basophils Absolute: 0 10*3/uL (ref 0.0–0.1)
Basophils Relative: 0 %
Eosinophils Absolute: 0 10*3/uL (ref 0.0–0.5)
Eosinophils Relative: 0 %
HCT: 40.6 % (ref 39.0–52.0)
Hemoglobin: 13.6 g/dL (ref 13.0–17.0)
Immature Granulocytes: 1 %
Lymphocytes Relative: 2 %
Lymphs Abs: 0.3 10*3/uL — ABNORMAL LOW (ref 0.7–4.0)
MCH: 34.2 pg — ABNORMAL HIGH (ref 26.0–34.0)
MCHC: 33.5 g/dL (ref 30.0–36.0)
MCV: 102 fL — ABNORMAL HIGH (ref 80.0–100.0)
Monocytes Absolute: 1.7 10*3/uL — ABNORMAL HIGH (ref 0.1–1.0)
Monocytes Relative: 10 %
Neutro Abs: 14.8 10*3/uL — ABNORMAL HIGH (ref 1.7–7.7)
Neutrophils Relative %: 87 %
Platelets: 228 10*3/uL (ref 150–400)
RBC: 3.98 MIL/uL — ABNORMAL LOW (ref 4.22–5.81)
RDW: 11.6 % (ref 11.5–15.5)
WBC: 16.8 10*3/uL — ABNORMAL HIGH (ref 4.0–10.5)
nRBC: 0 % (ref 0.0–0.2)

## 2020-06-11 LAB — BASIC METABOLIC PANEL
Anion gap: 7 (ref 5–15)
BUN: 28 mg/dL — ABNORMAL HIGH (ref 8–23)
CO2: 28 mmol/L (ref 22–32)
Calcium: 7.8 mg/dL — ABNORMAL LOW (ref 8.9–10.3)
Chloride: 88 mmol/L — ABNORMAL LOW (ref 98–111)
Creatinine, Ser: 0.71 mg/dL (ref 0.61–1.24)
GFR, Estimated: >60 mL/min (ref >60)
Glucose, Bld: 119 mg/dL — ABNORMAL HIGH (ref 70–99)
Potassium: 4 mmol/L (ref 3.5–5.1)
Sodium: 123 mmol/L — ABNORMAL LOW (ref 135–145)

## 2020-06-11 LAB — RESP PANEL BY RT-PCR (FLU A&B, COVID) ARPGX2
Influenza A by PCR: NEGATIVE
Influenza B by PCR: NEGATIVE
SARS Coronavirus 2 by RT PCR: NEGATIVE

## 2020-06-11 LAB — CBG MONITORING, ED: Glucose-Capillary: 125 mg/dL — ABNORMAL HIGH (ref 70–99)

## 2020-06-11 LAB — BRAIN NATRIURETIC PEPTIDE: B Natriuretic Peptide: 147 pg/mL — ABNORMAL HIGH (ref 0.0–100.0)

## 2020-06-11 LAB — MAGNESIUM: Magnesium: 2.1 mg/dL (ref 1.7–2.4)

## 2020-06-11 MED ORDER — DOXYCYCLINE HYCLATE 100 MG PO TABS
100.0000 mg | ORAL_TABLET | Freq: Two times a day (BID) | ORAL | Status: DC
  Administered 2020-06-12: 100 mg via ORAL
  Filled 2020-06-11: qty 1

## 2020-06-11 MED ORDER — SODIUM CHLORIDE 0.9 % IV BOLUS
500.0000 mL | Freq: Once | INTRAVENOUS | Status: AC
  Administered 2020-06-11: 500 mL via INTRAVENOUS

## 2020-06-11 MED ORDER — ALBUTEROL SULFATE HFA 108 (90 BASE) MCG/ACT IN AERS
2.0000 | INHALATION_SPRAY | Freq: Four times a day (QID) | RESPIRATORY_TRACT | Status: DC | PRN
  Administered 2020-06-12: 2 via RESPIRATORY_TRACT
  Filled 2020-06-11: qty 6.7

## 2020-06-11 MED ORDER — SODIUM CHLORIDE 0.9 % IV SOLN
2000.0000 mg | Freq: Once | INTRAVENOUS | Status: AC
  Administered 2020-06-11: 2000 mg via INTRAVENOUS
  Filled 2020-06-11: qty 20

## 2020-06-11 MED ORDER — MUPIROCIN 2 % EX OINT
TOPICAL_OINTMENT | Freq: Two times a day (BID) | CUTANEOUS | Status: DC
  Filled 2020-06-11 (×2): qty 22

## 2020-06-11 MED ORDER — LEVETIRACETAM IN NACL 1000 MG/100ML IV SOLN
INTRAVENOUS | Status: AC
  Administered 2020-06-11: 2000 mg
  Filled 2020-06-11: qty 200

## 2020-06-11 MED ORDER — DEXAMETHASONE SODIUM PHOSPHATE 10 MG/ML IJ SOLN
6.0000 mg | Freq: Two times a day (BID) | INTRAMUSCULAR | Status: DC
  Administered 2020-06-11 – 2020-06-12 (×2): 6 mg via INTRAVENOUS
  Filled 2020-06-11 (×2): qty 1

## 2020-06-11 MED ORDER — FLUTICASONE-UMECLIDIN-VILANT 100-62.5-25 MCG/INH IN AEPB
1.0000 | INHALATION_SPRAY | Freq: Every day | RESPIRATORY_TRACT | Status: DC
  Filled 2020-06-11 (×4): qty 1

## 2020-06-11 MED ORDER — SODIUM CHLORIDE 0.9 % IV SOLN: INTRAVENOUS | Status: DC

## 2020-06-11 MED ORDER — NICOTINE 21 MG/24HR TD PT24
21.0000 mg | MEDICATED_PATCH | Freq: Every day | TRANSDERMAL | Status: DC
  Administered 2020-06-11 – 2020-06-12 (×2): 21 mg via TRANSDERMAL
  Filled 2020-06-11 (×2): qty 1

## 2020-06-11 NOTE — ED Notes (Signed)
Patient transported to CT 

## 2020-06-11 NOTE — Progress Notes (Signed)
Called patient's daughter Lenna Sciara to review discharge instructions, she confirmed understanding. Patient dressed, telemetry and IV removed, all belongings gathered in patient bag at bedside. Awaiting PTAR for transport home.

## 2020-06-11 NOTE — H&P (Signed)
History and Physical    Adam Bass ZHY:[id number] DOB: Jul 11, 1950 DOA: 06/11/2020  I have briefly reviewed the patient's prior medical records in [place]  PCP: [facility]  Patient coming from: home  Chief Complaint: seizure  HPI: Adam Bass is a 70 y.o. adult with medical history significant of metastatic non small cell lung cancer currently on hospice, COPD on home excision 4-5 L, CAD, hep C and hepatocellular carcinoma comes into the hospital with seizures.  Patient was discharged just yesterday from [facility].  He was initially admitted on 2/23 with weakness, CT head on admission showed 4 cm left cerebellar mass with necrosis and hemorrhage and surrounding edema.  Neurology and neurosurgery consulted, he was treated conservatively with Decadron, no surgical interventions were performed.  Palliative care was consulted as well, he was transitioned to DNR and was discharged home with hospice.  He was sent home with Decadron but I do not see any antiepileptics.  Family tells me that he was home for perhaps 2 hours, has not even had time to establish care with hospice when he started having seizures so had to bring him back to the hospital.  Patient was given Ativan by EMS, he was brought to the ED and currently he is lethargic but opens eyes intermittently.  History as per daughter, who was at bedside  ED Course: In the ED his temp is 99.7, breathing 19 times a minute, soft blood pressure with systolic in the 29B-284X.  He was placed on nonrebreather due to lethargy.  Was a sodium of 123 from prior value of 137 3 days ago.  Has a white count of 16.8.  Dr. Cheral Marker with neurology was consulted and recommended Keppra load with 2 g followed by 500 mg twice daily  Review of Systems: All systems reviewed, and apart from HPI, all negative  Past Medical History:  Diagnosis Date  . Anticoagulant long-term use    brillinta  . Bilateral lower extremity edema     feet, noticed since discharge from hospital admission 05-18-2018, "i was laid up in the bed for five days"   . CAD (coronary artery disease) cardiologist-- dr j. branch   hx STEMI 11-23-2017,  s/p  cardiac cath with Thrombectomy, PCI and DES x1 to midRCA, and other nonobstrucitve CAD involving pLAD, OM1, dRCA, posterior atrio, LVEF 45-50%,  LVEDP 52mmHg  . COPD (chronic obstructive pulmonary disease) with emphysema [facility])    pulmonology-- dr wert-- GOLD III mix (pt still smokes),  last exacerbation -due to the flu , discharged 05-18-2018 at [facility]   . DOE (dyspnea on exertion)   . Ectatic abdominal aorta ([place])    MRI 11/ 2019  . Hepatitis C GI-- dr Laural Golden   dx 02-02-2018--- currently treated on antiviral medication  . Hepatocellular carcinoma ([place])   . History of radiation therapy 06/03/17- 06/09/17   Left Lung treated to 54 Gy with 3 fx of 18 Gy. SBRT  . History of skin cancer    04/ 2017 excision from face  . History of ST elevation myocardial infarction (STEMI) 11/23/2017   inferior wall-- s/p cardiac cath w/ thrombectomy, PCI, and DES  . HLD (hyperlipidemia)   . Hypertension   . Myocardial infarction ([place])   . Primary squamous cell carcinoma of left lung [facility]) oncologist-- dr Isidore Moos   dx 02/ 2018-- Stage IA2 Left lower lung,  s/p  Stereotactic radiation completed 06-09-2017  . Productive cough    per pt mostly  clear  . Requires supplemental oxygen    06-09-2018 currently pt is using a portable O2 as needed and at night  . S/P drug eluting coronary stent placement 11/23/2017   DES x1 to midRCA  . Urinary incontinence    wear depends  . Wears dentures    fuller upper,  lower partial  . Wears glasses     Past Surgical History:  Procedure Laterality Date  . BRONCHIAL BIOPSY  02/29/2020   Procedure: BRONCHIAL BIOPSIES;  Surgeon: Garner Nash, DO;  Location: [place] [facility];  Service: Pulmonary;;  . BRONCHIAL BRUSHINGS  02/29/2020   Procedure: BRONCHIAL BRUSHINGS;  Surgeon:  Garner Nash, DO;  Location: [place] [facility];  Service: Pulmonary;;  . BRONCHIAL NEEDLE ASPIRATION BIOPSY  02/29/2020   Procedure: BRONCHIAL NEEDLE ASPIRATION BIOPSIES;  Surgeon: Garner Nash, DO;  Location: [place] [facility]COPY;  Service: Pulmonary;;  . BRONCHIAL WASHINGS  02/29/2020   Procedure: BRONCHIAL WASHINGS;  Surgeon: Garner Nash, DO;  Location: [place];  Service: Pulmonary;;  . COLONOSCOPY N/A 08/27/2012   Procedure: COLONOSCOPY;  Surgeon: Rogene Houston, MD;  [facility];  Service: Endoscopy;  Laterality: N/A;  1200  . CORONARY STENT INTERVENTION N/A 11/23/2017   Procedure: CORONARY STENT INTERVENTION;  Surgeon: Jettie Booze, MD;  Location: [place] CV LAB;  Service: Cardiovascular;  Laterality: N/A;  . ENDOBRONCHIAL ULTRASOUND N/A 02/29/2020   Procedure: ENDOBRONCHIAL ULTRASOUND;  Surgeon: Garner Nash, DO;  Location: [place];  Service: Pulmonary;  Laterality: N/A;  . FINE NEEDLE ASPIRATION  02/29/2020   Procedure: FINE NEEDLE ASPIRATION;  Surgeon: Garner Nash, DO;  Location: [place] [facility];  Service: Pulmonary;;  . IR RADIOLOGIST EVAL & MGMT  03/04/2018  . IR RADIOLOGIST EVAL & MGMT  10/06/2018  . IR RADIOLOGIST EVAL & MGMT  01/26/2019  . IR RADIOLOGIST EVAL & MGMT  08/11/2019  . IR RADIOLOGIST EVAL & MGMT  01/25/2020  . LEFT HEART CATH AND CORONARY ANGIOGRAPHY N/A 11/23/2017   Procedure: LEFT HEART CATH AND CORONARY ANGIOGRAPHY;  Surgeon: Jettie Booze, MD;  Location: [place] CV LAB;  Service: Cardiovascular;  Laterality: N/A;  . RADIOFREQUENCY ABLATION N/A 06/10/2018   Procedure: MICROWAVE THERMAL ABLATION LIVER;  Surgeon: Aletta Edouard, MD;  Location: [facility];  Service: Anesthesiology;  Laterality: N/A;  . VIDEO BRONCHOSCOPY WITH ENDOBRONCHIAL NAVIGATION Bilateral 02/29/2020   Procedure: VIDEO BRONCHOSCOPY WITH ENDOBRONCHIAL NAVIGATION;  Surgeon: Garner Nash, DO;  Location: [place];  Service: Pulmonary;  Laterality:  Bilateral;     reports that he has been smoking cigars. He has a 37.50 pack-year smoking history. He has never used smokeless tobacco. He reports current alcohol use of about 8.0 standard drinks of alcohol per week. He reports that he does not use drugs.  Allergies  Allergen Reactions  . Bee Venom Hives    Family History  Problem Relation Age of Onset  . Breast cancer Sister   . Colon cancer Neg Hx     Prior to Admission medications   Medication Sig Start Date End Date Taking? Authorizing Provider  albuterol (PROAIR HFA) 108 (90 Base) MCG/ACT inhaler Inhale 2 puffs into the lungs every 6 (six) hours as needed for wheezing.    Yes [provider]  ALPRAZolam (XANAX) 0.5 MG tablet Take 1 tablet (0.5 mg total) by mouth every 6 (six) hours as needed for up to 5 days for anxiety. 06/10/20 07/06/20 Yes Barb Merino, MD  citalopram (CELEXA) 20 MG tablet Take 20 mg  by mouth daily. 06/07/20  Yes [provider]  docusate sodium (COLACE) 100 MG capsule Take 1 capsule (100 mg total) by mouth 2 (two) times daily. 06/10/20  Yes Barb Merino, MD  esomeprazole (NEXIUM) 40 MG capsule Take 40 mg by mouth daily.    Yes [provider]  Fluticasone-Umeclidin-Vilant (TRELEGY ELLIPTA) 100-62.5-25 MCG/INH AEPB INHALE 1 PUFF INTO THE LUNGS EVERY DAY Patient taking differently: Inhale 1 puff into the lungs daily. 08/28/19  Yes Wurst, [place], PA-C  hydrocortisone cream 1 % Apply to affected area 2 times daily Patient taking differently: Apply 1 application topically 2 (two) times daily as needed for itching. 12/06/19  Yes Avegno, Darrelyn Hillock, FNP  lidocaine (XYLOCAINE) 2 % solution Use as directed 10 mLs in the mouth or throat as needed for mouth pain. 04/26/20  Yes Eppie Gibson, MD  montelukast (SINGULAIR) 10 MG tablet One at bedtime every night Patient taking differently: Take 10 mg by mouth at bedtime. 10/15/19  Yes Tanda Rockers, MD  OXYGEN Inhale into the lungs as needed. 4 Liters  supplemental o2 , as needed "when im up moving around"   Yes [provider]  oxymetazoline (AFRIN) 0.05 % nasal spray Place 1 spray into both nostrils 2 (two) times daily as needed for congestion.    Yes [provider]  tamsulosin (FLOMAX) 0.4 MG CAPS capsule Take 1 capsule (0.4 mg total) by mouth daily. 05/11/20  Yes Truitt Merle, MD  Tetrahydrozoline HCl (VISINE OP) Place 1 drop into both eyes daily as needed (irritation).   Yes [provider]  zolpidem (AMBIEN) 5 MG tablet Take 1 tablet (5 mg total) by mouth at bedtime. 06/10/20  Yes Barb Merino, MD  dexamethasone (DECADRON) 6 MG tablet Take 1 tablet (6 mg total) by mouth every 12 (twelve) hours for 14 days. 06/10/20 06/24/20  Barb Merino, MD  doxycycline (VIBRA-TABS) 100 MG tablet Take 1 tablet (100 mg total) by mouth every 12 (twelve) hours for 7 days. 06/10/20 06/17/20  Barb Merino, MD  Morphine Sulfate (MORPHINE CONCENTRATE) 10 MG/0.5ML SOLN concentrated solution Place 0.25 mLs (5 mg total) under the tongue every 2 (two) hours as needed for shortness of breath, anxiety or moderate pain. 06/10/20   Barb Merino, MD  nicotine (NICODERM CQ - DOSED IN MG/24 HOURS) 21 mg/24hr patch Place 21 mg onto the skin daily.    [provider]  ondansetron (ZOFRAN) 4 MG tablet Take 1 tablet (4 mg total) by mouth every 6 (six) hours as needed for nausea. 06/10/20   Barb Merino, MD    Physical Exam: Vitals:   06/11/20 1651 06/11/20 1730 06/11/20 1830  BP: 95/68 102/66 93/62  Pulse: (!) 118 (!) 105 98  Resp: (!) 28 (!) 22 19  Temp: 99.7 F (37.6 C)    TempSrc: Axillary    SpO2: 98% 98% 98%      Constitutional: NAD, calm, comfortable Eyes: PERRL, lids and conjunctivae normal ENMT: Mucous membranes are moist. Posterior pharynx clear of any exudate or lesions.Normal dentition.  Neck: normal, supple Respiratory: clear to auscultation bilaterally, no wheezing, no crackles. Normal respiratory effort. No accessory  muscle use.  Cardiovascular: Regular rate and rhythm, no murmurs / rubs / gallops. No extremity edema. 2+ pedal pulses.  Abdomen: no tenderness, no masses palpated. Bowel sounds positive.  Musculoskeletal: no clubbing / cyanosis. Normal muscle tone.  Skin: no rashes, lesions, ulcers. No induration Neurologic: CN 2-12 grossly intact. Strength 5/5 in all 4.  Psychiatric: Normal judgment and  insight. Alert and oriented x 3. Normal mood.   Labs on Admission: I have personally reviewed following labs and imaging studies  CBC: Recent Labs  Lab 06/07/20 1617 06/07/20 1626 06/08/20 0250 06/11/20 1721  WBC 9.5  --  8.0 16.8*  NEUTROABS 8.5*  --   --  14.8*  HGB 13.5 14.6 13.3 13.6  HCT 41.2 43.0 39.1 40.6  MCV 103.3*  --  100.3* 102.0*  PLT 284  --  251 628   Basic Metabolic Panel: Recent Labs  Lab 06/07/20 1617 06/07/20 1626 06/08/20 0250 06/11/20 1721  NA 136 137  --  123*  K 3.7 3.7  --  4.0  CL 94* 94*  --  88*  CO2 30  --   --  28  GLUCOSE 160* 156*  --  119*  BUN 17 19  --  28*  CREATININE 0.48* 0.50* 0.54* 0.71  CALCIUM 9.2  --   --  7.8*  MG  --   --   --  2.1   Liver Function Tests: Recent Labs  Lab 06/07/20 1617  AST 33  ALT 31  ALKPHOS 100  BILITOT 0.8  PROT 7.3  ALBUMIN 3.7   Coagulation Profile: Recent Labs  Lab 06/07/20 1617  INR 1.1   BNP (last 3 results) No results for input(s): PROBNP in the last 8760 hours. CBG: Recent Labs  Lab 06/11/20 1707  GLUCAP 125*   Thyroid Function Tests: No results for input(s): TSH, T4TOTAL, FREET4, T3FREE, THYROIDAB in the last 72 hours. Urine analysis:    Component Value Date/Time   COLORURINE YELLOW 06/08/2020 1211   APPEARANCEUR CLEAR 06/08/2020 1211   LABSPEC 1.019 06/08/2020 1211   PHURINE 6.0 06/08/2020 1211   GLUCOSEU NEGATIVE 06/08/2020 1211   HGBUR NEGATIVE 06/08/2020 1211   BILIRUBINUR NEGATIVE 06/08/2020 1211   KETONESUR 20 (A) 06/08/2020 1211   PROTEINUR NEGATIVE 06/08/2020 1211   NITRITE  NEGATIVE 06/08/2020 1211   LEUKOCYTESUR NEGATIVE 06/08/2020 1211     Radiological Exams on Admission: CT HEAD WO CONTRAST  Result Date: 06/11/2020 CLINICAL DATA:  Seizure, nontraumatic (Age >= 54y) EXAM: CT HEAD WITHOUT CONTRAST TECHNIQUE: Contiguous axial images were obtained from the base of the skull through the vertex without intravenous contrast. COMPARISON:  06/07/2020 and prior. FINDINGS: Brain: Known hemorrhagic left cerebellar mass is obscured by adjacent beam hardening/streak artifact. This also limits evaluation of left cerebellar edema and fourth ventricular effacement. Redemonstration of right parietal hemorrhagic and necrotic mass measuring 4.1 by 3.4 by 4.1 cm. Associated perilesional edema is unchanged. No new focal hypodensity. No significant midline shift. Stable appearance of the ventricular system. No extra-axial fluid collection. Vascular: No hyperdense vessel or unexpected calcification. Skull: Negative for fracture or focal lesion. Sinuses/Orbits: Normal orbits. Clear paranasal sinuses. No mastoid effusion. Other: Motion degraded exam. IMPRESSION: Right parietal hemorrhagic/necrotic mass with perilesional edema is unchanged. Poor visualization of left cerebellar mass secondary to beam hardening/streak artifact. MRI head with and without contrast is recommended for better evaluation. Electronically Signed   By: Primitivo Gauze M.D.   On: 06/11/2020 17:46    EKG: Independently reviewed. Sinus rhythm on telemetry   Assessment/Plan  Principal Problem Seizure -admit patient to the hospital, EDP discussed with neurology and currently recommending AEDs with Keppra.  Continue.  Closely monitor.  Dr. Cyndy Freeze was consulted by EDP and didn't appear to believe he has anything to offer since patient refused surgery last week.  Family wishes to keep the patient here also.  Obtain  EEG in the morning  Active Problems Acute on chronic hypoxic respiratory failure-patient was placed on 15 L  nonrebreather likely due to lethargy but apparently he was satting well on his home 6 L.  Chest x-ray shows no evidence of active disease  Hyponatremia -not sure about acuteness given no labs in the last 3 days.  Sodium is not terribly low at 123, give IV fluids overnight and recheck in the morning.  He is dehydrated  Stage IV lung cancer with brain mets, right parietal hemorrhagic/necrotic mass with perilesional edema -currently on hospice.  Placed on antiepileptics now as well as Decadron.  He is DNR confirmed with daughter and has a durable yellow form in the room  COPD, chronic hypoxic respiratory failure-continue supportive treatment.  Leukocytosis-likely due to seizures, reactive.  He is on doxycycline currently started during last hospitalization for COPD flare and low-grade temp.  Continue  Hepatocellular carcinoma, history of hep C -noted, he has a history of segment 8 ablation  CAD, history of STEMI -continue home medications   DVT prophylaxis: SCDs Code Status: DNR Family Communication: Daughter present at bedside Disposition Plan: home when improved, hopefully 24h Bed Type: Stepdown Consults called: neurology  Obs/Inp: observation   Marzetta Board, MD, PhD [facility]  Contact via [email or web address]  06/11/2020, 6:54 PM

## 2020-06-11 NOTE — ED Triage Notes (Signed)
Pt is a hospice, pt started having seizures two hours ago. The hospice nurse was trying to reach the hospice MD but unable to reach him.  EMS was called. HR 180 at home  5 mg of versed given by EMS

## 2020-06-11 NOTE — Progress Notes (Signed)
Physician Discharge Summary  AMAD MAU EPP:[id number] DOB: 07/22/1950 DOA: 06/07/2020  PCP: [facility]  Admit date: 06/07/2020 Discharge date: 06/11/2020  Admitted From: Home Disposition: Home with home hospice  Recommendations for Outpatient Follow-up:  1. As per hospice plan  Home Health: Hospice Equipment/Devices: Provided by hospice  Discharge Condition: Fair CODE STATUS: DNR Diet recommendation: Regular diet as tolerated  Clinical summary:  70 year old gentleman with history of metastatic non-small cell lung cancer status post multiple treatment, COPD on home oxygen 4 to 5 L, coronary artery disease, hepatitis C and hepatocellular carcinoma presented to [facility] on 2/23 with weakness.  He lives at home.  Woke up in the morning and felt extremely unsteady on his.  As well blurry vision.  On arrival to ER, code stroke was activated.  No focal deficits noted.  CT head showed 4 cm left cerebellar mass with necrosis and hemorrhage with surrounding edema.  He was transferred to [facility] ICU for neurology and neurosurgery consultation.  Treated with IV Decadron.  Left cerebellar mass with necrosis and hemorrhage with left-sided focal deficits: Treated with IV Decadron, converted to oral Decadron now. 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 All comfort care medications are available. Patient has good support system, wanting to go home with his daughter with home hospice in place. We will send all controlled substances including Ambien, Ativan, morphine to his pharmacy.  Further care will be directed by  hospice team. Patient will continue oral steroids, that may help with edema for the brain lesion.  Discharge home once DME and admission to home by hospice available. Discharge summary reviewed and no changes are needed.   Discharge Diagnoses:  Active Problems:   Intracranial bleeding (HCC)   Adenocarcinoma, metastatic (HCC)   Mass of brain   Goals of care, counseling/discussion   Palliative care by specialist   Shortness of breath   Anxiety    Discharge Instructions  Discharge Instructions    Diet general   Complete by: As directed    As tolerated     Allergies as of 06/11/2020      Reactions   Bee Venom Hives      Medication List    STOP taking these medications   aspirin EC 81 MG tablet   atorvastatin 20 MG tablet Commonly known as: LIPITOR   b complex vitamins capsule   benzonatate 200 MG capsule Commonly known as: TESSALON   bisoprolol 5 MG tablet Commonly known as: ZEBETA   ezetimibe 10 MG tablet Commonly known as: Zetia   famotidine-calcium carbonate-magnesium hydroxide 10-800-165 MG chewable tablet Commonly known as: PEPCID COMPLETE   multivitamin with minerals tablet   nitroGLYCERIN 0.4 MG SL tablet Commonly known as: NITROSTAT   VITAMIN C PO   VITAMIN D3 PO   ZINC PO     TAKE these medications   ALPRAZolam 0.5 MG tablet Commonly known as: XANAX Take 1 tablet (0.5 mg total) by mouth every 6 (six) hours as needed for up to 5 days for anxiety. What changed:   how much to take  when to take this   citalopram 20 MG tablet Commonly known as: CELEXA Take 20  mg by mouth daily.   dexamethasone 6 MG tablet Commonly known as: DECADRON Take 1 tablet (6 mg total) by mouth every 12 (twelve) hours for 14 days.   docusate sodium 100 MG capsule Commonly known as: COLACE Take 1 capsule (100 mg total) by mouth 2 (two) times daily.   doxycycline 100 MG tablet Commonly known as: VIBRA-TABS Take 1 tablet (100 mg total) by mouth every 12  (twelve) hours for 7 days.   esomeprazole 40 MG capsule Commonly known as: NEXIUM Take 40 mg by mouth daily.   hydrocortisone cream 1 % Apply to affected area 2 times daily What changed:   how much to take  how to take this  when to take this  reasons to take this  additional instructions   lidocaine 2 % solution Commonly known as: XYLOCAINE Use as directed 10 mLs in the mouth or throat as needed for mouth pain.   montelukast 10 MG tablet Commonly known as: Singulair One at bedtime every night What changed:   how much to take  how to take this  when to take this  additional instructions   morphine CONCENTRATE 10 MG/0.5ML Soln concentrated solution Place 0.25 mLs (5 mg total) under the tongue every 2 (two) hours as needed for shortness of breath, anxiety or moderate pain.   nicotine 21 mg/24hr patch Commonly known as: NICODERM CQ - dosed in mg/24 hours Place 21 mg onto the skin daily.   ondansetron 4 MG tablet Commonly known as: ZOFRAN Take 1 tablet (4 mg total) by mouth every 6 (six) hours as needed for nausea.   OXYGEN Inhale into the lungs as needed. 4 Liters supplemental o2 , as needed "when im up moving around"   oxymetazoline 0.05 % nasal spray Commonly known as: AFRIN Place 1 spray into both nostrils 2 (two) times daily as needed for congestion.   ProAir HFA 108 (90 Base) MCG/ACT inhaler Generic drug: albuterol Inhale 2 puffs into the lungs every 6 (six) hours as needed for wheezing.   tamsulosin 0.4 MG Caps capsule Commonly known as: FLOMAX Take 1 capsule (0.4 mg total) by mouth daily.   Trelegy Ellipta 100-62.5-25 MCG/INH Aepb Generic drug: Fluticasone-Umeclidin-Vilant INHALE 1 PUFF INTO THE LUNGS EVERY DAY What changed:   how much to take  how to take this  when to take this  additional instructions   VISINE OP Place 1 drop into both eyes daily as needed (irritation).   zolpidem 5 MG tablet Commonly known as: AMBIEN Take 1  tablet (5 mg total) by mouth at bedtime.       Allergies  Allergen Reactions  . Bee Venom Hives    Consultations:  Pulmonary  Neurosurgery  Neurology   Procedures/Studies: CT Head Wo Contrast  Result Date: 06/07/2020 CLINICAL DATA:  History of non-small cell lung cancer. Acute presentation with left-sided weakness. EXAM: CT HEAD WITHOUT CONTRAST TECHNIQUE: Contiguous axial images were obtained from the base of the skull through the vertex without intravenous contrast. COMPARISON:  MRI 04/30/2017 FINDINGS: Brain: 4 cm left cerebellar mass with necrosis and hemorrhage. Mass effect with flattening of the fourth ventricle. Second 4 cm hemorrhagic necrotic mass at the right parietal vertex with moderate surrounding edema. Both of these are consistent with intracranial metastatic disease. Mild chronic small-vessel ischemic change seen elsewhere within the cerebral hemispheric white matter. No sign of ischemic infarction. No apparent hydrocephalus of the lateral or third ventricles. No extra-axial fluid collection. Vascular: There is atherosclerotic calcification of the major  vessels at the base of the brain. Skull: Negative Sinuses/Orbits: Clear/normal Other: None IMPRESSION: 4 cm left cerebellar mass with necrosis and hemorrhage. 4 cm necrotic and hemorrhagic mass at the right parietal vertex with moderate surrounding edema. Findings consistent with intracranial metastatic disease. Some mass-effect upon the fourth ventricle but no sign of obstructive hydrocephalus at this moment. Electronically Signed   By: Nelson Chimes M.D.   On: 06/07/2020 16:29   (Echo, Carotid, EGD, Colonoscopy, ERCP)    Subjective: Patient was seen and examined.  No overnight events.  Pain is controlled.  He does have chronic dyspnea and dry cough.  He was asking what time is he going home. I told him that his hospital bed is not delivered to his daughter's home yet.   Discharge Exam: Vitals:   06/11/20 0521 06/11/20  0813  BP: 123/74   Pulse: 93 80  Resp: (!) 22 (!) 22  Temp: 98.8 F (37.1 C)   SpO2: (!) 87% 94%   Vitals:   06/11/20 0044 06/11/20 0500 06/11/20 0521 06/11/20 0813  BP: 134/81  123/74   Pulse: 91  93 80  Resp: (!) 22  (!) 22 (!) 22  Temp: 98.3 F (36.8 C)  98.8 F (37.1 C)   TempSrc: Oral  Oral   SpO2: 94%  (!) 87% 94%  Weight:  61.5 kg    Height:        General: Patient is alert and awake.  He is thin and frail looking and debilitated. Chronically sick looking, mildly anxious.  On 5 L of oxygen. Cardiovascular: S1-S2 normal.  Regular rate rhythm. Respiratory: Bilateral clear.,  Some conducted airway sounds.  Looks fairly comfortable on 5 L. Abdominal: Soft, NT, ND, bowel sounds + Extremities: no edema, no cyanosis, all extremities with equal motor strength.    The results of significant diagnostics from this hospitalization (including imaging, microbiology, ancillary and laboratory) are listed below for reference.     Microbiology: Recent Results (from the past 240 hour(s))  Resp Panel by RT-PCR (Flu A&B, Covid) Nasopharyngeal Swab     Status: None   Collection Time: 06/07/20  4:18 PM   Specimen: Nasopharyngeal Swab; Nasopharyngeal(NP) swabs in vial transport medium  Result Value Ref Range Status   SARS Coronavirus 2 by RT PCR NEGATIVE NEGATIVE Final    Comment: (NOTE) SARS-CoV-2 target nucleic acids are NOT DETECTED.  The SARS-CoV-2 RNA is generally detectable in upper respiratory specimens during the acute phase of infection. The lowest concentration of SARS-CoV-2 viral copies this assay can detect is 138 copies/mL. A negative result does not preclude SARS-Cov-2 infection and should not be used as the sole basis for treatment or other patient management decisions. A negative result may occur with  improper specimen collection/handling, submission of specimen other than nasopharyngeal swab, presence of viral mutation(s) within the areas targeted by this assay,  and inadequate number of viral copies(<138 copies/mL). A negative result must be combined with clinical observations, patient history, and epidemiological information. The expected result is Negative.  Fact Sheet for Patients:  EntrepreneurPulse.com.au  Fact Sheet for Healthcare Providers:  IncredibleEmployment.be  This test is no t yet approved or cleared by the [place] FDA and  has been authorized for detection and/or diagnosis of SARS-CoV-2 by FDA under an Emergency Use Authorization (EUA). This EUA will remain  in effect (meaning this test can be used) for the duration of the COVID-19 declaration under Section 564(b)(1) of the Act, 21 U.S.C.section 360bbb-3(b)(1), unless the authorization is  terminated  or revoked sooner.       Influenza A by PCR NEGATIVE NEGATIVE Final   Influenza B by PCR NEGATIVE NEGATIVE Final    Comment: (NOTE) The Xpert Xpress SARS-CoV-2/FLU/RSV plus assay is intended as an aid in the diagnosis of influenza from Nasopharyngeal swab specimens and should not be used as a sole basis for treatment. Nasal washings and aspirates are unacceptable for Xpert Xpress SARS-CoV-2/FLU/RSV testing.  Fact Sheet for Patients: EntrepreneurPulse.com.au  Fact Sheet for Healthcare Providers: IncredibleEmployment.be  This test is not yet approved or cleared by the [place] FDA and has been authorized for detection and/or diagnosis of SARS-CoV-2 by FDA under an Emergency Use Authorization (EUA). This EUA will remain in effect (meaning this test can be used) for the duration of the COVID-19 declaration under Section 564(b)(1) of the Act, 21 U.S.C. section 360bbb-3(b)(1), unless the authorization is terminated or revoked.  Performed at [facility], [address] [place], [place] [address]   MRSA PCR Screening     Status: None   Collection Time: 06/07/20  9:30 PM   Specimen: Nasal  Mucosa; Nasopharyngeal  Result Value Ref Range Status   MRSA by PCR NEGATIVE NEGATIVE Final    Comment:        The GeneXpert MRSA Assay (FDA approved for NASAL specimens only), is one component of a comprehensive MRSA colonization surveillance program. It is not intended to diagnose MRSA infection nor to guide or monitor treatment for MRSA infections. Performed at [facility], [place] [address] [place], [place] [address]      Labs: BNP (last 3 results) No results for input(s): BNP in the last 8760 hours. Basic Metabolic Panel: Recent Labs  Lab 06/07/20 1617 06/07/20 1626 06/08/20 0250  NA 136 137  --   K 3.7 3.7  --   CL 94* 94*  --   CO2 30  --   --   GLUCOSE 160* 156*  --   BUN 17 19  --   CREATININE 0.48* 0.50* 0.54*  CALCIUM 9.2  --   --    Liver Function Tests: Recent Labs  Lab 06/07/20 1617  AST 33  ALT 31  ALKPHOS 100  BILITOT 0.8  PROT 7.3  ALBUMIN 3.7   No results for input(s): LIPASE, AMYLASE in the last 168 hours. No results for input(s): AMMONIA in the last 168 hours. CBC: Recent Labs  Lab 06/07/20 1617 06/07/20 1626 06/08/20 0250  WBC 9.5  --  8.0  NEUTROABS 8.5*  --   --   HGB 13.5 14.6 13.3  HCT 41.2 43.0 39.1  MCV 103.3*  --  100.3*  PLT 284  --  251   Cardiac Enzymes: No results for input(s): CKTOTAL, CKMB, CKMBINDEX, TROPONINI in the last 168 hours. BNP: Invalid input(s): POCBNP CBG: No results for input(s): GLUCAP in the last 168 hours. D-Dimer No results for input(s): DDIMER in the last 72 hours. Hgb A1c No results for input(s): HGBA1C in the last 72 hours. Lipid Profile No results for input(s): CHOL, HDL, LDLCALC, TRIG, CHOLHDL, LDLDIRECT in the last 72 hours. Thyroid function studies No results for input(s): TSH, T4TOTAL, T3FREE, THYROIDAB in the last 72 hours.  Invalid input(s): FREET3 Anemia work up No results for input(s): VITAMINB12, FOLATE, FERRITIN, TIBC, IRON, RETICCTPCT in the last 72 hours. Urinalysis     Component Value Date/Time   COLORURINE YELLOW 06/08/2020 1211   APPEARANCEUR CLEAR 06/08/2020 1211   LABSPEC 1.019 06/08/2020 1211  PHURINE 6.0 06/08/2020 1211   GLUCOSEU NEGATIVE 06/08/2020 1211   HGBUR NEGATIVE 06/08/2020 1211   BILIRUBINUR NEGATIVE 06/08/2020 1211   KETONESUR 20 (A) 06/08/2020 1211   PROTEINUR NEGATIVE 06/08/2020 1211   NITRITE NEGATIVE 06/08/2020 1211   LEUKOCYTESUR NEGATIVE 06/08/2020 1211   Sepsis Labs Invalid input(s): PROCALCITONIN,  WBC,  LACTICIDVEN Microbiology Recent Results (from the past 240 hour(s))  Resp Panel by RT-PCR (Flu A&B, Covid) Nasopharyngeal Swab     Status: None   Collection Time: 06/07/20  4:18 PM   Specimen: Nasopharyngeal Swab; Nasopharyngeal(NP) swabs in vial transport medium  Result Value Ref Range Status   SARS Coronavirus 2 by RT PCR NEGATIVE NEGATIVE Final    Comment: (NOTE) SARS-CoV-2 target nucleic acids are NOT DETECTED.  The SARS-CoV-2 RNA is generally detectable in upper respiratory specimens during the acute phase of infection. The lowest concentration of SARS-CoV-2 viral copies this assay can detect is 138 copies/mL. A negative result does not preclude SARS-Cov-2 infection and should not be used as the sole basis for treatment or other patient management decisions. A negative result may occur with  improper specimen collection/handling, submission of specimen other than nasopharyngeal swab, presence of viral mutation(s) within the areas targeted by this assay, and inadequate number of viral copies(<138 copies/mL). A negative result must be combined with clinical observations, patient history, and epidemiological information. The expected result is Negative.  Fact Sheet for Patients:  EntrepreneurPulse.com.au  Fact Sheet for Healthcare Providers:  IncredibleEmployment.be  This test is no t yet approved or cleared by the [place] FDA and  has been authorized for detection  and/or diagnosis of SARS-CoV-2 by FDA under an Emergency Use Authorization (EUA). This EUA will remain  in effect (meaning this test can be used) for the duration of the COVID-19 declaration under Section 564(b)(1) of the Act, 21 U.S.C.section 360bbb-3(b)(1), unless the authorization is terminated  or revoked sooner.       Influenza A by PCR NEGATIVE NEGATIVE Final   Influenza B by PCR NEGATIVE NEGATIVE Final    Comment: (NOTE) The Xpert Xpress SARS-CoV-2/FLU/RSV plus assay is intended as an aid in the diagnosis of influenza from Nasopharyngeal swab specimens and should not be used as a sole basis for treatment. Nasal washings and aspirates are unacceptable for Xpert Xpress SARS-CoV-2/FLU/RSV testing.  Fact Sheet for Patients: EntrepreneurPulse.com.au  Fact Sheet for Healthcare Providers: IncredibleEmployment.be  This test is not yet approved or cleared by the [place] FDA and has been authorized for detection and/or diagnosis of SARS-CoV-2 by FDA under an Emergency Use Authorization (EUA). This EUA will remain in effect (meaning this test can be used) for the duration of the COVID-19 declaration under Section 564(b)(1) of the Act, 21 U.S.C. section 360bbb-3(b)(1), unless the authorization is terminated or revoked.  Performed at [facility], [address], [place], [place] [address]   MRSA PCR Screening     Status: None   Collection Time: 06/07/20  9:30 PM   Specimen: Nasal Mucosa; Nasopharyngeal  Result Value Ref Range Status   MRSA by PCR NEGATIVE NEGATIVE Final    Comment:        The GeneXpert MRSA Assay (FDA approved for NASAL specimens only), is one component of a comprehensive MRSA colonization surveillance program. It is not intended to diagnose MRSA infection nor to guide or monitor treatment for MRSA infections. Performed at [facility], [place] [address] [place], [place] [address]      Total time spent: 25  minutes  SIGNED:   Barb Merino, MD  [facility] 06/11/2020, 10:35 AM

## 2020-06-11 NOTE — ED Notes (Signed)
This RN to bedside for rounding. Found patient to be awake and tracking staff around room. Pt transitioned from 15L non-rebreather to 6L [place] at this time. Pt tolerating nasal cannula without distress. Pt unable to follow commands at request, family remains at bedside.

## 2020-06-11 NOTE — ED Notes (Addendum)
This RN educated by NT that patient was calling for staff, reports anxiety and fear. On arrival to room, patient reports "I feel like I can't breath and I'm having a panic attack." Pt stating "Go in the drawer and get my lighter so I can burn this box." Pt reoriented to time, place and situation. Educated that patient is is in hospital for seizures and of smoking policy. Pt requesting something for anxiety and a nicotine patch. Admitting provider made aware at this time and new orders obtained.

## 2020-06-11 NOTE — ED Provider Notes (Signed)
[facility] EMERGENCY DEPARTMENT Provider Note   CSN: [id number] Arrival date & time: 06/11/20  1643     History Chief Complaint  Patient presents with  . Seizures    Adam Bass is a 70 y.o. adult.  He has a history of metastatic non-small cell lung cancer, COPD on home oxygen.  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  He was transition to hospice at home.  Today he experienced what sounds like seizure activity bilateral tonic-clonic right greater than left although it does sound like he was aware during this time.  This lasted over 2 hours before EMS was called and was given 5 mg of Versed.  Currently patient is breathing spontaneously on his own with sonorous respirations and showing no obvious signs of seizure activity.  The history is provided by the EMS personnel and a relative.  Seizures Seizure activity on arrival: no   Seizure type:  Tonic Initial focality:  Unable to specify Episode characteristics: abnormal movements and generalized shaking   Return to baseline: no   Severity:  Severe Duration:  2 hours Timing:  Once Progression:  Resolved Context: change in medication and intracranial lesion   PTA treatment:  Midazolam History of seizures: no        Past Medical History:  Diagnosis Date  . Anticoagulant long-term use    brillinta  . Bilateral lower extremity edema    feet, noticed since discharge from hospital admission 05-18-2018, "i was laid up in the bed for five days"   . CAD (coronary artery disease) cardiologist-- dr j. branch   hx STEMI 11-23-2017,  s/p  cardiac cath with Thrombectomy, PCI and DES x1 to midRCA, and other nonobstrucitve CAD involving pLAD, OM1, dRCA, posterior atrio, LVEF 45-50%,  LVEDP 40mHg  . COPD (chronic obstructive pulmonary disease) with emphysema ([facility]    pulmonology-- dr wert-- GOLD III mix (pt still smokes),  last exacerbation -due to the flu  , [facility]  . DOE (dyspnea on exertion)   . Ectatic abdominal aorta ([facility]    MRI 11/ 2019  . Hepatitis C GI-- dr rLaural Golden  dx 02-02-2018--- currently treated on antiviral medication  . Hepatocellular carcinoma ([facility]   . History of radiation therapy 06/03/17- 06/09/17   Left Lung treated to 54 Gy with 3 fx of 18 Gy. SBRT  . History of skin cancer    04/ 2017 excision from face  . History of ST elevation myocardial infarction (STEMI) 11/23/2017   inferior wall-- s/p cardiac cath w/ thrombectomy, PCI, and DES  . HLD (hyperlipidemia)   . Hypertension   . Myocardial infarction ([facility]   . Primary squamous cell carcinoma of left lung ([facility] oncologist-- dr sIsidore Moos  dx 02/ 2018-- Stage IA2 Left lower lung,  s/p  Stereotactic radiation completed 06-09-2017  . Productive cough    per pt mostly clear  . Requires supplemental oxygen    06-09-2018 currently pt is using a portable O2 as needed and at night  . S/P drug eluting coronary stent placement 11/23/2017   DES x1 to midRCA  . Urinary incontinence    wear depends  . Wears dentures    fuller upper,  lower partial  . Wears glasses     Patient Active Problem List   Diagnosis Date Noted  . Goals of care, counseling/discussion   . Palliative  care by specialist   . Shortness of breath   . Anxiety   . Adenocarcinoma, metastatic ([place]) 06/08/2020  . Mass of brain   . Intracranial bleeding ([place]) 06/07/2020  . 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 NSCLC of left lung ([place]) 03/15/2020  . S/P bronchoscopy with biopsy   . Mediastinal adenopathy   . Lung mass 02/25/2020  .                                                                                       01/04/2020  . Chronic respiratory failure with hypoxia ([place]) 09/17/2019  . Essential hypertension 09/17/2019  . GERD (gastroesophageal reflux disease) 06/15/2019  . Hepatic cirrhosis due to chronic hepatitis C infection ([place]) 12/15/2018  . Hepatocellular  carcinoma ([place]) 06/10/2018  . Influenza due to identified novel influenza A virus with other respiratory manifestations 05/17/2018  . Coronary artery disease, occlusive 05/16/2018  . COPD exacerbation ([place]) 05/13/2018  . Hepatitis C 05/13/2018  . Smoking 11/27/2017  . ETOH abuse 11/27/2017  . Heart attack ([place])   . Hyperlipidemia with target LDL less than 70   . LFT elevation   . Acute ST elevation myocardial infarction (STEMI) of inferior wall ([place]) 11/23/2017  . 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 Primary cancer of left lower lobe of lung ([place]) 04/30/2017  . COPD GOLD III/ still smoking cigars  07/21/2012    Past Surgical History:  Procedure Laterality Date  . BRONCHIAL BIOPSY  02/29/2020   Procedure: BRONCHIAL BIOPSIES;  Surgeon: Garner Nash, DO;  Location: [place] ENDOSCOPY;  Service: Pulmonary;;  . BRONCHIAL BRUSHINGS  02/29/2020   Procedure: BRONCHIAL BRUSHINGS;  Surgeon: Garner Nash, DO;  Location: [place] [facility]COPY;  Service: Pulmonary;;  . BRONCHIAL NEEDLE ASPIRATION BIOPSY  02/29/2020   Procedure: BRONCHIAL NEEDLE ASPIRATION BIOPSIES;  Surgeon: Garner Nash, DO;  Location: [place] ENDOSCOPY;  Service: Pulmonary;;  . BRONCHIAL WASHINGS  02/29/2020   Procedure: BRONCHIAL WASHINGS;  Surgeon: Garner Nash, DO;  Location: [place];  Service: Pulmonary;;  . COLONOSCOPY N/A 08/27/2012   Procedure: COLONOSCOPY;  Surgeon: Rogene Houston, MD;  [facility];  Service: Endoscopy;  Laterality: N/A;  1200  . CORONARY STENT INTERVENTION N/A 11/23/2017   Procedure: CORONARY STENT INTERVENTION;  Surgeon: Jettie Booze, MD;  Location: [place] CV LAB;  Service: Cardiovascular;  Laterality: N/A;  . ENDOBRONCHIAL ULTRASOUND N/A 02/29/2020   Procedure: ENDOBRONCHIAL ULTRASOUND;  Surgeon: Garner Nash, DO;  Location: [place];  Service: Pulmonary;  Laterality: N/A;  . FINE NEEDLE ASPIRATION   02/29/2020   Procedure: FINE NEEDLE ASPIRATION;  Surgeon: Garner Nash, DO;  Location: [place] [facility]COPY;  Service: Pulmonary;;  . IR RADIOLOGIST EVAL & MGMT  03/04/2018  . IR RADIOLOGIST EVAL & MGMT  10/06/2018  . IR RADIOLOGIST EVAL & MGMT  01/26/2019  . IR RADIOLOGIST EVAL & MGMT  08/11/2019  . IR RADIOLOGIST EVAL & MGMT  01/25/2020  . LEFT HEART CATH AND CORONARY ANGIOGRAPHY N/A 11/23/2017   Procedure: LEFT HEART CATH AND CORONARY ANGIOGRAPHY;  Surgeon: Jettie Booze, MD;  Location: [place] CV LAB;  Service: Cardiovascular;  Laterality: N/A;  . RADIOFREQUENCY ABLATION N/A 06/10/2018   Procedure: MICROWAVE THERMAL ABLATION LIVER;  Surgeon: Aletta Edouard, MD;  Location: [facility];  Service: Anesthesiology;  Laterality: N/A;  . VIDEO BRONCHOSCOPY WITH ENDOBRONCHIAL NAVIGATION Bilateral 02/29/2020   Procedure: VIDEO BRONCHOSCOPY WITH ENDOBRONCHIAL NAVIGATION;  Surgeon: Garner Nash, DO;  Location: [place];  Service: Pulmonary;  Laterality: Bilateral;     OB History   No obstetric history on file.     Family History  Problem Relation Age of Onset  . Breast cancer Sister   . Colon cancer Neg Hx     Social History   Tobacco Use  . Smoking status: Current Every Day Smoker    Packs/day: 0.75    Years: 50.00    Pack years: 37.50    Types: Cigars  . Smokeless tobacco: Never Used  . Tobacco comment: . He is smoking about 5-10 cigarettes daily.   Vaping Use  . Vaping Use: Former  . Quit date: 10/07/2017  Substance Use Topics  . Alcohol use: Yes    Alcohol/week: 8.0 standard drinks    Types: 8 Cans of beer per week    Comment: 8 beers weekly, give or take   . Drug use: No    Home Medications Prior to Admission medications   Medication Sig Start Date End Date Taking? Authorizing Provider  albuterol (PROAIR HFA) 108 (90 Base) MCG/ACT inhaler Inhale 2 puffs into the lungs every 6 (six) hours as needed for wheezing.     [provider]  ALPRAZolam Duanne Moron)  0.5 MG tablet Take 1 tablet (0.5 mg total) by mouth every 6 (six) hours as needed for up to 5 days for anxiety. 06/10/20 24-Jun-2020  Barb Merino, MD  citalopram (CELEXA) 20 MG tablet Take 20 mg by mouth daily. 06/07/20   [provider]  dexamethasone (DECADRON) 6 MG tablet Take 1 tablet (6 mg total) by mouth every 12 (twelve) hours for 14 days. 06/10/20 06/24/20  Barb Merino, MD  docusate sodium (COLACE) 100 MG capsule Take 1 capsule (100 mg total) by mouth 2 (two) times daily. 06/10/20   Barb Merino, MD  doxycycline (VIBRA-TABS) 100 MG tablet Take 1 tablet (100 mg total) by mouth every 12 (twelve) hours for 7 days. 06/10/20 06/17/20  Barb Merino, MD  esomeprazole (NEXIUM) 40 MG capsule Take 40 mg by mouth daily.     [provider]  Fluticasone-Umeclidin-Vilant (TRELEGY ELLIPTA) 100-62.5-25 MCG/INH AEPB INHALE 1 PUFF INTO THE LUNGS EVERY DAY Patient taking differently: Inhale 1 puff into the lungs daily. 08/28/19   Wurst, [place], PA-C  hydrocortisone cream 1 % Apply to affected area 2 times daily Patient taking differently: Apply 1 application topically 2 (two) times daily as needed for itching. 12/06/19   Avegno, Darrelyn Hillock, FNP  lidocaine (XYLOCAINE) 2 % solution Use as directed 10 mLs in the mouth or throat as needed for mouth pain. Patient not taking: No sig reported 04/26/20   Eppie Gibson, MD  montelukast (SINGULAIR) 10 MG tablet One at bedtime every night Patient taking differently: Take 10 mg by mouth at bedtime. 10/15/19   Tanda Rockers, MD  Morphine Sulfate (MORPHINE CONCENTRATE) 10 MG/0.5ML SOLN concentrated solution Place 0.25 mLs (5 mg total) under the tongue every 2 (two) hours as needed for shortness of breath, anxiety or moderate pain. 06/10/20   Barb Merino, MD  nicotine (NICODERM CQ - DOSED IN MG/24 HOURS) 21 mg/24hr patch Place  21 mg onto the skin daily. Patient not taking: No sig reported    [provider]  ondansetron (ZOFRAN) 4 MG tablet Take 1  tablet (4 mg total) by mouth every 6 (six) hours as needed for nausea. 06/10/20   Barb Merino, MD  OXYGEN Inhale into the lungs as needed. 4 Liters supplemental o2 , as needed "when im up moving around"    [provider]  oxymetazoline (AFRIN) 0.05 % nasal spray Place 1 spray into both nostrils 2 (two) times daily as needed for congestion.     [provider]  tamsulosin (FLOMAX) 0.4 MG CAPS capsule Take 1 capsule (0.4 mg total) by mouth daily. 05/11/20   Truitt Merle, MD  Tetrahydrozoline HCl (VISINE OP) Place 1 drop into both eyes daily as needed (irritation).    [provider]  zolpidem (AMBIEN) 5 MG tablet Take 1 tablet (5 mg total) by mouth at bedtime. 06/10/20   Barb Merino, MD    Allergies    Bee venom  Review of Systems   Review of Systems  Unable to perform ROS: Patient unresponsive  Neurological: Positive for seizures.    Physical Exam Updated Vital Signs BP 95/68   Pulse (!) 118   Temp 99.7 F (37.6 C) (Axillary)   Resp (!) 28   SpO2 98%   Physical Exam Vitals and nursing note reviewed.  Constitutional:      Appearance: Normal appearance. He is well-developed and well-nourished.  HENT:     Head: Normocephalic and atraumatic.  Eyes:     Conjunctiva/sclera: Conjunctivae normal.     Comments: Pupils 2 mm midline  Cardiovascular:     Rate and Rhythm: Regular rhythm. Tachycardia present.  Pulmonary:     Effort: Tachypnea and accessory muscle usage present.     Breath sounds: No rhonchi or rales.  Abdominal:     Tenderness: There is no abdominal tenderness. There is no guarding or rebound.  Musculoskeletal:        General: No deformity or signs of injury.     Cervical back: Neck supple.  Skin:    General: Skin is warm and dry.     Capillary Refill: Capillary refill takes less than 2 seconds.  Neurological:     GCS: GCS eye subscore is 1. GCS verbal subscore is 1. GCS motor subscore is 4.     Comments: Patient is not arousable to  voice.  He grimaced and fought back with some oral suctioning.  No focal shaking.  No eye-opening or speech.  Psychiatric:        Mood and Affect: Mood and affect normal.     ED Results / Procedures / Treatments   Labs (all labs ordered are listed, but only abnormal results are displayed) Labs Reviewed  BASIC METABOLIC PANEL - Abnormal; Notable for the following components:      Result Value   Sodium 123 (*)    Chloride 88 (*)    Glucose, Bld 119 (*)    BUN 28 (*)    Calcium 7.8 (*)    All other components within normal limits  CBC WITH DIFFERENTIAL/PLATELET - Abnormal; Notable for the following components:   WBC 16.8 (*)    RBC 3.98 (*)    MCV 102.0 (*)    MCH 34.2 (*)    Neutro Abs 14.8 (*)    Lymphs Abs 0.3 (*)    Monocytes Absolute 1.7 (*)    Abs Immature Granulocytes 0.09 (*)  All other components within normal limits  COMPREHENSIVE METABOLIC PANEL - Abnormal; Notable for the following components:   Chloride 95 (*)    Glucose, Bld 111 (*)    BUN 29 (*)    Creatinine, Ser 0.46 (*)    Total Protein 6.4 (*)    Albumin 3.4 (*)    All other components within normal limits  CBC - Abnormal; Notable for the following components:   WBC 17.4 (*)    RBC 4.15 (*)    MCV 102.2 (*)    All other components within normal limits  BRAIN NATRIURETIC PEPTIDE - Abnormal; Notable for the following components:   B Natriuretic Peptide 147.0 (*)    All other components within normal limits  CBG MONITORING, ED - Abnormal; Notable for the following components:   Glucose-Capillary 125 (*)    All other components within normal limits  RESP PANEL BY RT-PCR (FLU A&B, COVID) ARPGX2  MRSA PCR SCREENING  MAGNESIUM    EKG None  Radiology CT HEAD WO CONTRAST  Result Date: 06/11/2020 CLINICAL DATA:  Seizure, nontraumatic (Age >= 41y) EXAM: CT HEAD WITHOUT CONTRAST TECHNIQUE: Contiguous axial images were obtained from the base of the skull through the vertex without intravenous contrast.  COMPARISON:  06/07/2020 and prior. FINDINGS: Brain: Known hemorrhagic left cerebellar mass is obscured by adjacent beam hardening/streak artifact. This also limits evaluation of left cerebellar edema and fourth ventricular effacement. Redemonstration of right parietal hemorrhagic and necrotic mass measuring 4.1 by 3.4 by 4.1 cm. Associated perilesional edema is unchanged. No new focal hypodensity. No significant midline shift. Stable appearance of the ventricular system. No extra-axial fluid collection. Vascular: No hyperdense vessel or unexpected calcification. Skull: Negative for fracture or focal lesion. Sinuses/Orbits: Normal orbits. Clear paranasal sinuses. No mastoid effusion. Other: Motion degraded exam. IMPRESSION: Right parietal hemorrhagic/necrotic mass with perilesional edema is unchanged. Poor visualization of left cerebellar mass secondary to beam hardening/streak artifact. MRI head with and without contrast is recommended for better evaluation. Electronically Signed   By: Primitivo Gauze M.D.   On: 06/11/2020 17:46   DG CHEST PORT 1 VIEW  Result Date: 06/11/2020 CLINICAL DATA:  Low O2 sat EXAM: PORTABLE CHEST 1 VIEW COMPARISON:  February 29, 2020 FINDINGS: The heart size and mediastinal contours are within normal limits. Aortic knob calcifications. Hyperinflation of the upper lung zones are seen. The visualized skeletal structures are unremarkable. IMPRESSION: No active disease.  Findings of COPD. Electronically Signed   By: Prudencio Pair M.D.   On: 06/11/2020 19:31    Procedures Procedures   Medications Ordered in ED Medications  dexamethasone (DECADRON) injection 6 mg (6 mg Intravenous Given 06/12/20 0930)  doxycycline (VIBRA-TABS) tablet 100 mg (100 mg Oral Given 06/12/20 0930)  albuterol (VENTOLIN HFA) 108 (90 Base) MCG/ACT inhaler 2 puff (has no administration in time range)  mupirocin ointment (BACTROBAN) 2 % ( Nasal Given 06/12/20 0930)  0.9 %  sodium chloride infusion (  Intravenous Rate/Dose Change 06/12/20 0749)  nicotine (NICODERM CQ - dosed in mg/24 hours) patch 21 mg (21 mg Transdermal Patch Applied 06/12/20 0931)  morphine 2 MG/ML injection 2 mg (2 mg Intravenous Given 06/12/20 0931)  feeding supplement (ENSURE ENLIVE / ENSURE PLUS) liquid 237 mL (237 mLs Oral Not Given 06/12/20 0931)  Chlorhexidine Gluconate Cloth 2 % PADS 6 each (6 each Topical Given 06/12/20 0931)  MEDLINE mouth rinse (has no administration in time range)  umeclidinium bromide (INCRUSE ELLIPTA) 62.5 MCG/INH 1 puff (1 puff Inhalation Given 06/12/20 0753)  fluticasone furoate-vilanterol (BREO ELLIPTA) 100-25 MCG/INH 1 puff (1 puff Inhalation Given 06/12/20 0753)  sodium chloride 0.9 % bolus 500 mL (0 mLs Intravenous Stopped 06/11/20 1830)  levETIRAcetam (KEPPRA) 2,000 mg in sodium chloride 0.9 % 250 mL IVPB (0 mg Intravenous Stopped 06/11/20 1829)  levETIRAcetam (KEPPRA) 1000 MG/100ML IVPB (  Stopped 06/11/20 1830)  hydrALAZINE (APRESOLINE) injection 10 mg (10 mg Intravenous Given 06/12/20 0447)  bisacodyl (DULCOLAX) suppository 10 mg (10 mg Rectal Given 06/12/20 0931)    ED Course  I have reviewed the triage vital signs and the nursing notes.  Pertinent labs & imaging results that were available during my care of the patient were reviewed by me and considered in my medical decision making (see chart for details).  Clinical Course as of 06/12/20 7116  Nancy Fetter Jun 11, 2020  1707 Discussed with Dr. Cheral Marker neuro hospitalist.  He recommended Keppra 2000 mg load and 500 mg IV or p.o. twice daily [MB]  1712 Is a DNR/DNI. [MB]  5790 Discussed with Dr. Cruzita Lederer [facility] hospitalist who asked if we could review the case with neurosurgery and make sure that they are comfortable with him staying at [place]. [MB]  3833 CT read as no obvious changes from prior. [MB]  3832 Reassessed patient, he still with sonorous breathing on a nonrebreather.  He seems a little bit lighter and will change his facial  expression with stimulation but is still not opening his eyes. [MB]  2008 Patient is now awake and talking.  Following commands with his right side on the left side weak in the sounds like it was during last admission.  Still tachycardic.  Oxygen has been titrated down to 6 L. [MB]    Clinical Course User Index [MB] Hayden Rasmussen, MD   MDM Rules/Calculators/A&P                         This patient complains of 2 hours of seizure-like activity; this involves an extensive number of treatment Options and is a complaint that carries with it a high risk of complications and Morbidity. The differential includes seizure worsening head bleed, worsening tumor, metabolic derangement, status epilepticus  I ordered, reviewed and interpreted labs, which included CBC with elevated white count, question reactive versus infection hemoglobin, chemistries with low sodium otherwise fairly unremarkable, Covid testing negative I ordered medication IV Keppra load, IV fluids I ordered imaging studies which included head CT and I independently    visualized and interpreted imaging which showed no acute change from priors Additional history obtained from EMS and patient's daughter Previous records obtained and reviewed in epic including prior admission to [facility] consulted [facility] hospitalist Dr. Renne Crigler Dr. Cheral Marker neurologist and discussed lab and imaging findings  Critical Interventions: None  After the interventions stated above, I reevaluated the patient and found patient's mental status to be improved. He will be admitted to the hospital for further management.  Final Clinical Impression(s) / ED Diagnoses Final diagnoses:  Seizure ([place])  Metastatic non-small cell lung cancer ([place])  Hyponatremia    Rx / DC Orders ED Discharge Orders    None       Hayden Rasmussen, MD 06/12/20 1000

## 2020-06-11 NOTE — ED Notes (Signed)
Pt is inpatient status and vital signs will be completed accordingly.

## 2020-06-11 NOTE — ED Notes (Signed)
Entered room and introduced self to family at bedside. At this time, patient is unresponsive to verbal stimuli, cardiac monitor in place and vital signs cycling q30 minutes at this time. Bed is locked in the lowest position, side rails x2, and family at bedside.  Pt placed on external male catheter for voiding. Pt remains on 15L non-rebreather at this time.  This RN notes that patient is resistant to deep suctioning but isn't responsive to voice.   Family at bedside educated on current plan of care and in agreement at this time.

## 2020-06-11 NOTE — TOC Transition Note (Signed)
Transition of Care [facility]) - CM/SW Discharge Note   Patient Details  Name: Adam Bass MRN: [id number] Date of Birth: 1950/05/20  [facility] CM/SW Contact:  Carles Collet, RN Phone Number: 06/11/2020, 10:44 AM   Clinical Narrative:   Adam Bass w patent's daughter Adam Bass. She confirms that all DME, bed, oxygen, etc has beem delivered and she ready to accept patient at home anytime. Nurse notified to call Adam Bass to go over DC instructions, PTAR called. Notified [facility] of DC.       Barriers to Discharge: Equipment Delay   Patient Goals and CMS Choice Patient states their goals for this hospitalization and ongoing recovery are:: home w hospice to daughter Adam Bass's house      Discharge Placement                       Discharge Plan and Services   Discharge Planning Services: CM Consult Post Acute Care Choice: Hospice              Date DME Agency Contacted: 06/10/20 Time DME Agency Contacted: [phone] Representative spoke with at DME Agency: [facility] Date [place]: 06/10/20 Time [place]: 1247 Representative spoke with at [place]: Sharyn Lull, answering service  Social Determinants of Health ([place]) Interventions     Readmission Risk Interventions No flowsheet data found.

## 2020-06-12 ENCOUNTER — Observation Stay (HOSPITAL_COMMUNITY)
Admit: 2020-06-12 | Discharge: 2020-06-12 | Disposition: A | Discharge status: Home / Self Care | Payer: Medicare Other | Attending: Internal Medicine | Admitting: Internal Medicine

## 2020-06-12 ENCOUNTER — Encounter (HOSPITAL_COMMUNITY): Payer: Self-pay | Admitting: Internal Medicine

## 2020-06-12 DIAGNOSIS — J9621 Acute and chronic respiratory failure with hypoxia: Secondary | ICD-10-CM | POA: Diagnosis not present

## 2020-06-12 DIAGNOSIS — B1920 Unspecified viral hepatitis C without hepatic coma: Secondary | ICD-10-CM | POA: Diagnosis not present

## 2020-06-12 DIAGNOSIS — F17290 Nicotine dependence, other tobacco product, uncomplicated: Secondary | ICD-10-CM | POA: Diagnosis not present

## 2020-06-12 DIAGNOSIS — C220 Liver cell carcinoma: Secondary | ICD-10-CM | POA: Diagnosis not present

## 2020-06-12 DIAGNOSIS — R569 Unspecified convulsions: Secondary | ICD-10-CM | POA: Diagnosis present

## 2020-06-12 DIAGNOSIS — C7931 Secondary malignant neoplasm of brain: Secondary | ICD-10-CM | POA: Diagnosis not present

## 2020-06-12 DIAGNOSIS — J449 Chronic obstructive pulmonary disease, unspecified: Secondary | ICD-10-CM | POA: Diagnosis not present

## 2020-06-12 DIAGNOSIS — R0902 Hypoxemia: Secondary | ICD-10-CM | POA: Diagnosis not present

## 2020-06-12 DIAGNOSIS — Z7901 Long term (current) use of anticoagulants: Secondary | ICD-10-CM | POA: Diagnosis not present

## 2020-06-12 DIAGNOSIS — Z79899 Other long term (current) drug therapy: Secondary | ICD-10-CM | POA: Diagnosis not present

## 2020-06-12 DIAGNOSIS — I2510 Atherosclerotic heart disease of native coronary artery without angina pectoris: Secondary | ICD-10-CM | POA: Diagnosis not present

## 2020-06-12 DIAGNOSIS — C3490 Malignant neoplasm of unspecified part of unspecified bronchus or lung: Secondary | ICD-10-CM | POA: Diagnosis not present

## 2020-06-12 DIAGNOSIS — E871 Hypo-osmolality and hyponatremia: Secondary | ICD-10-CM | POA: Diagnosis not present

## 2020-06-12 DIAGNOSIS — Z20822 Contact with and (suspected) exposure to covid-19: Secondary | ICD-10-CM | POA: Diagnosis not present

## 2020-06-12 DIAGNOSIS — G939 Disorder of brain, unspecified: Secondary | ICD-10-CM | POA: Diagnosis not present

## 2020-06-12 LAB — CBC
HCT: 42.4 % (ref 39.0–52.0)
Hemoglobin: 14.1 g/dL (ref 13.0–17.0)
MCH: 34 pg (ref 26.0–34.0)
MCHC: 33.3 g/dL (ref 30.0–36.0)
MCV: 102.2 fL — ABNORMAL HIGH (ref 80.0–100.0)
Platelets: 219 10*3/uL (ref 150–400)
RBC: 4.15 MIL/uL — ABNORMAL LOW (ref 4.22–5.81)
RDW: 11.7 % (ref 11.5–15.5)
WBC: 17.4 10*3/uL — ABNORMAL HIGH (ref 4.0–10.5)
nRBC: 0 % (ref 0.0–0.2)

## 2020-06-12 LAB — COMPREHENSIVE METABOLIC PANEL
ALT: 30 U/L (ref 0–44)
AST: 37 U/L (ref 15–41)
Albumin: 3.4 g/dL — ABNORMAL LOW (ref 3.5–5.0)
Alkaline Phosphatase: 77 U/L (ref 38–126)
Anion gap: 11 (ref 5–15)
BUN: 29 mg/dL — ABNORMAL HIGH (ref 8–23)
CO2: 29 mmol/L (ref 22–32)
Calcium: 9.2 mg/dL (ref 8.9–10.3)
Chloride: 95 mmol/L — ABNORMAL LOW (ref 98–111)
Creatinine, Ser: 0.46 mg/dL — ABNORMAL LOW (ref 0.61–1.24)
GFR, Estimated: >60 mL/min (ref >60)
Glucose, Bld: 111 mg/dL — ABNORMAL HIGH (ref 70–99)
Potassium: 4.2 mmol/L (ref 3.5–5.1)
Sodium: 135 mmol/L (ref 135–145)
Total Bilirubin: 0.9 mg/dL (ref 0.3–1.2)
Total Protein: 6.4 g/dL — ABNORMAL LOW (ref 6.5–8.1)

## 2020-06-12 LAB — MRSA PCR SCREENING: MRSA by PCR: NEGATIVE

## 2020-06-12 MED ORDER — UMECLIDINIUM BROMIDE 62.5 MCG/INH IN AEPB
1.0000 | INHALATION_SPRAY | Freq: Every day | RESPIRATORY_TRACT | Status: DC
  Administered 2020-06-12: 08:00:00 1 via RESPIRATORY_TRACT
  Filled 2020-06-12: qty 7

## 2020-06-12 MED ORDER — MORPHINE SULFATE (PF) 2 MG/ML IV SOLN
2.0000 mg | INTRAVENOUS | Status: DC | PRN
  Administered 2020-06-12 (×2): 2 mg via INTRAVENOUS
  Filled 2020-06-12 (×2): qty 1

## 2020-06-12 MED ORDER — ENSURE ENLIVE PO LIQD: 237.0000 mL | Freq: Two times a day (BID) | ORAL | Status: DC

## 2020-06-12 MED ORDER — ENSURE ENLIVE PO LIQD: 237.0000 mL | Freq: Three times a day (TID) | ORAL | 12 refills | Status: AC

## 2020-06-12 MED ORDER — MORPHINE SULFATE (PF) 2 MG/ML IV SOLN
2.0000 mg | INTRAVENOUS | Status: DC | PRN
  Administered 2020-06-12 (×3): 2 mg via INTRAVENOUS
  Filled 2020-06-12 (×3): qty 1

## 2020-06-12 MED ORDER — FLUTICASONE FUROATE-VILANTEROL 100-25 MCG/INH IN AEPB
1.0000 | INHALATION_SPRAY | Freq: Every day | RESPIRATORY_TRACT | Status: DC
  Administered 2020-06-12: 1 via RESPIRATORY_TRACT
  Filled 2020-06-12: qty 28

## 2020-06-12 MED ORDER — CHLORHEXIDINE GLUCONATE CLOTH 2 % EX PADS
6.0000 | MEDICATED_PAD | Freq: Every day | CUTANEOUS | Status: DC
  Administered 2020-06-12: 6 via TOPICAL

## 2020-06-12 MED ORDER — LEVETIRACETAM IN NACL 1000 MG/100ML IV SOLN
1000.0000 mg | Freq: Once | INTRAVENOUS | Status: AC
  Administered 2020-06-12: 1000 mg via INTRAVENOUS
  Filled 2020-06-12: qty 100

## 2020-06-12 MED ORDER — POLYVINYL ALCOHOL 1.4 % OP SOLN: 1.0000 [drp] | OPHTHALMIC | Status: DC | PRN

## 2020-06-12 MED ORDER — HYDRALAZINE HCL 20 MG/ML IJ SOLN
10.0000 mg | Freq: Once | INTRAMUSCULAR | Status: AC
  Administered 2020-06-12: 10 mg via INTRAVENOUS
  Filled 2020-06-12: qty 1

## 2020-06-12 MED ORDER — LEVETIRACETAM 500 MG PO TABS: 500.0000 mg | ORAL_TABLET | Freq: Two times a day (BID) | ORAL | 3 refills | Status: AC

## 2020-06-12 MED ORDER — BISACODYL 10 MG RE SUPP
10.0000 mg | Freq: Once | RECTAL | Status: AC
  Administered 2020-06-12: 10 mg via RECTAL
  Filled 2020-06-12: qty 1

## 2020-06-12 MED ORDER — FLEET ENEMA 7-19 GM/118ML RE ENEM
1.0000 | ENEMA | Freq: Once | RECTAL | Status: AC
  Administered 2020-06-12: 1 via RECTAL

## 2020-06-12 MED ORDER — ORAL CARE MOUTH RINSE: 15.0000 mL | Freq: Two times a day (BID) | OROMUCOSAL | Status: DC

## 2020-06-12 NOTE — Progress Notes (Signed)
Report called to Nurse at [facility]. PIV removed, belongings returned and Pt transported via [facility].

## 2020-06-12 NOTE — Discharge Summary (Signed)
Adam Bass, is a 70 y.o. adult  DOB 1950-07-19  MRN [id number].  Admission date:  06/11/2020  Admitting Physician  Caren Griffins, MD  Discharge Date:  06/12/2020   Primary MD  [facility], [facility]  Recommendations for primary care physician for things to follow:   -Transfer to residential hospice house with full comfort care  Admission Diagnosis  Hypoxemia [R09.02] Seizure [facility]) [R56.9]   Discharge Diagnosis  Hypoxemia [R09.02] Seizure ([place]) [R56.9]    Active Problems:   Seizure [facility])      Past Medical History:  Diagnosis Date  . Anticoagulant long-term use    brillinta  . Bilateral lower extremity Bass    feet, noticed since discharge from hospital admission 05-18-2018, "i was laid up in the bed for five days"   . CAD (coronary artery disease) cardiologist-- dr j. branch   hx STEMI 11-23-2017,  s/p  cardiac cath with Thrombectomy, PCI and DES x1 to midRCA, and other nonobstrucitve CAD involving pLAD, OM1, dRCA, posterior atrio, LVEF 45-50%,  LVEDP 60mmHg  . COPD (chronic obstructive pulmonary disease) with emphysema [facility])    pulmonology-- dr wert-- GOLD III mix (pt still smokes),  last exacerbation -due to the flu , discharged 05-18-2018 at [facility]   . DOE (dyspnea on exertion)   . Ectatic abdominal aorta ([place])    MRI 11/ 2019  . Hepatitis C GI-- dr Laural Golden   dx 02-02-2018--- currently treated on antiviral medication  . Hepatocellular carcinoma ([place])   . History of radiation therapy 06/03/17- 06/09/17   Left Lung treated to 54 Gy with 3 fx of 18 Gy. SBRT  . History of skin cancer    04/ 2017 excision from face  . History of ST elevation myocardial infarction (STEMI) 11/23/2017   inferior wall-- s/p cardiac cath w/ thrombectomy, PCI, and DES  . HLD (hyperlipidemia)   . Hypertension   . Myocardial infarction ([place])   . Primary squamous cell carcinoma of left lung [facility])  oncologist-- dr Isidore Moos   dx 02/ 2018-- Stage IA2 Left lower lung,  s/p  Stereotactic radiation completed 06-09-2017  . Productive cough    per pt mostly clear  . Requires supplemental oxygen    06-09-2018 currently pt is using a portable O2 as needed and at night  . S/P drug eluting coronary stent placement 11/23/2017   DES x1 to midRCA  . Urinary incontinence    wear depends  . Wears dentures    fuller upper,  lower partial  . Wears glasses     Past Surgical History:  Procedure Laterality Date  . BRONCHIAL BIOPSY  02/29/2020   Procedure: BRONCHIAL BIOPSIES;  Surgeon: Garner Nash, DO;  Location: [place] ENDOSCOPY;  Service: Pulmonary;;  . BRONCHIAL BRUSHINGS  02/29/2020   Procedure: BRONCHIAL BRUSHINGS;  Surgeon: Garner Nash, DO;  Location: [place] ENDOSCOPY;  Service: Pulmonary;;  . BRONCHIAL NEEDLE ASPIRATION BIOPSY  02/29/2020   Procedure: BRONCHIAL NEEDLE ASPIRATION BIOPSIES;  Surgeon: Garner Nash, DO;  Location: [place]  ENDOSCOPY;  Service: Pulmonary;;  . BRONCHIAL WASHINGS  02/29/2020   Procedure: BRONCHIAL WASHINGS;  Surgeon: Garner Nash, DO;  Location: [place];  Service: Pulmonary;;  . COLONOSCOPY N/A 08/27/2012   Procedure: COLONOSCOPY;  Surgeon: Rogene Houston, MD;  [facility];  Service: Endoscopy;  Laterality: N/A;  1200  . CORONARY STENT INTERVENTION N/A 11/23/2017   Procedure: CORONARY STENT INTERVENTION;  Surgeon: Jettie Booze, MD;  Location: [place] CV LAB;  Service: Cardiovascular;  Laterality: N/A;  . ENDOBRONCHIAL ULTRASOUND N/A 02/29/2020   Procedure: ENDOBRONCHIAL ULTRASOUND;  Surgeon: Garner Nash, DO;  Location: [place];  Service: Pulmonary;  Laterality: N/A;  . FINE NEEDLE ASPIRATION  02/29/2020   Procedure: FINE NEEDLE ASPIRATION;  Surgeon: Garner Nash, DO;  Location: [place] ENDOSCOPY;  Service: Pulmonary;;  . IR RADIOLOGIST EVAL & MGMT  03/04/2018  . IR RADIOLOGIST EVAL & MGMT  10/06/2018  . IR RADIOLOGIST EVAL &  MGMT  01/26/2019  . IR RADIOLOGIST EVAL & MGMT  08/11/2019  . IR RADIOLOGIST EVAL & MGMT  01/25/2020  . LEFT HEART CATH AND CORONARY ANGIOGRAPHY N/A 11/23/2017   Procedure: LEFT HEART CATH AND CORONARY ANGIOGRAPHY;  Surgeon: Jettie Booze, MD;  Location: [place] CV LAB;  Service: Cardiovascular;  Laterality: N/A;  . RADIOFREQUENCY ABLATION N/A 06/10/2018   Procedure: MICROWAVE THERMAL ABLATION LIVER;  Surgeon: Aletta Edouard, MD;  Location: [facility];  Service: Anesthesiology;  Laterality: N/A;  . VIDEO BRONCHOSCOPY WITH ENDOBRONCHIAL NAVIGATION Bilateral 02/29/2020   Procedure: VIDEO BRONCHOSCOPY WITH ENDOBRONCHIAL NAVIGATION;  Surgeon: Garner Nash, DO;  Location: [place];  Service: Pulmonary;  Laterality: Bilateral;     HPI  from the history and physical done on the day of admission:   Chief Complaint: seizure  HPI: Adam Bass is a 70 y.o. adult with medical history significant of metastatic non small cell lung cancer currently on hospice, COPD on home excision 4-5 L, CAD, hep C and hepatocellular carcinoma comes into the hospital with seizures.  Patient was discharged just yesterday from [facility].  Adam Bass, Adam Bass.  Neurology and neurosurgery consulted, Adam was treated conservatively with Decadron, no surgical interventions were performed.  Palliative care was consulted as well, Adam was transitioned to DNR and was discharged home with hospice.  Adam was sent home with Decadron but I do not see any antiepileptics.  Family tells me that Adam was home for perhaps 2 hours, has not even had time to establish care with hospice when Adam started having seizures so had to bring him back to the hospital.  Patient was given Ativan by EMS, Adam was brought to the ED and currently Adam is lethargic but opens eyes intermittently.  History as per daughter, who was  at bedside  ED Course: In the ED his temp is 99.7, breathing 19 times a minute, soft blood pressure with systolic in the 41D-408X.  Adam was placed on nonrebreather due to lethargy.  Was a sodium of 123 from prior value of 137 3 days ago.  Has a white count of 16.8.  Dr. Cheral Marker with neurology was consulted and recommended Keppra load with 2 g followed by 500 mg twice daily  Review of Systems: All systems reviewed, and apart from HPI, all negative     Hospital Course:   Seizure --in the setting of hyponatremia and  metastatic lesions to the brain -No further seizures, okay to discharge on Keppra as per neurology  Active Problems Acute on chronic hypoxic respiratory failure--history of COPD and tobacco abuse- Chest x-ray shows no evidence of active disease -Continue supplemental oxygen  Hyponatremia --resolved with hydration, Na is up 135 from 123 on admission  Stage IV lung cancer with brain mets, right parietal hemorrhagic/necrotic mass with perilesional Bass -currently on hospice.  Placed on antiepileptics now as well as Decadron.  Adam is DNR confirmed with daughter -Transfer to residential hospice  - chronic hypoxic respiratory failure-continue supportive treatment.  Leukocytosis-likely due to seizures and steroid use, reactive.    Hepatocellular carcinoma, history of hep C -noted, Adam has a history of segment  Ablation----Transfer to residential hospice house with full comfort care  CAD, history of STEMI --Transfer to residential hospice house with full comfort care   DVT prophylaxis: SCDs Code Status: DNR Family Communication: Daughter   ---Transfer to residential hospice house with full comfort care Consults called: neurology   Discharge Condition: -Transfer to residential hospice house with full comfort care  Follow UP   Contact information for after-discharge care    [place] .   Service: Inpatient Hospice Contact  information: [address] [phone]                   Diet and Activity recommendation:  As advised  Discharge Instructions    Discharge Instructions    Call MD for:  difficulty breathing, headache or visual disturbances   Complete by: As directed    Call MD for:  persistant dizziness or light-headedness   Complete by: As directed    Call MD for:  persistant nausea and vomiting   Complete by: As directed    Call MD for:  severe uncontrolled pain   Complete by: As directed    Call MD for:  temperature >100.4   Complete by: As directed    Diet general   Complete by: As directed    Discharge instructions   Complete by: As directed    -Transfer to residential hospice house with full comfort care   Increase activity slowly   Complete by: As directed         Discharge Medications     Allergies as of 06/12/2020      Reactions   Bee Venom Hives      Medication List    STOP taking these medications   citalopram 20 MG tablet Commonly known as: CELEXA   doxycycline 100 MG tablet Commonly known as: VIBRA-TABS   hydrocortisone cream 1 %   montelukast 10 MG tablet Commonly known as: Singulair   VISINE OP   zolpidem 5 MG tablet Commonly known as: AMBIEN     TAKE these medications   ALPRAZolam 0.5 MG tablet Commonly known as: XANAX Take 1 tablet (0.5 mg total) by mouth every 6 (six) hours as needed for up to 5 days for anxiety.   dexamethasone 6 MG tablet Commonly known as: DECADRON Take 1 tablet (6 mg total) by mouth every 12 (twelve) hours for 14 days.   docusate sodium 100 MG capsule Commonly known as: COLACE Take 1 capsule (100 mg total) by mouth 2 (two) times daily.   esomeprazole 40 MG capsule Commonly known as: NEXIUM Take 40 mg by mouth daily.   feeding supplement Liqd Take 237 mLs by mouth 3 (three) times daily.   levETIRAcetam  500 MG tablet Commonly known as: Keppra Take 1 tablet (500 mg total) by  mouth 2 (two) times daily.   lidocaine 2 % solution Commonly known as: XYLOCAINE Use as directed 10 mLs in the mouth or throat as needed for mouth pain.   morphine CONCENTRATE 10 MG/0.5ML Soln concentrated solution Place 0.25 mLs (5 mg total) under the tongue every 2 (two) hours as needed for shortness of breath, anxiety or moderate pain.   nicotine 21 mg/24hr patch Commonly known as: NICODERM CQ - dosed in mg/24 hours Place 21 mg onto the skin daily.   ondansetron 4 MG tablet Commonly known as: ZOFRAN Take 1 tablet (4 mg total) by mouth every 6 (six) hours as needed for nausea.   OXYGEN Inhale into the lungs as needed. 4 Liters supplemental o2 , as needed "when im up moving around"   oxymetazoline 0.05 % nasal spray Commonly known as: AFRIN Place 1 spray into both nostrils 2 (two) times daily as needed for congestion.   ProAir HFA 108 (90 Base) MCG/ACT inhaler Generic drug: albuterol Inhale 2 puffs into the lungs every 6 (six) hours as needed for wheezing.   tamsulosin 0.4 MG Caps capsule Commonly known as: FLOMAX Take 1 capsule (0.4 mg total) by mouth daily.   Trelegy Ellipta 100-62.5-25 MCG/INH Aepb Generic drug: Fluticasone-Umeclidin-Vilant INHALE 1 PUFF INTO THE LUNGS EVERY DAY What changed:   how much to take  how to take this  when to take this  additional instructions       Major procedures and Radiology Reports - PLEASE review detailed and final reports for all details, in brief -   Adam HEAD WO CONTRAST  Result Date: 06/11/2020 CLINICAL DATA:  Seizure, nontraumatic (Age >= 104y) EXAM: Adam HEAD WITHOUT CONTRAST TECHNIQUE: Contiguous axial images were obtained from the base of the skull through the vertex without intravenous contrast. COMPARISON:  06/07/2020 and prior. FINDINGS: Brain: Known hemorrhagic left cerebellar mass is obscured by adjacent beam hardening/streak artifact. This also limits evaluation of left cerebellar Bass and fourth ventricular  effacement. Redemonstration of right parietal hemorrhagic and necrotic mass measuring 4.1 by 3.4 by 4.1 cm. Associated perilesional Bass is unchanged. No new focal hypodensity. No significant midline shift. Stable appearance of the ventricular system. No extra-axial fluid collection. Vascular: No hyperdense vessel or unexpected calcification. Skull: Negative for fracture or focal lesion. Sinuses/Orbits: Normal orbits. Clear paranasal sinuses. No mastoid effusion. Other: Motion degraded exam. IMPRESSION: Right parietal hemorrhagic/necrotic mass with perilesional Bass is unchanged. Poor visualization of left cerebellar mass secondary to beam hardening/streak artifact. MRI head with and without contrast is recommended for better evaluation. Electronically Signed   By: Primitivo Gauze M.D.   On: 06/11/2020 17:46   Adam Head Wo Contrast  Result Date: 06/07/2020 CLINICAL DATA:  History of non-small cell lung cancer. Acute presentation with left-sided Bass. EXAM: Adam HEAD WITHOUT CONTRAST TECHNIQUE: Contiguous axial images were obtained from the base of the skull through the vertex without intravenous contrast. COMPARISON:  MRI 04/30/2017 FINDINGS: Brain: 4 cm left cerebellar mass with necrosis and hemorrhage. Mass effect with flattening of the fourth ventricle. Second 4 cm hemorrhagic necrotic mass at the right parietal vertex with moderate surrounding Bass. Both of these are consistent with intracranial metastatic disease. Mild chronic small-vessel ischemic change seen elsewhere within the cerebral hemispheric white matter. No sign of ischemic infarction. No apparent hydrocephalus of the lateral or third ventricles. No extra-axial fluid collection. Vascular: There is atherosclerotic calcification of the major vessels at  the base of the brain. Skull: Negative Sinuses/Orbits: Clear/normal Other: None IMPRESSION: 4 cm left cerebellar mass with necrosis and hemorrhage. 4 cm necrotic and hemorrhagic mass at the  right parietal vertex with moderate surrounding Bass. Findings consistent with intracranial metastatic disease. Some mass-effect upon the fourth ventricle but no sign of obstructive hydrocephalus at this moment. Electronically Signed   By: Nelson Chimes M.D.   On: 06/07/2020 16:29   DG CHEST PORT 1 VIEW  Result Date: 06/11/2020 CLINICAL DATA:  Low O2 sat EXAM: PORTABLE CHEST 1 VIEW COMPARISON:  February 29, 2020 FINDINGS: The heart size and mediastinal contours are within normal limits. Aortic knob calcifications. Hyperinflation of the upper lung zones are seen. The visualized skeletal structures are unremarkable. IMPRESSION: No active disease.  Findings of COPD. Electronically Signed   By: Prudencio Pair M.D.   On: 06/11/2020 19:31    Micro Results   Recent Results (from the past 240 hour(s))  Resp Panel by RT-PCR (Flu A&B, Covid) Nasopharyngeal Swab     Status: None   Collection Time: 06/07/20  4:18 PM   Specimen: Nasopharyngeal Swab; Nasopharyngeal(NP) swabs in vial transport medium  Result Value Ref Range Status   SARS Coronavirus 2 by RT PCR NEGATIVE NEGATIVE Final    Comment: (NOTE) SARS-CoV-2 target nucleic acids are NOT DETECTED.  The SARS-CoV-2 RNA is generally detectable in upper respiratory specimens during the acute phase of infection. The lowest concentration of SARS-CoV-2 viral copies this assay can detect is 138 copies/mL. A negative result does not preclude SARS-Cov-2 infection and should not be used as the sole basis for treatment or other patient management decisions. A negative result may occur with  improper specimen collection/handling, submission of specimen other than nasopharyngeal swab, presence of viral mutation(s) within the areas targeted by this assay, and inadequate number of viral copies(<138 copies/mL). A negative result must be combined with clinical observations, patient history, and epidemiological information. The expected result is Negative.  Fact  Sheet for Patients:  EntrepreneurPulse.com.au  Fact Sheet for Healthcare Providers:  IncredibleEmployment.be  This test is no t yet approved or cleared by the [place] FDA and  has been authorized for detection and/or diagnosis of SARS-CoV-2 by FDA under an Emergency Use Authorization (EUA). This EUA will remain  in effect (meaning this test can be used) for the duration of the COVID-19 declaration under Section 564(b)(1) of the Act, 21 U.S.C.section 360bbb-3(b)(1), unless the authorization is terminated  or revoked sooner.       Influenza A by PCR NEGATIVE NEGATIVE Final   Influenza B by PCR NEGATIVE NEGATIVE Final    Comment: (NOTE) The Xpert Xpress SARS-CoV-2/FLU/RSV plus assay is intended as an aid in the diagnosis of influenza from Nasopharyngeal swab specimens and should not be used as a sole basis for treatment. Nasal washings and aspirates are unacceptable for Xpert Xpress SARS-CoV-2/FLU/RSV testing.  Fact Sheet for Patients: EntrepreneurPulse.com.au  Fact Sheet for Healthcare Providers: IncredibleEmployment.be  This test is not yet approved or cleared by the [place] FDA and has been authorized for detection and/or diagnosis of SARS-CoV-2 by FDA under an Emergency Use Authorization (EUA). This EUA will remain in effect (meaning this test can be used) for the duration of the COVID-19 declaration under Section 564(b)(1) of the Act, 21 U.S.C. section 360bbb-3(b)(1), unless the authorization is terminated or revoked.  Performed at [facility], [address], [place], [place] [address]   MRSA PCR Screening     Status: None   Collection  Time: 06/07/20  9:30 PM   Specimen: Nasal Mucosa; Nasopharyngeal  Result Value Ref Range Status   MRSA by PCR NEGATIVE NEGATIVE Final    Comment:        The GeneXpert MRSA Assay (FDA approved for NASAL specimens only), is one component of  a comprehensive MRSA colonization surveillance program. It is not intended to diagnose MRSA infection nor to guide or monitor treatment for MRSA infections. Performed at [facility], [place] [address] [place], [place] [address]   Resp Panel by RT-PCR (Flu A&B, Covid) Nasopharyngeal Swab     Status: None   Collection Time: 06/11/20  8:25 PM   Specimen: Nasopharyngeal Swab; Nasopharyngeal(NP) swabs in vial transport medium  Result Value Ref Range Status   SARS Coronavirus 2 by RT PCR NEGATIVE NEGATIVE Final    Comment: (NOTE) SARS-CoV-2 target nucleic acids are NOT DETECTED.  The SARS-CoV-2 RNA is generally detectable in upper respiratory specimens during the acute phase of infection. The lowest concentration of SARS-CoV-2 viral copies this assay can detect is 138 copies/mL. A negative result does not preclude SARS-Cov-2 infection and should not be used as the sole basis for treatment or other patient management decisions. A negative result may occur with  improper specimen collection/handling, submission of specimen other than nasopharyngeal swab, presence of viral mutation(s) within the areas targeted by this assay, and inadequate number of viral copies(<138 copies/mL). A negative result must be combined with clinical observations, patient history, and epidemiological information. The expected result is Negative.  Fact Sheet for Patients:  EntrepreneurPulse.com.au  Fact Sheet for Healthcare Providers:  IncredibleEmployment.be  This test is no t yet approved or cleared by the [place] FDA and  has been authorized for detection and/or diagnosis of SARS-CoV-2 by FDA under an Emergency Use Authorization (EUA). This EUA will remain  in effect (meaning this test can be used) for the duration of the COVID-19 declaration under Section 564(b)(1) of the Act, 21 U.S.C.section 360bbb-3(b)(1), unless the authorization is terminated  or revoked  sooner.       Influenza A by PCR NEGATIVE NEGATIVE Final   Influenza B by PCR NEGATIVE NEGATIVE Final    Comment: (NOTE) The Xpert Xpress SARS-CoV-2/FLU/RSV plus assay is intended as an aid in the diagnosis of influenza from Nasopharyngeal swab specimens and should not be used as a sole basis for treatment. Nasal washings and aspirates are unacceptable for Xpert Xpress SARS-CoV-2/FLU/RSV testing.  Fact Sheet for Patients: EntrepreneurPulse.com.au  Fact Sheet for Healthcare Providers: IncredibleEmployment.be  This test is not yet approved or cleared by the [place] FDA and has been authorized for detection and/or diagnosis of SARS-CoV-2 by FDA under an Emergency Use Authorization (EUA). This EUA will remain in effect (meaning this test can be used) for the duration of the COVID-19 declaration under Section 564(b)(1) of the Act, 21 U.S.C. section 360bbb-3(b)(1), unless the authorization is terminated or revoked.  Performed at [facility], [address], [place], [place] [address]   MRSA PCR Screening     Status: None   Collection Time: 06/12/20  1:28 AM   Specimen: Nasal Mucosa; Nasopharyngeal  Result Value Ref Range Status   MRSA by PCR NEGATIVE NEGATIVE Final    Comment:        The GeneXpert MRSA Assay (FDA approved for NASAL specimens only), is one component of a comprehensive MRSA colonization surveillance program. It is not intended to diagnose MRSA infection nor to guide or monitor treatment for MRSA infections. Performed at  [facility], [address], [place], [place] [address]        Today   Subjective    Adam Bass today has no new complaints-  -Transfer to residential hospice house with full comfort care          Patient has been seen and examined prior to discharge   Objective   Blood pressure (!) 156/69, pulse (!) 101, temperature 99.1 F (37.3 C), temperature source Axillary, resp. rate 18,  height 5\' 9"  (1.753 m), weight 58.8 kg, SpO2 94 %.   Intake/Output Summary (Last 24 hours) at 06/12/2020 1559 Last data filed at 06/12/2020 1515 Gross per 24 hour  Intake 1621.56 ml  Output -  Net 1621.56 ml    Exam Gen:- Awake Alert, no acute distress  HEENT:- [place].AT, No sclera icterus Nose- [place]ck-Supple Neck,No JVD,.  Lungs-diminished breath sounds, no wheezing  CV- S1, S2 normal, regular Abd-  +ve B.Sounds, Abd Soft, No tenderness,    Extremity/Skin:- No  Bass,   good pulses Psych-affect is flat, oriented x3 Neuro-generalized Bass ,no new focal deficits, no tremors    Data Review   CBC w Diff:  Lab Results  Component Value Date   WBC 17.4 (H) 06/12/2020   HGB 14.1 06/12/2020   HGB 13.6 05/11/2020   HCT 42.4 06/12/2020   PLT 219 06/12/2020   PLT 280 05/11/2020   LYMPHOPCT 2 06/11/2020   MONOPCT 10 06/11/2020   EOSPCT 0 06/11/2020   BASOPCT 0 06/11/2020    CMP:  Lab Results  Component Value Date   NA 135 06/12/2020   NA 139 12/09/2017   K 4.2 06/12/2020   CL 95 (L) 06/12/2020   CO2 29 06/12/2020   BUN 29 (H) 06/12/2020   BUN 16 12/09/2017   CREATININE 0.46 (L) 06/12/2020   CREATININE 0.70 05/11/2020   CREATININE 0.58 (L) 01/04/2020   PROT 6.4 (L) 06/12/2020   PROT 6.9 01/01/2018   ALBUMIN 3.4 (L) 06/12/2020   ALBUMIN 3.9 01/01/2018   BILITOT 0.9 06/12/2020   BILITOT 0.6 05/11/2020   ALKPHOS 77 06/12/2020   AST 37 06/12/2020   AST 37 05/11/2020   ALT 30 06/12/2020   ALT 32 05/11/2020  .   Total Discharge time is about 33 minutes  Roxan Hockey M.D on 06/12/2020 at 3:59 PM  Go to [email or web address] -  for contact info  [facility] - Office  [phone]

## 2020-06-12 NOTE — ED Notes (Signed)
Pt continues to yell out. When entering the room pt states he wants [place] to call Melissa, he wants to go needs to go home states the pt.  Tried to explain to the pt that he was at [facility] ( he states he is not) tried to explain as the nurse did earlier that they are keeping him in the hospital.

## 2020-06-12 NOTE — Progress Notes (Signed)
Patient BP elevated 183/98. MD notified. Awaiting orders/response

## 2020-06-12 NOTE — Progress Notes (Signed)
EEG Completed; Results Pending  

## 2020-06-12 NOTE — TOC Transition Note (Signed)
Transition of Care [facility]) - CM/SW Discharge Note   Patient Details  Name: Adam Bass MRN: [id number] Date of Birth: 11-26-1950  Transition of Care [facility]) CM/SW Contact:  Adam Arnt, Adam Bass Phone Number: 06/12/2020, 3:38 PM   Clinical Narrative:  Pt d/c yesterday from [facility]. Hospice was on their way to sign admission paperwork at pt's home when he had a seizure. Pt was not admitted under hospice prior to coming to [facility]. Adam Bass met with pt and pt's daughter, Adam Bass who request that pt go to [facility]. Per MD, pt is appropriate. Referral made and bed is available today. COVID negative 2/27. RN given number to call report. Pt will transport via [facility]. Will send d/c summary when completed.        Final next level of care: [place] Barriers to Discharge: Barriers Resolved   Patient Goals and CMS Choice     Choice offered to / list presented to : [place]  Discharge Placement              Patient chooses bed at: Other - please specify in the comment section below: [facility]) Patient to be transferred to facility by: [facility] Name of family member notified: Adam Bass Patient and family notified of of transfer: 06/12/20  Discharge Plan and Services                DME Arranged: N/A                    Social Determinants of Health (SDOH) Interventions     Readmission Risk Interventions No flowsheet data found.

## 2020-06-12 NOTE — Discharge Instructions (Signed)
-  Transfer to residential hospice house with full comfort care

## 2020-06-13 ENCOUNTER — Other Ambulatory Visit: Payer: Self-pay | Admitting: Cardiology

## 2020-06-13 NOTE — Procedures (Signed)
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 EEG to evaluate for seizure.  Level of alertness: Awake  AEDs during EEG study: Keppra  Technical aspects: This EEG study was done with scalp electrodes positioned according to the 10-20 International system of electrode placement. Electrical activity was acquired at a sampling rate of 500Hz  and reviewed with a high frequency filter of 70Hz  and a low frequency filter of 1Hz . EEG data were recorded continuously and digitally stored.   Description: The posterior dominant rhythm consists of 7.5 Hz activity of moderate voltage (25-35 uV) seen predominantly in posterior head regions, symmetric and reactive to eye opening and eye closing.  EEG showed intermittent generalized 5 to 6 Hz theta slowing.  Hyperventilation and photic stimulation were not performed.     ABNORMALITY -Intermittent slow, generalized  IMPRESSION: This study is suggestive of mild diffuse encephalopathy, nonspecific etiology. No seizures or epileptiform discharges were seen throughout the recording.  Bryann Gentz Barbra Sarks

## 2020-06-14 ENCOUNTER — Telehealth: Payer: Self-pay

## 2020-06-14 NOTE — Telephone Encounter (Signed)
Contacted patient's daughter Lenna Sciara to inquire about patient's status and see if there was anything our team could help/provide. Melissa stated that after patient was discharged from [facility], he had another seizure at home, was readmitted to the hospital, and then discharged to [facility]. She stated that patient and family would not need upcoming F/U appointment with radiation oncology because of patient's rapidly declining condition. She expressed appreciation of call, and care that her father received from Dr. Isidore Moos. She denied any other needs at this time, but knows to call clinic back should patient have any future needs we can help with.

## 2020-06-15 ENCOUNTER — Ambulatory Visit (HOSPITAL_COMMUNITY): Payer: Medicare Other

## 2020-06-20 ENCOUNTER — Ambulatory Visit (HOSPITAL_COMMUNITY): Payer: Medicare Other | Admitting: Hematology

## 2020-06-27 ENCOUNTER — Ambulatory Visit: Payer: Self-pay | Admitting: Radiation Oncology

## 2020-06-30 NOTE — Progress Notes (Signed)
  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, left bronchus or lung  Cancer Staging: Cancer Staging NSCLC of left lung [facility]) Staging form: Lung, AJCC 8th Edition - Clinical stage from 03/15/2020: Stage IVA (cT2, cN2, cM1a) - Signed by Truitt Merle, MD on 03/15/2020 Stage prefix: Initial diagnosis  Primary cancer of left lower lobe of lung ([place]) Staging form: Lung, AJCC 8th Edition - Clinical stage from 04/30/2017: Stage IA2 (cT1b, cN0, cM0) - Signed by Eppie Gibson, MD on 04/30/2017   Intent: Curative  Radiation Treatment Dates: 03/28/2020 through 05/16/2020 Site Technique Total Dose (Gy) Dose per Fx (Gy) Completed Fx Beam Energies  Lung, Left: [id number]_and_LN IMRT 66/66 2 33/33 6X   Narrative: The patient tolerated radiation therapy relatively well.   Plan: The patient will follow-up with radiation oncology in 12mo. -----------------------------------  Eppie Gibson, MD

## 2020-07-04 ENCOUNTER — Ambulatory Visit (INDEPENDENT_AMBULATORY_CARE_PROVIDER_SITE_OTHER): Payer: Medicare Other | Admitting: Internal Medicine

## 2020-07-14 DEATH — deceased

## 2020-10-16 ENCOUNTER — Other Ambulatory Visit: Payer: Self-pay | Admitting: Internal Medicine

## 2020-10-17 ENCOUNTER — Other Ambulatory Visit: Payer: Self-pay | Admitting: Internal Medicine
